# Patient Record
Sex: Female | Born: 1955 | Race: White | Hispanic: Yes | Marital: Married | Smoking: Never smoker
Health system: Southern US, Community
[De-identification: ages and names within clinical notes are randomized; demographics above are authoritative.]

## PROBLEM LIST (undated history)

## (undated) DIAGNOSIS — F419 Anxiety disorder, unspecified: Secondary | ICD-10-CM

## (undated) DIAGNOSIS — E785 Hyperlipidemia, unspecified: Secondary | ICD-10-CM

## (undated) DIAGNOSIS — D649 Anemia, unspecified: Secondary | ICD-10-CM

## (undated) DIAGNOSIS — R112 Nausea with vomiting, unspecified: Secondary | ICD-10-CM

## (undated) DIAGNOSIS — Z9889 Other specified postprocedural states: Secondary | ICD-10-CM

## (undated) DIAGNOSIS — Z9289 Personal history of other medical treatment: Secondary | ICD-10-CM

## (undated) DIAGNOSIS — J189 Pneumonia, unspecified organism: Secondary | ICD-10-CM

## (undated) DIAGNOSIS — M1990 Unspecified osteoarthritis, unspecified site: Secondary | ICD-10-CM

## (undated) DIAGNOSIS — R011 Cardiac murmur, unspecified: Secondary | ICD-10-CM

## (undated) DIAGNOSIS — I10 Essential (primary) hypertension: Secondary | ICD-10-CM

## (undated) DIAGNOSIS — N289 Disorder of kidney and ureter, unspecified: Secondary | ICD-10-CM

## (undated) DIAGNOSIS — E119 Type 2 diabetes mellitus without complications: Secondary | ICD-10-CM

## (undated) HISTORY — PX: BLADDER REPAIR: SHX76

## (undated) HISTORY — PX: CHOLECYSTECTOMY: SHX55

## (undated) HISTORY — DX: Type 2 diabetes mellitus without complications: E11.9

## (undated) HISTORY — DX: Hyperlipidemia, unspecified: E78.5

## (undated) HISTORY — PX: VAGINAL HYSTERECTOMY: SHX2639

## (undated) HISTORY — DX: Essential (primary) hypertension: I10

## (undated) HISTORY — DX: Anemia, unspecified: D64.9

## (undated) HISTORY — DX: Anxiety disorder, unspecified: F41.9

---

## 2018-01-03 ENCOUNTER — Encounter: Payer: Self-pay | Admitting: *Deleted

## 2018-01-04 ENCOUNTER — Encounter: Payer: Self-pay | Admitting: Emergency Medicine

## 2018-01-04 ENCOUNTER — Other Ambulatory Visit: Payer: Self-pay

## 2018-01-04 ENCOUNTER — Ambulatory Visit: Payer: Self-pay | Admitting: Emergency Medicine

## 2018-01-04 VITALS — BP 130/60 | HR 76

## 2018-01-04 DIAGNOSIS — R6889 Other general symptoms and signs: Secondary | ICD-10-CM

## 2018-01-04 DIAGNOSIS — E119 Type 2 diabetes mellitus without complications: Secondary | ICD-10-CM

## 2018-01-04 DIAGNOSIS — E118 Type 2 diabetes mellitus with unspecified complications: Secondary | ICD-10-CM

## 2018-01-04 HISTORY — DX: Type 2 diabetes mellitus without complications: E11.9

## 2018-01-04 LAB — POCT CBC
GRANULOCYTE PERCENT: 86.2 % — AB (ref 37–80)
HEMATOCRIT: 35.4 % — AB (ref 37.7–47.9)
Hemoglobin: 11.2 g/dL — AB (ref 12.2–16.2)
Lymph, poc: 0.9 (ref 0.6–3.4)
MCH: 26.7 pg — AB (ref 27–31.2)
MCHC: 31.8 g/dL (ref 31.8–35.4)
MCV: 84 fL (ref 80–97)
MID (cbc): 0.2 (ref 0–0.9)
MPV: 8.8 fL (ref 0–99.8)
POC GRANULOCYTE: 7.2 — AB (ref 2–6.9)
POC LYMPH %: 11.1 % (ref 10–50)
POC MID %: 2.7 %M (ref 0–12)
Platelet Count, POC: 216 10*3/uL (ref 142–424)
RBC: 4.21 M/uL (ref 4.04–5.48)
RDW, POC: 12.6 %
WBC: 8.3 10*3/uL (ref 4.6–10.2)

## 2018-01-04 LAB — COMPREHENSIVE METABOLIC PANEL
ALBUMIN: 3.6 g/dL (ref 3.6–4.8)
ALK PHOS: 96 IU/L (ref 39–117)
ALT: 11 IU/L (ref 0–32)
AST: 13 IU/L (ref 0–40)
Albumin/Globulin Ratio: 1.3 (ref 1.2–2.2)
BUN / CREAT RATIO: 35 — AB (ref 12–28)
BUN: 31 mg/dL — AB (ref 8–27)
CHLORIDE: 105 mmol/L (ref 96–106)
CO2: 17 mmol/L — ABNORMAL LOW (ref 20–29)
Calcium: 8.4 mg/dL — ABNORMAL LOW (ref 8.7–10.3)
Creatinine, Ser: 0.88 mg/dL (ref 0.57–1.00)
GFR calc Af Amer: 81 mL/min/{1.73_m2} (ref >59)
GFR calc non Af Amer: 71 mL/min/{1.73_m2} (ref >59)
GLUCOSE: 251 mg/dL — AB (ref 65–99)
Globulin, Total: 2.8 g/dL (ref 1.5–4.5)
Potassium: 4.8 mmol/L (ref 3.5–5.2)
SODIUM: 137 mmol/L (ref 134–144)
Total Protein: 6.4 g/dL (ref 6.0–8.5)

## 2018-01-04 LAB — POCT GLYCOSYLATED HEMOGLOBIN (HGB A1C): Hemoglobin A1C: 8.8 % — AB (ref 4.0–5.6)

## 2018-01-04 LAB — GLUCOSE, POCT (MANUAL RESULT ENTRY): POC Glucose: 242 mg/dl — AB (ref 70–99)

## 2018-01-04 MED ORDER — GLIPIZIDE 5 MG PO TABS: 5.0000 mg | ORAL_TABLET | Freq: Every day | ORAL | 2 refills | Status: DC

## 2018-01-04 NOTE — Progress Notes (Signed)
Rachael Burke 62 y.o.   Chief Complaint  Patient presents with  . Eye Problem    x 6 days with itching of both  . Establish Care    HISTORY OF PRESENT ILLNESS: This is a 62 y.o. female complaining of intermittent itching of both eyes for the past 2 years but worse in the past 2 months.  Visual acuity also has been deteriorating.  Patient is diabetic and has been on metformin for 12 years.  Follow-up with doctors in [place] always tell her that her diabetes is out of control.  Patient's capillary blood glucose at home has been between 200-400's.  Denies any other significant symptoms.  HPI   Prior to Admission medications   Medication Sig Start Date End Date Taking? Authorizing Provider  metFORMIN (GLUCOPHAGE) 500 MG tablet Take by mouth 2 (two) times daily with a meal.   Yes [provider]    No Known Allergies  There are no active problems to display for this patient.   No past medical history on file.    Social History   Socioeconomic History  . Marital status: Married    Spouse name: Not on file  . Number of children: Not on file  . Years of education: Not on file  . Highest education level: Not on file  Occupational History  . Not on file  Social Needs  . Financial resource strain: Not on file  . Food insecurity:    Worry: Not on file    Inability: Not on file  . Transportation needs:    Medical: Not on file    Non-medical: Not on file  Tobacco Use  . Smoking status: Not on file  Substance and Sexual Activity  . Alcohol use: Not on file  . Drug use: Not on file  . Sexual activity: Not on file  Lifestyle  . Physical activity:    Days per week: Not on file    Minutes per session: Not on file  . Stress: Not on file  Relationships  . Social connections:    Talks on phone: Not on file    Gets together: Not on file    Attends religious service: Not on file    Active member of club or organization: Not on file    Attends meetings of clubs or  organizations: Not on file    Relationship status: Not on file  . Intimate partner violence:    Fear of current or ex partner: Not on file    Emotionally abused: Not on file    Physically abused: Not on file    Forced sexual activity: Not on file  Other Topics Concern  . Not on file  Social History Narrative  . Not on file    No family history on file.   Review of Systems  Constitutional: Negative.  Negative for chills and fever.  HENT: Negative.  Negative for sore throat.   Eyes: Positive for redness. Negative for photophobia, pain and discharge.       Itchy eyes  Respiratory: Negative.  Negative for cough and shortness of breath.   Cardiovascular: Negative.  Negative for chest pain and palpitations.  Gastrointestinal: Negative for abdominal pain, nausea and vomiting.  Genitourinary: Negative.  Negative for dysuria and hematuria.  Musculoskeletal: Negative for back pain, myalgias and neck pain.  Skin: Negative.  Negative for rash.  Neurological: Negative.   Endo/Heme/Allergies: Negative.   All other systems reviewed and are negative.  Vitals:   01/04/18 0951  BP: 130/60  Pulse: 76     Physical Exam  Constitutional: She is oriented to person, place, and time. She appears well-developed and well-nourished.  HENT:  Head: Normocephalic and atraumatic.  Nose: Nose normal.  Mouth/Throat: Oropharynx is clear and moist.  Eyes: Pupils are equal, round, and reactive to light. Conjunctivae and EOM are normal.  Neck: Normal range of motion. Neck supple. No thyromegaly present.  Cardiovascular: Normal rate, regular rhythm and normal heart sounds.  Pulmonary/Chest: Effort normal.  Abdominal: Soft. Bowel sounds are normal. She exhibits no distension. There is no tenderness.  Musculoskeletal: Normal range of motion. She exhibits no edema.  Lymphadenopathy:    She has no cervical adenopathy.  Neurological: She is alert and oriented to person, place, and time. No sensory  deficit. She exhibits normal muscle tone.  Skin: Skin is warm and dry. Capillary refill takes less than 2 seconds.  Psychiatric: She has a normal mood and affect. Her behavior is normal.  Vitals reviewed.   Results for orders placed or performed in visit on 01/04/18 (from the past 24 hour(s))  POCT glucose (manual entry)     Status: Abnormal   Collection Time: 01/04/18 10:42 AM  Result Value Ref Range   POC Glucose 242 (A) 70 - 99 mg/dl  POCT CBC     Status: Abnormal   Collection Time: 01/04/18 10:42 AM  Result Value Ref Range   WBC 8.3 4.6 - 10.2 K/uL   Lymph, poc 0.9 0.6 - 3.4   POC LYMPH PERCENT 11.1 10 - 50 %L   MID (cbc) 0.2 0 - 0.9   POC MID % 2.7 0 - 12 %M   POC Granulocyte 7.2 (A) 2 - 6.9   Granulocyte percent 86.2 (A) 37 - 80 %G   RBC 4.21 4.04 - 5.48 M/uL   Hemoglobin 11.2 (A) 12.2 - 16.2 g/dL   HCT, POC 35.4 (A) 37.7 - 47.9 %   MCV 84.0 80 - 97 fL   MCH, POC 26.7 (A) 27 - 31.2 pg   MCHC 31.8 31.8 - 35.4 g/dL   RDW, POC 12.6 %   Platelet Count, POC 216 142 - 424 K/uL   MPV 8.8 0 - 99.8 fL  POCT glycosylated hemoglobin (Hb A1C)     Status: Abnormal   Collection Time: 01/04/18 10:46 AM  Result Value Ref Range   Hemoglobin A1C 8.8 (A) 4.0 - 5.6 %   HbA1c POC (<> result, manual entry)  4.0 - 5.6 %   HbA1c, POC (prediabetic range)  5.7 - 6.4 %   HbA1c, POC (controlled diabetic range)  0.0 - 7.0 %   Type 2 diabetes mellitus with complication, without long-term current use of insulin (HCC) Uncontrolled diabetes with elevated hemoglobin A1c at 8.8.  Visual symptoms/problems may be related to this.  Patient taking metformin twice a day.  Will add glipizide 5 mg at breakfast with food.  Advised about hypoglycemic symptoms.  Will follow-up in 4 weeks.   ASSESSMENT & PLAN: Kennisha was seen today for eye problem and establish care.  Diagnoses and all orders for this visit:  Type 2 diabetes mellitus with complication, without long-term current use of insulin (HCC) -     POCT  glucose (manual entry) -     POCT glycosylated hemoglobin (Hb A1C) -     POCT CBC -     Comprehensive metabolic panel -     glipiZIDE (GLUCOTROL) 5 MG tablet; Take 1  tablet (5 mg total) by mouth daily before breakfast.  Itchy eyes    Patient Instructions       IF you received an x-ray today, you will receive an invoice from [facility]. Please contact [facility] Radiology at [phone] with questions or concerns regarding your invoice.   IF you received labwork today, you will receive an invoice from [place]. Please contact [facility] at [phone] with questions or concerns regarding your invoice.   Our billing staff will not be able to assist you with questions regarding bills from these companies.  You will be contacted with the lab results as soon as they are available. The fastest way to get your results is to activate your My Chart account. Instructions are located on the last page of this paperwork. If you have not heard from [place] regarding the results in 2 weeks, please contact this office.     Diabetes mellitus y nutricin Diabetes Mellitus and Nutrition Si sufre de diabetes (diabetes mellitus), es muy importante tener hbitos alimenticios saludables debido a que sus niveles de Designer, television/film set sangre (glucosa) se ven afectados en gran medida por lo que come y bebe. [place] alimentos saludables en las cantidades [place], aproximadamente a la [employer], [place] ayudar a:  Aeronautical engineer glucemia.  Disminuir el riesgo de sufrir una enfermedad cardaca.  Mejorar la presin arterial.  Science writer o mantener un peso saludable.  Todas las personas que sufren de diabetes son diferentes y cada una tiene necesidades diferentes en cuanto a un plan de alimentacin. El mdico puede recomendarle que trabaje con un especialista en dietas y nutricin (nutricionista) para Financial trader plan para usted. Su plan de alimentacin puede variar segn factores como:  Las  caloras que necesita.  Los medicamentos que toma.  Su peso.  Sus niveles de glucemia, presin arterial y colesterol.  Su nivel de [place].  Otras afecciones que tenga, como enfermedades cardacas o renales.  Cmo me afectan los carbohidratos? Los carbohidratos afectan el nivel de glucemia ms que cualquier otro tipo de alimento. La ingesta de carbohidratos naturalmente aumenta la cantidad glucosa en la sangre. El recuento de carbohidratos es un mtodo destinado a Catering manager un registro de la cantidad de carbohidratos que se ingieren. El recuento de carbohidratos es importante para Theatre manager la glucemia a un nivel saludable, en especial si utiliza insulina o toma determinados medicamentos por va oral para la diabetes. Es importante saber la cantidad de carbohidratos que se pueden ingerir en cada comida sin correr Engineer, manufacturing. Esto es Psychologist, forensic. El nutricionista puede ayudarlo a calcular la cantidad de carbohidratos que debe ingerir en cada comida y colacin. Los alimentos que contienen carbohidratos incluyen:  Pan, cereal, arroz, pasta y galletas.  Papas y maz.  Guisantes, frijoles y lentejas.  Leche y Estate agent.  Lambert Mody y [place].  Postres, como pasteles, galletitas, helado y caramelos.  Cmo me afecta el alcohol? El alcohol puede provocar disminuciones sbitas de la glucemia (hipoglucemia), en especial si utiliza insulina o toma determinados medicamentos por va oral para la diabetes. La hipoglucemia es una afeccin potencialmente mortal. Los sntomas de la hipoglucemia (somnolencia, mareos y confusin) son similares a los sntomas de haber consumido demasiado alcohol. Si el mdico afirma que el alcohol es seguro para usted, siga estas pautas:  Limite el consumo de alcohol a no ms de 1 medida por da si es mujer y no est Music therapist, y a 2 medidas si es hombre. Una medida equivale a 12oz (382ml) de  cerveza, 5oz (130ml) de vino o 1oz (27ml) de bebidas de alta  graduacin alcohlica.  No beba con el estmago vaco.  Mantngase hidratado con agua, gaseosas dietticas o t helado sin azcar.  Tenga en cuenta que las gaseosas comunes, los jugos y otros refrescos pueden contener mucha azcar y se deben contar como carbohidratos.  Consejos para seguir Photographer las etiquetas de los alimentos  Comience por controlar el tamao de la porcin en la etiqueta. La cantidad de caloras, carbohidratos, grasas y otros nutrientes mencionados en la etiqueta se basan en una porcin del alimento. Muchos alimentos contienen ms de una porcin por envase.  Verifique la cantidad total de gramos (g) de carbohidratos totales en una porcin. Puede calcular la cantidad de porciones de carbohidratos al dividir el total de carbohidratos por 15. Por ejemplo, si un alimento posee un total de 30g de carbohidratos, equivale a 2 porciones de carbohidratos.  Verifique la cantidad de gramos (g) de grasas saturadas y grasas trans en una porcin. Escoja alimentos que no contengan grasa o que tengan un bajo contenido.  Controle la cantidad de miligramos (mg) de sodio en una porcin. La mayora de las personas deben limitar la ingesta de sodio total a menos de 2300mg  por Training and development officer.  Siempre consulte la informacin nutricional de los alimentos etiquetados como "con bajo contenido de grasa" o "sin grasa". Estos alimentos pueden ser ms altos en azcar agregada o en carbohidratos refinados y deben evitarse.  Hable con el nutricionista para identificar sus objetivos diarios en cuanto a los nutrientes mencionados en la etiqueta. De compras  Evite comprar alimentos procesados, enlatados o prehechos. Estos alimentos tienden a TEFL teacher cantidad de [place], sodio y azcar agregada.  Compre en la zona exterior de la tienda de comestibles. Esta incluye frutas y [employer], granos a granel, carnes frescas y productos lcteos frescos. Coccin  Utilice mtodos de coccin a baja  temperatura, como hornear, en lugar de mtodos de coccin a alta temperatura, como frer en abundante aceite.  Cocine con aceites saludables, como el aceite de [place], canola o [place].  Evite cocinar con manteca, crema o carnes con alto contenido de grasa. Planificacin de las comidas  [employer] comidas y las colaciones de forma regular, preferentemente a la misma hora todos [place]. Evite pasar largos perodos de tiempo sin comer.  Consuma alimentos ricos en fibra, como frutas frescas, verduras, frijoles y cereales integrales. Consulte al nutricionista sobre cuntas porciones de carbohidratos puede consumir en cada comida.  Consuma entre 4 y 6 onzas de protenas magras por da, como carnes [place], pollo, pescado, [employer] o tofu. 1 onza equivale a 1 onza de carne, pollo o pescado, 1 huevo, o 1/4 taza de tofu.  Coma algunos alimentos por da que contengan grasas saludables, como aguacates, frutos secos, semillas y pescado. Estilo de vida   Controle su nivel de glucemia con regularidad.  Haga ejercicio al menos 63minutos, 5das o ms por semana, o como se lo haya indicado el mdico.  Tome los [employer] se lo haya indicado el mdico.  No consuma ningn producto que contenga nicotina o tabaco, como cigarrillos y Psychologist, sport and exercise. Si necesita ayuda para dejar de fumar, consulte al [employer] con un asesor o instructor en diabetes para identificar estrategias para controlar el estrs y cualquier desafo emocional y social. Cules son algunas de las preguntas que puedo hacerle a mi mdico?  Es necesario que me rena con Radio broadcast assistant en diabetes?  Es necesario que me  rena con un nutricionista?  A qu nmero puedo llamar si tengo preguntas?  Cules son los mejores momentos para controlar la glucemia? Dnde encontrar ms informacin:  Asociacin Americana de la Diabetes (American Diabetes Association): diabetes.org/food-and-fitness/food  Academia de  Nutricin y Information systems manager (Academy of Nutrition and Dietetics): PokerClues.dk  [place] Diabetes y [place] y Water quality scientist [facility] of Diabetes and Digestive and Kidney Diseases) ([place], NIH): ContactWire.be Resumen  Un plan de alimentacin saludable lo ayudar a Aeronautical engineer glucemia y Theatre manager un estilo de vida saludable.  Trabajar con un especialista en dietas y nutricin (nutricionista) puede ayudarlo a Insurance claims handler de alimentacin para usted.  Tenga en cuenta que los carbohidratos y el alcohol tienen efectos inmediatos en sus niveles de glucemia. Es importante contar los carbohidratos y consumir alcohol con prudencia. Esta informacin no tiene Marine scientist el consejo del mdico. Asegrese de hacerle al mdico cualquier pregunta que tenga. Document Released: 08/25/2007 Document Revised: 09/07/2016 Document Reviewed: 09/07/2016 Elsevier Interactive Patient Education  2018 Elsevier Inc.      Agustina Caroli, MD [facility]

## 2018-01-04 NOTE — Patient Instructions (Addendum)
IF you received an x-ray today, you will receive an invoice from [facility]iology. Please contact [facility] Radiology at [phone] with questions or concerns regarding your invoice.   IF you received labwork today, you will receive an invoice from [place]. Please contact [facility] at [phone] with questions or concerns regarding your invoice.   Our billing staff will not be able to assist you with questions regarding bills from these companies.  You will be contacted with the lab results as soon as they are available. The fastest way to get your results is to activate your My Chart account. Instructions are located on the last page of this paperwork. If you have not heard from [place] regarding the results in 2 weeks, please contact this office.     Diabetes mellitus y nutricin Diabetes Mellitus and Nutrition Si sufre de diabetes (diabetes mellitus), es muy importante tener hbitos alimenticios saludables debido a que sus niveles de Designer, television/film set sangre (glucosa) se ven afectados en gran medida por lo que come y bebe. [place] alimentos saludables en las cantidades [place], aproximadamente a la [employer], [place] ayudar a:  Aeronautical engineer glucemia.  Disminuir el riesgo de sufrir una enfermedad cardaca.  Mejorar la presin arterial.  Science writer o mantener un peso saludable.  Todas las personas que sufren de diabetes son diferentes y cada una tiene necesidades diferentes en cuanto a un plan de alimentacin. El mdico puede recomendarle que trabaje con un especialista en dietas y nutricin (nutricionista) para Financial trader plan para usted. Su plan de alimentacin puede variar segn factores como:  Las caloras que necesita.  Los medicamentos que toma.  Su peso.  Sus niveles de glucemia, presin arterial y colesterol.  Su nivel de [place].  Otras afecciones que tenga, como enfermedades cardacas o renales.  Cmo me afectan los carbohidratos? Los  carbohidratos afectan el nivel de glucemia ms que cualquier otro tipo de alimento. La ingesta de carbohidratos naturalmente aumenta la cantidad glucosa en la sangre. El recuento de carbohidratos es un mtodo destinado a Catering manager un registro de la cantidad de carbohidratos que se ingieren. El recuento de carbohidratos es importante para Theatre manager la glucemia a un nivel saludable, en especial si utiliza insulina o toma determinados medicamentos por va oral para la diabetes. Es importante saber la cantidad de carbohidratos que se pueden ingerir en cada comida sin correr Engineer, manufacturing. Esto es Psychologist, forensic. El nutricionista puede ayudarlo a calcular la cantidad de carbohidratos que debe ingerir en cada comida y colacin. Los alimentos que contienen carbohidratos incluyen:  Pan, cereal, arroz, pasta y galletas.  Papas y maz.  Guisantes, frijoles y lentejas.  Leche y Estate agent.  Lambert Mody y [place].  Postres, como pasteles, galletitas, helado y caramelos.  Cmo me afecta el alcohol? El alcohol puede provocar disminuciones sbitas de la glucemia (hipoglucemia), en especial si utiliza insulina o toma determinados medicamentos por va oral para la diabetes. La hipoglucemia es una afeccin potencialmente mortal. Los sntomas de la hipoglucemia (somnolencia, mareos y confusin) son similares a los sntomas de haber consumido demasiado alcohol. Si el mdico afirma que el alcohol es seguro para usted, siga estas pautas:  Limite el consumo de alcohol a no ms de 1 medida por da si es mujer y no est Music therapist, y a 2 medidas si es hombre. Una medida equivale a 12oz (368ml) de cerveza, 5oz (160ml) de vino o 1oz (33ml) de bebidas de alta graduacin alcohlica.  No beba con el estmago  vaco.  Mantngase hidratado con agua, gaseosas dietticas o t helado sin azcar.  Tenga en cuenta que las gaseosas comunes, los jugos y otros refrescos pueden contener mucha azcar y se deben contar como  carbohidratos.  Consejos para seguir Photographer las etiquetas de los alimentos  Comience por controlar el tamao de la porcin en la etiqueta. La cantidad de caloras, carbohidratos, grasas y otros nutrientes mencionados en la etiqueta se basan en una porcin del alimento. Muchos alimentos contienen ms de una porcin por envase.  Verifique la cantidad total de gramos (g) de carbohidratos totales en una porcin. Puede calcular la cantidad de porciones de carbohidratos al dividir el total de carbohidratos por 15. Por ejemplo, si un alimento posee un total de 30g de carbohidratos, equivale a 2 porciones de carbohidratos.  Verifique la cantidad de gramos (g) de grasas saturadas y grasas trans en una porcin. Escoja alimentos que no contengan grasa o que tengan un bajo contenido.  Controle la cantidad de miligramos (mg) de sodio en una porcin. La mayora de las personas deben limitar la ingesta de sodio total a menos de 2300mg  por Training and development officer.  Siempre consulte la informacin nutricional de los alimentos etiquetados como "con bajo contenido de grasa" o "sin grasa". Estos alimentos pueden ser ms altos en azcar agregada o en carbohidratos refinados y deben evitarse.  Hable con el nutricionista para identificar sus objetivos diarios en cuanto a los nutrientes mencionados en la etiqueta. De compras  Evite comprar alimentos procesados, enlatados o prehechos. Estos alimentos tienden a TEFL teacher cantidad de [place], sodio y azcar agregada.  Compre en la zona exterior de la tienda de comestibles. Esta incluye frutas y [employer], granos a granel, carnes frescas y productos lcteos frescos. Coccin  Utilice mtodos de coccin a baja temperatura, como hornear, en lugar de mtodos de coccin a alta temperatura, como frer en abundante aceite.  Cocine con aceites saludables, como el aceite de [place], [place] o [place].  Evite cocinar con manteca, crema o carnes con alto contenido de  grasa. Planificacin de las comidas  [employer] comidas y las colaciones de forma regular, preferentemente a la misma hora todos [place]. Evite pasar largos perodos de tiempo sin comer.  Consuma alimentos ricos en fibra, como frutas frescas, verduras, frijoles y cereales integrales. Consulte al nutricionista sobre cuntas porciones de carbohidratos puede consumir en cada comida.  Consuma entre 4 y 6 onzas de protenas magras por da, como carnes [place], pollo, pescado, [employer] o tofu. 1 onza equivale a 1 onza de carne, pollo o pescado, 1 huevo, o 1/4 taza de tofu.  Coma algunos alimentos por da que contengan grasas saludables, como aguacates, frutos secos, semillas y pescado. Estilo de vida   Controle su nivel de glucemia con regularidad.  Haga ejercicio al menos 73minutos, 5das o ms por semana, o como se lo haya indicado el mdico.  Tome los [employer] se lo haya indicado el mdico.  No consuma ningn producto que contenga nicotina o tabaco, como cigarrillos y Psychologist, sport and exercise. Si necesita ayuda para dejar de fumar, consulte al [employer] con un asesor o instructor en diabetes para identificar estrategias para controlar el estrs y cualquier desafo emocional y social. Cules son algunas de las preguntas que puedo hacerle a mi mdico?  Es necesario que me rena con Radio broadcast assistant en diabetes?  Es necesario que me rena con un nutricionista?  A qu nmero puedo llamar si tengo preguntas?  Cules son los mejores momentos para  controlar la glucemia? Dnde encontrar ms informacin:  Asociacin Americana de la Diabetes (American Diabetes Association): diabetes.org/food-and-fitness/food  Academia de Nutricin y Information systems manager (Academy of Nutrition and Dietetics): PokerClues.dk  [place] Diabetes y [place] y Water quality scientist [facility] of Diabetes and Digestive and  Kidney Diseases) ([place], NIH): ContactWire.be Resumen  Un plan de alimentacin saludable lo ayudar a Aeronautical engineer glucemia y Theatre manager un estilo de vida saludable.  Trabajar con un especialista en dietas y nutricin (nutricionista) puede ayudarlo a Insurance claims handler de alimentacin para usted.  Tenga en cuenta que los carbohidratos y el alcohol tienen efectos inmediatos en sus niveles de glucemia. Es importante contar los carbohidratos y consumir alcohol con prudencia. Esta informacin no tiene Marine scientist el consejo del mdico. Asegrese de hacerle al mdico cualquier pregunta que tenga. Document Released: 08/25/2007 Document Revised: 09/07/2016 Document Reviewed: 09/07/2016 Elsevier Interactive Patient Education  2018 [employer].

## 2018-01-04 NOTE — Assessment & Plan Note (Signed)
Uncontrolled diabetes with elevated hemoglobin A1c at 8.8.  Visual symptoms/problems may be related to this.  Patient taking metformin twice a day.  Will add glipizide 5 mg at breakfast with food.  Advised about hypoglycemic symptoms.  Will follow-up in 4 weeks.

## 2018-01-06 ENCOUNTER — Encounter: Payer: Self-pay | Admitting: *Deleted

## 2018-01-06 NOTE — Progress Notes (Signed)
Letter sent.

## 2018-02-09 ENCOUNTER — Ambulatory Visit: Payer: Self-pay | Admitting: Emergency Medicine

## 2019-04-06 ENCOUNTER — Ambulatory Visit (INDEPENDENT_AMBULATORY_CARE_PROVIDER_SITE_OTHER): Payer: Self-pay | Admitting: Emergency Medicine

## 2019-04-06 ENCOUNTER — Encounter: Payer: Self-pay | Admitting: Emergency Medicine

## 2019-04-06 ENCOUNTER — Other Ambulatory Visit: Payer: Self-pay

## 2019-04-06 VITALS — BP 164/74 | HR 71 | Temp 98.7°F | Resp 16 | Ht 63.0 in | Wt 148.0 lb

## 2019-04-06 DIAGNOSIS — I10 Essential (primary) hypertension: Secondary | ICD-10-CM

## 2019-04-06 DIAGNOSIS — H8111 Benign paroxysmal vertigo, right ear: Secondary | ICD-10-CM

## 2019-04-06 DIAGNOSIS — E1159 Type 2 diabetes mellitus with other circulatory complications: Secondary | ICD-10-CM

## 2019-04-06 DIAGNOSIS — I152 Hypertension secondary to endocrine disorders: Secondary | ICD-10-CM

## 2019-04-06 LAB — POCT GLYCOSYLATED HEMOGLOBIN (HGB A1C): Hemoglobin A1C: 6.7 % — AB (ref 4.0–5.6)

## 2019-04-06 LAB — GLUCOSE, POCT (MANUAL RESULT ENTRY): POC Glucose: 135 mg/dl — AB (ref 70–99)

## 2019-04-06 MED ORDER — ATORVASTATIN CALCIUM 20 MG PO TABS: 20.0000 mg | ORAL_TABLET | Freq: Every day | ORAL | 3 refills | Status: DC

## 2019-04-06 MED ORDER — LISINOPRIL 10 MG PO TABS: 10.0000 mg | ORAL_TABLET | Freq: Every day | ORAL | 3 refills | Status: DC

## 2019-04-06 MED ORDER — GLIPIZIDE 5 MG PO TABS: 5.0000 mg | ORAL_TABLET | Freq: Every day | ORAL | 3 refills | Status: DC

## 2019-04-06 MED ORDER — MECLIZINE HCL 25 MG PO TABS: 25.0000 mg | ORAL_TABLET | Freq: Three times a day (TID) | ORAL | 0 refills | Status: DC | PRN

## 2019-04-06 NOTE — Progress Notes (Signed)
Rachael Burke 63 y.o.   Chief Complaint  Patient presents with  . Ear Pain    RIGHT x 4 months down the neck  . Dizziness    x 4 months    HISTORY OF PRESENT ILLNESS: This is a 63 y.o. female diabetic complaining of vertigo for 4 months.  Feels off balance with intermittent nausea and vomiting.  Was in [place] and was seen by doctors who diagnosed her with vertigo and started on oral medication that partially helped.  Was also started on blood pressure medication, irbesartan, that she is no longer taking due to side effects.  Has also been off glipizide for a while now after she ran out of medication.  Patient not very compliant with medications.  Also complaining of pain to right side of the neck that starts behind the ear and moves all the way down to right shoulder area.  No other significant symptoms.  HPI   Prior to Admission medications   Medication Sig Start Date End Date Taking? Authorizing Provider  metFORMIN (GLUCOPHAGE) 500 MG tablet Take by mouth 2 (two) times daily with a meal.   Yes [provider]  glipiZIDE (GLUCOTROL) 5 MG tablet Take 1 tablet (5 mg total) by mouth daily before breakfast. 01/04/18 02/03/18  Horald Pollen, MD  lisinopril (ZESTRIL) 10 MG tablet Take 1 tablet (10 mg total) by mouth daily. 04/06/19   Horald Pollen, MD    No Known Allergies  Patient Active Problem List   Diagnosis Date Noted  . Type 2 diabetes mellitus with complication, without long-term current use of insulin ([place]) 01/04/2018    Past Medical History:  Diagnosis Date  . Anemia   . Anxiety   . Diabetes mellitus without complication [facility])     Past Surgical History:  Procedure Laterality Date  . CESAREAN SECTION      Social History   Socioeconomic History  . Marital status: Married    Spouse name: Not on file  . Number of children: Not on file  . Years of education: Not on file  . Highest education level: Not on file  Occupational History  . Not on  file  Social Needs  . Financial resource strain: Not on file  . Food insecurity    Worry: Not on file    Inability: Not on file  . Transportation needs    Medical: Not on file    Non-medical: Not on file  Tobacco Use  . Smoking status: Never Smoker  . Smokeless tobacco: Never Used  Substance and Sexual Activity  . Alcohol use: Never    Frequency: Never  . Drug use: Never  . Sexual activity: Not on file  Lifestyle  . Physical activity    Days per week: Not on file    Minutes per session: Not on file  . Stress: Not on file  Relationships  . Social Herbalist on phone: Not on file    Gets together: Not on file    Attends religious service: Not on file    Active member of club or organization: Not on file    Attends meetings of clubs or organizations: Not on file    Relationship status: Not on file  . Intimate partner violence    Fear of current or ex partner: Not on file    Emotionally abused: Not on file    Physically abused: Not on file    Forced sexual activity: Not on file  Other Topics Concern  . Not on file  Social History Narrative  . Not on file    Family History  Problem Relation Age of Onset  . Diabetes Mother   . Hypertension Father   . Diabetes Sister   . Diabetes Brother   . Diabetes Brother      Review of Systems  Constitutional: Negative.  Negative for chills and fever.  HENT: Negative.  Negative for congestion, ear pain, hearing loss, nosebleeds and sore throat.   Eyes: Negative.  Negative for blurred vision and double vision.  Respiratory: Negative.  Negative for cough and shortness of breath.   Cardiovascular: Negative.  Negative for chest pain and palpitations.  Gastrointestinal: Positive for nausea and vomiting. Negative for abdominal pain, blood in stool and diarrhea.  Genitourinary: Negative.  Negative for dysuria and hematuria.  Musculoskeletal: Negative.  Negative for myalgias and neck pain.  Skin: Negative.  Negative for  rash.  Neurological: Positive for dizziness. Negative for focal weakness, seizures, loss of consciousness and headaches.  All other systems reviewed and are negative.  Today's Vitals   04/06/19 1039  BP: (!) 164/74  Pulse: 71  Resp: 16  Temp: 98.7 F (37.1 C)  TempSrc: Oral  SpO2: 100%  Weight: 148 lb (67.1 kg)  Height: 5\' 3"  (1.6 m)   Body mass index is 26.22 kg/m.   Physical Exam Vitals signs reviewed.  Constitutional:      Appearance: Normal appearance.  HENT:     Head: Normocephalic.     Right Ear: Tympanic membrane, ear canal and external ear normal.     Left Ear: Tympanic membrane, ear canal and external ear normal.     Mouth/Throat:     Mouth: Mucous membranes are moist.     Pharynx: Oropharynx is clear.  Eyes:     Extraocular Movements: Extraocular movements intact.     Conjunctiva/sclera: Conjunctivae normal.     Pupils: Pupils are equal, round, and reactive to light.  Neck:     Musculoskeletal: Normal range of motion and neck supple.  Cardiovascular:     Rate and Rhythm: Normal rate and regular rhythm.     Heart sounds: Normal heart sounds.  Pulmonary:     Effort: Pulmonary effort is normal.     Breath sounds: Normal breath sounds.  Musculoskeletal: Normal range of motion.  Skin:    General: Skin is warm and dry.     Capillary Refill: Capillary refill takes less than 2 seconds.  Neurological:     General: No focal deficit present.     Mental Status: She is alert and oriented to person, place, and time.     Cranial Nerves: No cranial nerve deficit.     Sensory: No sensory deficit.     Motor: No weakness.     Coordination: Coordination normal.     Gait: Gait normal.  Psychiatric:        Mood and Affect: Mood normal.        Behavior: Behavior normal.    Results for orders placed or performed in visit on 04/06/19 (from the past 24 hour(s))  POCT glucose (manual entry)     Status: Abnormal   Collection Time: 04/06/19 11:32 AM  Result Value Ref Range    POC Glucose 135 (A) 70 - 99 mg/dl  POCT glycosylated hemoglobin (Hb A1C)     Status: Abnormal   Collection Time: 04/06/19 11:40 AM  Result Value Ref Range   Hemoglobin A1C 6.7 (A) 4.0 -  5.6 %   HbA1c POC (<> result, manual entry)     HbA1c, POC (prediabetic range)     HbA1c, POC (controlled diabetic range)       ASSESSMENT & PLAN: Hypertension associated with diabetes ([place]) Improved diabetes with hemoglobin A1c at 6.7.  Continue metformin and glipizide.  Diet and nutrition.  Meclizine for vertigo.  CBC and CMP with lipid profile done today.  Follow-up in 4 weeks.  [place] was seen today for ear pain and dizziness.  Diagnoses and all orders for this visit:  Hypertension associated with diabetes ([place]) -     CBC with Differential -     Comprehensive metabolic panel -     Lipid panel -     POCT glucose (manual entry) -     POCT glycosylated hemoglobin (Hb A1C) -     lisinopril (ZESTRIL) 10 MG tablet; Take 1 tablet (10 mg total) by mouth daily. -     glipiZIDE (GLUCOTROL) 5 MG tablet; Take 1 tablet (5 mg total) by mouth daily before breakfast. -     atorvastatin (LIPITOR) 20 MG tablet; Take 1 tablet (20 mg total) by mouth daily.  Benign paroxysmal positional vertigo of right ear -     meclizine (ANTIVERT) 25 MG tablet; Take 1 tablet (25 mg total) by mouth 3 (three) times daily as needed for dizziness.    Patient Instructions       If you have lab work done today you will be contacted with your lab results within the next 2 weeks.  If you have not heard from [place] then please contact us. The fastest way to get your results is to register for My Chart.   IF you received an x-ray today, you will receive an invoice from [facility]. Please contact [facility] at [phone] with questions or concerns regarding your invoice.   IF you received labwork today, you will receive an invoice from [facility]. Please contact [facility] at [phone] with questions or concerns  regarding your invoice.   Our billing staff will not be able to assist you with questions regarding bills from these companies.  You will be contacted with the lab results as soon as they are available. The fastest way to get your results is to activate your My Chart account. Instructions are located on the last page of this paperwork. If you have not heard from [place] regarding the results in 2 weeks, please contact this office.     Diabetes mellitus y nutricin, en adultos Diabetes Mellitus and Nutrition, Adult Si sufre de diabetes (diabetes mellitus), es muy importante tener hbitos alimenticios saludables debido a que sus niveles de Designer, television/film set sangre (glucosa) se ven afectados en gran medida por lo que come y bebe. [place] alimentos saludables en las cantidades [place], aproximadamente a la [employer], [place] ayudar a:  Aeronautical engineer glucemia.  Disminuir el riesgo de sufrir una enfermedad cardaca.  Mejorar la presin arterial.  Science writer o mantener un peso saludable. Todas las personas que sufren de diabetes son diferentes y cada una tiene necesidades diferentes en cuanto a un plan de alimentacin. El mdico puede recomendarle que trabaje con un especialista en dietas y nutricin (nutricionista) para Financial trader plan para usted. Su plan de alimentacin puede variar segn factores como:  Las caloras que necesita.  Los medicamentos que toma.  Su peso.  Sus niveles de glucemia, presin arterial y colesterol.  Su nivel de [place].  Otras afecciones que tenga, [place]  enfermedades cardacas o renales. Cmo me afectan los carbohidratos? Los carbohidratos, o hidratos de carbono, afectan su nivel de glucemia ms que cualquier otro tipo de alimento. La ingesta de carbohidratos naturalmente aumenta la cantidad de [employer]. El recuento de carbohidratos es un mtodo destinado a Catering manager un registro de la cantidad de carbohidratos que se consumen. El recuento de  carbohidratos es importante para Theatre manager la glucemia a un nivel saludable, especialmente si utiliza insulina o toma determinados medicamentos por va oral para la diabetes. Es importante conocer la cantidad de carbohidratos que se pueden ingerir en cada comida sin correr Engineer, manufacturing. Esto es Psychologist, forensic. Su nutricionista puede ayudarlo a calcular la cantidad de carbohidratos que debe ingerir en cada comida y en cada refrigerio. Entre los alimentos que contienen carbohidratos, se incluyen:  Pan, cereal, arroz, pastas y galletas.  Papas y maz.  Guisantes, frijoles y lentejas.  Leche y Estate agent.  Lambert Mody y [place].  Postres, como pasteles, galletas, helado y caramelos. Cmo me afecta el alcohol? El alcohol puede provocar disminuciones sbitas de la glucemia (hipoglucemia), especialmente si utiliza insulina o toma determinados medicamentos por va oral para la diabetes. La hipoglucemia es una afeccin potencialmente mortal. Los sntomas de la hipoglucemia (somnolencia, mareos y confusin) son similares a los sntomas de haber consumido demasiado alcohol. Si el mdico afirma que el alcohol es seguro para usted, [place] estas pautas:  Limite el consumo de alcohol a no ms de 90medida por da si es mujer y no est [place], y a 73medidas si es hombre. Una medida equivale a 12oz (383ml) de cerveza, 5oz (136ml) de vino o 1oz (72ml) de bebidas alcohlicas de alta graduacin.  No beba con el estmago vaco.  Mantngase hidratado bebiendo agua, refrescos dietticos o t helado sin azcar.  Tenga en cuenta que los refrescos comunes, los jugos y otras bebida para Optician, dispensing pueden contener mucha azcar y se deben contar como carbohidratos. Cules son algunos consejos para seguir este plan?  Leer las etiquetas de los alimentos  Comience por leer el tamao de la porcin en la "Informacin nutricional" en las etiquetas de los alimentos envasados y las bebidas. La cantidad de caloras,  carbohidratos, grasas y otros nutrientes mencionados en la etiqueta se basan en una porcin del alimento. Muchos alimentos contienen ms de una porcin por envase.  Verifique la cantidad total de gramos (g) de carbohidratos totales en una porcin. Puede calcular la cantidad de porciones de carbohidratos al dividir el total de carbohidratos por 15. Por ejemplo, si un alimento tiene un total de 30g de carbohidratos, equivale a 2 porciones de carbohidratos.  Verifique la cantidad de gramos (g) de grasas saturadas y grasas trans en una porcin. Escoja alimentos que no contengan grasa o que tengan un bajo contenido.  Verifique la cantidad de miligramos (mg) de sal (sodio) en una porcin. La [employer] de las personas deben limitar la ingesta de sodio total a menos de 2300mg  por Training and development officer.  Siempre consulte la informacin nutricional de los alimentos etiquetados como "con bajo contenido de grasa" o "sin grasa". Estos alimentos pueden tener un mayor contenido de Location manager agregada o carbohidratos refinados, y deben evitarse.  Hable con su nutricionista para identificar sus objetivos diarios en cuanto a los nutrientes mencionados en la etiqueta. Al ir de compras  Evite comprar alimentos procesados, enlatados o precocinados. Estos alimentos tienden a Special educational needs teacher mayor cantidad de [place], sodio y azcar agregada.  Compre en la zona exterior de la tienda de comestibles. Esta  zona incluye frutas y verduras frescas, granos a granel, carnes frescas y productos lcteos frescos. Al cocinar  Utilice mtodos de coccin a baja temperatura, como hornear, en lugar de mtodos de coccin a alta temperatura, como frer en abundante aceite.  Cocine con aceites saludables, como el aceite de [place], canola o [place].  Evite cocinar con manteca, crema o carnes con alto contenido de grasa. Planificacin de las comidas  Coma las comidas y los refrigerios regularmente, preferentemente a la misma hora todos [place]. Evite pasar largos  perodos de tiempo sin comer.  Consuma alimentos ricos en fibra, como frutas frescas, verduras, frijoles y cereales integrales. Consulte a su nutricionista sobre cuntas porciones de carbohidratos puede consumir en cada comida.  Consuma entre 4 y 6 onzas (oz) de protenas magras por da, como carnes [place], pollo, pescado, huevos o tofu. Una onza de protena magra equivale a: ? 1 onza de carne, pollo o pescado. ? 1huevo. ?  taza de tofu.  Coma algunos alimentos por da que contengan grasas saludables, como aguacates, frutos secos, semillas y pescado. Estilo de vida  Controle su nivel de glucemia con regularidad.  Haga actividad fsica habitualmente como se lo haya indicado el mdico. Esto puede incluir lo siguiente: ? 173minutos semanales de ejercicio de intensidad moderada o alta. Esto podra incluir caminatas dinmicas, ciclismo o gimnasia acutica. ? Realizar ejercicios de elongacin y de fortalecimiento, como yoga o levantamiento de pesas, por lo menos 2veces por semana.  Tome los [employer] se lo haya indicado el mdico.  No consuma ningn producto que contenga nicotina o tabaco, como cigarrillos y Psychologist, sport and exercise. Si necesita ayuda para dejar de fumar, consulte al [employer] con un asesor o instructor en diabetes para identificar estrategias para controlar el estrs y cualquier desafo emocional y social. Preguntas para hacerle al mdico  Es necesario que consulte a Radio broadcast assistant en el cuidado de la diabetes?  Es necesario que me rena con un nutricionista?  A qu nmero puedo llamar si tengo preguntas?  Cules son los mejores momentos para controlar la glucemia? Dnde encontrar ms informacin:  Asociacin Estadounidense de la Diabetes (American Diabetes Association): diabetes.org  Academia de Nutricin y Information systems manager (Academy of Nutrition and Dietetics): www.eatright.[place] Diabetes y las Enfermedades Digestivas y [place]  [facility] of Diabetes and Digestive and Kidney Diseases, NIH): [employer]eral.dk Resumen  Un plan de alimentacin saludable lo ayudar a Aeronautical engineer glucemia y Theatre manager un estilo de vida saludable.  Trabajar con un especialista en dietas y nutricin (nutricionista) puede ayudarlo a Insurance claims handler de alimentacin para usted.  Tenga en cuenta que los carbohidratos (hidratos de carbono) y el alcohol tienen efectos inmediatos en sus niveles de glucemia. Es importante contar los carbohidratos que ingiere y consumir alcohol con prudencia. Esta informacin no tiene Marine scientist el consejo del mdico. Asegrese de hacerle al mdico cualquier pregunta que tenga. Document Released: 08/25/2007 Document Revised: 01/26/2017 Document Reviewed: 09/07/2016 Elsevier Patient Education  [place] Vertigo El vrtigo es la sensacin de que usted o las cosas que lo rodean se mueven cuando en realidad eso no sucede. Esta sensacin puede aparecer y desaparecer en cualquier momento. El vrtigo suele desaparecer solo. Esta afeccin puede ser peligrosa si ocurre cuando est realizando actividades como conducir o trabajar con mquinas. El mdico le har pruebas para encontrar la causa del vrtigo. Estas pruebas tambin ayudarn al mdico a decidir cul es el mejor tratamiento para usted. Siga estas  indicaciones en su casa: Comida y bebida      Beba suficiente lquido para mantener el pis (la [place]) de color amarillo plido.  No beba alcohol. Actividad  Retome sus actividades habituales como se lo haya indicado el mdico. Pregntele al mdico qu actividades son seguras para usted.  Por la maana, sintese primero a un lado de la cama. Cuando se sienta bien, pngase lentamente de pie mientras se sostiene de algo, hasta que sepa que ha logrado el equilibrio.  Muvase lentamente. Evite determinadas posiciones o hacer movimientos repentinos del cuerpo o de la cabeza, como  se lo haya indicado el mdico.  Use un bastn si tiene dificultad para ponerse de pie o caminar.  Si se siente mareado, sintese de inmediato.  Evite realizar tareas o actividades que puedan causarle peligro a usted o a Producer, television/film/video si se marea.  Evite agacharse si se siente mareado. En su casa, coloque los objetos de modo que le resulte fcil alcanzarlos sin Office manager.  No conduzca vehculos ni opere maquinaria pesada si se siente mareado. Indicaciones generales  [employer] de venta libre y los recetados solamente como se lo haya indicado el mdico.  Consulting civil engineer a todas las visitas de control como se lo haya indicado el mdico. Esto es importante. Comunquese con un mdico si:  Los medicamentos no le [employer] vrtigo.  Tiene fiebre.  Los problemas empeoran o le aparecen sntomas nuevos.  Sus familiares o amigos observan cambios en su comportamiento.  La sensacin de Air cabin crew.  Los vmitos empeoran.  Pierde la sensibilidad (tiene entumecimiento) en alguna parte del cuerpo.  Siente pinchazos u hormigueo en alguna parte del cuerpo. Solicite ayuda inmediatamente si:  Tiene dificultad para moverse o para caminar.  Esta mareado todo [employer].  Pierde el conocimiento (se desmaya).  Tiene dolores de [place] muy intensos.  Siente debilidad [employer], los brazos o las piernas.  Tiene cambios en la audicin.  Tiene cambios en la forma de ver (visin).  Tiene rigidez en el cuello.  La luz brillante empieza a molestarlo. Resumen  El vrtigo es la sensacin de que usted o las cosas que lo rodean se mueven cuando en realidad eso no sucede.  El mdico le har pruebas para encontrar la causa del vrtigo.  Tal vez le indiquen que evite algunas tareas, posiciones o movimientos.  Comunquese con un mdico si los medicamentos no lo ayudan o si tiene fiebre, sntomas nuevos o un cambio en el comportamiento.  Pida ayuda de inmediato si tiene  dolores de cabeza muy intensos o si tiene cambios en la manera de hablar, or o ver. Esta informacin no tiene Marine scientist el consejo del mdico. Asegrese de hacerle al mdico cualquier pregunta que tenga. Document Released: 06/20/2010 Document Revised: 06/06/2018 Document Reviewed: 06/06/2018 Elsevier Patient Education  2020 Elsevier Inc.      Agustina Caroli, MD [facility]

## 2019-04-06 NOTE — Assessment & Plan Note (Signed)
Improved diabetes with hemoglobin A1c at 6.7.  Continue metformin and glipizide.  Diet and nutrition.  Meclizine for vertigo.  CBC and CMP with lipid profile done today.  Follow-up in 4 weeks.

## 2019-04-06 NOTE — Patient Instructions (Addendum)
   If you have lab work done today you will be contacted with your lab results within the next 2 weeks.  If you have not heard from us then please contact us. The fastest way to get your results is to register for My Chart.   IF you received an x-ray today, you will receive an invoice from [facility]. Please contact  Radiology at [phone] with questions or concerns regarding your invoice.   IF you received labwork today, you will receive an invoice from [facility]. Please contact [facility] at [phone] with questions or concerns regarding your invoice.   Our billing staff will not be able to assist you with questions regarding bills from these companies.  You will be contacted with the lab results as soon as they are available. The fastest way to get your results is to activate your My Chart account. Instructions are located on the last page of this paperwork. If you have not heard from us regarding the results in 2 weeks, please contact this office.     Diabetes mellitus y nutricin, en adultos Diabetes Mellitus and Nutrition, Adult Si sufre de diabetes (diabetes mellitus), es muy importante tener hbitos alimenticios saludables debido a que sus niveles de azcar en la sangre (glucosa) se ven afectados en gran medida por lo que come y bebe. [place] alimentos saludables en las cantidades adecuadas, aproximadamente a la misma hora todos los das, lo ayudar a:  Controlar la glucemia.  Disminuir el riesgo de sufrir una enfermedad cardaca.  Mejorar la presin arterial.  Alcanzar o mantener un peso saludable. Todas las personas que sufren de diabetes son diferentes y cada una tiene necesidades diferentes en cuanto a un plan de alimentacin. El mdico puede recomendarle que trabaje con un especialista en dietas y nutricin (nutricionista) para elaborar el mejor plan para usted. Su plan de alimentacin puede variar segn factores como:  Las caloras que  necesita.  Los medicamentos que toma.  Su peso.  Sus niveles de glucemia, presin arterial y colesterol.  Su nivel de actividad.  Otras afecciones que tenga, como enfermedades cardacas o renales. Cmo me afectan los carbohidratos? Los carbohidratos, o hidratos de carbono, afectan su nivel de glucemia ms que cualquier otro tipo de alimento. La ingesta de carbohidratos naturalmente aumenta la cantidad de glucosa en la sangre. El recuento de carbohidratos es un mtodo destinado a llevar un registro de la cantidad de carbohidratos que se consumen. El recuento de carbohidratos es importante para mantener la glucemia a un nivel saludable, especialmente si utiliza insulina o toma determinados medicamentos por va oral para la diabetes. Es importante conocer la cantidad de carbohidratos que se pueden ingerir en cada comida sin correr ningn riesgo. Esto es diferente en cada persona. Su nutricionista puede ayudarlo a calcular la cantidad de carbohidratos que debe ingerir en cada comida y en cada refrigerio. Entre los alimentos que contienen carbohidratos, se incluyen:  Pan, cereal, arroz, pastas y galletas.  Papas y maz.  Guisantes, frijoles y lentejas.  Leche y yogur.  Frutas y jugo.  Postres, como pasteles, galletas, helado y caramelos. Cmo me afecta el alcohol? El alcohol puede provocar disminuciones sbitas de la glucemia (hipoglucemia), especialmente si utiliza insulina o toma determinados medicamentos por va oral para la diabetes. La hipoglucemia es una afeccin potencialmente mortal. Los sntomas de la hipoglucemia (somnolencia, mareos y confusin) son similares a los sntomas de haber consumido demasiado alcohol. Si el mdico afirma que el alcohol es seguro para usted, siga estas pautas:    Limite el consumo de alcohol a no ms de 1medida por da si es mujer y no est embarazada, y a 2medidas si es hombre. Una medida equivale a 12oz (355ml) de cerveza, 5oz (148ml) de vino o  1oz (44ml) de bebidas alcohlicas de alta graduacin.  No beba con el estmago vaco.  Mantngase hidratado bebiendo agua, refrescos dietticos o t helado sin azcar.  Tenga en cuenta que los refrescos comunes, los jugos y otras bebida para mezclar pueden contener mucha azcar y se deben contar como carbohidratos. Cules son algunos consejos para seguir este plan?  Leer las etiquetas de los alimentos  Comience por leer el tamao de la porcin en la "Informacin nutricional" en las etiquetas de los alimentos envasados y las bebidas. La cantidad de caloras, carbohidratos, grasas y otros nutrientes mencionados en la etiqueta se basan en una porcin del alimento. Muchos alimentos contienen ms de una porcin por envase.  Verifique la cantidad total de gramos (g) de carbohidratos totales en una porcin. Puede calcular la cantidad de porciones de carbohidratos al dividir el total de carbohidratos por 15. Por ejemplo, si un alimento tiene un total de 30g de carbohidratos, equivale a 2 porciones de carbohidratos.  Verifique la cantidad de gramos (g) de grasas saturadas y grasas trans en una porcin. Escoja alimentos que no contengan grasa o que tengan un bajo contenido.  Verifique la cantidad de miligramos (mg) de sal (sodio) en una porcin. La mayora de las personas deben limitar la ingesta de sodio total a menos de 2300mg por da.  Siempre consulte la informacin nutricional de los alimentos etiquetados como "con bajo contenido de grasa" o "sin grasa". Estos alimentos pueden tener un mayor contenido de azcar agregada o carbohidratos refinados, y deben evitarse.  Hable con su nutricionista para identificar sus objetivos diarios en cuanto a los nutrientes mencionados en la etiqueta. Al ir de compras  Evite comprar alimentos procesados, enlatados o precocinados. Estos alimentos tienden a tener una mayor cantidad de grasa, sodio y azcar agregada.  Compre en la zona exterior de la tienda de  comestibles. Esta zona incluye frutas y verduras frescas, granos a granel, carnes frescas y productos lcteos frescos. Al cocinar  Utilice mtodos de coccin a baja temperatura, como hornear, en lugar de mtodos de coccin a alta temperatura, como frer en abundante aceite.  Cocine con aceites saludables, como el aceite de oliva, canola o girasol.  Evite cocinar con manteca, crema o carnes con alto contenido de grasa. Planificacin de las comidas  Coma las comidas y los refrigerios regularmente, preferentemente a la misma hora todos los das. Evite pasar largos perodos de tiempo sin comer.  Consuma alimentos ricos en fibra, como frutas frescas, verduras, frijoles y cereales integrales. Consulte a su nutricionista sobre cuntas porciones de carbohidratos puede consumir en cada comida.  Consuma entre 4 y 6 onzas (oz) de protenas magras por da, como carnes magras, pollo, pescado, huevos o tofu. Una onza de protena magra equivale a: ? 1 onza de carne, pollo o pescado. ? 1huevo. ?  taza de tofu.  Coma algunos alimentos por da que contengan grasas saludables, como aguacates, frutos secos, semillas y pescado. Estilo de vida  Controle su nivel de glucemia con regularidad.  Haga actividad fsica habitualmente como se lo haya indicado el mdico. Esto puede incluir lo siguiente: ? 150minutos semanales de ejercicio de intensidad moderada o alta. Esto podra incluir caminatas dinmicas, ciclismo o gimnasia acutica. ? Realizar ejercicios de elongacin y de fortalecimiento, como yoga o levantamiento   de pesas, por lo menos 2veces por semana.  Tome los [employer] se lo haya indicado el mdico.  No consuma ningn producto que contenga nicotina o tabaco, como cigarrillos y Psychologist, sport and exercise. Si necesita ayuda para dejar de fumar, consulte al [employer] con un asesor o instructor en diabetes para identificar estrategias para controlar el estrs y cualquier desafo emocional  y social. Preguntas para hacerle al mdico  Es necesario que consulte a Radio broadcast assistant en el cuidado de la diabetes?  Es necesario que me rena con un nutricionista?  A qu nmero puedo llamar si tengo preguntas?  Cules son los mejores momentos para controlar la glucemia? Dnde encontrar ms informacin:  Asociacin Estadounidense de la Diabetes (American Diabetes Association): diabetes.org  Academia de Nutricin y Information systems manager (Academy of Nutrition and Dietetics): www.eatright.[place] Diabetes y las Enfermedades Digestivas y [facility] of Diabetes and Digestive and Kidney Diseases, NIH): [employer] Resumen  Un plan de alimentacin saludable lo ayudar a Aeronautical engineer glucemia y Theatre manager un estilo de vida saludable.  Trabajar con un especialista en dietas y nutricin (nutricionista) puede ayudarlo a Insurance claims handler de alimentacin para usted.  Tenga en cuenta que los carbohidratos (hidratos de carbono) y el alcohol tienen efectos inmediatos en sus niveles de glucemia. Es importante contar los carbohidratos que ingiere y consumir alcohol con prudencia. Esta informacin no tiene Marine scientist el consejo del mdico. Asegrese de hacerle al mdico cualquier pregunta que tenga. Document Released: 08/25/2007 Document Revised: 01/26/2017 Document Reviewed: 09/07/2016 Elsevier Patient Education  [place] El vrtigo es la sensacin de que usted o las cosas que lo rodean se mueven cuando en realidad eso no sucede. Esta sensacin puede aparecer y desaparecer en cualquier momento. El vrtigo suele desaparecer solo. Esta afeccin puede ser peligrosa si ocurre cuando est realizando actividades como conducir o trabajar con mquinas. El mdico le har pruebas para encontrar la causa del vrtigo. Estas pruebas tambin ayudarn al mdico a decidir cul es el mejor tratamiento para usted. Siga estas indicaciones en  su casa: Comida y bebida      Beba suficiente lquido para mantener el pis (la [place]) de color amarillo plido.  No beba alcohol. Actividad  Retome sus actividades habituales como se lo haya indicado el mdico. Pregntele al mdico qu actividades son seguras para usted.  Por la maana, sintese primero a un lado de la cama. Cuando se sienta bien, pngase lentamente de pie mientras se sostiene de algo, hasta que sepa que ha logrado el equilibrio.  Muvase lentamente. Evite determinadas posiciones o hacer movimientos repentinos del cuerpo o de la cabeza, como se lo haya indicado el mdico.  Use un bastn si tiene dificultad para ponerse de pie o caminar.  Si se siente mareado, sintese de inmediato.  Evite realizar tareas o actividades que puedan causarle peligro a usted o a Producer, television/film/video si se marea.  Evite agacharse si se siente mareado. En su casa, coloque los objetos de modo que le resulte fcil alcanzarlos sin Office manager.  No conduzca vehculos ni opere maquinaria pesada si se siente mareado. Indicaciones generales  [employer] de venta libre y los recetados solamente como se lo haya indicado el mdico.  Consulting civil engineer a todas las visitas de control como se lo haya indicado el mdico. Esto es importante. Comunquese con un mdico si:  Los medicamentos no le [employer] vrtigo.  Tiene fiebre.  Los problemas empeoran o le aparecen  sntomas nuevos.  Sus familiares o amigos observan cambios en su comportamiento.  La sensacin de Air cabin crew.  Los vmitos empeoran.  Pierde la sensibilidad (tiene entumecimiento) en alguna parte del cuerpo.  Siente pinchazos u hormigueo en alguna parte del cuerpo. Solicite ayuda inmediatamente si:  Tiene dificultad para moverse o para caminar.  Esta mareado todo [employer].  Pierde el conocimiento (se desmaya).  Tiene dolores de [place] muy intensos.  Siente debilidad [employer], los brazos o las  piernas.  Tiene cambios en la audicin.  Tiene cambios en la forma de ver (visin).  Tiene rigidez en el cuello.  La luz brillante empieza a molestarlo. Resumen  El vrtigo es la sensacin de que usted o las cosas que lo rodean se mueven cuando en realidad eso no sucede.  El mdico le har pruebas para encontrar la causa del vrtigo.  Tal vez le indiquen que evite algunas tareas, posiciones o movimientos.  Comunquese con un mdico si los medicamentos no lo ayudan o si tiene fiebre, sntomas nuevos o un cambio en el comportamiento.  Pida ayuda de inmediato si tiene dolores de cabeza muy intensos o si tiene cambios en la manera de hablar, or o ver. Esta informacin no tiene Marine scientist el consejo del mdico. Asegrese de hacerle al mdico cualquier pregunta que tenga. Document Released: 06/20/2010 Document Revised: 06/06/2018 Document Reviewed: 06/06/2018 Elsevier Patient Education  [place].

## 2019-04-07 ENCOUNTER — Encounter (HOSPITAL_COMMUNITY): Payer: Self-pay | Admitting: Emergency Medicine

## 2019-04-07 ENCOUNTER — Other Ambulatory Visit: Payer: Self-pay

## 2019-04-07 ENCOUNTER — Inpatient Hospital Stay (HOSPITAL_COMMUNITY)
Admission: EM | Admit: 2019-04-07 | Discharge: 2019-04-10 | DRG: 684 | Disposition: A | Discharge status: Home / Self Care | Payer: Self-pay | Attending: Internal Medicine | Admitting: Internal Medicine

## 2019-04-07 ENCOUNTER — Telehealth: Payer: Self-pay | Admitting: Emergency Medicine

## 2019-04-07 DIAGNOSIS — E118 Type 2 diabetes mellitus with unspecified complications: Secondary | ICD-10-CM | POA: Diagnosis present

## 2019-04-07 DIAGNOSIS — D631 Anemia in chronic kidney disease: Secondary | ICD-10-CM | POA: Diagnosis present

## 2019-04-07 DIAGNOSIS — E785 Hyperlipidemia, unspecified: Secondary | ICD-10-CM | POA: Diagnosis present

## 2019-04-07 DIAGNOSIS — N1832 Chronic kidney disease, stage 3b: Secondary | ICD-10-CM | POA: Diagnosis present

## 2019-04-07 DIAGNOSIS — Z20828 Contact with and (suspected) exposure to other viral communicable diseases: Secondary | ICD-10-CM | POA: Diagnosis present

## 2019-04-07 DIAGNOSIS — E1122 Type 2 diabetes mellitus with diabetic chronic kidney disease: Secondary | ICD-10-CM | POA: Diagnosis present

## 2019-04-07 DIAGNOSIS — Z7984 Long term (current) use of oral hypoglycemic drugs: Secondary | ICD-10-CM

## 2019-04-07 DIAGNOSIS — E119 Type 2 diabetes mellitus without complications: Secondary | ICD-10-CM | POA: Diagnosis present

## 2019-04-07 DIAGNOSIS — D649 Anemia, unspecified: Secondary | ICD-10-CM

## 2019-04-07 DIAGNOSIS — Z79899 Other long term (current) drug therapy: Secondary | ICD-10-CM

## 2019-04-07 DIAGNOSIS — Z833 Family history of diabetes mellitus: Secondary | ICD-10-CM

## 2019-04-07 DIAGNOSIS — E782 Mixed hyperlipidemia: Secondary | ICD-10-CM

## 2019-04-07 DIAGNOSIS — Z23 Encounter for immunization: Secondary | ICD-10-CM

## 2019-04-07 DIAGNOSIS — I129 Hypertensive chronic kidney disease with stage 1 through stage 4 chronic kidney disease, or unspecified chronic kidney disease: Secondary | ICD-10-CM | POA: Diagnosis present

## 2019-04-07 DIAGNOSIS — F419 Anxiety disorder, unspecified: Secondary | ICD-10-CM | POA: Diagnosis present

## 2019-04-07 DIAGNOSIS — N179 Acute kidney failure, unspecified: Principal | ICD-10-CM | POA: Diagnosis present

## 2019-04-07 LAB — BASIC METABOLIC PANEL
Anion gap: 10 (ref 5–15)
BUN: 26 mg/dL — ABNORMAL HIGH (ref 8–23)
CO2: 24 mmol/L (ref 22–32)
Calcium: 8.9 mg/dL (ref 8.9–10.3)
Chloride: 103 mmol/L (ref 98–111)
Creatinine, Ser: 1.87 mg/dL — ABNORMAL HIGH (ref 0.44–1.00)
GFR calc Af Amer: 33 mL/min — ABNORMAL LOW (ref >60)
GFR calc non Af Amer: 28 mL/min — ABNORMAL LOW (ref >60)
Glucose, Bld: 135 mg/dL — ABNORMAL HIGH (ref 70–99)
Potassium: 4.7 mmol/L (ref 3.5–5.1)
Sodium: 137 mmol/L (ref 135–145)

## 2019-04-07 LAB — LIPID PANEL
Chol/HDL Ratio: 4.8 ratio — ABNORMAL HIGH (ref 0.0–4.4)
Cholesterol, Total: 252 mg/dL — ABNORMAL HIGH (ref 100–199)
HDL: 53 mg/dL (ref >39)
LDL Chol Calc (NIH): 152 mg/dL — ABNORMAL HIGH (ref 0–99)
Triglycerides: 257 mg/dL — ABNORMAL HIGH (ref 0–149)
VLDL Cholesterol Cal: 47 mg/dL — ABNORMAL HIGH (ref 5–40)

## 2019-04-07 LAB — CBC WITH DIFFERENTIAL/PLATELET
Basophils Absolute: 0.1 10*3/uL (ref 0.0–0.2)
Basos: 1 %
EOS (ABSOLUTE): 0.1 10*3/uL (ref 0.0–0.4)
Eos: 1 %
Hematocrit: 25.8 % — ABNORMAL LOW (ref 34.0–46.6)
Hemoglobin: 8.5 g/dL — ABNORMAL LOW (ref 11.1–15.9)
Immature Grans (Abs): 0 10*3/uL (ref 0.0–0.1)
Immature Granulocytes: 0 %
Lymphocytes Absolute: 2.2 10*3/uL (ref 0.7–3.1)
Lymphs: 23 %
MCH: 29 pg (ref 26.6–33.0)
MCHC: 32.9 g/dL (ref 31.5–35.7)
MCV: 88 fL (ref 79–97)
Monocytes Absolute: 0.7 10*3/uL (ref 0.1–0.9)
Monocytes: 7 %
Neutrophils Absolute: 6.4 10*3/uL (ref 1.4–7.0)
Neutrophils: 68 %
Platelets: 225 10*3/uL (ref 150–450)
RBC: 2.93 x10E6/uL — ABNORMAL LOW (ref 3.77–5.28)
RDW: 14.3 % (ref 11.7–15.4)
WBC: 9.3 10*3/uL (ref 3.4–10.8)

## 2019-04-07 LAB — COMPREHENSIVE METABOLIC PANEL
ALT: 7 IU/L (ref 0–32)
AST: 12 IU/L (ref 0–40)
Albumin/Globulin Ratio: 1.6 (ref 1.2–2.2)
Albumin: 4 g/dL (ref 3.8–4.8)
Alkaline Phosphatase: 63 IU/L (ref 39–117)
BUN/Creatinine Ratio: 12 (ref 12–28)
BUN: 23 mg/dL (ref 8–27)
Bilirubin Total: <0.2 mg/dL (ref 0.0–1.2)
CO2: 21 mmol/L (ref 20–29)
Calcium: 9.3 mg/dL (ref 8.7–10.3)
Chloride: 108 mmol/L — ABNORMAL HIGH (ref 96–106)
Creatinine, Ser: 1.86 mg/dL — ABNORMAL HIGH (ref 0.57–1.00)
GFR calc Af Amer: 33 mL/min/{1.73_m2} — ABNORMAL LOW (ref >59)
GFR calc non Af Amer: 28 mL/min/{1.73_m2} — ABNORMAL LOW (ref >59)
Globulin, Total: 2.5 g/dL (ref 1.5–4.5)
Glucose: 140 mg/dL — ABNORMAL HIGH (ref 65–99)
Potassium: 6.1 mmol/L — ABNORMAL HIGH (ref 3.5–5.2)
Sodium: 141 mmol/L (ref 134–144)
Total Protein: 6.5 g/dL (ref 6.0–8.5)

## 2019-04-07 LAB — CBC
HCT: 26.5 % — ABNORMAL LOW (ref 36.0–46.0)
Hemoglobin: 8.5 g/dL — ABNORMAL LOW (ref 12.0–15.0)
MCH: 29 pg (ref 26.0–34.0)
MCHC: 32.1 g/dL (ref 30.0–36.0)
MCV: 90.4 fL (ref 80.0–100.0)
Platelets: 233 K/uL (ref 150–400)
RBC: 2.93 MIL/uL — ABNORMAL LOW (ref 3.87–5.11)
RDW: 14 % (ref 11.5–15.5)
WBC: 8.8 K/uL (ref 4.0–10.5)
nRBC: 0 % (ref 0.0–0.2)

## 2019-04-07 LAB — POC OCCULT BLOOD, ED: Fecal Occult Bld: NEGATIVE

## 2019-04-07 MED ORDER — SODIUM CHLORIDE 0.9% FLUSH: 3.0000 mL | Freq: Once | INTRAVENOUS | Status: DC

## 2019-04-07 MED ORDER — SODIUM CHLORIDE 0.9 % IV BOLUS
1000.0000 mL | Freq: Once | INTRAVENOUS | Status: AC
  Administered 2019-04-08: 1000 mL via INTRAVENOUS

## 2019-04-07 NOTE — ED Provider Notes (Signed)
[facility] EMERGENCY DEPARTMENT Provider Note   CSN: [id number] Arrival date & time: 04/07/19  1517     History   Chief Complaint Chief Complaint  Patient presents with  . Anemia  . Acute Renal Failure    HPI Rachael Burke is a 63 y.o. female.     The history is provided by the patient and medical records. The history is limited by a language barrier. A language interpreter was used.     63 y.o. F with hx of anemia, anxiety, DM2, hx of renal failure 3 months ago, presenting to the ED for abnormal labs from PCP that were done yesterday.  Patient reports lately she has been feeling very tired and has had decreased urine output.  Urine has not been discolored and she has not had any dysuria.  She reports she was told she had worsening anemia and renal failure and sent here for admission.  Patient denies taking heavy amounts of NSAIDs.  She denies any blood in the stool.  States she was told she was anemic in the past but never specified what kind.  Of note, patient was hospitalized in [place] 3 months ago for acute renal failure.  They recommended to start dialysis but patient opted not to.  She reports they gave her fluids and some type of medication and symptoms got better.  She was unsure what her renal function was when she left the hospital.  Past Medical History:  Diagnosis Date  . Anemia   . Anxiety   . Diabetes mellitus without complication [facility])     Patient Active Problem List   Diagnosis Date Noted  . Hypertension associated with diabetes ([place]) 04/06/2019  . Type 2 diabetes mellitus with complication, without long-term current use of insulin ([place]) 01/04/2018    Past Surgical History:  Procedure Laterality Date  . CESAREAN SECTION       OB History   No obstetric history on file.      Home Medications    Prior to Admission medications   Medication Sig Start Date End Date Taking? Authorizing Provider  atorvastatin (LIPITOR) 20 MG tablet  Take 1 tablet (20 mg total) by mouth daily. 04/06/19   Horald Pollen, MD  glipiZIDE (GLUCOTROL) 5 MG tablet Take 1 tablet (5 mg total) by mouth daily before breakfast. 04/06/19 07/05/19  Horald Pollen, MD  lisinopril (ZESTRIL) 10 MG tablet Take 1 tablet (10 mg total) by mouth daily. 04/06/19   Horald Pollen, MD  meclizine (ANTIVERT) 25 MG tablet Take 1 tablet (25 mg total) by mouth 3 (three) times daily as needed for dizziness. 04/06/19   Horald Pollen, MD  metFORMIN (GLUCOPHAGE) 500 MG tablet Take by mouth 2 (two) times daily with a meal.    [provider]    Family History Family History  Problem Relation Age of Onset  . Diabetes Mother   . Hypertension Father   . Diabetes Sister   . Diabetes Brother   . Diabetes Brother     Social History Social History   Tobacco Use  . Smoking status: Never Smoker  . Smokeless tobacco: Never Used  Substance Use Topics  . Alcohol use: Never    Frequency: Never  . Drug use: Never     Allergies   Patient has no known allergies.   Review of Systems Review of Systems  Constitutional:       Abnormal labs  All other systems reviewed and are negative.  Physical Exam Updated Vital Signs BP (!) 158/63   Pulse 79   Temp 98.4 F (36.9 C) (Oral)   Resp 14   Ht 5\' 3"  (1.6 m)   Wt 67.1 kg   SpO2 100%   BMI 26.22 kg/m   Physical Exam Vitals signs and nursing note reviewed.  Constitutional:      Appearance: She is well-developed.  HENT:     Head: Normocephalic and atraumatic.  Eyes:     Conjunctiva/sclera: Conjunctivae normal.     Pupils: Pupils are equal, round, and reactive to light.  Neck:     Musculoskeletal: Normal range of motion.  Cardiovascular:     Rate and Rhythm: Normal rate and regular rhythm.     Heart sounds: Normal heart sounds.  Pulmonary:     Effort: Pulmonary effort is normal.     Breath sounds: Normal breath sounds.  Abdominal:     General: Bowel sounds are normal.      Palpations: Abdomen is soft.  Genitourinary:    Comments: Exam chaperoned by RN Normal rectum, no gross blood on rectal exam, no hemorrhoids or internal masses noted Musculoskeletal: Normal range of motion.  Skin:    General: Skin is warm and dry.  Neurological:     Mental Status: She is alert and oriented to person, place, and time.      ED Treatments / Results  Labs (all labs ordered are listed, but only abnormal results are displayed) Labs Reviewed  BASIC METABOLIC PANEL - Abnormal; Notable for the following components:      Result Value   Glucose, Bld 135 (*)    BUN 26 (*)    Creatinine, Ser 1.87 (*)    GFR calc non Af Amer 28 (*)    GFR calc Af Amer 33 (*)    All other components within normal limits  CBC - Abnormal; Notable for the following components:   RBC 2.93 (*)    Hemoglobin 8.5 (*)    HCT 26.5 (*)    All other components within normal limits  SARS CORONAVIRUS 2 (TAT 6-24 HRS)  URINALYSIS, ROUTINE W REFLEX MICROSCOPIC  VITAMIN B12  FOLATE  IRON AND TIBC  FERRITIN  RETICULOCYTES  POC OCCULT BLOOD, ED    EKG None  Radiology No results found.  Procedures Procedures (including critical care time)  Medications Ordered in ED Medications  sodium chloride flush (NS) 0.9 % injection 3 mL (has no administration in time range)  sodium chloride 0.9 % bolus 1,000 mL (has no administration in time range)     Initial Impression / Assessment and Plan / ED Course  I have reviewed the triage vital signs and the nursing notes.  Pertinent labs & imaging results that were available during my care of the patient were reviewed by me and considered in my medical decision making (see chart for details).  63 y.o. F here with abnormal labs from PCP office indicating renal failure and anemia.  She states she has been fatigued with decreased urine output.  She is afebrile, non-toxic.  Labs today with SrCr of 1.87, BUN 26.  Denies heavy NSAID use but is on an ACEI.  Hemoglobin is 8.5, denies blood in the stool.  Hemoccult is negative.  Anemia panel sent and will start IVF.  Of note, patient was hospitalized 3 months ago in [place] and was on the verge of started dialysis-- not many details are known about this and no records to review, unsure what her renal  function was once she left hospital there.  Will admit for ongoing care.  Patient and daughter updated and agreeable.  Discussed with hospitalist, Dr. Marlowe Sax-- will admit.  Final Clinical Impressions(s) / ED Diagnoses   Final diagnoses:  AKI (acute kidney injury) ([place])  Anemia, unspecified type    ED Discharge Orders    None       Larene Pickett, PA-C 04/07/19 2331    Quintella Reichert, MD 04/09/19 1105

## 2019-04-07 NOTE — ED Triage Notes (Signed)
Pt reports having blood work done yesterday at her PCP and was told to come here for anemia and kidney failure.

## 2019-04-07 NOTE — Telephone Encounter (Signed)
Spoke to both patient and daughter.  Made them aware of blood results which showed renal failure with anemia and hyperkalemia.  Advised to go to emergency room today for further evaluation and treatment as well as repeat of labs. Additional history as follows: About 3 months ago while in [place] patient developed difficulty urinating and defecating, was admitted to the hospital with renal failure and dialysis was discussed.  Medical treatment was elected instead.  No other information available at this time.

## 2019-04-08 ENCOUNTER — Observation Stay (HOSPITAL_COMMUNITY): Payer: Self-pay

## 2019-04-08 DIAGNOSIS — E782 Mixed hyperlipidemia: Secondary | ICD-10-CM

## 2019-04-08 DIAGNOSIS — N179 Acute kidney failure, unspecified: Principal | ICD-10-CM

## 2019-04-08 DIAGNOSIS — E785 Hyperlipidemia, unspecified: Secondary | ICD-10-CM

## 2019-04-08 DIAGNOSIS — D649 Anemia, unspecified: Secondary | ICD-10-CM

## 2019-04-08 HISTORY — DX: Mixed hyperlipidemia: E78.2

## 2019-04-08 LAB — BASIC METABOLIC PANEL
Anion gap: 7 (ref 5–15)
Anion gap: 7 (ref 5–15)
BUN: 27 mg/dL — ABNORMAL HIGH (ref 8–23)
BUN: 23 mg/dL (ref 8–23)
CO2: 23 mmol/L (ref 22–32)
CO2: 23 mmol/L (ref 22–32)
Calcium: 8.7 mg/dL — ABNORMAL LOW (ref 8.9–10.3)
Calcium: 8.3 mg/dL — ABNORMAL LOW (ref 8.9–10.3)
Chloride: 108 mmol/L (ref 98–111)
Chloride: 109 mmol/L (ref 98–111)
Creatinine, Ser: 1.73 mg/dL — ABNORMAL HIGH (ref 0.44–1.00)
Creatinine, Ser: 1.62 mg/dL — ABNORMAL HIGH (ref 0.44–1.00)
GFR calc Af Amer: 36 mL/min — ABNORMAL LOW (ref >60)
GFR calc Af Amer: 39 mL/min — ABNORMAL LOW (ref >60)
GFR calc non Af Amer: 31 mL/min — ABNORMAL LOW (ref >60)
GFR calc non Af Amer: 33 mL/min — ABNORMAL LOW (ref >60)
Glucose, Bld: 191 mg/dL — ABNORMAL HIGH (ref 70–99)
Glucose, Bld: 201 mg/dL — ABNORMAL HIGH (ref 70–99)
Potassium: 5 mmol/L (ref 3.5–5.1)
Potassium: 5.2 mmol/L — ABNORMAL HIGH (ref 3.5–5.1)
Sodium: 138 mmol/L (ref 135–145)
Sodium: 139 mmol/L (ref 135–145)

## 2019-04-08 LAB — RETICULOCYTES
Immature Retic Fract: 9 % (ref 2.3–15.9)
RBC.: 2.94 MIL/uL — ABNORMAL LOW (ref 3.87–5.11)
Retic Count, Absolute: 63.2 10*3/uL (ref 19.0–186.0)
Retic Ct Pct: 2.2 % (ref 0.4–3.1)

## 2019-04-08 LAB — HIV ANTIBODY (ROUTINE TESTING W REFLEX): HIV Screen 4th Generation wRfx: NONREACTIVE

## 2019-04-08 LAB — FERRITIN: Ferritin: 273 ng/mL (ref 11–307)

## 2019-04-08 LAB — URINALYSIS, ROUTINE W REFLEX MICROSCOPIC
Bilirubin Urine: NEGATIVE
Glucose, UA: NEGATIVE mg/dL
Hgb urine dipstick: NEGATIVE
Ketones, ur: NEGATIVE mg/dL
Nitrite: POSITIVE — AB
Protein, ur: 100 mg/dL — AB
Specific Gravity, Urine: 1.009 (ref 1.005–1.030)
pH: 7 (ref 5.0–8.0)

## 2019-04-08 LAB — SARS CORONAVIRUS 2 (TAT 6-24 HRS): SARS Coronavirus 2: NEGATIVE

## 2019-04-08 LAB — HEMOGLOBIN AND HEMATOCRIT, BLOOD
HCT: 25.7 % — ABNORMAL LOW (ref 36.0–46.0)
Hemoglobin: 8.4 g/dL — ABNORMAL LOW (ref 12.0–15.0)

## 2019-04-08 LAB — IRON AND TIBC
Iron: 27 ug/dL — ABNORMAL LOW (ref 28–170)
Saturation Ratios: 13 % (ref 10.4–31.8)
TIBC: 214 ug/dL — ABNORMAL LOW (ref 250–450)
UIBC: 187 ug/dL

## 2019-04-08 LAB — FOLATE: Folate: 10.7 ng/mL (ref >5.9)

## 2019-04-08 LAB — SODIUM, URINE, RANDOM: Sodium, Ur: 85 mmol/L

## 2019-04-08 LAB — CREATININE, URINE, RANDOM: Creatinine, Urine: 48.46 mg/dL

## 2019-04-08 LAB — VITAMIN B12: Vitamin B-12: 1252 pg/mL — ABNORMAL HIGH (ref 180–914)

## 2019-04-08 LAB — GLUCOSE, CAPILLARY
Glucose-Capillary: 149 mg/dL — ABNORMAL HIGH (ref 70–99)
Glucose-Capillary: 191 mg/dL — ABNORMAL HIGH (ref 70–99)
Glucose-Capillary: 217 mg/dL — ABNORMAL HIGH (ref 70–99)
Glucose-Capillary: 194 mg/dL — ABNORMAL HIGH (ref 70–99)

## 2019-04-08 MED ORDER — SODIUM CHLORIDE 0.9 % IV SOLN
INTRAVENOUS | Status: AC
  Administered 2019-04-08: 04:00:00 via INTRAVENOUS

## 2019-04-08 MED ORDER — ENOXAPARIN SODIUM 30 MG/0.3ML ~~LOC~~ SOLN
30.0000 mg | Freq: Every day | SUBCUTANEOUS | Status: DC
  Administered 2019-04-08 – 2019-04-09 (×2): 30 mg via SUBCUTANEOUS
  Filled 2019-04-08 (×2): qty 0.3

## 2019-04-08 MED ORDER — INSULIN ASPART 100 UNIT/ML ~~LOC~~ SOLN
0.0000 [IU] | Freq: Three times a day (TID) | SUBCUTANEOUS | Status: DC
  Administered 2019-04-08: 2 [IU] via SUBCUTANEOUS
  Administered 2019-04-08: 3 [IU] via SUBCUTANEOUS
  Administered 2019-04-09: 1 [IU] via SUBCUTANEOUS
  Administered 2019-04-09: 2 [IU] via SUBCUTANEOUS
  Administered 2019-04-09: 3 [IU] via SUBCUTANEOUS
  Administered 2019-04-10: 2 [IU] via SUBCUTANEOUS

## 2019-04-08 MED ORDER — ACETAMINOPHEN 325 MG PO TABS: 650.0000 mg | ORAL_TABLET | Freq: Four times a day (QID) | ORAL | Status: DC | PRN

## 2019-04-08 MED ORDER — INSULIN ASPART 100 UNIT/ML ~~LOC~~ SOLN
0.0000 [IU] | Freq: Every day | SUBCUTANEOUS | Status: DC
  Administered 2019-04-09: 3 [IU] via SUBCUTANEOUS

## 2019-04-08 MED ORDER — ATORVASTATIN CALCIUM 10 MG PO TABS
20.0000 mg | ORAL_TABLET | Freq: Every day | ORAL | Status: DC
  Administered 2019-04-08 – 2019-04-10 (×3): 20 mg via ORAL
  Filled 2019-04-08 (×3): qty 2

## 2019-04-08 MED ORDER — ACETAMINOPHEN 650 MG RE SUPP: 650.0000 mg | Freq: Four times a day (QID) | RECTAL | Status: DC | PRN

## 2019-04-08 NOTE — Plan of Care (Signed)
  Problem: Education: Goal: Knowledge of General Education information will improve Description: Including pain rating scale, medication(s)/side effects and non-pharmacologic comfort measures Outcome: Progressing   Problem: Safety: Goal: Ability to remain free from injury will improve Outcome: Progressing   

## 2019-04-08 NOTE — ED Notes (Signed)
Assisted pt to the bathroom to obtain urine specimen.

## 2019-04-08 NOTE — H&P (Addendum)
History and Physical    Rachael Burke [id number] DOB: Jun 19, 1955 DOA: 04/07/2019  PCP: Horald Pollen, MD Patient coming from: Home  Chief Complaint: Anemia, kidney failure  HPI: Rachael Burke is a 63 y.o. female with medical history significant of anemia, anxiety, non-insulin-dependent diabetes mellitus being sent to the hospital by her PCP for evaluation of anemia and acute renal failure.  Spanish interpreter services used.  History provided by patient and daughter at bedside.  Patient was last seen by her PCP over a year ago.  She was in [place] 4 months ago and was told she was anemic and that she had kidney failure.  She was seen by a nephrologist over there.  Per patient and daughter, nephrologist did not recommend dialysis.  Daughter states she took her to her PCP 2 days ago because she has been complaining of dizziness and fatigue.  Her PCP discovered that she was anemic and her kidney function was abnormal.  She was started on lisinopril at the time of this visit.  Patient denies melena, hematochezia, hematemesis, hemoptysis, or hematuria.  She is postmenopausal and has not had any vaginal bleeding.  No complaints of abdominal pain.  Daughter states patient had endoscopy done in [place] 2 months ago and was told it was normal.  She has not had colonoscopy done.  ED Course: Hemodynamically stable.  Hemoglobin 8.5, was 11.2 in August 2019.  Anemia panel pending.  FOBT negative.  BUN 26, creatinine 1.8, and GFR 28.  Creatinine was 0.8 in August 2019.  UA pending. 1 L fluid bolus ordered.  Review of Systems:  All systems reviewed and apart from history of presenting illness, are negative.  Past Medical History:  Diagnosis Date  . Anemia   . Anxiety   . Diabetes mellitus without complication [facility])     Past Surgical History:  Procedure Laterality Date  . CESAREAN SECTION       reports that she has never smoked. She has never used smokeless tobacco. She reports that she does not  drink alcohol or use drugs.  No Known Allergies  Family History  Problem Relation Age of Onset  . Diabetes Mother   . Hypertension Father   . Diabetes Sister   . Diabetes Brother   . Diabetes Brother     Prior to Admission medications   Medication Sig Start Date End Date Taking? Authorizing Provider  atorvastatin (LIPITOR) 20 MG tablet Take 1 tablet (20 mg total) by mouth daily. 04/06/19  Yes Sagardia, Ines Bloomer, MD  glipiZIDE (GLUCOTROL) 5 MG tablet Take 1 tablet (5 mg total) by mouth daily before breakfast. 04/06/19 07/05/19 Yes Sagardia, Ines Bloomer, MD  lisinopril (ZESTRIL) 10 MG tablet Take 1 tablet (10 mg total) by mouth daily. 04/06/19  Yes Sagardia, Ines Bloomer, MD  meclizine (ANTIVERT) 25 MG tablet Take 1 tablet (25 mg total) by mouth 3 (three) times daily as needed for dizziness. 04/06/19  Yes Sagardia, Ines Bloomer, MD  metFORMIN (GLUCOPHAGE) 500 MG tablet Take 500 mg by mouth daily with breakfast.     [provider]    Physical Exam: Vitals:   04/07/19 1933 04/07/19 2230 04/07/19 2245 04/08/19 0000  BP: (!) 156/65 (!) 158/63 136/62 116/63  Pulse: 71 79 78 70  Resp: 18 14 19 15   Temp: 98.4 F (36.9 C)     TempSrc: Oral     SpO2: 100% 100% 100% 100%  Weight:      Height:  Physical Exam  Constitutional: She is oriented to person, place, and time. She appears well-developed and well-nourished. No distress.  HENT:  Head: Normocephalic.  Eyes: Right eye exhibits no discharge. Left eye exhibits no discharge.  Neck: Neck supple.  Cardiovascular: Normal rate, regular rhythm and intact distal pulses.  Pulmonary/Chest: Effort normal and breath sounds normal. No respiratory distress. She has no wheezes. She has no rales.  Abdominal: Soft. Bowel sounds are normal. She exhibits no distension. There is no abdominal tenderness. There is no guarding.  Musculoskeletal:     Comments: Trace pedal edema  Neurological: She is alert and oriented to person, place, and  time.  Skin: Skin is warm and dry. She is not diaphoretic.     Labs on Admission: I have personally reviewed following labs and imaging studies  CBC: Recent Labs  Lab 04/06/19 1137 04/07/19 1626  WBC 9.3 8.8  NEUTROABS 6.4  --   HGB 8.5* 8.5*  HCT 25.8* 26.5*  MCV 88 90.4  PLT 225 0000000   Basic Metabolic Panel: Recent Labs  Lab 04/06/19 1137 04/07/19 1626  NA 141 137  K 6.1* 4.7  CL 108* 103  CO2 21 24  GLUCOSE 140* 135*  BUN 23 26*  CREATININE 1.86* 1.87*  CALCIUM 9.3 8.9   GFR: Estimated Creatinine Clearance: 28.3 mL/min (A) (by C-G formula based on SCr of 1.87 mg/dL (H)). Liver Function Tests: Recent Labs  Lab 04/06/19 1137  AST 12  ALT 7  ALKPHOS 63  BILITOT <0.2  PROT 6.5  ALBUMIN 4.0   No results for input(s): LIPASE, AMYLASE in the last 168 hours. No results for input(s): AMMONIA in the last 168 hours. Coagulation Profile: No results for input(s): INR, PROTIME in the last 168 hours. Cardiac Enzymes: No results for input(s): CKTOTAL, CKMB, CKMBINDEX, TROPONINI in the last 168 hours. BNP (last 3 results) No results for input(s): PROBNP in the last 8760 hours. HbA1C: Recent Labs    04/06/19 1140  HGBA1C 6.7*   CBG: No results for input(s): GLUCAP in the last 168 hours. Lipid Profile: Recent Labs    04/06/19 1137  CHOL 252*  HDL 53  LDLCALC 152*  TRIG 257*  CHOLHDL 4.8*   Thyroid Function Tests: No results for input(s): TSH, T4TOTAL, FREET4, T3FREE, THYROIDAB in the last 72 hours. Anemia Panel: Recent Labs    04/07/19 2334  RETICCTPCT 2.2   Urine analysis: No results found for: COLORURINE, APPEARANCEUR, LABSPEC, PHURINE, GLUCOSEU, HGBUR, BILIRUBINUR, KETONESUR, PROTEINUR, UROBILINOGEN, NITRITE, LEUKOCYTESUR  Radiological Exams on Admission: No results found.  EKG: Independently reviewed.  Normal sinus rhythm.  Assessment/Plan Principal Problem:   AKI (acute kidney injury) ([place]) Active Problems:   Type 2 diabetes mellitus  with complication, without long-term current use of insulin (HCC)   Anemia   HLD (hyperlipidemia)   AKI versus progression of CKD in the setting of diabetes mellitus BUN 26, creatinine 1.8, and GFR 28.  Creatinine was 0.8 in August 2019 and 8.5 at the time of PCP visit 2 days ago.  Do not feel lisinopril is contributing as she was started on this medication at the time of her PCP visit 2 days ago.  Patient had acute renal failure in [place] 4 months ago and was seen by a nephrologist.  Does have diabetes but A1c improved on labs done 2 days ago. -Hold lisinopril at this time -IV fluid hydration -Continue to monitor renal function -Monitor urine output -Urinalysis -Renal ultrasound -Urine sodium, creatinine  Symptomatic anemia Hemodynamically  stable.  Hemoglobin 8.5, was 11.2 in August 2019.  FOBT negative.  Patient denies symptoms of acute GI bleed.  Per daughter, EGD done in [place] 2 months ago was normal.  She has not had colonoscopy done. -Anemia panel pending -Continue to monitor H&H -Consult GI in a.m. for colonoscopy.  Well-controlled non-insulin-dependent diabetes mellitus -A1c 6.7 on 11/5.  Sliding scale insulin sensitive and CBG checks. -Hold Metformin and glipizide  Hyperlipidemia -Continue Lipitor   HIV screening The patient falls between the ages of 13-64 and should be screened for HIV, therefore HIV testing ordered.  DVT prophylaxis: Lovenox (renally dosed) Code Status: Full code Family Communication: Daughter at bedside. Disposition Plan: Anticipate discharge after clinical improvement. Consults called: None Admission status: It is my clinical opinion that referral for OBSERVATION is reasonable and necessary in this patient based on the above information provided. The aforementioned taken together are felt to place the patient at high risk for further clinical deterioration. However it is anticipated that the patient may be medically stable for discharge from the  hospital within 24 to 48 hours.  The medical decision making on this patient was of high complexity and the patient is at high risk for clinical deterioration, therefore this is a level 3 visit.  Shela Leff MD [facility] Pager [phone]  If 7PM-7AM, please contact night-coverage [email or web address] Password [email or web address]  04/08/2019, 12:50 AM

## 2019-04-08 NOTE — Progress Notes (Signed)
Patient placed in observation after midnight.  Here with dizziness, AKI vs CKD and hyperkalemia.  Patient was hospitalized for 7 days in [place] for dizziness.... unknown what other issues she had but family describes the doctors wanting to do exploratory surgery.  After IVF her dizziness has resolved.  Renal u/s shows normal kidneys.  Recheck BMP.  D/C lisinopril BMP in AM and ? D/c if renal function improved Eulogio Bear DO

## 2019-04-09 LAB — HEMOGLOBIN AND HEMATOCRIT, BLOOD
HCT: 25.6 % — ABNORMAL LOW (ref 36.0–46.0)
Hemoglobin: 8.8 g/dL — ABNORMAL LOW (ref 12.0–15.0)

## 2019-04-09 LAB — CBC
HCT: 21.4 % — ABNORMAL LOW (ref 36.0–46.0)
Hemoglobin: 7.1 g/dL — ABNORMAL LOW (ref 12.0–15.0)
MCH: 28.9 pg (ref 26.0–34.0)
MCHC: 33.2 g/dL (ref 30.0–36.0)
MCV: 87 fL (ref 80.0–100.0)
Platelets: 187 10*3/uL (ref 150–400)
RBC: 2.46 MIL/uL — ABNORMAL LOW (ref 3.87–5.11)
RDW: 13.8 % (ref 11.5–15.5)
WBC: 7.1 10*3/uL (ref 4.0–10.5)
nRBC: 0 % (ref 0.0–0.2)

## 2019-04-09 LAB — ABO/RH: ABO/RH(D): A POS

## 2019-04-09 LAB — GLUCOSE, CAPILLARY
Glucose-Capillary: 137 mg/dL — ABNORMAL HIGH (ref 70–99)
Glucose-Capillary: 206 mg/dL — ABNORMAL HIGH (ref 70–99)
Glucose-Capillary: 174 mg/dL — ABNORMAL HIGH (ref 70–99)
Glucose-Capillary: 264 mg/dL — ABNORMAL HIGH (ref 70–99)

## 2019-04-09 LAB — BASIC METABOLIC PANEL
Anion gap: 8 (ref 5–15)
BUN: 22 mg/dL (ref 8–23)
CO2: 22 mmol/L (ref 22–32)
Calcium: 8.5 mg/dL — ABNORMAL LOW (ref 8.9–10.3)
Chloride: 110 mmol/L (ref 98–111)
Creatinine, Ser: 1.59 mg/dL — ABNORMAL HIGH (ref 0.44–1.00)
GFR calc Af Amer: 40 mL/min — ABNORMAL LOW (ref >60)
GFR calc non Af Amer: 34 mL/min — ABNORMAL LOW (ref >60)
Glucose, Bld: 136 mg/dL — ABNORMAL HIGH (ref 70–99)
Potassium: 4.3 mmol/L (ref 3.5–5.1)
Sodium: 140 mmol/L (ref 135–145)

## 2019-04-09 LAB — PREPARE RBC (CROSSMATCH)

## 2019-04-09 MED ORDER — SODIUM CHLORIDE 0.9% IV SOLUTION
Freq: Once | INTRAVENOUS | Status: AC
  Administered 2019-04-09: 14:00:00 via INTRAVENOUS

## 2019-04-09 MED ORDER — PNEUMOCOCCAL VAC POLYVALENT 25 MCG/0.5ML IJ INJ
0.5000 mL | INJECTION | INTRAMUSCULAR | Status: AC
  Administered 2019-04-10: 0.5 mL via INTRAMUSCULAR
  Filled 2019-04-09: qty 0.5

## 2019-04-09 MED ORDER — INFLUENZA VAC SPLIT QUAD 0.5 ML IM SUSY
0.5000 mL | PREFILLED_SYRINGE | INTRAMUSCULAR | Status: AC
  Administered 2019-04-10: 0.5 mL via INTRAMUSCULAR
  Filled 2019-04-09: qty 0.5

## 2019-04-09 MED ORDER — GLIPIZIDE 5 MG PO TABS
5.0000 mg | ORAL_TABLET | Freq: Every day | ORAL | Status: DC
  Administered 2019-04-10: 5 mg via ORAL
  Filled 2019-04-09: qty 1

## 2019-04-09 MED ORDER — METOPROLOL TARTRATE 25 MG PO TABS
25.0000 mg | ORAL_TABLET | Freq: Two times a day (BID) | ORAL | Status: DC
  Administered 2019-04-09 – 2019-04-10 (×3): 25 mg via ORAL
  Filled 2019-04-09 (×3): qty 1

## 2019-04-09 NOTE — Progress Notes (Signed)
Progress Note    Rachael Burke  [id number] DOB: January 10, 1956  DOA: 04/07/2019 PCP: Horald Pollen, MD    Brief Narrative:    Medical records reviewed and are as summarized below:  Rachael Burke is an 63 y.o. female with medical history significant of anemia, anxiety, non-insulin-dependent diabetes mellitus being sent to the hospital by her PCP for evaluation of anemia and acute renal failure.  Spanish interpreter services used.  History provided by patient and daughter at bedside.  Patient was last seen by her PCP over a year ago.  She was in [place] 4 months ago and was told she was anemic and that she had kidney failure.  She was seen by a nephrologist over there, nephrologist  recommend dialysis.  Daughter states she took her to her PCP 2 days ago because she has been complaining of dizziness and fatigue.  Her PCP discovered that she was anemic and her kidney function was abnormal.  She was started on lisinopril at the time of this visit.  Patient denies melena, hematochezia, hematemesis, hemoptysis, or hematuria.  She is postmenopausal and has not had any vaginal bleeding.  No complaints of abdominal pain.  Daughter states patient had endoscopy done in [place] 2 months ago and was told it was normal.  She has not had colonoscopy done.  Assessment/Plan:   Principal Problem:   AKI (acute kidney injury) ([place]) Active Problems:   Type 2 diabetes mellitus with complication, without long-term current use of insulin (HCC)   Anemia   HLD (hyperlipidemia)  Dizziness -resolved with IVF  AKI on CKD 3b -Cr 1 year ago was normal -patient was hospitalized in [place] and had renal failure then-- unclear -suspect related to poorly controlled DM and HTN -d/c lisinopril for now-- ? Causing RTA  DM -needs tight control -on PO meds -diet encouraged  HTN -no ACE/ARB -trial of metoprolol for BP control  Anemia of CD -heme negative -suspect was dehydrated when she came in so the IVF  caused volume dilution -transfuse 1 unit PRBCs -h/h after   Family Communication/Anticipated D/C date and plan/Code Status   DVT prophylaxis: scd Code Status: Full Code.  Family Communication: daughter at bedside Disposition Plan: home later tonight if h/h stable   Medical Consultants:    None.     Subjective:   No complaints  Objective:    Vitals:   04/08/19 2352 04/09/19 0619 04/09/19 1345 04/09/19 1406  BP: (!) 168/73 (!) 142/69 (!) 155/69 (!) 165/72  Pulse: 73 77 75 76  Resp: 16 16 16 16   Temp: 97.9 F (36.6 C) 98.2 F (36.8 C) 98.5 F (36.9 C) 98.2 F (36.8 C)  TempSrc: Oral Oral Oral Oral  SpO2: 100% 100% 100% 100%  Weight:      Height:        Intake/Output Summary (Last 24 hours) at 04/09/2019 1608 Last data filed at 04/09/2019 1500 Gross per 24 hour  Intake 11.5 ml  Output -  Net 11.5 ml   Filed Weights   04/07/19 1607  Weight: 67.1 kg    Exam: Up walking, NAD rrr No LE Edema Appears comfortable  Data Reviewed:   I have personally reviewed following labs and imaging studies:  Labs: Labs show the following:   Basic Metabolic Panel: Recent Labs  Lab 04/06/19 1137 04/07/19 1626 04/08/19 0218 04/08/19 1542 04/09/19 0537  NA 141 137 138 139 140  K 6.1* 4.7 5.0 5.2* 4.3  CL 108* 103 108 109 110  CO2 21 24 23 23 22   GLUCOSE 140* 135* 191* 201* 136*  BUN 23 26* 27* 23 22  CREATININE 1.86* 1.87* 1.73* 1.62* 1.59*  CALCIUM 9.3 8.9 8.7* 8.3* 8.5*   GFR Estimated Creatinine Clearance: 33.3 mL/min (A) (by C-G formula based on SCr of 1.59 mg/dL (H)). Liver Function Tests: Recent Labs  Lab 04/06/19 1137  AST 12  ALT 7  ALKPHOS 63  BILITOT <0.2  PROT 6.5  ALBUMIN 4.0   No results for input(s): LIPASE, AMYLASE in the last 168 hours. No results for input(s): AMMONIA in the last 168 hours. Coagulation profile No results for input(s): INR, PROTIME in the last 168 hours.  CBC: Recent Labs  Lab 04/06/19 1137 04/07/19 1626  April 15, 2019 0218 04/09/19 0537  WBC 9.3 8.8  --  7.1  NEUTROABS 6.4  --   --   --   HGB 8.5* 8.5* 8.4* 7.1*  HCT 25.8* 26.5* 25.7* 21.4*  MCV 88 90.4  --  87.0  PLT 225 233  --  187   Cardiac Enzymes: No results for input(s): CKTOTAL, CKMB, CKMBINDEX, TROPONINI in the last 168 hours. BNP (last 3 results) No results for input(s): PROBNP in the last 8760 hours. CBG: Recent Labs  Lab 04-15-2019 1606 04-15-19 2133 04/09/19 0617 04/09/19 1108 04/09/19 1546  GLUCAP 217* 194* 137* 206* 174*   D-Dimer: No results for input(s): DDIMER in the last 72 hours. Hgb A1c: No results for input(s): HGBA1C in the last 72 hours. Lipid Profile: No results for input(s): CHOL, HDL, LDLCALC, TRIG, CHOLHDL, LDLDIRECT in the last 72 hours. Thyroid function studies: No results for input(s): TSH, T4TOTAL, T3FREE, THYROIDAB in the last 72 hours.  Invalid input(s): FREET3 Anemia work up: Recent Labs    04/07/19 2334  VITAMINB12 1,252*  FOLATE 10.7  FERRITIN 273  TIBC 214*  IRON 27*  RETICCTPCT 2.2   Sepsis Labs: Recent Labs  Lab 04/06/19 1137 04/07/19 1626 04/09/19 0537  WBC 9.3 8.8 7.1    Microbiology Recent Results (from the past 240 hour(s))  SARS CORONAVIRUS 2 (TAT 6-24 HRS) Nasopharyngeal Nasopharyngeal Swab     Status: None   Collection Time: 04/07/19 11:34 PM   Specimen: Nasopharyngeal Swab  Result Value Ref Range Status   SARS Coronavirus 2 NEGATIVE NEGATIVE Final    Comment: (NOTE) SARS-CoV-2 target nucleic acids are NOT DETECTED. The SARS-CoV-2 RNA is generally detectable in upper and lower respiratory specimens during the acute phase of infection. Negative results do not preclude SARS-CoV-2 infection, do not rule out co-infections with other pathogens, and should not be used as the sole basis for treatment or other patient management decisions. Negative results must be combined with clinical observations, patient history, and epidemiological information. The expected  result is Negative. Fact Sheet for Patients: SugarRoll.be Fact Sheet for Healthcare Providers: https://www.woods-mathews.com/ This test is not yet approved or cleared by the [place] FDA and  has been authorized for detection and/or diagnosis of SARS-CoV-2 by FDA under an Emergency Use Authorization (EUA). This EUA will remain  in effect (meaning this test can be used) for the duration of the COVID-19 declaration under Section 56 4(b)(1) of the Act, 21 U.S.C. section 360bbb-3(b)(1), unless the authorization is terminated or revoked sooner. Performed at [facility], [place] [address], [place], [place] [address]     Procedures and diagnostic studies:  US Renal  Result Date: 15-Apr-2019 CLINICAL DATA:  Initial evaluation for acute renal insufficiency, elevated BUN/creatinine. EXAM: RENAL / URINARY TRACT  ULTRASOUND COMPLETE COMPARISON:  None. FINDINGS: Right Kidney: Renal measurements: 10.7 x 5.2 x 4.9 cm = volume: 142.1 mL . Echogenicity within normal limits. No mass or hydronephrosis visualized. Left Kidney: Renal measurements: 10.8 x 5.8 x 5.7 cm = volume: 185.8 mL. Echogenicity within normal limits. No mass or hydronephrosis visualized. Bladder: Appears normal for degree of bladder distention. Bilateral ureteral jets visualized. Other: None. IMPRESSION: Normal renal ultrasound. No hydronephrosis or other significant finding. Electronically Signed   By: Jeannine Boga M.D.   On: 04/08/2019 02:54    Medications:   . atorvastatin  20 mg Oral Daily  . enoxaparin (LOVENOX) injection  30 mg Subcutaneous Daily  . [START ON 04/10/2019] glipiZIDE  5 mg Oral QAC breakfast  . [START ON 04/10/2019] influenza vac split quadrivalent PF  0.5 mL Intramuscular Tomorrow-1000  . insulin aspart  0-5 Units Subcutaneous QHS  . insulin aspart  0-9 Units Subcutaneous TID WC  . metoprolol tartrate  25 mg Oral BID  . [START ON 04/10/2019] pneumococcal 23  valent vaccine  0.5 mL Intramuscular Tomorrow-1000  . sodium chloride flush  3 mL Intravenous Once   Continuous Infusions:   LOS: 1 day   Geradine Girt  [facility]   How to contact the [facility] Attending or Consulting provider [place] or covering provider during after hours [place], for this patient?  1. Check the care team in [facility] and look for a) attending/consulting [facility] provider listed and b) the [facility] team listed 2. Log into [email or web address] and use [place]'s universal password to access. If you do not have the password, please contact the hospital operator. 3. Locate the [facility] provider you are looking for under [facility] and page to a number that you can be directly reached. 4. If you still have difficulty reaching the provider, please page the [facility] (Director on Call) for the Hospitalists listed on amion for assistance.  04/09/2019, 4:08 PM

## 2019-04-10 ENCOUNTER — Encounter (HOSPITAL_COMMUNITY): Payer: Self-pay

## 2019-04-10 LAB — CBC
HCT: 26.9 % — ABNORMAL LOW (ref 36.0–46.0)
Hemoglobin: 9 g/dL — ABNORMAL LOW (ref 12.0–15.0)
MCH: 29 pg (ref 26.0–34.0)
MCHC: 33.5 g/dL (ref 30.0–36.0)
MCV: 86.8 fL (ref 80.0–100.0)
Platelets: 189 10*3/uL (ref 150–400)
RBC: 3.1 MIL/uL — ABNORMAL LOW (ref 3.87–5.11)
RDW: 14.3 % (ref 11.5–15.5)
WBC: 8.4 10*3/uL (ref 4.0–10.5)
nRBC: 0 % (ref 0.0–0.2)

## 2019-04-10 LAB — TYPE AND SCREEN
ABO/RH(D): A POS
Antibody Screen: NEGATIVE
Unit division: 0

## 2019-04-10 LAB — BPAM RBC
Blood Product Expiration Date: 202011292359
ISSUE DATE / TIME: 202011081343
Unit Type and Rh: 6200

## 2019-04-10 LAB — BASIC METABOLIC PANEL
Anion gap: 7 (ref 5–15)
BUN: 25 mg/dL — ABNORMAL HIGH (ref 8–23)
CO2: 21 mmol/L — ABNORMAL LOW (ref 22–32)
Calcium: 8.6 mg/dL — ABNORMAL LOW (ref 8.9–10.3)
Chloride: 112 mmol/L — ABNORMAL HIGH (ref 98–111)
Creatinine, Ser: 1.64 mg/dL — ABNORMAL HIGH (ref 0.44–1.00)
GFR calc Af Amer: 38 mL/min — ABNORMAL LOW (ref >60)
GFR calc non Af Amer: 33 mL/min — ABNORMAL LOW (ref >60)
Glucose, Bld: 196 mg/dL — ABNORMAL HIGH (ref 70–99)
Potassium: 4 mmol/L (ref 3.5–5.1)
Sodium: 140 mmol/L (ref 135–145)

## 2019-04-10 LAB — GLUCOSE, CAPILLARY
Glucose-Capillary: 179 mg/dL — ABNORMAL HIGH (ref 70–99)
Glucose-Capillary: 149 mg/dL — ABNORMAL HIGH (ref 70–99)

## 2019-04-10 MED ORDER — ENOXAPARIN SODIUM 40 MG/0.4ML ~~LOC~~ SOLN
40.0000 mg | Freq: Every day | SUBCUTANEOUS | Status: DC
  Filled 2019-04-10: qty 0.4

## 2019-04-10 MED ORDER — METOPROLOL TARTRATE 25 MG PO TABS: 25.0000 mg | ORAL_TABLET | Freq: Two times a day (BID) | ORAL | 0 refills | Status: DC

## 2019-04-10 NOTE — Discharge Summary (Signed)
Physician Discharge Summary  [name] [id number] DOB: [birth year] DOA: 04/07/2019  PCP: Horald Pollen, MD  Admit date: 04/07/2019 Discharge date: 04/10/2019  Admitted From: Home Disposition:  Home  Recommendations for Outpatient Follow-up:  1. Follow up with PCP in 1 week 2. Recommend outpatient Nephrology referral  3. Please obtain CBC BMP in 1 week   Discharge Condition: Stable CODE STATUS: Full  Diet recommendation: Renal diet/ carb modified   Brief/Interim Summary: Rachael Burke is an 63 y.o. female with medical history significant ofanemia, anxiety, non-insulin-dependent diabetes mellitus being sent to the hospital by her PCP for evaluation of anemia and acute renal failure.Spanish interpreter services used. History provided by patient and daughter at bedside. Patient was last seen by her PCP over a year ago. She was in [place] 4 months ago and was told she was anemic and that she had kidney failure. She was seen by a nephrologist over there, nephrologist recommend dialysis. Daughter states she took her to her PCP 2 days ago because she has been complaining of dizziness and fatigue. Her PCP discovered that she was anemic and her kidney function was abnormal. She was started on lisinopril at the time of this visit. Patient denies melena, hematochezia, hematemesis, hemoptysis, or hematuria. She is postmenopausal and has not had any vaginal bleeding. No complaints of abdominal pain. Daughter states patient had endoscopy done in [place] 2 months ago and was told it was normal. She has not had colonoscopy done.  During her hospitalization, she was transfused 1u pRBC with stabilization of her Hgb. Cr remained elevated from her baseline 1 year ago, but stable since admission.   Discharge Diagnoses:  Principal Problem:   AKI (acute kidney injury) ([place]) Active Problems:   Type 2 diabetes mellitus with complication, without long-term current use of insulin ([place])    Anemia   HLD (hyperlipidemia)   AKI on CKD 3b -Cr 1 year ago was 0.88 in Aug 2019  -Patient was hospitalized in [place] few months ago and per patient report had renal failure, was told she needed to start HD (no further history available regarding her labs or etiology of renal failure at that time) -Renal US: Normal renal ultrasound. No hydronephrosis or other significant finding.  -DC lisinopril -Cr has remained relatively stable since admission 1.86, 1.87, 1.73, 1.62, 1.59, 1.64 -Discussed with Nephrology on call, patient needs to establish with Nephrology as outpatient and recommend PCP make Nephrology referral   Anemia of chronic disease  -Hemoccult negative -Ferritin 273 -Vit B 12 1252  -Suspect was dehydrated when she came in so the IVF caused volume dilution -Transfused 1 unit PRBCs with stabilization of Hgb and improvement in dizziness complaint   DM type 2 -Ha1c 6.7  -Continue glipizide  HTN -DC lisinopril -Start metoprolol   HLD -Continue lipitor       Discharge Instructions  Discharge Instructions    Ambulatory referral to Nephrology   Complete by: As directed    Call MD for:  difficulty breathing, headache or visual disturbances   Complete by: As directed    Call MD for:  extreme fatigue   Complete by: As directed    Call MD for:  persistant dizziness or light-headedness   Complete by: As directed    Call MD for:  persistant nausea and vomiting   Complete by: As directed    Call MD for:  severe uncontrolled pain   Complete by: As directed    Call MD for:  temperature >100.4   Complete  by: As directed    Diet Carb Modified   Complete by: As directed    Discharge instructions   Complete by: As directed    You were cared for by a hospitalist during your hospital stay. If you have any questions about your discharge medications or the care you received while you were in the hospital after you are discharged, you can call the unit and ask to speak with  the hospitalist on call if the hospitalist that took care of you is not available. Once you are discharged, your primary care physician will handle any further medical issues. Please note that NO REFILLS for any discharge medications will be authorized once you are discharged, as it is imperative that you return to your primary care physician (or establish a relationship with a primary care physician if you do not have one) for your aftercare needs so that they can reassess your need for medications and monitor your lab values.   Increase activity slowly   Complete by: As directed      Allergies as of 04/10/2019   No Known Allergies     Medication List    TAKE these medications   atorvastatin 20 MG tablet Commonly known as: LIPITOR Take 1 tablet (20 mg total) by mouth daily.   glipiZIDE 5 MG tablet Commonly known as: GLUCOTROL Take 1 tablet (5 mg total) by mouth daily before breakfast.   meclizine 25 MG tablet Commonly known as: ANTIVERT Take 1 tablet (25 mg total) by mouth 3 (three) times daily as needed for dizziness.   metoprolol tartrate 25 MG tablet Commonly known as: LOPRESSOR Take 1 tablet (25 mg total) by mouth 2 (two) times daily.      Follow-up Information    Horald Pollen, MD Follow up in 1 week(s).   Specialty: Internal Medicine Why: cbc, bmp Contact information: [place] [id number] [phone]          No Known Allergies  Consultations:  None    Procedures/Studies: US Renal  Result Date: 04/08/2019 CLINICAL DATA:  Initial evaluation for acute renal insufficiency, elevated BUN/creatinine. EXAM: RENAL / URINARY TRACT ULTRASOUND COMPLETE COMPARISON:  None. FINDINGS: Right Kidney: Renal measurements: 10.7 x 5.2 x 4.9 cm = volume: 142.1 mL . Echogenicity within normal limits. No mass or hydronephrosis visualized. Left Kidney: Renal measurements: 10.8 x 5.8 x 5.7 cm = volume: 185.8 mL. Echogenicity within normal limits. No mass or  hydronephrosis visualized. Bladder: Appears normal for degree of bladder distention. Bilateral ureteral jets visualized. Other: None. IMPRESSION: Normal renal ultrasound. No hydronephrosis or other significant finding. Electronically Signed   By: Jeannine Boga M.D.   On: 04/08/2019 02:54        Discharge Exam: Vitals:   04/09/19 2308 04/10/19 0452  BP: (!) 157/69 (!) 174/61  Pulse: 70 70  Resp: 13 13  Temp: 98.2 F (36.8 C) 97.8 F (36.6 C)  SpO2: 95% 97%     General: Pt is alert, awake, not in acute distress Cardiovascular: RRR, S1/S2 +, no edema Respiratory: CTA bilaterally, no wheezing, no rhonchi, no respiratory distress, no conversational dyspnea  Abdominal: Soft, NT, ND, bowel sounds + Extremities: no edema, no cyanosis Psych: Normal mood and affect, stable judgement and insight     The results of significant diagnostics from this hospitalization (including imaging, microbiology, ancillary and laboratory) are listed below for reference.     Microbiology: Recent Results (from the past 240 hour(s))  SARS CORONAVIRUS 2 (TAT 6-24  HRS) Nasopharyngeal Nasopharyngeal Swab     Status: None   Collection Time: 04/07/19 11:34 PM   Specimen: Nasopharyngeal Swab  Result Value Ref Range Status   SARS Coronavirus 2 NEGATIVE NEGATIVE Final    Comment: (NOTE) SARS-CoV-2 target nucleic acids are NOT DETECTED. The SARS-CoV-2 RNA is generally detectable in upper and lower respiratory specimens during the acute phase of infection. Negative results do not preclude SARS-CoV-2 infection, do not rule out co-infections with other pathogens, and should not be used as the sole basis for treatment or other patient management decisions. Negative results must be combined with clinical observations, patient history, and epidemiological information. The expected result is Negative. Fact Sheet for Patients: SugarRoll.be Fact Sheet for Healthcare  Providers: https://www.woods-mathews.com/ This test is not yet approved or cleared by the [place] FDA and  has been authorized for detection and/or diagnosis of SARS-CoV-2 by FDA under an Emergency Use Authorization (EUA). This EUA will remain  in effect (meaning this test can be used) for the duration of the COVID-19 declaration under Section 56 4(b)(1) of the Act, 21 U.S.C. section 360bbb-3(b)(1), unless the authorization is terminated or revoked sooner. Performed at [facility], [place] [address], [place], [place] [address]      Labs: BNP (last 3 results) No results for input(s): BNP in the last 8760 hours. Basic Metabolic Panel: Recent Labs  Lab 04/07/19 1626 04/08/19 0218 04/08/19 1542 04/09/19 0537 04/10/19 0755  NA 137 138 139 140 140  K 4.7 5.0 5.2* 4.3 4.0  CL 103 108 109 110 112*  CO2 24 23 23 22  21*  GLUCOSE 135* 191* 201* 136* 196*  BUN 26* 27* 23 22 25*  CREATININE 1.87* 1.73* 1.62* 1.59* 1.64*  CALCIUM 8.9 8.7* 8.3* 8.5* 8.6*   Liver Function Tests: Recent Labs  Lab 04/06/19 1137  AST 12  ALT 7  ALKPHOS 63  BILITOT <0.2  PROT 6.5  ALBUMIN 4.0   No results for input(s): LIPASE, AMYLASE in the last 168 hours. No results for input(s): AMMONIA in the last 168 hours. CBC: Recent Labs  Lab 04/06/19 1137 04/07/19 1626 04/08/19 0218 04/09/19 0537 04/09/19 1900 04/10/19 0755  WBC 9.3 8.8  --  7.1  --  8.4  NEUTROABS 6.4  --   --   --   --   --   HGB 8.5* 8.5* 8.4* 7.1* 8.8* 9.0*  HCT 25.8* 26.5* 25.7* 21.4* 25.6* 26.9*  MCV 88 90.4  --  87.0  --  86.8  PLT 225 233  --  187  --  189   Cardiac Enzymes: No results for input(s): CKTOTAL, CKMB, CKMBINDEX, TROPONINI in the last 168 hours. BNP: Invalid input(s): POCBNP CBG: Recent Labs  Lab 04/09/19 0617 04/09/19 1108 04/09/19 1546 04/09/19 2306 04/10/19 0631  GLUCAP 137* 206* 174* 264* 179*   D-Dimer No results for input(s): DDIMER in the last 72 hours. Hgb A1c No  results for input(s): HGBA1C in the last 72 hours. Lipid Profile No results for input(s): CHOL, HDL, LDLCALC, TRIG, CHOLHDL, LDLDIRECT in the last 72 hours. Thyroid function studies No results for input(s): TSH, T4TOTAL, T3FREE, THYROIDAB in the last 72 hours.  Invalid input(s): FREET3 Anemia work up Recent Labs    04/07/19 2334  VITAMINB12 1,252*  FOLATE 10.7  FERRITIN 273  TIBC 214*  IRON 27*  RETICCTPCT 2.2   Urinalysis    Component Value Date/Time   COLORURINE YELLOW 04/08/2019 0111   APPEARANCEUR HAZY (A) 04/08/2019 0111  LABSPEC 1.009 04/08/2019 0111   PHURINE 7.0 04/08/2019 0111   GLUCOSEU NEGATIVE 04/08/2019 0111   HGBUR NEGATIVE 04/08/2019 0111   BILIRUBINUR NEGATIVE 04/08/2019 0111   KETONESUR NEGATIVE 04/08/2019 0111   PROTEINUR 100 (A) 04/08/2019 0111   NITRITE POSITIVE (A) 04/08/2019 0111   LEUKOCYTESUR MODERATE (A) 04/08/2019 0111   Sepsis Labs Invalid input(s): PROCALCITONIN,  WBC,  LACTICIDVEN Microbiology Recent Results (from the past 240 hour(s))  SARS CORONAVIRUS 2 (TAT 6-24 HRS) Nasopharyngeal Nasopharyngeal Swab     Status: None   Collection Time: 04/07/19 11:34 PM   Specimen: Nasopharyngeal Swab  Result Value Ref Range Status   SARS Coronavirus 2 NEGATIVE NEGATIVE Final    Comment: (NOTE) SARS-CoV-2 target nucleic acids are NOT DETECTED. The SARS-CoV-2 RNA is generally detectable in upper and lower respiratory specimens during the acute phase of infection. Negative results do not preclude SARS-CoV-2 infection, do not rule out co-infections with other pathogens, and should not be used as the sole basis for treatment or other patient management decisions. Negative results must be combined with clinical observations, patient history, and epidemiological information. The expected result is Negative. Fact Sheet for Patients: SugarRoll.be Fact Sheet for Healthcare  Providers: https://www.woods-mathews.com/ This test is not yet approved or cleared by the [place] FDA and  has been authorized for detection and/or diagnosis of SARS-CoV-2 by FDA under an Emergency Use Authorization (EUA). This EUA will remain  in effect (meaning this test can be used) for the duration of the COVID-19 declaration under Section 56 4(b)(1) of the Act, 21 U.S.C. section 360bbb-3(b)(1), unless the authorization is terminated or revoked sooner. Performed at [facility], [place] [address] [place], [place] [address]      Patient was seen and examined on the day of discharge and was found to be in stable condition. Time coordinating discharge: 40 minutes including assessment and coordination of care, as well as examination of the patient.   SIGNED:  Dessa Phi, DO [facility] 04/10/2019, 10:33 AM

## 2019-04-10 NOTE — Discharge Instructions (Signed)
DO NOT TAKE ANY SUPPLEMENTS/HERBAL MEDICATIONS DO NOT TAKE MOTRIN/IBUPROFEN/ALEVE (NSAIDS) DRINK PLENTY OF WATER KEEP BLOOD SUGARS CONTROLLED STOP LISINOPRIL

## 2019-04-17 ENCOUNTER — Telehealth: Payer: Self-pay | Admitting: Emergency Medicine

## 2019-04-17 ENCOUNTER — Encounter: Payer: Self-pay | Admitting: Emergency Medicine

## 2019-04-17 NOTE — Telephone Encounter (Signed)
Pt requesting referral from hospital visit   [facility]  Kidney specialist  Benjamine Mola  Please advise

## 2019-04-18 NOTE — Telephone Encounter (Signed)
Please advise 

## 2019-05-04 ENCOUNTER — Ambulatory Visit (INDEPENDENT_AMBULATORY_CARE_PROVIDER_SITE_OTHER): Payer: Self-pay | Admitting: Emergency Medicine

## 2019-05-04 ENCOUNTER — Other Ambulatory Visit: Payer: Self-pay

## 2019-05-04 ENCOUNTER — Encounter: Payer: Self-pay | Admitting: Emergency Medicine

## 2019-05-04 VITALS — BP 193/74 | HR 72 | Temp 98.0°F | Resp 16 | Ht 63.0 in | Wt 160.6 lb

## 2019-05-04 DIAGNOSIS — N1832 Chronic kidney disease, stage 3b: Secondary | ICD-10-CM

## 2019-05-04 DIAGNOSIS — H8111 Benign paroxysmal vertigo, right ear: Secondary | ICD-10-CM

## 2019-05-04 DIAGNOSIS — E1159 Type 2 diabetes mellitus with other circulatory complications: Secondary | ICD-10-CM

## 2019-05-04 DIAGNOSIS — I152 Hypertension secondary to endocrine disorders: Secondary | ICD-10-CM

## 2019-05-04 DIAGNOSIS — I10 Essential (primary) hypertension: Secondary | ICD-10-CM

## 2019-05-04 MED ORDER — MECLIZINE HCL 25 MG PO TABS: 25.0000 mg | ORAL_TABLET | Freq: Three times a day (TID) | ORAL | 0 refills | Status: DC | PRN

## 2019-05-04 NOTE — Patient Instructions (Addendum)
If you have lab work done today you will be contacted with your lab results within the next 2 weeks.  If you have not heard from [place] then please contact us. The fastest way to get your results is to register for My Chart.   IF you received an x-ray today, you will receive an invoice from [facility]. Please contact [facility] at [phone] with questions or concerns regarding your invoice.   IF you received labwork today, you will receive an invoice from [facility]. Please contact [facility] at [phone] with questions or concerns regarding your invoice.   Our billing staff will not be able to assist you with questions regarding bills from these companies.  You will be contacted with the lab results as soon as they are available. The fastest way to get your results is to activate your My Chart account. Instructions are located on the last page of this paperwork. If you have not heard from [place] regarding the results in 2 weeks, please contact this office.     Enfermedad renal crnica en los adultos Chronic Kidney Disease, Adult La enfermedad renal crnica (ERC) ocurre cuando los riones sufren daos durante un largo perodo. Los riones son dos rganos que ayudan a lo siguiente:  [employer] desechos y el exceso de lquido de Herbalist.  Producir hormonas que [employer] cantidad de lquido [employer] tejidos y los vasos sanguneos.  Mantener la cantidad correcta de lquidos y de sustancias qumicas en el organismo. [place] veces, la ERC no desaparece; sin embargo, con frecuencia, puede controlarse. Se deben tomar medidas para detener el dao renal o evitar que empeore. De otro modo, los riones podran dejar de funcionar. Siga estas indicaciones en su casa: Medicamentos  [employer] de venta libre y los recetados solamente como se lo haya indicado el mdico. Es posible que deba modificar la cantidad de medicamentos que toma.  No tome ningn  medicamento nuevo, excepto que el mdico lo autorice. Muchos medicamentos pueden empeorar el dao renal.  No tome vitaminas ni suplementos, excepto que el mdico lo autorice. Muchas vitaminas y suplementos pueden empeorar el dao renal. Instrucciones generales  Siga la dieta como se lo haya indicado el mdico. Es posible que no pueda consumir lo siguiente: ? Alcohol. ? Alimentos salados. ? Alimentos ricos en lo siguiente:  [place].  Calcio.  Protena.  No consuma ningn producto que contenga nicotina o tabaco, como cigarrillos y Psychologist, sport and exercise. Si necesita ayuda para dejar de fumar, consulte al mdico.  Lleve un registro de la presin arterial en su hogar. Informe al mdico si hay algn cambio.  Si tiene diabetes, registre Retail buyer de nivel de glucosa en la sangre como se lo haya indicado el mdico.  Intente mantener un peso saludable. Si necesita ayuda, consulte al [employer].  Sherilyn Cooter ejercicios al menos 93minutos al da, 5das a la semana.  [place] se lo haya indicado el mdico.  Concurra a todas las visitas de seguimiento como se lo haya indicado el mdico. Esto es importante. Comunquese con un mdico si:  Los sntomas empeoran.  Aparecen nuevos sntomas. Solicite ayuda de inmediato si:  Tiene sntomas de enfermedad renal terminal. Estos pueden incluir lo siguiente: ? Dolores de [place]. ? Adormecimiento de las manos o de los pies. ? Hematomas que aparecen con facilidad. ? Hipo frecuente. ? Dolor en el pecho. ? Falta de aire. ? Ausencia de la [employer].  Tiene fiebre.  Hace muy poco pis (orina).  Siente dolor o tiene una hemorragia al [employer]. Resumen  La enfermedad renal crnica (ERC) ocurre cuando los riones sufren daos durante un largo perodo.  [place] veces, esta afeccin no desaparece, pero a menudo puede ser [employer]. Se deben tomar medidas para detener el dao renal o evitar que  empeore.  El tratamiento puede incluir una combinacin de medicamentos y cambios en el estilo de vida. Esta informacin no tiene Marine scientist el consejo del mdico. Asegrese de hacerle al mdico cualquier pregunta que tenga. Document Released: 06/20/2010 Document Revised: 01/05/2017 Document Reviewed: 01/05/2017 Elsevier Patient Education  2020 [employer].

## 2019-05-04 NOTE — Progress Notes (Signed)
Physician Discharge Summary  Rachael Burke [id number] DOB: 09/08/1955 DOA: 04/07/2019  PCP: Horald Pollen, MD  Admit date: 04/07/2019 Discharge date: 04/10/2019  Admitted From: Home Disposition:  Home  Recommendations for Outpatient Follow-up:  1. Follow up with PCP in 1 week 2. Recommend outpatient Nephrology referral  3. Please obtain CBC BMP in 1 week   Discharge Condition: Stable CODE STATUS: Full  Diet recommendation: Renal diet/ carb modified   Brief/Interim Summary: Rachael Burke an 63 y.o.femalewith medical history significant ofanemia, anxiety, non-insulin-dependent diabetes mellitus being sent to the hospital by her PCP for evaluation of anemia and acute renal failure.Spanish interpreter services used. History provided by patient and daughter at bedside. Patient was last seen by her PCP over a year ago. She was in [place] 4 months ago and was told she was anemic and that she had kidney failure. She was seen by a nephrologist over there,nephrologist recommend dialysis. Daughter states she took her to her PCP 2 days ago because she has been complaining of dizziness and fatigue. Her PCP discovered that she was anemic and her kidney function was abnormal. She was started on lisinopril at the time of this visit. Patient denies melena, hematochezia, hematemesis, hemoptysis, or hematuria. She is postmenopausal and has not had any vaginal bleeding. No complaints of abdominal pain. Daughter states patient had endoscopy done in [place] 2 months ago and was told it was normal. She has not had colonoscopy done.  During her hospitalization, she was transfused 1u pRBC with stabilization of her Hgb. Cr remained elevated from her baseline 1 year ago, but stable since admission.   Discharge Diagnoses:  Principal Problem:   AKI (acute kidney injury) ([place]) Active Problems:   Type 2 diabetes mellitus with complication, without long-term current use of insulin  ([place])   Anemia   HLD (hyperlipidemia)   AKI on CKD 3b -Cr 1 year ago was 0.88 in Aug 2019  -Patient was hospitalized in [place] few months ago and per patient report had renal failure, was told she needed to start HD (no further history available regarding her labs or etiology of renal failure at that time) -Renal US: Normal renal ultrasound. No hydronephrosis or other significant finding.  -DC lisinopril -Cr has remained relatively stable since admission 1.86, 1.87, 1.73, 1.62, 1.59, 1.64 -Discussed with Nephrology on call, patient needs to establish with Nephrology as outpatient and recommend PCP make Nephrology referral   Anemia of chronic disease  -Hemoccult negative -Ferritin 273 -Vit B 12 1252  -Suspect was dehydrated when she came in so the IVF caused volume dilution -Transfused 1 unit PRBCs with stabilization of Hgb and improvement in dizziness complaint   DM type 2 -Ha1c 6.7  -Continue glipizide  HTN -DC lisinopril -Start metoprolol   HLD -Continue lipitor       Discharge Instructions      Discharge Instructions    Ambulatory referral to Nephrology   Complete by: As directed   Lab Results  Component Value Date   WBC 8.4 04/10/2019   HGB 9.0 (L) 04/10/2019   HCT 26.9 (L) 04/10/2019   MCV 86.8 04/10/2019   PLT 189 04/10/2019   Lab Results  Component Value Date   CREATININE 1.64 (H) 04/10/2019   BUN 25 (H) 04/10/2019   NA 140 04/10/2019   K 4.0 04/10/2019   CL 112 (H) 04/10/2019   CO2 21 (L) 04/10/2019   BP Readings from Last 3 Encounters:  05/04/19 (!) 193/74  04/10/19 (!) 174/61  04/06/19 Marland Kitchen)  164/74   Rachael Burke 63 y.o.   Chief Complaint  Patient presents with  . Dizziness    FOLLOW UP 1 MONTH    HISTORY OF PRESENT ILLNESS: This is a 63 y.o. female with multiple medical problems recently in the hospital.  Discharge summary reviewed. Today feels a lot better.  Dizziness not as frequent as before. #1 chronic kidney  disease: Needs nephrology referral. 2.  Chronic anemia secondary to #1.  Given transfusion while in the hospital. 3.  Hypertension: Presently on metoprolol 25 mg twice a day but taking it only once a day.  Advised to take it twice a day as prescribed. 4.  Diabetes: Presently on glipizide 5 mg once a day. 5.  Dyslipidemia: Was prescribed Lipitor 20 mg but not taking it. Today has no complaints.  Feels "much better".  Daughter with her in the room.  HPI   Prior to Admission medications   Medication Sig Start Date End Date Taking? Authorizing Provider  glipiZIDE (GLUCOTROL) 5 MG tablet Take 1 tablet (5 mg total) by mouth daily before breakfast. 04/06/19 07/05/19 Yes Sesilia Poucher, Ines Bloomer, MD  meclizine (ANTIVERT) 25 MG tablet Take 1 tablet (25 mg total) by mouth 3 (three) times daily as needed for dizziness. 04/06/19  Yes Iana Buzan, Ines Bloomer, MD  metoprolol tartrate (LOPRESSOR) 25 MG tablet Take 1 tablet (25 mg total) by mouth 2 (two) times daily. 04/10/19  Yes Dessa Phi, DO  atorvastatin (LIPITOR) 20 MG tablet Take 1 tablet (20 mg total) by mouth daily. Patient not taking: Reported on 05/04/2019 04/06/19   Horald Pollen, MD    No Known Allergies  Patient Active Problem List   Diagnosis Date Noted  . Anemia 04/08/2019  . HLD (hyperlipidemia) 04/08/2019  . AKI (acute kidney injury) ([place]) 04/07/2019  . Hypertension associated with diabetes ([place]) 04/06/2019  . Type 2 diabetes mellitus with complication, without long-term current use of insulin ([place]) 01/04/2018    Past Medical History:  Diagnosis Date  . Anemia   . Anxiety   . Diabetes mellitus without complication [facility])     Past Surgical History:  Procedure Laterality Date  . CESAREAN SECTION      Social History   Socioeconomic History  . Marital status: Married    Spouse name: Not on file  . Number of children: Not on file  . Years of education: Not on file  . Highest education level: Not on file  Occupational  History  . Not on file  Social Needs  . Financial resource strain: Not on file  . Food insecurity    Worry: Not on file    Inability: Not on file  . Transportation needs    Medical: Not on file    Non-medical: Not on file  Tobacco Use  . Smoking status: Never Smoker  . Smokeless tobacco: Never Used  Substance and Sexual Activity  . Alcohol use: Never    Frequency: Never  . Drug use: Never  . Sexual activity: Not on file  Lifestyle  . Physical activity    Days per week: Not on file    Minutes per session: Not on file  . Stress: Not on file  Relationships  . Social Herbalist on phone: Not on file    Gets together: Not on file    Attends religious service: Not on file    Active member of club or organization: Not on file    Attends meetings of clubs or organizations:  Not on file    Relationship status: Not on file  . Intimate partner violence    Fear of current or ex partner: Not on file    Emotionally abused: Not on file    Physically abused: Not on file    Forced sexual activity: Not on file  Other Topics Concern  . Not on file  Social History Narrative  . Not on file    Family History  Problem Relation Age of Onset  . Diabetes Mother   . Hypertension Father   . Diabetes Sister   . Diabetes Brother   . Diabetes Brother      Review of Systems  Constitutional: Negative.  Negative for chills and fever.  HENT: Negative.  Negative for congestion and sore throat.   Respiratory: Negative.  Negative for cough and shortness of breath.   Cardiovascular: Negative.  Negative for chest pain and palpitations.  Gastrointestinal: Positive for abdominal pain (Occasional abdominal pain). Negative for diarrhea, nausea and vomiting.  Genitourinary: Negative.        States she is still urinating a lot  Musculoskeletal: Negative.  Negative for myalgias.  Skin: Negative.  Negative for rash.  Neurological: Positive for dizziness (Occasional episodes). Negative for  headaches.  All other systems reviewed and are negative.  Vitals:   05/04/19 1029  BP: (!) 193/74  Pulse: 72  Resp: 16  Temp: 98 F (36.7 C)  SpO2: 99%     Physical Exam Vitals signs reviewed.  HENT:     Head: Normocephalic.  Eyes:     Extraocular Movements: Extraocular movements intact.     Conjunctiva/sclera: Conjunctivae normal.     Pupils: Pupils are equal, round, and reactive to light.  Neck:     Musculoskeletal: Normal range of motion and neck supple.  Cardiovascular:     Rate and Rhythm: Normal rate and regular rhythm.     Pulses: Normal pulses.     Heart sounds: Normal heart sounds.  Pulmonary:     Effort: Pulmonary effort is normal.     Breath sounds: Normal breath sounds.  Abdominal:     General: There is no distension.     Palpations: Abdomen is soft. There is no mass.     Tenderness: There is no abdominal tenderness. There is no guarding or rebound.  Musculoskeletal: Normal range of motion.  Skin:    General: Skin is warm and dry.     Capillary Refill: Capillary refill takes less than 2 seconds.  Neurological:     General: No focal deficit present.     Mental Status: She is alert and oriented to person, place, and time.  Psychiatric:        Mood and Affect: Mood normal.        Behavior: Behavior normal.      ASSESSMENT & PLAN: Leya was seen today for dizziness.  Diagnoses and all orders for this visit:  Hypertension associated with diabetes ([place])  Stage 3b chronic kidney disease -     CBC with Differential -     Basic metabolic panel -     Ambulatory referral to Nephrology  Benign paroxysmal positional vertigo of right ear -     meclizine (ANTIVERT) 25 MG tablet; Take 1 tablet (25 mg total) by mouth 3 (three) times daily as needed for dizziness.    Patient Instructions       If you have lab work done today you will be contacted with your lab results within the next  2 weeks.  If you have not heard from [place] then please contact us. The  fastest way to get your results is to register for My Chart.   IF you received an x-ray today, you will receive an invoice from [facility]. Please contact [facility] at [phone] with questions or concerns regarding your invoice.   IF you received labwork today, you will receive an invoice from [place]. Please contact [facility] at [phone] with questions or concerns regarding your invoice.   Our billing staff will not be able to assist you with questions regarding bills from these companies.  You will be contacted with the lab results as soon as they are available. The fastest way to get your results is to activate your My Chart account. Instructions are located on the last page of this paperwork. If you have not heard from [place] regarding the results in 2 weeks, please contact this office.     Enfermedad renal crnica en los adultos Chronic Kidney Disease, Adult La enfermedad renal crnica (ERC) ocurre cuando los riones sufren daos durante un largo perodo. Los riones son dos rganos que ayudan a lo siguiente:  [employer] desechos y el exceso de lquido de Herbalist.  Producir hormonas que [employer] cantidad de lquido [employer] tejidos y los vasos sanguneos.  Mantener la cantidad correcta de lquidos y de sustancias qumicas en el organismo. [place] veces, la ERC no desaparece; sin embargo, con frecuencia, puede controlarse. Se deben tomar medidas para detener el dao renal o evitar que empeore. De otro modo, los riones podran dejar de funcionar. Siga estas indicaciones en su casa: Medicamentos  [employer] de venta libre y los recetados solamente como se lo haya indicado el mdico. Es posible que deba modificar la cantidad de medicamentos que toma.  No tome ningn medicamento nuevo, excepto que el mdico lo autorice. Muchos medicamentos pueden empeorar el dao renal.  No tome vitaminas ni suplementos, excepto que el mdico lo  autorice. Muchas vitaminas y suplementos pueden empeorar el dao renal. Instrucciones generales  Siga la dieta como se lo haya indicado el mdico. Es posible que no pueda consumir lo siguiente: ? Alcohol. ? Alimentos salados. ? Alimentos ricos en lo siguiente:  [place].  Calcio.  Protena.  No consuma ningn producto que contenga nicotina o tabaco, como cigarrillos y Psychologist, sport and exercise. Si necesita ayuda para dejar de fumar, consulte al mdico.  Lleve un registro de la presin arterial en su hogar. Informe al mdico si hay algn cambio.  Si tiene diabetes, registre Retail buyer de nivel de glucosa en la sangre como se lo haya indicado el mdico.  Intente mantener un peso saludable. Si necesita ayuda, consulte al [employer].  Sherilyn Cooter ejercicios al menos 39minutos al da, 5das a la semana.  Bureau se lo haya indicado el mdico.  Concurra a todas las visitas de seguimiento como se lo haya indicado el mdico. Esto es importante. Comunquese con un mdico si:  Los sntomas empeoran.  Aparecen nuevos sntomas. Solicite ayuda de inmediato si:  Tiene sntomas de enfermedad renal terminal. Estos pueden incluir lo siguiente: ? Dolores de [place]. ? Adormecimiento de las manos o de los pies. ? Hematomas que aparecen con facilidad. ? Hipo frecuente. ? Dolor en el pecho. ? Falta de aire. ? Ausencia de la [employer].  Tiene fiebre.  Hace muy poco pis (orina).  Siente dolor o tiene una hemorragia al [employer]. Resumen  La enfermedad renal crnica (ERC) ocurre cuando los riones sufren daos durante un largo perodo.  [place] veces, esta afeccin no desaparece, pero a menudo puede ser [employer]. Se deben tomar medidas para detener el dao renal o evitar que empeore.  El tratamiento puede incluir una combinacin de medicamentos y cambios en el estilo de vida. Esta informacin no tiene Marine scientist el consejo del  mdico. Asegrese de hacerle al mdico cualquier pregunta que tenga. Document Released: 06/20/2010 Document Revised: 01/05/2017 Document Reviewed: 01/05/2017 Elsevier Patient Education  2020 Elsevier Inc.      Agustina Caroli, MD [facility]

## 2019-05-05 LAB — CBC WITH DIFFERENTIAL/PLATELET
Basophils Absolute: 0 10*3/uL (ref 0.0–0.2)
Basos: 0 %
EOS (ABSOLUTE): 0.2 10*3/uL (ref 0.0–0.4)
Eos: 2 %
Hematocrit: 27.5 % — ABNORMAL LOW (ref 34.0–46.6)
Hemoglobin: 9.2 g/dL — ABNORMAL LOW (ref 11.1–15.9)
Immature Grans (Abs): 0 10*3/uL (ref 0.0–0.1)
Immature Granulocytes: 0 %
Lymphocytes Absolute: 1.6 10*3/uL (ref 0.7–3.1)
Lymphs: 17 %
MCH: 29.6 pg (ref 26.6–33.0)
MCHC: 33.5 g/dL (ref 31.5–35.7)
MCV: 88 fL (ref 79–97)
Monocytes Absolute: 0.7 10*3/uL (ref 0.1–0.9)
Monocytes: 7 %
Neutrophils Absolute: 6.8 10*3/uL (ref 1.4–7.0)
Neutrophils: 74 %
Platelets: 201 10*3/uL (ref 150–450)
RBC: 3.11 x10E6/uL — ABNORMAL LOW (ref 3.77–5.28)
RDW: 13.2 % (ref 11.7–15.4)
WBC: 9.3 10*3/uL (ref 3.4–10.8)

## 2019-05-05 LAB — BASIC METABOLIC PANEL
BUN/Creatinine Ratio: 15 (ref 12–28)
BUN: 26 mg/dL (ref 8–27)
CO2: 20 mmol/L (ref 20–29)
Calcium: 8.7 mg/dL (ref 8.7–10.3)
Chloride: 106 mmol/L (ref 96–106)
Creatinine, Ser: 1.74 mg/dL — ABNORMAL HIGH (ref 0.57–1.00)
GFR calc Af Amer: 35 mL/min/{1.73_m2} — ABNORMAL LOW (ref >59)
GFR calc non Af Amer: 31 mL/min/{1.73_m2} — ABNORMAL LOW (ref >59)
Glucose: 154 mg/dL — ABNORMAL HIGH (ref 65–99)
Potassium: 5.3 mmol/L — ABNORMAL HIGH (ref 3.5–5.2)
Sodium: 141 mmol/L (ref 134–144)

## 2019-05-11 ENCOUNTER — Encounter: Payer: Self-pay | Admitting: Emergency Medicine

## 2019-05-11 ENCOUNTER — Ambulatory Visit (INDEPENDENT_AMBULATORY_CARE_PROVIDER_SITE_OTHER): Payer: Self-pay | Admitting: Emergency Medicine

## 2019-05-11 ENCOUNTER — Other Ambulatory Visit: Payer: Self-pay

## 2019-05-11 VITALS — BP 189/83 | HR 78 | Temp 98.2°F | Resp 16 | Ht 63.0 in | Wt 162.0 lb

## 2019-05-11 DIAGNOSIS — I10 Essential (primary) hypertension: Secondary | ICD-10-CM

## 2019-05-11 DIAGNOSIS — N1832 Chronic kidney disease, stage 3b: Secondary | ICD-10-CM

## 2019-05-11 DIAGNOSIS — E1159 Type 2 diabetes mellitus with other circulatory complications: Secondary | ICD-10-CM

## 2019-05-11 DIAGNOSIS — I152 Hypertension secondary to endocrine disorders: Secondary | ICD-10-CM

## 2019-05-11 MED ORDER — LISINOPRIL 20 MG PO TABS: 20.0000 mg | ORAL_TABLET | Freq: Every day | ORAL | 3 refills | Status: DC

## 2019-05-11 NOTE — Progress Notes (Signed)
Rachael Burke 63 y.o.   Chief Complaint  Patient presents with  . Dizziness    has gotten worse since OV on Thursday 05/04/2019    HISTORY OF PRESENT ILLNESS: This is a 63 y.o. female complaining of dizziness for the past several days.  A little worse than before.  Has a history of chronic dizziness.  Also has a history of diabetes associated with hypertension.  Systolic blood pressure has been high at home.  Denies headache or neurological symptoms.  Denies chest pain or difficulty breathing.  No new medications or new diets.  No head injuries or falls. Denies nausea or vomiting.  Denies abdominal pain, diarrhea, or rectal bleeding.  HPI   Prior to Admission medications   Medication Sig Start Date End Date Taking? Authorizing Provider  glipiZIDE (GLUCOTROL) 5 MG tablet Take 1 tablet (5 mg total) by mouth daily before breakfast. 04/06/19 07/05/19 Yes Maicy Filip, Ines Bloomer, MD  meclizine (ANTIVERT) 25 MG tablet Take 1 tablet (25 mg total) by mouth 3 (three) times daily as needed for dizziness. 05/04/19  Yes Gerlean Cid, Ines Bloomer, MD  metoprolol tartrate (LOPRESSOR) 25 MG tablet Take 1 tablet (25 mg total) by mouth 2 (two) times daily. 04/10/19  Yes Dessa Phi, DO  atorvastatin (LIPITOR) 20 MG tablet Take 1 tablet (20 mg total) by mouth daily. Patient not taking: Reported on 05/11/2019 04/06/19   Horald Pollen, MD    No Known Allergies  Patient Active Problem List   Diagnosis Date Noted  . Anemia 04/08/2019  . HLD (hyperlipidemia) 04/08/2019  . AKI (acute kidney injury) ([place]) 04/07/2019  . Hypertension associated with diabetes ([place]) 04/06/2019  . Type 2 diabetes mellitus with complication, without long-term current use of insulin ([place]) 01/04/2018    Past Medical History:  Diagnosis Date  . Anemia   . Anxiety   . Diabetes mellitus without complication [facility])     Past Surgical History:  Procedure Laterality Date  . CESAREAN SECTION      Social History    Socioeconomic History  . Marital status: Married    Spouse name: Not on file  . Number of children: Not on file  . Years of education: Not on file  . Highest education level: Not on file  Occupational History  . Not on file  Tobacco Use  . Smoking status: Never Smoker  . Smokeless tobacco: Never Used  Substance and Sexual Activity  . Alcohol use: Never  . Drug use: Never  . Sexual activity: Not on file  Other Topics Concern  . Not on file  Social History Narrative  . Not on file   Social Determinants of Health   Financial Resource Strain:   . Difficulty of Paying Living Expenses: Not on file  Food Insecurity:   . Worried About Charity fundraiser in the Last Year: Not on file  . Ran Out of Food in the Last Year: Not on file  Transportation Needs:   . Lack of Transportation (Medical): Not on file  . Lack of Transportation (Non-Medical): Not on file  Physical Activity:   . Days of Exercise per Week: Not on file  . Minutes of Exercise per Session: Not on file  Stress:   . Feeling of Stress : Not on file  Social Connections:   . Frequency of Communication with Friends and Family: Not on file  . Frequency of Social Gatherings with Friends and Family: Not on file  . Attends Religious Services: Not on file  .  Active Member of Clubs or Organizations: Not on file  . Attends Archivist Meetings: Not on file  . Marital Status: Not on file  Intimate Partner Violence:   . Fear of Current or Ex-Partner: Not on file  . Emotionally Abused: Not on file  . Physically Abused: Not on file  . Sexually Abused: Not on file    Family History  Problem Relation Age of Onset  . Diabetes Mother   . Hypertension Father   . Diabetes Sister   . Diabetes Brother   . Diabetes Brother      Review of Systems  Constitutional: Negative.  Negative for chills and fever.  HENT: Negative.  Negative for congestion and sore throat.   Respiratory: Negative.  Negative for cough,  hemoptysis and shortness of breath.   Cardiovascular: Negative.  Negative for chest pain and palpitations.  Gastrointestinal: Negative for abdominal pain, blood in stool, diarrhea, nausea and vomiting.  Genitourinary: Negative.  Negative for dysuria and hematuria.  Skin: Negative for rash.  Neurological: Positive for dizziness. Negative for sensory change, speech change, focal weakness, seizures, loss of consciousness and weakness.   Vitals:   05/11/19 1627  BP: (!) 189/83  Pulse: 78  Resp: 16  Temp: 98.2 F (36.8 C)  SpO2: 100%     Physical Exam Vitals reviewed.  Constitutional:      Appearance: Normal appearance.  HENT:     Head: Normocephalic.  Eyes:     Extraocular Movements: Extraocular movements intact.     Pupils: Pupils are equal, round, and reactive to light.  Cardiovascular:     Rate and Rhythm: Normal rate and regular rhythm.     Pulses: Normal pulses.     Heart sounds: Normal heart sounds.  Pulmonary:     Effort: Pulmonary effort is normal.     Breath sounds: Normal breath sounds.  Abdominal:     General: There is no distension.     Palpations: Abdomen is soft.     Tenderness: There is no abdominal tenderness.  Musculoskeletal:        General: Normal range of motion.     Cervical back: Normal range of motion and neck supple.  Skin:    General: Skin is warm and dry.     Capillary Refill: Capillary refill takes less than 2 seconds.  Neurological:     General: No focal deficit present.     Mental Status: She is alert and oriented to person, place, and time.     Cranial Nerves: No cranial nerve deficit.     Sensory: No sensory deficit.     Motor: No weakness.     Coordination: Coordination normal.     Gait: Gait normal.  Psychiatric:        Mood and Affect: Mood normal.        Behavior: Behavior normal.      ASSESSMENT & PLAN: Rachael Burke was seen today for dizziness.  Diagnoses and all orders for this visit:  Hypertension associated with diabetes  ([place]) -     lisinopril (ZESTRIL) 20 MG tablet; Take 1 tablet (20 mg total) by mouth daily.  Stage 3b chronic kidney disease    Patient Instructions       If you have lab work done today you will be contacted with your lab results within the next 2 weeks.  If you have not heard from [place] then please contact us. The fastest way to get your results is to register for My  Chart.   IF you received an x-ray today, you will receive an invoice from [facility] Radiology. Please contact [facility] Radiology at [phone] with questions or concerns regarding your invoice.   IF you received labwork today, you will receive an invoice from [facility]. Please contact [facility] at [phone] with questions or concerns regarding your invoice.   Our billing staff will not be able to assist you with questions regarding bills from these companies.  You will be contacted with the lab results as soon as they are available. The fastest way to get your results is to activate your My Chart account. Instructions are located on the last page of this paperwork. If you have not heard from [place] regarding the results in 2 weeks, please contact this office.     Hipertensin en los adultos Hypertension, Adult El trmino hipertensin es otra forma de denominar a la presin arterial elevada. La presin arterial elevada fuerza al corazn a trabajar ms para bombear la sangre. Esto puede causar problemas con el paso del [place]. Una lectura de presin arterial est compuesta por 2 nmeros. Hay un nmero superior (sistlico) sobre un nmero inferior (diastlico). Lo ideal es tener la presin arterial por debajo de 120/80. Las elecciones saludables pueden ayudar a Engineer, materials presin arterial, o tal vez necesite medicamentos para bajarla. Cules son las causas? Se desconoce la causa de esta afeccin. Algunas afecciones pueden estar relacionadas con la presin arterial alta. Qu incrementa el riesgo?  Fumar.  Tener diabetes  mellitus tipo 2, colesterol alto, o ambos.  No hacer la cantidad suficiente de actividad fsica o ejercicio.  Tener sobrepeso.  Consumir mucha grasa, azcar, caloras o sal (sodio) en su dieta.  Beber alcohol en exceso.  Tener una enfermedad renal a largo plazo (crnica).  Tener antecedentes familiares de presin arterial alta.  Edad. Los riesgos aumentan con la edad.  Raza. El riesgo es mayor para las Retail banker.  Sexo. Antes de los 45aos, los hombres corren ms [employer]. Despus de los 65aos, las mujeres corren ms [employer].  Tener apnea obstructiva del sueo.  Estrs. Cules son los signos o los sntomas?  Es posible que la presin arterial alta puede no cause sntomas. La presin arterial muy alta (crisis hipertensiva) puede provocar: ? Dolor de [place]. ? Sensaciones de preocupacin o nerviosismo (ansiedad). ? Falta de aire. ? Hemorragia nasal. ? Sensacin de Engineer, site (nuseas). ? Vmitos. ? Cambios en la forma de ver. ? Dolor muy intenso en el pecho. ? Convulsiones. Cmo se trata?  Esta afeccin se trata haciendo cambios saludables en el estilo de vida, por ejemplo: ? Consumir alimentos saludables. ? Hacer ms ejercicio. ? Beber menos alcohol.  El mdico puede recetarle medicamentos si los cambios en el estilo de vida no son suficientes para Child psychotherapist la presin arterial y si: ? El nmero de arriba est por encima de 130. ? El nmero de abajo est por encima de 80.  Su presin arterial personal ideal puede variar. Siga estas instrucciones en su casa: Comida y bebida   Si se lo dicen, siga el plan de alimentacin de DASH (Dietary Approaches to Stop Hypertension, Maneras de alimentarse para detener la hipertensin). Para seguir este plan: ? Llene la mitad del plato de cada comida con frutas y verduras. ? Llene un cuarto del plato de cada comida con cereales integrales. Los cereales integrales  incluyen pasta integral, arroz integral y pan integral. ? Coma y beba productos lcteos con  bajo contenido de [place], como leche descremada o yogur bajo en grasas. ? Llene un cuarto del plato de cada comida con protenas bajas en grasa (magras). Las protenas bajas en grasa incluyen pescado, pollo sin piel, huevos, frijoles y tofu. ? Evite consumir carne grasa, carne curada y procesada, o pollo con piel. ? Evite consumir alimentos prehechos o procesados.  Consuma menos de 1500 mg de sal por da.  No beba alcohol si: ? El mdico le indica que no lo haga. ? Est embarazada, puede estar embarazada o est tratando de quedar embarazada.  Si bebe alcohol: ? Limite la cantidad que bebe a lo siguiente:  De 0 a 1 medida por da para las mujeres.  De 0 a 2 medidas por da para los hombres. ? Est atento a la cantidad de alcohol que hay en las bebidas que toma. En los [place], una medida equivale a una botella de cerveza de 12oz (327ml), un vaso de vino de 5oz (153ml) o un vaso de una bebida alcohlica de alta graduacin de 1oz (102ml). Estilo de vida   Trabaje con su mdico para mantenerse en un peso saludable o para perder peso. Pregntele a su mdico cul es el peso recomendable para usted.  Haga al menos 29minutos de ejercicio la [employer] de la [place]. Estos pueden incluir caminar, nadar o andar en bicicleta.  Realice al menos 30 minutos de ejercicio que fortalezca sus msculos (ejercicios de resistencia) al menos 3 das a la [place]. Estos pueden incluir levantar pesas o hacer Pilates.  No consuma ningn producto que contenga nicotina o tabaco, como cigarrillos, cigarrillos electrnicos y tabaco de Higher education careers adviser. Si necesita ayuda para dejar de fumar, consulte al [employer].  Controle su presin arterial en su casa tal como le indic el mdico.  Concurra a todas las visitas de seguimiento como se lo haya indicado el mdico. Esto es importante. Medicamentos  [employer] de venta libre y los recetados solamente como se lo haya indicado el mdico. Siga cuidadosamente las indicaciones.  No omita las dosis de medicamentos para la presin arterial. Los medicamentos pierden eficacia si omite dosis. El hecho de omitir las dosis tambin [place] el riesgo de otros problemas.  Pregntele a su mdico a qu efectos secundarios o reacciones a los Careers information officer. Comunquese con un mdico si:  Piensa que tiene [place] reaccin a los medicamentos que est tomando.  Tiene dolores de cabeza frecuentes (recurrentes).  Se siente mareado.  Tiene hinchazn en los tobillos.  Tiene problemas de visin. Solicite ayuda inmediatamente si:  Siente un dolor de cabeza muy intenso.  Empieza a sentirse desorientado (confundido).  Se siente dbil o adormecido.  Siente que va a desmayarse.  Tiene un dolor muy intenso en las siguientes zonas: ? Pecho. ? Vientre (abdomen).  Vomita ms de una vez.  Tiene dificultad para respirar. Resumen  El trmino hipertensin es otra forma de denominar a la presin arterial elevada.  La presin arterial elevada fuerza al corazn a trabajar ms para bombear la sangre.  Para la [employer], una presin arterial normal es menor que 120/80.  Las decisiones saludables pueden ayudarle a disminuir su presin arterial. Si no puede bajar su presin arterial mediante decisiones saludables, es posible que deba tomar medicamentos. Esta informacin no tiene Marine scientist el consejo del mdico. Asegrese de hacerle al mdico cualquier pregunta que tenga. Document Released: 11/05/2009 Document Revised: 03/03/2018 Document Reviewed: 03/03/2018 Elsevier Patient Education  2020 Elsevier  Inc.      Agustina Caroli, MD [facility]

## 2019-05-11 NOTE — Patient Instructions (Addendum)
If you have lab work done today you will be contacted with your lab results within the next 2 weeks.  If you have not heard from [place] then please contact us. The fastest way to get your results is to register for My Chart.   IF you received an x-ray today, you will receive an invoice from [facility] Radiology. Please contact [facility] Radiology at [phone] with questions or concerns regarding your invoice.   IF you received labwork today, you will receive an invoice from [place]. Please contact [facility] at [phone] with questions or concerns regarding your invoice.   Our billing staff will not be able to assist you with questions regarding bills from these companies.  You will be contacted with the lab results as soon as they are available. The fastest way to get your results is to activate your My Chart account. Instructions are located on the last page of this paperwork. If you have not heard from [place] regarding the results in 2 weeks, please contact this office.     Hipertensin en los adultos Hypertension, Adult El trmino hipertensin es otra forma de denominar a la presin arterial elevada. La presin arterial elevada fuerza al corazn a trabajar ms para bombear la sangre. Esto puede causar problemas con el paso del [place]. Una lectura de presin arterial est compuesta por 2 nmeros. Hay un nmero superior (sistlico) sobre un nmero inferior (diastlico). Lo ideal es tener la presin arterial por debajo de 120/80. Las elecciones saludables pueden ayudar a Engineer, materials presin arterial, o tal vez necesite medicamentos para bajarla. Cules son las causas? Se desconoce la causa de esta afeccin. Algunas afecciones pueden estar relacionadas con la presin arterial alta. Qu incrementa el riesgo?  Fumar.  Tener diabetes mellitus tipo 2, colesterol alto, o ambos.  No hacer la cantidad suficiente de actividad fsica o ejercicio.  Tener sobrepeso.  Consumir mucha grasa,  azcar, caloras o sal (sodio) en su dieta.  Beber alcohol en exceso.  Tener una enfermedad renal a largo plazo (crnica).  Tener antecedentes familiares de presin arterial alta.  Edad. Los riesgos aumentan con la edad.  Raza. El riesgo es mayor para las Retail banker.  Sexo. Antes de los 45aos, los hombres corren ms [employer]. Despus de los 65aos, las mujeres corren ms [employer].  Tener apnea obstructiva del sueo.  Estrs. Cules son los signos o los sntomas?  Es posible que la presin arterial alta puede no cause sntomas. La presin arterial muy alta (crisis hipertensiva) puede provocar: ? Dolor de [place]. ? Sensaciones de preocupacin o nerviosismo (ansiedad). ? Falta de aire. ? Hemorragia nasal. ? Sensacin de Engineer, site (nuseas). ? Vmitos. ? Cambios en la forma de ver. ? Dolor muy intenso en el pecho. ? Convulsiones. Cmo se trata?  Esta afeccin se trata haciendo cambios saludables en el estilo de vida, por ejemplo: ? Consumir alimentos saludables. ? Hacer ms ejercicio. ? Beber menos alcohol.  El mdico puede recetarle medicamentos si los cambios en el estilo de vida no son suficientes para Child psychotherapist la presin arterial y si: ? El nmero de arriba est por encima de 130. ? El nmero de abajo est por encima de 80.  Su presin arterial personal ideal puede variar. Siga estas instrucciones en su casa: Comida y bebida   Si se lo dicen, siga el plan de alimentacin de DASH (Dietary Approaches to Stop Hypertension, Maneras de alimentarse para detener la hipertensin). Para  seguir este plan: ? Llene la mitad del plato de cada comida con frutas y verduras. ? Llene un cuarto del plato de cada comida con cereales integrales. Los cereales integrales incluyen pasta integral, arroz integral y pan integral. ? Coma y beba productos lcteos con bajo contenido de grasa, como leche descremada o yogur bajo en  grasas. ? Llene un cuarto del plato de cada comida con protenas bajas en grasa (magras). Las protenas bajas en grasa incluyen pescado, pollo sin piel, huevos, frijoles y tofu. ? Evite consumir carne grasa, carne curada y procesada, o pollo con piel. ? Evite consumir alimentos prehechos o procesados.  Consuma menos de 1500 mg de sal por da.  No beba alcohol si: ? El mdico le indica que no lo haga. ? Est embarazada, puede estar embarazada o est tratando de quedar embarazada.  Si bebe alcohol: ? Limite la cantidad que bebe a lo siguiente:  De 0 a 1 medida por da para las mujeres.  De 0 a 2 medidas por da para los hombres. ? Est atento a la cantidad de alcohol que hay en las bebidas que toma. En los [place], una medida equivale a una botella de cerveza de 12oz (347ml), un vaso de vino de 5oz (137ml) o un vaso de una bebida alcohlica de alta graduacin de 1oz (14ml). Estilo de vida   Trabaje con su mdico para mantenerse en un peso saludable o para perder peso. Pregntele a su mdico cul es el peso recomendable para usted.  Haga al menos 48minutos de ejercicio la [employer] de la [place]. Estos pueden incluir caminar, nadar o andar en bicicleta.  Realice al menos 30 minutos de ejercicio que fortalezca sus msculos (ejercicios de resistencia) al menos 3 das a la [place]. Estos pueden incluir levantar pesas o hacer Pilates.  No consuma ningn producto que contenga nicotina o tabaco, como cigarrillos, cigarrillos electrnicos y tabaco de Higher education careers adviser. Si necesita ayuda para dejar de fumar, consulte al [employer].  Controle su presin arterial en su casa tal como le indic el mdico.  Concurra a todas las visitas de seguimiento como se lo haya indicado el mdico. Esto es importante. Medicamentos  [employer] de venta libre y los recetados solamente como se lo haya indicado el mdico. Siga cuidadosamente las indicaciones.  No omita las dosis de medicamentos  para la presin arterial. Los medicamentos pierden eficacia si omite dosis. El hecho de omitir las dosis tambin [place] el riesgo de otros problemas.  Pregntele a su mdico a qu efectos secundarios o reacciones a los Careers information officer. Comunquese con un mdico si:  Piensa que tiene [place] reaccin a los medicamentos que est tomando.  Tiene dolores de cabeza frecuentes (recurrentes).  Se siente mareado.  Tiene hinchazn en los tobillos.  Tiene problemas de visin. Solicite ayuda inmediatamente si:  Siente un dolor de cabeza muy intenso.  Empieza a sentirse desorientado (confundido).  Se siente dbil o adormecido.  Siente que va a desmayarse.  Tiene un dolor muy intenso en las siguientes zonas: ? Pecho. ? Vientre (abdomen).  Vomita ms de una vez.  Tiene dificultad para respirar. Resumen  El trmino hipertensin es otra forma de denominar a la presin arterial elevada.  La presin arterial elevada fuerza al corazn a trabajar ms para bombear la sangre.  Para la [employer], una presin arterial normal es menor que 120/80.  Las decisiones saludables pueden ayudarle a disminuir su presin arterial. Si no puede bajar  su presin arterial mediante decisiones saludables, es posible que deba tomar medicamentos. Esta informacin no tiene Marine scientist el consejo del mdico. Asegrese de hacerle al mdico cualquier pregunta que tenga. Document Released: 11/05/2009 Document Revised: 03/03/2018 Document Reviewed: 03/03/2018 Elsevier Patient Education  2020 [employer].

## 2019-08-03 ENCOUNTER — Ambulatory Visit: Payer: Self-pay | Admitting: Emergency Medicine

## 2019-12-28 ENCOUNTER — Encounter (HOSPITAL_COMMUNITY): Payer: Self-pay

## 2019-12-28 ENCOUNTER — Inpatient Hospital Stay (HOSPITAL_COMMUNITY)
Admission: EM | Admit: 2019-12-28 | Discharge: 2020-01-01 | DRG: 683 | Disposition: A | Discharge status: Home / Self Care | Payer: Self-pay | Attending: Internal Medicine | Admitting: Internal Medicine

## 2019-12-28 ENCOUNTER — Other Ambulatory Visit: Payer: Self-pay

## 2019-12-28 ENCOUNTER — Ambulatory Visit (HOSPITAL_COMMUNITY)
Admission: EM | Admit: 2019-12-28 | Discharge: 2019-12-28 | Disposition: A | Discharge status: Home / Self Care | Payer: Self-pay | Attending: Family Medicine | Admitting: Family Medicine

## 2019-12-28 DIAGNOSIS — N185 Chronic kidney disease, stage 5: Secondary | ICD-10-CM | POA: Diagnosis present

## 2019-12-28 DIAGNOSIS — E118 Type 2 diabetes mellitus with unspecified complications: Secondary | ICD-10-CM | POA: Diagnosis present

## 2019-12-28 DIAGNOSIS — R5383 Other fatigue: Secondary | ICD-10-CM

## 2019-12-28 DIAGNOSIS — Z8249 Family history of ischemic heart disease and other diseases of the circulatory system: Secondary | ICD-10-CM

## 2019-12-28 DIAGNOSIS — N179 Acute kidney failure, unspecified: Principal | ICD-10-CM | POA: Diagnosis present

## 2019-12-28 DIAGNOSIS — D649 Anemia, unspecified: Secondary | ICD-10-CM

## 2019-12-28 DIAGNOSIS — E782 Mixed hyperlipidemia: Secondary | ICD-10-CM | POA: Diagnosis present

## 2019-12-28 DIAGNOSIS — E1122 Type 2 diabetes mellitus with diabetic chronic kidney disease: Secondary | ICD-10-CM | POA: Diagnosis present

## 2019-12-28 DIAGNOSIS — E875 Hyperkalemia: Secondary | ICD-10-CM | POA: Diagnosis present

## 2019-12-28 DIAGNOSIS — R600 Localized edema: Secondary | ICD-10-CM | POA: Diagnosis present

## 2019-12-28 DIAGNOSIS — E785 Hyperlipidemia, unspecified: Secondary | ICD-10-CM | POA: Diagnosis present

## 2019-12-28 DIAGNOSIS — I1311 Hypertensive heart and chronic kidney disease without heart failure, with stage 5 chronic kidney disease, or end stage renal disease: Secondary | ICD-10-CM | POA: Diagnosis present

## 2019-12-28 DIAGNOSIS — E8779 Other fluid overload: Secondary | ICD-10-CM

## 2019-12-28 DIAGNOSIS — N189 Chronic kidney disease, unspecified: Secondary | ICD-10-CM | POA: Diagnosis present

## 2019-12-28 DIAGNOSIS — D631 Anemia in chronic kidney disease: Secondary | ICD-10-CM | POA: Diagnosis present

## 2019-12-28 DIAGNOSIS — E1165 Type 2 diabetes mellitus with hyperglycemia: Secondary | ICD-10-CM | POA: Diagnosis present

## 2019-12-28 DIAGNOSIS — Z20822 Contact with and (suspected) exposure to covid-19: Secondary | ICD-10-CM | POA: Diagnosis present

## 2019-12-28 DIAGNOSIS — N049 Nephrotic syndrome with unspecified morphologic changes: Secondary | ICD-10-CM | POA: Diagnosis present

## 2019-12-28 DIAGNOSIS — E119 Type 2 diabetes mellitus without complications: Secondary | ICD-10-CM | POA: Diagnosis present

## 2019-12-28 DIAGNOSIS — M109 Gout, unspecified: Secondary | ICD-10-CM | POA: Diagnosis present

## 2019-12-28 DIAGNOSIS — Z833 Family history of diabetes mellitus: Secondary | ICD-10-CM

## 2019-12-28 HISTORY — DX: Disorder of kidney and ureter, unspecified: N28.9

## 2019-12-28 LAB — CBC WITH DIFFERENTIAL/PLATELET
Abs Immature Granulocytes: 0.04 10*3/uL (ref 0.00–0.07)
Basophils Absolute: 0 10*3/uL (ref 0.0–0.1)
Basophils Relative: 0 %
Eosinophils Absolute: 0.2 10*3/uL (ref 0.0–0.5)
Eosinophils Relative: 3 %
HCT: 20.2 % — ABNORMAL LOW (ref 36.0–46.0)
Hemoglobin: 6.4 g/dL — CL (ref 12.0–15.0)
Immature Granulocytes: 1 %
Lymphocytes Relative: 16 %
Lymphs Abs: 1.3 10*3/uL (ref 0.7–4.0)
MCH: 28.6 pg (ref 26.0–34.0)
MCHC: 31.7 g/dL (ref 30.0–36.0)
MCV: 90.2 fL (ref 80.0–100.0)
Monocytes Absolute: 0.6 10*3/uL (ref 0.1–1.0)
Monocytes Relative: 8 %
Neutro Abs: 5.6 10*3/uL (ref 1.7–7.7)
Neutrophils Relative %: 72 %
Platelets: 224 10*3/uL (ref 150–400)
RBC: 2.24 MIL/uL — ABNORMAL LOW (ref 3.87–5.11)
RDW: 13.7 % (ref 11.5–15.5)
WBC: 7.8 10*3/uL (ref 4.0–10.5)
nRBC: 0 % (ref 0.0–0.2)

## 2019-12-28 LAB — CBC
HCT: 20.7 % — ABNORMAL LOW (ref 36.0–46.0)
Hemoglobin: 6.4 g/dL — CL (ref 12.0–15.0)
MCH: 28.6 pg (ref 26.0–34.0)
MCHC: 30.9 g/dL (ref 30.0–36.0)
MCV: 92.4 fL (ref 80.0–100.0)
Platelets: 224 10*3/uL (ref 150–400)
RBC: 2.24 MIL/uL — ABNORMAL LOW (ref 3.87–5.11)
RDW: 13.9 % (ref 11.5–15.5)
WBC: 8.1 10*3/uL (ref 4.0–10.5)
nRBC: 0 % (ref 0.0–0.2)

## 2019-12-28 LAB — COMPREHENSIVE METABOLIC PANEL
ALT: 15 U/L (ref 0–44)
ALT: 14 U/L (ref 0–44)
AST: 11 U/L — ABNORMAL LOW (ref 15–41)
AST: 13 U/L — ABNORMAL LOW (ref 15–41)
Albumin: 3.1 g/dL — ABNORMAL LOW (ref 3.5–5.0)
Albumin: 3.1 g/dL — ABNORMAL LOW (ref 3.5–5.0)
Alkaline Phosphatase: 77 U/L (ref 38–126)
Alkaline Phosphatase: 79 U/L (ref 38–126)
Anion gap: 8 (ref 5–15)
Anion gap: 9 (ref 5–15)
BUN: 83 mg/dL — ABNORMAL HIGH (ref 8–23)
BUN: 83 mg/dL — ABNORMAL HIGH (ref 8–23)
CO2: 19 mmol/L — ABNORMAL LOW (ref 22–32)
CO2: 17 mmol/L — ABNORMAL LOW (ref 22–32)
Calcium: 8 mg/dL — ABNORMAL LOW (ref 8.9–10.3)
Calcium: 8 mg/dL — ABNORMAL LOW (ref 8.9–10.3)
Chloride: 111 mmol/L (ref 98–111)
Chloride: 111 mmol/L (ref 98–111)
Creatinine, Ser: 4.85 mg/dL — ABNORMAL HIGH (ref 0.44–1.00)
Creatinine, Ser: 4.75 mg/dL — ABNORMAL HIGH (ref 0.44–1.00)
GFR calc Af Amer: 10 mL/min — ABNORMAL LOW (ref >60)
GFR calc Af Amer: 10 mL/min — ABNORMAL LOW (ref >60)
GFR calc non Af Amer: 9 mL/min — ABNORMAL LOW (ref >60)
GFR calc non Af Amer: 9 mL/min — ABNORMAL LOW (ref >60)
Glucose, Bld: 133 mg/dL — ABNORMAL HIGH (ref 70–99)
Glucose, Bld: 119 mg/dL — ABNORMAL HIGH (ref 70–99)
Potassium: 5.6 mmol/L — ABNORMAL HIGH (ref 3.5–5.1)
Potassium: 5.7 mmol/L — ABNORMAL HIGH (ref 3.5–5.1)
Sodium: 138 mmol/L (ref 135–145)
Sodium: 137 mmol/L (ref 135–145)
Total Bilirubin: 0.6 mg/dL (ref 0.3–1.2)
Total Bilirubin: 0.5 mg/dL (ref 0.3–1.2)
Total Protein: 6.1 g/dL — ABNORMAL LOW (ref 6.5–8.1)
Total Protein: 6.4 g/dL — ABNORMAL LOW (ref 6.5–8.1)

## 2019-12-28 MED ORDER — TETRACAINE HCL 0.5 % OP SOLN
OPHTHALMIC | Status: AC
  Filled 2019-12-28: qty 4

## 2019-12-28 MED ORDER — FLUORESCEIN SODIUM 1 MG OP STRP
ORAL_STRIP | OPHTHALMIC | Status: AC
  Filled 2019-12-28: qty 1

## 2019-12-28 NOTE — ED Provider Notes (Signed)
[place]    CSN: [id number] Arrival date & time: 12/28/19  1447      History   Chief Complaint Chief Complaint  Patient presents with  . Facial Swelling  . Leg Swelling    HPI Rachael Burke is a 64 y.o. female.   HPI  this is a 64 year old Rachael Burke who lives half the year in [place] and comes to visit to [place] frequently. She has family in both places. She has just come back from [place] 2 days ago. When she got back from [place] her daughter noticed her to be very sick. She sleeps all the time. She has extensive swelling in her face, hands, and feet. Her abdomen appears distended. Her appetite is poor. She had a fall while in [place] and complains of intermittent back pain. She states she is not having back pain right now. She does have a history of diabetes, anemia of chronic disease, hypertension, renal disease. She has been seen by nephrology and told that she does not yet need dialysis. Mother is interviewed through the daughter. Daughter interprets. They declined need for a Rachael interpreter. Mother states that her breathing feels normal. Appetite is poor. Pain in her back off and on. Overall feels very tired and weak. Needs to sit down to rest frequently. She is having infrequent stooling. Less urination. Darker colored urine. No known heart disease. No chest pain.  Past Medical History:  Diagnosis Date  . Anemia   . Anxiety   . Diabetes mellitus without complication ([place])   . Renal disorder     Patient Active Problem List   Diagnosis Date Noted  . Anemia 04/08/2019  . HLD (hyperlipidemia) 04/08/2019  . AKI (acute kidney injury) ([place]) 04/07/2019  . Hypertension associated with diabetes ([place]) 04/06/2019  . Type 2 diabetes mellitus with complication, without long-term current use of insulin ([place]) 01/04/2018    Past Surgical History:  Procedure Laterality Date  . CESAREAN SECTION      OB History   No obstetric history on file.       Home Medications    Prior to Admission medications   Medication Sig Start Date End Date Taking? Authorizing Provider  allopurinol (ZYLOPRIM) 150 mg TABS tablet Take 300 mg by mouth daily.   Yes [provider]  amLODipine-valsartan (EXFORGE) 10-320 MG tablet Take 1 tablet by mouth daily.   Yes [provider]  ASPIRIN PO Take by mouth.   Yes [provider]  atorvastatin (LIPITOR) 20 MG tablet Take 20 mg by mouth daily.   Yes [provider]  linagliptin (TRADJENTA) 5 MG TABS tablet Take 5 mg by mouth daily.   Yes [provider]  moxifloxacin (AVELOX) 400 MG tablet Take 400 mg by mouth daily at 8 pm.   Yes [provider]  NON FORMULARY Domperidona 10mg    Yes [provider]  NON FORMULARY Miccil 1mg    Yes [provider]  NON FORMULARY Bedoyecta   Yes [provider]  pregabalin (LYRICA) 75 MG capsule Take 75 mg by mouth 2 (two) times daily.   Yes [provider]    Family History Family History  Problem Relation Age of Onset  . Diabetes Mother   . Hypertension Father   . Diabetes Sister   . Diabetes Brother   . Diabetes Brother     Social History Social History   Tobacco Use  . Smoking status: Never Smoker  . Smokeless tobacco: Never Used  Substance Use Topics  . Alcohol use: Never  . Drug use: Never     Allergies   Patient has no known allergies.   Review of Systems Review of Systems see HPI  Physical Exam Triage Vital Signs ED Triage Vitals  Enc Vitals Group     BP 12/28/19 1536 (!) 103/48     Pulse Rate 12/28/19 1536 84     Resp 12/28/19 1536 16     Temp 12/28/19 1536 98 F (36.7 C)     Temp src --      SpO2 12/28/19 1536 94 %     Weight --      Height --      Head Circumference --      Peak Flow --      Pain Score 12/28/19 1538 0     Pain Loc --      Pain Edu? --      Excl. in [place]? --    No data found.  Updated Vital Signs BP (!) 103/48   Pulse  84   Temp 98 F (36.7 C)   Resp 16   SpO2 94%     Physical Exam Constitutional:      General: She is not in acute distress.    Appearance: She is well-developed.     Comments: Overweight. Truncal obesity. Somnolent  HENT:     Head: Normocephalic and atraumatic.     Comments: Face is edematous, lids puffy and swollen    Mouth/Throat:     Mouth: Mucous membranes are moist.  Eyes:     Conjunctiva/sclera: Conjunctivae normal.     Pupils: Pupils are equal, round, and reactive to light.  Cardiovascular:     Rate and Rhythm: Normal rate and regular rhythm.     Heart sounds: Murmur heard.      Comments: Short systolic murmur Pulmonary:     Effort: Pulmonary effort is normal. No respiratory distress.     Comments: Rales in bases Abdominal:     General: There is no distension.     Palpations: Abdomen is soft.     Comments: Rounded abdomen. Soft and nontender  Musculoskeletal:        General: Normal range of motion.     Cervical back: Normal range of motion.     Right lower leg: Edema present.     Left lower leg: Edema present.     Comments: Tense extremity edema  Skin:    General: Skin is warm and dry.  Neurological:     General: No focal deficit present.     Mental Status: She is alert.     Comments: Small steps. Stooped posture. Imbalance      UC Treatments / Results  Labs (all labs ordered are listed, but only abnormal results are displayed) Labs Reviewed  CBC WITH DIFFERENTIAL/PLATELET - Abnormal; Notable for the following components:      Result Value   RBC 2.24 (*)    Hemoglobin 6.4 (*)    HCT 20.2 (*)    All other components within normal limits  COMPREHENSIVE METABOLIC PANEL - Abnormal; Notable for the following components:   Potassium 5.6 (*)    CO2 19 (*)    Glucose, Bld 133 (*)    BUN 83 (*)    Creatinine, Ser 4.85 (*)    Calcium 8.0 (*)    Total Protein 6.1 (*)    Albumin 3.1 (*)    AST 11 (*)    GFR  calc non Af Amer 9 (*)    GFR calc Af Amer 10  (*)    All other components within normal limits    EKG   Radiology No results found.  Procedures Procedures (including critical care time)  Medications Ordered in UC Medications - No data to display  Initial Impression / Assessment and Plan / UC Course  I have reviewed the triage vital signs and the nursing notes.  Pertinent labs & imaging results that were available during my care of the patient were reviewed by me and considered in my medical decision making (see chart for details).     Upon the receipt of the critical value hemoglobin 6.4, patient was transferred directly to the emergency room for higher level of care.  Comprehensive metabolic panel still pending.   Metabolic panel reviewed.  Patient is in acute renal failure.  Is in the emergency room at this time Final Clinical Impressions(s) / UC Diagnoses   Final diagnoses:  Severe anemia  Acute renal failure, unspecified acute renal failure type (HCC)  Other fatigue  Other hypervolemia     Discharge Instructions     Gout to emergency room    ED Prescriptions    None     PDMP not reviewed this encounter.   Raylene Everts, MD 12/28/19 [phone]

## 2019-12-28 NOTE — ED Triage Notes (Signed)
Patient presents to the ED for anemia. Evaluated at Urgent Care and advised to come to ED for a hemoglobin 6.4. Reports weakness, dizziness, lightheadedness. Denies chest pain, SOB. Patient appears uncomfortable in triage, respirs even and unlabored. AAOx4.

## 2019-12-28 NOTE — ED Notes (Signed)
Patient is being discharged from the Urgent Care and sent to the Emergency Department via wheelchair with staff assist . Per Dr Meda Coffee, patient is in need of higher level of care due to abnormal labs. Patient is aware and verbalizes understanding of plan of care.  Vitals:   12/28/19 1536  BP: (!) 103/48  Pulse: 84  Resp: 16  Temp: 98 F (36.7 C)  SpO2: 94%

## 2019-12-28 NOTE — Discharge Instructions (Signed)
Gout to emergency room

## 2019-12-28 NOTE — ED Notes (Signed)
Pt not seen in [facility] or outside. Called x3

## 2019-12-28 NOTE — ED Notes (Signed)
Please Call When Roomed [phone]

## 2019-12-28 NOTE — ED Notes (Signed)
N.A.X1

## 2019-12-28 NOTE — ED Triage Notes (Addendum)
Pt c/o bilateral leg swelling and now face swelling since Sunday, has kidney failure history, is not on dialysis

## 2019-12-29 ENCOUNTER — Inpatient Hospital Stay (HOSPITAL_COMMUNITY): Payer: Self-pay

## 2019-12-29 ENCOUNTER — Encounter (HOSPITAL_COMMUNITY): Payer: Self-pay | Admitting: Emergency Medicine

## 2019-12-29 DIAGNOSIS — N185 Chronic kidney disease, stage 5: Secondary | ICD-10-CM

## 2019-12-29 DIAGNOSIS — N179 Acute kidney failure, unspecified: Secondary | ICD-10-CM

## 2019-12-29 DIAGNOSIS — E118 Type 2 diabetes mellitus with unspecified complications: Secondary | ICD-10-CM

## 2019-12-29 DIAGNOSIS — N178 Other acute kidney failure: Secondary | ICD-10-CM

## 2019-12-29 DIAGNOSIS — D649 Anemia, unspecified: Secondary | ICD-10-CM

## 2019-12-29 DIAGNOSIS — N189 Chronic kidney disease, unspecified: Secondary | ICD-10-CM

## 2019-12-29 DIAGNOSIS — E875 Hyperkalemia: Secondary | ICD-10-CM

## 2019-12-29 LAB — URINALYSIS, ROUTINE W REFLEX MICROSCOPIC
Bilirubin Urine: NEGATIVE
Glucose, UA: NEGATIVE mg/dL
Hgb urine dipstick: NEGATIVE
Ketones, ur: NEGATIVE mg/dL
Nitrite: NEGATIVE
Protein, ur: 100 mg/dL — AB
Specific Gravity, Urine: 1.008 (ref 1.005–1.030)
pH: 5 (ref 5.0–8.0)

## 2019-12-29 LAB — HEMOGLOBIN A1C
Hgb A1c MFr Bld: 5.5 % (ref 4.8–5.6)
Mean Plasma Glucose: 111.15 mg/dL

## 2019-12-29 LAB — RENAL FUNCTION PANEL
Albumin: 3.2 g/dL — ABNORMAL LOW (ref 3.5–5.0)
Anion gap: 11 (ref 5–15)
BUN: 80 mg/dL — ABNORMAL HIGH (ref 8–23)
CO2: 15 mmol/L — ABNORMAL LOW (ref 22–32)
Calcium: 8.2 mg/dL — ABNORMAL LOW (ref 8.9–10.3)
Chloride: 109 mmol/L (ref 98–111)
Creatinine, Ser: 4.5 mg/dL — ABNORMAL HIGH (ref 0.44–1.00)
GFR calc Af Amer: 11 mL/min — ABNORMAL LOW (ref >60)
GFR calc non Af Amer: 10 mL/min — ABNORMAL LOW (ref >60)
Glucose, Bld: 111 mg/dL — ABNORMAL HIGH (ref 70–99)
Phosphorus: 6.3 mg/dL — ABNORMAL HIGH (ref 2.5–4.6)
Potassium: 5.3 mmol/L — ABNORMAL HIGH (ref 3.5–5.1)
Sodium: 135 mmol/L (ref 135–145)

## 2019-12-29 LAB — CBG MONITORING, ED
Glucose-Capillary: 103 mg/dL — ABNORMAL HIGH (ref 70–99)
Glucose-Capillary: 138 mg/dL — ABNORMAL HIGH (ref 70–99)

## 2019-12-29 LAB — GLUCOSE, CAPILLARY
Glucose-Capillary: 152 mg/dL — ABNORMAL HIGH (ref 70–99)
Glucose-Capillary: 112 mg/dL — ABNORMAL HIGH (ref 70–99)

## 2019-12-29 LAB — POC OCCULT BLOOD, ED: Fecal Occult Bld: NEGATIVE

## 2019-12-29 LAB — CBC
HCT: 25.4 % — ABNORMAL LOW (ref 36.0–46.0)
Hemoglobin: 8 g/dL — ABNORMAL LOW (ref 12.0–15.0)
MCH: 28.7 pg (ref 26.0–34.0)
MCHC: 31.5 g/dL (ref 30.0–36.0)
MCV: 91 fL (ref 80.0–100.0)
Platelets: 201 10*3/uL (ref 150–400)
RBC: 2.79 MIL/uL — ABNORMAL LOW (ref 3.87–5.11)
RDW: 13.8 % (ref 11.5–15.5)
WBC: 7.9 10*3/uL (ref 4.0–10.5)
nRBC: 0 % (ref 0.0–0.2)

## 2019-12-29 LAB — CREATININE, SERUM
Creatinine, Ser: 4.54 mg/dL — ABNORMAL HIGH (ref 0.44–1.00)
GFR calc Af Amer: 11 mL/min — ABNORMAL LOW (ref >60)
GFR calc non Af Amer: 10 mL/min — ABNORMAL LOW (ref >60)

## 2019-12-29 LAB — CREATININE, URINE, RANDOM: Creatinine, Urine: 53.03 mg/dL

## 2019-12-29 LAB — PREPARE RBC (CROSSMATCH)

## 2019-12-29 LAB — SARS CORONAVIRUS 2 BY RT PCR (HOSPITAL ORDER, PERFORMED IN ~~LOC~~ HOSPITAL LAB): SARS Coronavirus 2: NEGATIVE

## 2019-12-29 LAB — SODIUM, URINE, RANDOM: Sodium, Ur: 66 mmol/L

## 2019-12-29 MED ORDER — FUROSEMIDE 10 MG/ML IJ SOLN
120.0000 mg | Freq: Two times a day (BID) | INTRAVENOUS | Status: DC
  Administered 2019-12-29 – 2019-12-30 (×4): 120 mg via INTRAVENOUS
  Filled 2019-12-29 (×3): qty 12
  Filled 2019-12-29: qty 2
  Filled 2019-12-29 (×2): qty 12
  Filled 2019-12-29: qty 2

## 2019-12-29 MED ORDER — ONDANSETRON HCL 4 MG/2ML IJ SOLN: 4.0000 mg | Freq: Four times a day (QID) | INTRAMUSCULAR | Status: DC | PRN

## 2019-12-29 MED ORDER — SODIUM CHLORIDE 0.9 % IV SOLN: 10.0000 mL/h | Freq: Once | INTRAVENOUS | Status: DC

## 2019-12-29 MED ORDER — ONDANSETRON HCL 4 MG PO TABS: 4.0000 mg | ORAL_TABLET | Freq: Four times a day (QID) | ORAL | Status: DC | PRN

## 2019-12-29 MED ORDER — FUROSEMIDE 10 MG/ML IJ SOLN
40.0000 mg | Freq: Once | INTRAMUSCULAR | Status: AC
  Administered 2019-12-29: 40 mg via INTRAVENOUS
  Filled 2019-12-29: qty 4

## 2019-12-29 MED ORDER — FUROSEMIDE 10 MG/ML IJ SOLN: 160.0000 mg | Freq: Two times a day (BID) | INTRAVENOUS | Status: DC

## 2019-12-29 MED ORDER — HEPARIN SODIUM (PORCINE) 5000 UNIT/ML IJ SOLN
5000.0000 [IU] | Freq: Three times a day (TID) | INTRAMUSCULAR | Status: DC
  Administered 2019-12-29 – 2019-12-31 (×8): 5000 [IU] via SUBCUTANEOUS
  Filled 2019-12-29 (×8): qty 1

## 2019-12-29 MED ORDER — HYDRALAZINE HCL 20 MG/ML IJ SOLN: 10.0000 mg | INTRAMUSCULAR | Status: DC | PRN

## 2019-12-29 MED ORDER — INSULIN ASPART 100 UNIT/ML ~~LOC~~ SOLN
0.0000 [IU] | Freq: Three times a day (TID) | SUBCUTANEOUS | Status: DC
  Administered 2019-12-29 – 2019-12-30 (×2): 1 [IU] via SUBCUTANEOUS

## 2019-12-29 MED ORDER — FUROSEMIDE 10 MG/ML IJ SOLN: 40.0000 mg | Freq: Two times a day (BID) | INTRAMUSCULAR | Status: DC

## 2019-12-29 NOTE — H&P (Signed)
History and Physical    Rachael Burke OFB:[id number] DOB: 1956/03/28 DOA: 12/28/2019  PCP: Horald Pollen, MD  Patient coming from: Home.  Translation provided by patient's daughter.  Chief Complaint: Low hemoglobin.  HPI: Rachael Burke is a 64 y.o. female with history of chronic kidney disease stage III admitted last year for anemia and worsening renal function patient also follows up at [place] off and on has been in [place] for the last 5 months has been noticing increasing lower extremity edema last 1 week.  Has been having increasing exertional dyspnea and had gone to the urgent care where patient had blood work drawn and showed worsening renal function and anemia.  Patient was referred to the ER.  Patient states she has not been taking any NSAIDs.  Denies noticing any blood in the stools or black stools.  ED Course: In the ER on exam patient has bilateral lower extremity edema 2+ pitting on both lower extremities with creatinine around 4.75 bicarb of 17 the last creatinine in our system in December 2020 was 1.74.  Patient's hemoglobin is 6.4 the last one in our system in December 2020 was 9.2.  Stool for occult blood was negative.  Chest x-ray shows mild cardiac enlargement.  Covid test is pending.  Patient was ordered 2 units of PRBC transfusion and Lasix 40 mg IV.  Admitted for worsening renal function with worsening anemia likely related to renal disease.  Review of Systems: As per HPI, rest all negative.   Past Medical History:  Diagnosis Date  . Anemia   . Anxiety   . Diabetes mellitus without complication ([place])   . Renal disorder     Past Surgical History:  Procedure Laterality Date  . CESAREAN SECTION       reports that she has never smoked. She has never used smokeless tobacco. She reports that she does not drink alcohol and does not use drugs.  No Known Allergies  Family History  Problem Relation Age of Onset  . Diabetes Mother     . Hypertension Father   . Diabetes Sister   . Diabetes Brother   . Diabetes Brother     Prior to Admission medications   Medication Sig Start Date End Date Taking? Authorizing Provider  allopurinol (ZYLOPRIM) 150 mg TABS tablet Take 300 mg by mouth daily.    [provider]  amLODipine-valsartan (EXFORGE) 10-320 MG tablet Take 1 tablet by mouth daily.    [provider]  ASPIRIN PO Take by mouth.    [provider]  atorvastatin (LIPITOR) 20 MG tablet Take 20 mg by mouth daily.    [provider]  linagliptin (TRADJENTA) 5 MG TABS tablet Take 5 mg by mouth daily.    [provider]  moxifloxacin (AVELOX) 400 MG tablet Take 400 mg by mouth daily at 8 pm.    [provider]  NON FORMULARY Domperidona 10mg     [provider]  NON FORMULARY Miccil 1mg     [provider]  NON FORMULARY Bedoyecta    [provider]  pregabalin (LYRICA) 75 MG capsule Take 75 mg by mouth 2 (two) times daily.    [provider]    Physical Exam: Constitutional: Moderately built and nourished. Vitals:   12/29/19 0428 12/29/19 0430 12/29/19 0445 12/29/19 0515  BP: (!) 108/44 (!) 108/44 (!) 101/54 (!) 115/54  Pulse: 80 82 77 76  Resp: 18 22 15 16   Temp: 97.7 F (36.5  C)     TempSrc: Oral     SpO2: 99% 99% 96% 98%   Eyes: Anicteric no pallor. ENMT: No discharge from the ears eyes nose or mouth. Neck: No mass felt.  No neck rigidity. Respiratory: No rhonchi or crepitations. Cardiovascular: S1-S2 heard. Abdomen: Soft nontender bowel sounds present. Musculoskeletal: Bilateral lower extremity edema present. Skin: Appears pale. Neurologic: Alert awake oriented to time place and person.  Moves all extremities. Psychiatric: Appears normal.  Normal affect.   Labs on Admission: I have personally reviewed following labs and imaging studies  CBC: Recent Labs  Lab 12/28/19 1609 12/28/19 1830  WBC 7.8 8.1  NEUTROABS  5.6  --   HGB 6.4* 6.4*  HCT 20.2* 20.7*  MCV 90.2 92.4  PLT 224 024   Basic Metabolic Panel: Recent Labs  Lab 12/28/19 1609 12/28/19 1830  NA 138 137  K 5.6* 5.7*  CL 111 111  CO2 19* 17*  GLUCOSE 133* 119*  BUN 83* 83*  CREATININE 4.85* 4.75*  CALCIUM 8.0* 8.0*   GFR: CrCl cannot be calculated (Unknown ideal weight.). Liver Function Tests: Recent Labs  Lab 12/28/19 1609 12/28/19 1830  AST 11* 13*  ALT 15 14  ALKPHOS 77 79  BILITOT 0.6 0.5  PROT 6.1* 6.4*  ALBUMIN 3.1* 3.1*   No results for input(s): LIPASE, AMYLASE in the last 168 hours. No results for input(s): AMMONIA in the last 168 hours. Coagulation Profile: No results for input(s): INR, PROTIME in the last 168 hours. Cardiac Enzymes: No results for input(s): CKTOTAL, CKMB, CKMBINDEX, TROPONINI in the last 168 hours. BNP (last 3 results) No results for input(s): PROBNP in the last 8760 hours. HbA1C: No results for input(s): HGBA1C in the last 72 hours. CBG: No results for input(s): GLUCAP in the last 168 hours. Lipid Profile: No results for input(s): CHOL, HDL, LDLCALC, TRIG, CHOLHDL, LDLDIRECT in the last 72 hours. Thyroid Function Tests: No results for input(s): TSH, T4TOTAL, FREET4, T3FREE, THYROIDAB in the last 72 hours. Anemia Panel: No results for input(s): VITAMINB12, FOLATE, FERRITIN, TIBC, IRON, RETICCTPCT in the last 72 hours. Urine analysis:    Component Value Date/Time   COLORURINE YELLOW 04/08/2019 0111   APPEARANCEUR HAZY (A) 04/08/2019 0111   LABSPEC 1.009 04/08/2019 0111   PHURINE 7.0 04/08/2019 0111   GLUCOSEU NEGATIVE 04/08/2019 0111   HGBUR NEGATIVE 04/08/2019 0111   BILIRUBINUR NEGATIVE 04/08/2019 0111   KETONESUR NEGATIVE 04/08/2019 0111   PROTEINUR 100 (A) 04/08/2019 0111   NITRITE POSITIVE (A) 04/08/2019 0111   LEUKOCYTESUR MODERATE (A) 04/08/2019 0111   Sepsis Labs: @LABRCNTIP (procalcitonin:4,lacticidven:4) )No results found for this or any previous visit (from the  past 240 hour(s)).   Radiological Exams on Admission: DG Chest Portable 1 View  Result Date: 12/29/2019 CLINICAL DATA:  Lower extremity and facial edema EXAM: PORTABLE CHEST 1 VIEW COMPARISON:  None. FINDINGS: Lungs are clear. Heart is mildly enlarged with pulmonary vascularity normal. No adenopathy. There is aortic atherosclerosis. No bone lesions. IMPRESSION: Mild cardiac enlargement.  Lungs clear. Aortic Atherosclerosis (ICD10-I70.0). Electronically Signed   By: Lowella Grip III M.D.   On: 12/29/2019 04:50      Assessment/Plan Principal Problem:   Acute renal failure superimposed on chronic kidney disease (HCC) Active Problems:   Type 2 diabetes mellitus with complication, without long-term current use of insulin (HCC)   Symptomatic anemia   HLD (hyperlipidemia)   Anemia associated with chronic renal failure    1. Acute on chronic kidney disease likely stage V  now -patient likely has progressive renal disease at this time age with peripheral edema.  Urinary studies are pending.  Patient is getting blood transfusion and Lasix.  We will continue with Lasix check renal sonogram consult nephrology.  Follow intake output and daily weights. 2. Anemia likely related to renal disease.  Patient is getting 2 units of PRBC transfusion.  Follow metabolic panel after transfusion. 3. Diabetes mellitus type 2 we will keep patient on sliding scale coverage. 4. Hypertension we will keep patient on as needed IV hydralazine for now until we confirm patient's home medications. 5. History of hyperlipidemia we need to confirm home medications. 6. History of gout.  Home medications has not been verified yet. Covid test is pending. Since patient has worsening renal function with worsening anemia will need close monitoring for any further deterioration in inpatient status.   DVT prophylaxis: Heparin. Code Status: Full code. Family Communication: Patient's daughter. Disposition Plan: Home. Consults  called: None. Admission status: Inpatient.   Rise Patience MD [facility] Pager [phone].  If 7PM-7AM, please contact night-coverage [email or web address] Password [facility]  12/29/2019, 6:44 AM

## 2019-12-29 NOTE — ED Notes (Signed)
Pt transported to ultrasound.

## 2019-12-29 NOTE — ED Provider Notes (Addendum)
[facility] EMERGENCY DEPARTMENT Provider Note   CSN: [id number] Arrival date & time: 12/28/19  1723     History Chief Complaint  Patient presents with  . Anemia    Rachael Burke is a 64 y.o. female.  The history is provided by the patient and a relative. The history is limited by a language barrier.  Anemia This is a new problem. Episode onset: unknown. The problem occurs constantly. Pertinent negatives include no chest pain, no abdominal pain, no headaches and no shortness of breath. Nothing aggravates the symptoms. Nothing relieves the symptoms. She has tried nothing for the symptoms. The treatment provided no relief.  Illness Location:  While in [place] Quality:  Anasarca, eyelids and legs  Severity:  Severe Onset quality:  Gradual Duration:  5 days Timing:  Constant Progression:  Worsening Chronicity:  Recurrent Context:  Has been unwell x 6 months  Relieved by:  Nothing Worsened by:  Time  Ineffective treatments:  None tried Associated symptoms: fatigue   Associated symptoms: no abdominal pain, no chest pain, no cough, no diarrhea, no fever, no headaches, no loss of consciousness, no nausea, no rash, no shortness of breath, no sore throat and no wheezing   Associated symptoms comment:  Change in appetite  Patient with DM and chronic kidney disease presents following recently returned returned from [place] and was found to have eyelid and leg swelling. Hasn't been feeling well and has had poor appetite and was taken to urgent care, found to be anemic and in renal failure.       Past Medical History:  Diagnosis Date  . Anemia   . Anxiety   . Diabetes mellitus without complication ([place])   . Renal disorder     Patient Active Problem List   Diagnosis Date Noted  . Anemia 04/08/2019  . HLD (hyperlipidemia) 04/08/2019  . AKI (acute kidney injury) ([place]) 04/07/2019  . Hypertension associated with diabetes ([place]) 04/06/2019  . Type 2 diabetes  mellitus with complication, without long-term current use of insulin ([place]) 01/04/2018    Past Surgical History:  Procedure Laterality Date  . CESAREAN SECTION       OB History   No obstetric history on file.     Family History  Problem Relation Age of Onset  . Diabetes Mother   . Hypertension Father   . Diabetes Sister   . Diabetes Brother   . Diabetes Brother     Social History   Tobacco Use  . Smoking status: Never Smoker  . Smokeless tobacco: Never Used  Substance Use Topics  . Alcohol use: Never  . Drug use: Never    Home Medications Prior to Admission medications   Medication Sig Start Date End Date Taking? Authorizing Provider  allopurinol (ZYLOPRIM) 150 mg TABS tablet Take 300 mg by mouth daily.    [provider]  amLODipine-valsartan (EXFORGE) 10-320 MG tablet Take 1 tablet by mouth daily.    [provider]  ASPIRIN PO Take by mouth.    [provider]  atorvastatin (LIPITOR) 20 MG tablet Take 20 mg by mouth daily.    [provider]  linagliptin (TRADJENTA) 5 MG TABS tablet Take 5 mg by mouth daily.    [provider]  moxifloxacin (AVELOX) 400 MG tablet Take 400 mg by mouth daily at 8 pm.    [provider]  NON FORMULARY Domperidona 10mg     [provider]  NON FORMULARY Miccil 1mg   [provider]  [facility]    [provider]  pregabalin (LYRICA) 75 MG capsule Take 75 mg by mouth 2 (two) times daily.    [provider]    Allergies    Patient has no known allergies.  Review of Systems   Review of Systems  Constitutional: Positive for appetite change and fatigue. Negative for fever.  HENT: Negative for sore throat.   Eyes: Negative for visual disturbance.  Respiratory: Negative for cough, shortness of breath and wheezing.   Cardiovascular: Positive for leg swelling. Negative for chest pain.  Gastrointestinal: Negative for abdominal pain,  diarrhea and nausea.  Genitourinary: Negative for difficulty urinating.  Musculoskeletal: Negative for arthralgias.  Skin: Negative for rash.  Neurological: Negative for loss of consciousness and headaches.  Psychiatric/Behavioral: Negative for agitation.  All other systems reviewed and are negative.   Physical Exam Updated Vital Signs BP (!) 109/56 (BP Location: Right Arm)   Pulse 77   Temp 98 F (36.7 C) (Oral)   Resp 15   SpO2 97%   Physical Exam  ED Results / Procedures / Treatments   Labs (all labs ordered are listed, but only abnormal results are displayed) Results for orders placed or performed during the hospital encounter of 12/28/19  Comprehensive metabolic panel  Result Value Ref Range   Sodium 137 135 - 145 mmol/L   Potassium 5.7 (H) 3.5 - 5.1 mmol/L   Chloride 111 98 - 111 mmol/L   CO2 17 (L) 22 - 32 mmol/L   Glucose, Bld 119 (H) 70 - 99 mg/dL   BUN 83 (H) 8 - 23 mg/dL   Creatinine, Ser 4.75 (H) 0.44 - 1.00 mg/dL   Calcium 8.0 (L) 8.9 - 10.3 mg/dL   Total Protein 6.4 (L) 6.5 - 8.1 g/dL   Albumin 3.1 (L) 3.5 - 5.0 g/dL   AST 13 (L) 15 - 41 U/L   ALT 14 0 - 44 U/L   Alkaline Phosphatase 79 38 - 126 U/L   Total Bilirubin 0.5 0.3 - 1.2 mg/dL   GFR calc non Af Amer 9 (L) >60 mL/min   GFR calc Af Amer 10 (L) >60 mL/min   Anion gap 9 5 - 15  CBC  Result Value Ref Range   WBC 8.1 4.0 - 10.5 K/uL   RBC 2.24 (L) 3.87 - 5.11 MIL/uL   Hemoglobin 6.4 (LL) 12.0 - 15.0 g/dL   HCT 20.7 (L) 36 - 46 %   MCV 92.4 80.0 - 100.0 fL   MCH 28.6 26.0 - 34.0 pg   MCHC 30.9 30.0 - 36.0 g/dL   RDW 13.9 11.5 - 15.5 %   Platelets 224 150 - 400 K/uL   nRBC 0.0 0.0 - 0.2 %  Type and screen [place]  Result Value Ref Range   ABO/RH(D) A POS    Antibody Screen NEG    Sample Expiration      12/31/2019,2359 Performed at [facility], [address] [place], [place] [address]    Unit Number [id number]    Blood Component Type RED CELLS,LR    Unit  division 00    Status of Unit ALLOCATED    Transfusion Status OK TO TRANSFUSE    Crossmatch Result Compatible    Unit Number [id number]    Blood Component Type RED CELLS,LR    Unit division 00    Status of Unit ALLOCATED    Transfusion Status OK TO TRANSFUSE  Crossmatch Result Compatible   Prepare RBC (crossmatch)  Result Value Ref Range   Order Confirmation      ORDER PROCESSED BY BLOOD BANK Performed at [facility], [place] [address], [place], [place] [address]   BPAM [facility]  Result Value Ref Range   Blood Product Unit Number [id number]    PRODUCT CODE [id number]    Unit Type and Rh 3300    Blood Product Expiration Date [id number]    Blood Product Unit Number [id number]    PRODUCT CODE [id number]    Unit Type and Rh 2035    Blood Product Expiration Date [id number]    No results found.  EKG None  Radiology No results found.  Procedures Procedures (including critical care time)  Medications Ordered in ED Medications  0.9 %  sodium chloride infusion (has no administration in time range)  furosemide (LASIX) injection 40 mg (has no administration in time range)    ED Course  I have reviewed the triage vital signs and the nursing notes.  Pertinent labs & imaging results that were available during my care of the patient were reviewed by me and considered in my medical decision making (see chart for details).  MDM Reviewed: previous chart, nursing note and vitals Reviewed previous: labs Interpretation: labs and x-ray (anemia and acute renal failure. ) Total time providing critical care: blood to transfuse by me  Consults: admitting MD  CRITICAL CARE Performed by: Caidan Hubbert K Deunte Bledsoe-Rasch Total critical care time: 60 minutes Critical care time was exclusive of separately billable procedures and treating other patients. Critical care was necessary to treat or prevent imminent or life-threatening deterioration. Critical care was time spent personally by me  on the following activities: development of treatment plan with patient and/or surrogate as well as nursing, discussions with consultants, evaluation of patient's response to treatment, examination of patient, obtaining history from patient or surrogate, ordering and performing treatments and interventions, ordering and review of laboratory studies, ordering and review of radiographic studies, pulse oximetry and re-evaluation of patient's condition.   503 Case d/w Dr. Hal Hope, given whole body fluid overload will start lasix based on discussion.    Final Clinical Impression(s) / ED Diagnoses Final diagnoses:  Symptomatic anemia  Anasarca associated with disorder of kidney  Acute renal failure superimposed on chronic kidney disease, unspecified CKD stage, unspecified acute renal failure type [facility])   Admit to medicine    Kerim Statzer, MD 12/29/19 [place], Glenetta Kiger, MD 12/29/19 4680

## 2019-12-29 NOTE — ED Notes (Signed)
Breakfast tray ordered 

## 2019-12-29 NOTE — Progress Notes (Signed)
[facility] PROGRESS NOTE    Progress Note  Rachael Burke  OMA:[id number] DOB: Nov 29, 1955 DOA: 12/28/2019 PCP: Horald Pollen, MD     Brief Narrative:   Rachael Burke is an 64 y.o. female past medical history of chronic kidney disease stage III admitted last year for anemia and worsening renal function, she migrated to [place] about 25-month[place] to admission and noticed lower extremity edema.  Went to urgent care where she was found to be dyspneic and with worsening renal function and anemia.  Assessment/Plan:  Acute renal failure superimposed on chronic kidney disease ([place] stage IV-V: With a creatinine of 1.5-1.7 on 05/04/2019, on admission of 4.5. She was started on IV Lasix and renal ultrasound has been ordered. Nephrology has been consulted. Continue strict I's and O's and daily weights. The most likely cause of her renal dysfunction is uncontrolled diabetes mellitus she has no medical insurance.  In the setting of ARB use. Renal ultrasound back in November 2020 showed normal renal parenchyma.  Anemia likely related to renal disease: 6 on admission she is being transfused 2 units of packed red blood cells to get CBC posttransfusion on.  Hyperkalemia: She was started on IV Lasix, repeat b-met after IV lasix. If no improvement into her potassium will give Lokelma.  Type 2 diabetes mellitus with complication, without long-term current use of insulin (HCC) With a last A1c of 6.7, discontinue oral hypoglycemic agents, continue sliding scale insulin.   DVT prophylaxis: lovenox Family Communication:Daughter Status is: Inpatient  Remains inpatient appropriate because:Hemodynamically unstable   Dispo: The patient is from: Home              Anticipated d/c is to: Home              Anticipated d/c date is: > 3 days              Patient currently is not medically stable to d/c.        Code Status:     Code Status Orders  (From  admission, onward)         Start     Ordered   12/29/19 0640  Full code  Continuous        12/29/19 0641        Code Status History    Date Active Date Inactive Code Status Order ID Comments User Context   04/08/2019 0045 04/10/2019 1617 Full Code [id number] RShela Leff MD ED   Advance Care Planning Activity        IV Access:    Peripheral IV   Procedures and diagnostic studies:   DG Chest Portable 1 View  Result Date: 12/29/2019 CLINICAL DATA:  Lower extremity and facial edema EXAM: PORTABLE CHEST 1 VIEW COMPARISON:  None. FINDINGS: Lungs are clear. Heart is mildly enlarged with pulmonary vascularity normal. No adenopathy. There is aortic atherosclerosis. No bone lesions. IMPRESSION: Mild cardiac enlargement.  Lungs clear. Aortic Atherosclerosis (ICD10-I70.0). Electronically Signed   By: WLowella GripIII M.D.   On: 12/29/2019 04:50     Medical Consultants:    None.  Anti-Infectives:   none  Subjective:    [name] AGuinevere Scarletrelates she continues to be short of breath and orthopneic  Objective:    Vitals:   12/29/19 0600 12/29/19 0615 12/29/19 0630 12/29/19 0645  BP: (!) 126/59 (!) 118/58 (!) 109/56 (!) 115/59  Pulse: 88 86 82 79  Resp: _0 (!) 11  Temp:      TempSrc:      SpO2: 100% 100% 99% 100%   SpO2: 100 %  No intake or output data in the 24 hours ending 12/29/19 0700 There were no vitals filed for this visit.  Exam: General exam: In no acute distress. Respiratory system: Good air movement and clear to auscultation. Cardiovascular system: S1 & S2 heard, RRR.  Positive JVD Gastrointestinal system: Abdomen is nondistended, soft and nontender.  Extremities: 3+ edema Skin: No rashes, lesions or ulcers Psychiatry: Judgement and insight appear normal. Mood & affect appropriate.    Data Reviewed:    Labs: Basic Metabolic Panel: Recent Labs  Lab 12/28/19 1609 12/28/19 1830  NA 138 137  K 5.6* 5.7*  CL 111 111    CO2 19* 17*  GLUCOSE 133* 119*  BUN 83* 83*  CREATININE 4.85* 4.75*  CALCIUM 8.0* 8.0*   GFR CrCl cannot be calculated (Unknown ideal weight.). Liver Function Tests: Recent Labs  Lab 12/28/19 1609 12/28/19 1830  AST 11* 13*  ALT 15 14  ALKPHOS 77 79  BILITOT 0.6 0.5  PROT 6.1* 6.4*  ALBUMIN 3.1* 3.1*   No results for input(s): LIPASE, AMYLASE in the last 168 hours. No results for input(s): AMMONIA in the last 168 hours. Coagulation profile No results for input(s): INR, PROTIME in the last 168 hours. COVID-19 Labs  No results for input(s): DDIMER, FERRITIN, LDH, CRP in the last 72 hours.  Lab Results  Component Value Date   [place] NEGATIVE 04/07/2019    CBC: Recent Labs  Lab 12/28/19 1609 12/28/19 1830  WBC 7.8 8.1  NEUTROABS 5.6  --   HGB 6.4* 6.4*  HCT 20.2* 20.7*  MCV 90.2 92.4  PLT 224 224   Cardiac Enzymes: No results for input(s): CKTOTAL, CKMB, CKMBINDEX, TROPONINI in the last 168 hours. BNP (last 3 results) No results for input(s): PROBNP in the last 8760 hours. CBG: No results for input(s): GLUCAP in the last 168 hours. D-Dimer: No results for input(s): DDIMER in the last 72 hours. Hgb A1c: No results for input(s): HGBA1C in the last 72 hours. Lipid Profile: No results for input(s): CHOL, HDL, LDLCALC, TRIG, CHOLHDL, LDLDIRECT in the last 72 hours. Thyroid function studies: No results for input(s): TSH, T4TOTAL, T3FREE, THYROIDAB in the last 72 hours.  Invalid input(s): FREET3 Anemia work up: No results for input(s): VITAMINB12, FOLATE, FERRITIN, TIBC, IRON, RETICCTPCT in the last 72 hours. Sepsis Labs: Recent Labs  Lab 12/28/19 1609 12/28/19 1830  WBC 7.8 8.1   Microbiology No results found for this or any previous visit (from the past 240 hour(s)).   Medications:   . furosemide  40 mg Intravenous Q12H  . heparin  5,000 Units Subcutaneous Q8H  . insulin aspart  0-6 Units Subcutaneous TID WC   Continuous Infusions: .  sodium chloride        LOS: 0 days   Charlynne Cousins  [facility]  12/29/2019, 7:00 AM

## 2019-12-29 NOTE — ED Notes (Signed)
Lunch Tray Ordered @ 1019.

## 2019-12-30 ENCOUNTER — Inpatient Hospital Stay (HOSPITAL_COMMUNITY): Payer: Self-pay

## 2019-12-30 DIAGNOSIS — R609 Edema, unspecified: Secondary | ICD-10-CM

## 2019-12-30 LAB — CBC
HCT: 29 % — ABNORMAL LOW (ref 36.0–46.0)
Hemoglobin: 9.7 g/dL — ABNORMAL LOW (ref 12.0–15.0)
MCH: 29.1 pg (ref 26.0–34.0)
MCHC: 33.4 g/dL (ref 30.0–36.0)
MCV: 87.1 fL (ref 80.0–100.0)
Platelets: 218 10*3/uL (ref 150–400)
RBC: 3.33 MIL/uL — ABNORMAL LOW (ref 3.87–5.11)
RDW: 14.2 % (ref 11.5–15.5)
WBC: 7.7 10*3/uL (ref 4.0–10.5)
nRBC: 0 % (ref 0.0–0.2)

## 2019-12-30 LAB — TYPE AND SCREEN
ABO/RH(D): A POS
Antibody Screen: NEGATIVE
Unit division: 0
Unit division: 0

## 2019-12-30 LAB — GLUCOSE, CAPILLARY
Glucose-Capillary: 88 mg/dL (ref 70–99)
Glucose-Capillary: 130 mg/dL — ABNORMAL HIGH (ref 70–99)
Glucose-Capillary: 156 mg/dL — ABNORMAL HIGH (ref 70–99)

## 2019-12-30 LAB — BASIC METABOLIC PANEL
Anion gap: 10 (ref 5–15)
BUN: 76 mg/dL — ABNORMAL HIGH (ref 8–23)
CO2: 19 mmol/L — ABNORMAL LOW (ref 22–32)
Calcium: 8.4 mg/dL — ABNORMAL LOW (ref 8.9–10.3)
Chloride: 111 mmol/L (ref 98–111)
Creatinine, Ser: 4.54 mg/dL — ABNORMAL HIGH (ref 0.44–1.00)
GFR calc Af Amer: 11 mL/min — ABNORMAL LOW (ref >60)
GFR calc non Af Amer: 10 mL/min — ABNORMAL LOW (ref >60)
Glucose, Bld: 97 mg/dL (ref 70–99)
Potassium: 5.2 mmol/L — ABNORMAL HIGH (ref 3.5–5.1)
Sodium: 140 mmol/L (ref 135–145)

## 2019-12-30 LAB — BPAM RBC
Blood Product Expiration Date: 202108232359
Blood Product Expiration Date: 202108232359
ISSUE DATE / TIME: 202107300406
ISSUE DATE / TIME: 202107301214
Unit Type and Rh: 6200
Unit Type and Rh: 6200

## 2019-12-30 NOTE — Progress Notes (Signed)
Lower extremity venous has been completed.   Preliminary results in CV Proc.   Abram Sander 12/30/2019 9:54 AM

## 2019-12-30 NOTE — Progress Notes (Addendum)
[facility] PROGRESS NOTE    Progress Note  Rachael Burke  JQB:[id number] DOB: Oct 02, 1955 DOA: 12/28/2019 PCP: Horald Pollen, MD     Brief Narrative:   Rachael Burke is an 64 y.o. female past medical history of chronic kidney disease stage III admitted last year for anemia and worsening renal function, she migrated to [place] about [place]-monthprior to admission and noticed lower extremity edema.  Went to urgent care where she was found to be dyspneic and with worsening renal function and anemia.  Assessment/Plan:  Acute renal failure superimposed on chronic kidney disease ([place] stage IV-V: With a creatinine of 1.5-1.7 on 05/04/2019, on admission of 4.75. Renal ultrasound showed no acute findings, medical disease. Still appears fluid overloaded on physical exam. Continue IV Lasix. Continue strict I's and O's and daily weights, the most likely cause of her renal dysfunction is uncontrolled diabetes mellitus in the setting of no insurance and ARB use.  Anemia likely related to renal disease: 6 on admission she is being transfused 2 units of packed red blood cells to get CBC posttransfusion on.  Hyperkalemia: She was started on IV Lasix, repeat b-met after IV lasix. If no improvement into her potassium will give Lokelma.  Type 2 diabetes mellitus with complication, without long-term current use of insulin ([place] With a hemoglobin A1c of 5.5, blood glucose has remained well controlled in house. Protein in urine.  DVT prophylaxis: lovenox Family Communication:Daughter Status is: Inpatient  Remains inpatient appropriate because:Hemodynamically unstable   Dispo: The patient is from: Home              Anticipated d/c is to: Home              Anticipated d/c date is: 2 days              Patient currently is not medically stable to d/c.     Code Status:     Code Status Orders  (From admission, onward)         Start     Ordered    12/29/19 0640  Full code  Continuous        12/29/19 0641        Code Status History    Date Active Date Inactive Code Status Order ID Comments User Context   04/08/2019 0045 04/10/2019 1617 Full Code [id number] RShela Leff MD ED   Advance Care Planning Activity        IV Access:    Peripheral IV   Procedures and diagnostic studies:   [place]  Result Date: 12/29/2019 CLINICAL DATA:  Acute renal failure EXAM: RENAL / URINARY TRACT ULTRASOUND COMPLETE COMPARISON:  None. FINDINGS: Right Kidney: Renal measurements: 11.5 x 4.8 x 5.1 cm = volume: 147 mL. Slightly increased echotexture. No mass or hydronephrosis. Left Kidney: Renal measurements: 11.1 x 5.3 x 6.0 cm = volume: 185 mL. Slightly increased echotexture. No mass or hydronephrosis. Bladder: Appears normal for degree of bladder distention. Other: None. IMPRESSION: No acute findings.  No hydronephrosis. Electronically Signed   By: KRolm BaptiseM.D.   On: 12/29/2019 11:30   DG Chest Portable 1 View  Result Date: 12/29/2019 CLINICAL DATA:  Lower extremity and facial edema EXAM: PORTABLE CHEST 1 VIEW COMPARISON:  None. FINDINGS: Lungs are clear. Heart is mildly enlarged with pulmonary vascularity normal. No adenopathy. There is aortic atherosclerosis. No bone lesions. IMPRESSION: Mild cardiac enlargement.  Lungs clear. Aortic Atherosclerosis (ICD10-I70.0). Electronically Signed  By: Lowella Grip III M.D.   On: 12/29/2019 04:50     Medical Consultants:    None.  Anti-Infectives:   none  Subjective:    [name] orthopnea resolved she relates her breathing is much improved.  Objective:    Vitals:   12/29/19 1535 12/29/19 1543 12/29/19 2016 12/30/19 0500  BP: (!) 122/64 (!) 122/64 (!) 134/61   Pulse: 82 82 86   Resp: 16 16 (!) 10   Temp: 98.5 F (36.9 C)  99.3 F (37.4 C)   TempSrc: Oral     SpO2: 99%  99%   Weight:    71.6 kg   SpO2: 99 %   Intake/Output Summary (Last 24 hours) at  12/30/2019 0722 Last data filed at 12/30/2019 0448 Gross per 24 hour  Intake 1111.64 ml  Output 5350 ml  Net -4238.36 ml   Filed Weights   12/30/19 0500  Weight: 71.6 kg    Exam: General exam: In no acute distress. Respiratory system: Good air movement and clear to auscultation. Cardiovascular system: S1 & S2 heard, RRR. + JVD Gastrointestinal system: Abdomen is nondistended, soft and nontender.   Extremities: No pedal edema. Skin: No rashes, lesions or ulcers Psychiatry: Judgement and insight appear normal. Mood & affect appropriate.   Data Reviewed:    Labs: Basic Metabolic Panel: Recent Labs  Lab 12/28/19 1609 12/28/19 1609 12/28/19 1830 12/28/19 1830 12/29/19 0933 12/30/19 0132  NA 138  --  137  --  135 140  K 5.6*   < > 5.7*   < > 5.3* 5.2*  CL 111  --  111  --  109 111  CO2 19*  --  17*  --  15* 19*  GLUCOSE 133*  --  119*  --  111* 97  BUN 83*  --  83*  --  80* 76*  CREATININE 4.85*  --  4.75*  --  4.50*  4.54* 4.54*  CALCIUM 8.0*  --  8.0*  --  8.2* 8.4*  PHOS  --   --   --   --  6.3*  --    < > = values in this interval not displayed.   GFR CrCl cannot be calculated (Unknown ideal weight.). Liver Function Tests: Recent Labs  Lab 12/28/19 1609 12/28/19 1830 12/29/19 0933  AST 11* 13*  --   ALT 15 14  --   ALKPHOS 77 79  --   BILITOT 0.6 0.5  --   PROT 6.1* 6.4*  --   ALBUMIN 3.1* 3.1* 3.2*   No results for input(s): LIPASE, AMYLASE in the last 168 hours. No results for input(s): AMMONIA in the last 168 hours. Coagulation profile No results for input(s): INR, PROTIME in the last 168 hours. COVID-19 Labs  No results for input(s): DDIMER, FERRITIN, LDH, CRP in the last 72 hours.  Lab Results  Component Value Date   [place] NEGATIVE 12/29/2019   [place] NEGATIVE 04/07/2019    CBC: Recent Labs  Lab 12/28/19 1609 12/28/19 1830 12/29/19 0933 12/30/19 0132  WBC 7.8 8.1 7.9 7.7  NEUTROABS 5.6  --   --   --   HGB 6.4* 6.4* 8.0*  9.7*  HCT 20.2* 20.7* 25.4* 29.0*  MCV 90.2 92.4 91.0 87.1  PLT 224 224 201 218   Cardiac Enzymes: No results for input(s): CKTOTAL, CKMB, CKMBINDEX, TROPONINI in the last 168 hours. BNP (last 3 results) No results for input(s): PROBNP in the last 8760 hours. CBG:  Recent Labs  Lab 12/29/19 0918 12/29/19 1302 12/29/19 1625 12/29/19 2018  GLUCAP 103* 138* 152* 112*   D-Dimer: No results for input(s): DDIMER in the last 72 hours. Hgb A1c: Recent Labs    12/29/19 0933  HGBA1C 5.5   Lipid Profile: No results for input(s): CHOL, HDL, LDLCALC, TRIG, CHOLHDL, LDLDIRECT in the last 72 hours. Thyroid function studies: No results for input(s): TSH, T4TOTAL, T3FREE, THYROIDAB in the last 72 hours.  Invalid input(s): FREET3 Anemia work up: No results for input(s): VITAMINB12, FOLATE, FERRITIN, TIBC, IRON, RETICCTPCT in the last 72 hours. Sepsis Labs: Recent Labs  Lab 12/28/19 1609 12/28/19 1830 12/29/19 0933 12/30/19 0132  WBC 7.8 8.1 7.9 7.7   Microbiology Recent Results (from the past 240 hour(s))  SARS Coronavirus 2 by RT PCR (hospital order, performed in [facility] hospital lab) Nasopharyngeal Nasopharyngeal Swab     Status: None   Collection Time: 12/29/19  4:00 AM   Specimen: Nasopharyngeal Swab  Result Value Ref Range Status   SARS Coronavirus 2 NEGATIVE NEGATIVE Final    Comment: (NOTE) SARS-CoV-2 target nucleic acids are NOT DETECTED.  The SARS-CoV-2 RNA is generally detectable in upper and lower respiratory specimens during the acute phase of infection. The lowest concentration of SARS-CoV-2 viral copies this assay can detect is 250 copies / mL. A negative result does not preclude SARS-CoV-2 infection and should not be used as the sole basis for treatment or other patient management decisions.  A negative result may occur with improper specimen collection / handling, submission of specimen other than nasopharyngeal swab, presence of viral mutation(s) within  the areas targeted by this assay, and inadequate number of viral copies (<250 copies / mL). A negative result must be combined with clinical observations, patient history, and epidemiological information.  Fact Sheet for Patients:   StrictlyIdeas.no  Fact Sheet for Healthcare Providers: BankingDealers.co.za  This test is not yet approved or  cleared by the [place] FDA and has been authorized for detection and/or diagnosis of SARS-CoV-2 by FDA under an Emergency Use Authorization (EUA).  This EUA will remain in effect (meaning this test can be used) for the duration of the COVID-19 declaration under Section 564(b)(1) of the Act, 21 U.S.C. section 360bbb-3(b)(1), unless the authorization is terminated or revoked sooner.  Performed at [facility], [place] [address] [place], [place] [address]      Medications:   . heparin  5,000 Units Subcutaneous Q8H  . insulin aspart  0-6 Units Subcutaneous TID WC   Continuous Infusions: . sodium chloride    . furosemide Stopped (12/29/19 2123)      LOS: 1 day   Charlynne Cousins  [facility]  12/30/2019, 7:22 AM

## 2019-12-31 DIAGNOSIS — N049 Nephrotic syndrome with unspecified morphologic changes: Secondary | ICD-10-CM

## 2019-12-31 LAB — BASIC METABOLIC PANEL
Anion gap: 13 (ref 5–15)
BUN: 74 mg/dL — ABNORMAL HIGH (ref 8–23)
CO2: 19 mmol/L — ABNORMAL LOW (ref 22–32)
Calcium: 8.8 mg/dL — ABNORMAL LOW (ref 8.9–10.3)
Chloride: 106 mmol/L (ref 98–111)
Creatinine, Ser: 4.36 mg/dL — ABNORMAL HIGH (ref 0.44–1.00)
GFR calc Af Amer: 12 mL/min — ABNORMAL LOW (ref >60)
GFR calc non Af Amer: 10 mL/min — ABNORMAL LOW (ref >60)
Glucose, Bld: 93 mg/dL (ref 70–99)
Potassium: 4.8 mmol/L (ref 3.5–5.1)
Sodium: 138 mmol/L (ref 135–145)

## 2019-12-31 LAB — GLUCOSE, CAPILLARY
Glucose-Capillary: 90 mg/dL (ref 70–99)
Glucose-Capillary: 144 mg/dL — ABNORMAL HIGH (ref 70–99)
Glucose-Capillary: 132 mg/dL — ABNORMAL HIGH (ref 70–99)
Glucose-Capillary: 171 mg/dL — ABNORMAL HIGH (ref 70–99)

## 2019-12-31 MED ORDER — FUROSEMIDE 80 MG PO TABS
80.0000 mg | ORAL_TABLET | Freq: Two times a day (BID) | ORAL | Status: DC
  Administered 2019-12-31 – 2020-01-01 (×3): 80 mg via ORAL
  Filled 2019-12-31 (×3): qty 1

## 2019-12-31 NOTE — Progress Notes (Signed)
[facility] PROGRESS NOTE    Progress Note  Rachael Burke  PPI:[id number] DOB: [birth year] DOA: 12/28/2019 PCP: Horald Pollen, MD     Brief Narrative:   Rachael Burke is an 64 y.o. female past medical history of chronic kidney disease stage III admitted last year for anemia and worsening renal function, she migrated to [place] about 74-month prior to admission and noticed lower extremity edema.  Went to urgent care where she was found to be dyspneic and with worsening renal function and anemia.  Assessment/Plan:  Acute renal failure superimposed on chronic kidney disease ([place]) stage IV-V: With a creatinine of 1.5-1.7 on 05/04/2019, on admission of 4.75. Renal ultrasound showed no acute findings, chronic renal medical disease. Appears euvolemic on physical exam. Change Lasix to orals.  Anemia likely related to renal disease: 6 on admission she is being transfused 2 units of packed red blood cells to get CBC posttransfusion on.  Hyperkalemia: Improvement of potassium with IV Lasix patient metabolic panel is pending this morning.  Type 2 diabetes mellitus with complication, without long-term current use of insulin ([place]) With a hemoglobin A1c of 5.5, blood glucose has remained well controlled in house. Protein in urine.  DVT prophylaxis: lovenox Family Communication:Daughter Status is: Inpatient  Remains inpatient appropriate because:Hemodynamically unstable   Dispo: The patient is from: Home              Anticipated d/c is to: Home              Anticipated d/c date is: 2 days              Patient currently is not medically stable to d/c.     Code Status:     Code Status Orders  (From admission, onward)         Start     Ordered   12/29/19 0640  Full code  Continuous        12/29/19 0641        Code Status History    Date Active Date Inactive Code Status Order ID Comments User Context   04/08/2019 0045 04/10/2019 1617 Full  Code [id number]  Shela Leff, MD ED   Advance Care Planning Activity        IV Access:    Peripheral IV   Procedures and diagnostic studies:   US RENAL  Result Date: 12/29/2019 CLINICAL DATA:  Acute renal failure EXAM: RENAL / URINARY TRACT ULTRASOUND COMPLETE COMPARISON:  None. FINDINGS: Right Kidney: Renal measurements: 11.5 x 4.8 x 5.1 cm = volume: 147 mL. Slightly increased echotexture. No mass or hydronephrosis. Left Kidney: Renal measurements: 11.1 x 5.3 x 6.0 cm = volume: 185 mL. Slightly increased echotexture. No mass or hydronephrosis. Bladder: Appears normal for degree of bladder distention. Other: None. IMPRESSION: No acute findings.  No hydronephrosis. Electronically Signed   By: Rolm Baptise M.D.   On: 12/29/2019 11:30   VAS [place] LOWER EXTREMITY VENOUS (DVT)  Result Date: 12/30/2019  Lower Venous DVTStudy Indications: Edema.  Comparison Study: no prior Performing Technologist: Abram Sander RVS  Examination Guidelines: A complete evaluation includes B-mode imaging, spectral Doppler, color Doppler, and power Doppler as needed of all accessible portions of each vessel. Bilateral testing is considered an integral part of a complete examination. Limited examinations for reoccurring indications may be performed as noted. The reflux portion of the exam is performed with the patient in reverse Trendelenburg.  +---------+---------------+---------+-----------+----------+--------------+  RIGHT     Compressibility  Phasicity Spontaneity Properties Thrombus Aging  +---------+---------------+---------+-----------+----------+--------------+  CFV       Full            Yes       Yes                                    +---------+---------------+---------+-----------+----------+--------------+  SFJ       Full                                                             +---------+---------------+---------+-----------+----------+--------------+  FV Prox   Full                                                              +---------+---------------+---------+-----------+----------+--------------+  FV Mid    Full                                                             +---------+---------------+---------+-----------+----------+--------------+  FV Distal Full                                                             +---------+---------------+---------+-----------+----------+--------------+  PFV       Full                                                             +---------+---------------+---------+-----------+----------+--------------+  POP       Full            Yes       Yes                                    +---------+---------------+---------+-----------+----------+--------------+  PTV       Full                                                             +---------+---------------+---------+-----------+----------+--------------+  PERO      Full                                                             +---------+---------------+---------+-----------+----------+--------------+   +---------+---------------+---------+-----------+----------+--------------+  LEFT      Compressibility Phasicity Spontaneity Properties Thrombus Aging  +---------+---------------+---------+-----------+----------+--------------+  CFV       Full            Yes       Yes                                    +---------+---------------+---------+-----------+----------+--------------+  SFJ       Full                                                             +---------+---------------+---------+-----------+----------+--------------+  FV Prox   Full                                                             +---------+---------------+---------+-----------+----------+--------------+  FV Mid    Full                                                             +---------+---------------+---------+-----------+----------+--------------+  FV Distal Full                                                              +---------+---------------+---------+-----------+----------+--------------+  PFV       Full                                                             +---------+---------------+---------+-----------+----------+--------------+  POP       Full            Yes       Yes                                    +---------+---------------+---------+-----------+----------+--------------+  PTV       Full                                                             +---------+---------------+---------+-----------+----------+--------------+  PERO      Full                                                             +---------+---------------+---------+-----------+----------+--------------+  Summary: BILATERAL: - No evidence of deep vein thrombosis seen in the lower extremities, bilaterally. - No evidence of superficial venous thrombosis in the lower extremities, bilaterally. - RIGHT: - There is no evidence of chronic venous insufficiency.   *See table(s) above for measurements and observations. Electronically signed by Deitra Mayo MD on 12/30/2019 at 12:57:24 PM.    Final      Medical Consultants:    None.  Anti-Infectives:   none  Subjective:    Rachael Burke no new complaints.  Objective:    Vitals:   12/30/19 0854 12/30/19 1731 12/30/19 1948 12/31/19 0500  BP: (!) 119/58 (!) 142/70 (!) 140/54   Pulse: 81 78 78   Resp: 20 20 14    Temp: 98.7 F (37.1 C) 98.7 F (37.1 C) 98.3 F (36.8 C)   TempSrc:  Oral    SpO2: 96% 99% 97%   Weight:    70.4 kg   SpO2: 97 %   Intake/Output Summary (Last 24 hours) at 12/31/2019 0704 Last data filed at 12/31/2019 0500 Gross per 24 hour  Intake 306 ml  Output 1700 ml  Net -1394 ml   Filed Weights   12/30/19 0500 12/31/19 0500  Weight: 71.6 kg 70.4 kg    Exam: General exam: In no acute distress. Respiratory system: Good air movement and clear to auscultation. Cardiovascular system: S1 & S2 heard, RRR. No JVD. Gastrointestinal  system: Abdomen is nondistended, soft and nontender.  Extremities: No pedal edema. Skin: No rashes, lesions or ulcers Psychiatry: Judgement and insight appear normal. Mood & affect appropriate.   Data Reviewed:    Labs: Basic Metabolic Panel: Recent Labs  Lab 12/28/19 1609 12/28/19 1609 12/28/19 1830 12/28/19 1830 12/29/19 0933 12/30/19 0132  NA 138  --  137  --  135 140  K 5.6*   < > 5.7*   < > 5.3* 5.2*  CL 111  --  111  --  109 111  CO2 19*  --  17*  --  15* 19*  GLUCOSE 133*  --  119*  --  111* 97  BUN 83*  --  83*  --  80* 76*  CREATININE 4.85*  --  4.75*  --  4.50*   4.54* 4.54*  CALCIUM 8.0*  --  8.0*  --  8.2* 8.4*  PHOS  --   --   --   --  6.3*  --    < > = values in this interval not displayed.   GFR CrCl cannot be calculated (Unknown ideal weight.). Liver Function Tests: Recent Labs  Lab 12/28/19 1609 12/28/19 1830 12/29/19 0933  AST 11* 13*  --   ALT 15 14  --   ALKPHOS 77 79  --   BILITOT 0.6 0.5  --   PROT 6.1* 6.4*  --   ALBUMIN 3.1* 3.1* 3.2*   No results for input(s): LIPASE, AMYLASE in the last 168 hours. No results for input(s): AMMONIA in the last 168 hours. Coagulation profile No results for input(s): INR, PROTIME in the last 168 hours. COVID-19 Labs  No results for input(s): DDIMER, FERRITIN, LDH, CRP in the last 72 hours.  Lab Results  Component Value Date   SARSCOV2NAA NEGATIVE 12/29/2019   [place] NEGATIVE 04/07/2019    CBC: Recent Labs  Lab 12/28/19 1609 12/28/19 1830 12/29/19 0933 12/30/19 0132  WBC 7.8 8.1 7.9 7.7  NEUTROABS 5.6  --   --   --   HGB 6.4* 6.4* 8.0*  9.7*  HCT 20.2* 20.7* 25.4* 29.0*  MCV 90.2 92.4 91.0 87.1  PLT 224 224 201 218   Cardiac Enzymes: No results for input(s): CKTOTAL, CKMB, CKMBINDEX, TROPONINI in the last 168 hours. BNP (last 3 results) No results for input(s): PROBNP in the last 8760 hours. CBG: Recent Labs  Lab 12/29/19 1625 12/29/19 2018 12/30/19 0745 12/30/19 1105  12/30/19 1610  GLUCAP 152* 112* 88 130* 156*   D-Dimer: No results for input(s): DDIMER in the last 72 hours. Hgb A1c: Recent Labs    12/29/19 0933  HGBA1C 5.5   Lipid Profile: No results for input(s): CHOL, HDL, LDLCALC, TRIG, CHOLHDL, LDLDIRECT in the last 72 hours. Thyroid function studies: No results for input(s): TSH, T4TOTAL, T3FREE, THYROIDAB in the last 72 hours.  Invalid input(s): FREET3 Anemia work up: No results for input(s): VITAMINB12, FOLATE, FERRITIN, TIBC, IRON, RETICCTPCT in the last 72 hours. Sepsis Labs: Recent Labs  Lab 12/28/19 1609 12/28/19 1830 12/29/19 0933 12/30/19 0132  WBC 7.8 8.1 7.9 7.7   Microbiology Recent Results (from the past 240 hour(s))  SARS Coronavirus 2 by RT PCR (hospital order, performed in [facility] hospital lab) Nasopharyngeal Nasopharyngeal Swab     Status: None   Collection Time: 12/29/19  4:00 AM   Specimen: Nasopharyngeal Swab  Result Value Ref Range Status   SARS Coronavirus 2 NEGATIVE NEGATIVE Final    Comment: (NOTE) SARS-CoV-2 target nucleic acids are NOT DETECTED.  The SARS-CoV-2 RNA is generally detectable in upper and lower respiratory specimens during the acute phase of infection. The lowest concentration of SARS-CoV-2 viral copies this assay can detect is 250 copies / mL. A negative result does not preclude SARS-CoV-2 infection and should not be used as the sole basis for treatment or other patient management decisions.  A negative result may occur with improper specimen collection / handling, submission of specimen other than nasopharyngeal swab, presence of viral mutation(s) within the areas targeted by this assay, and inadequate number of viral copies (<250 copies / mL). A negative result must be combined with clinical observations, patient history, and epidemiological information.  Fact Sheet for Patients:   StrictlyIdeas.no  Fact Sheet for Healthcare  Providers: BankingDealers.co.za  This test is not yet approved or  cleared by the [place] FDA and has been authorized for detection and/or diagnosis of SARS-CoV-2 by FDA under an Emergency Use Authorization (EUA).  This EUA will remain in effect (meaning this test can be used) for the duration of the COVID-19 declaration under Section 564(b)(1) of the Act, 21 U.S.C. section 360bbb-3(b)(1), unless the authorization is terminated or revoked sooner.  Performed at [facility], [place] [address] [place], [place] [address]      Medications:    heparin  5,000 Units Subcutaneous Q8H   insulin aspart  0-6 Units Subcutaneous TID WC   Continuous Infusions:  sodium chloride     furosemide 120 mg (12/30/19 2144)      LOS: 2 days   Charlynne Cousins  [facility]  12/31/2019, 7:04 AM

## 2020-01-01 LAB — BASIC METABOLIC PANEL
Anion gap: 11 (ref 5–15)
BUN: 80 mg/dL — ABNORMAL HIGH (ref 8–23)
CO2: 21 mmol/L — ABNORMAL LOW (ref 22–32)
Calcium: 8.8 mg/dL — ABNORMAL LOW (ref 8.9–10.3)
Chloride: 107 mmol/L (ref 98–111)
Creatinine, Ser: 4.5 mg/dL — ABNORMAL HIGH (ref 0.44–1.00)
GFR calc Af Amer: 11 mL/min — ABNORMAL LOW (ref >60)
GFR calc non Af Amer: 10 mL/min — ABNORMAL LOW (ref >60)
Glucose, Bld: 101 mg/dL — ABNORMAL HIGH (ref 70–99)
Potassium: 5.1 mmol/L (ref 3.5–5.1)
Sodium: 139 mmol/L (ref 135–145)

## 2020-01-01 LAB — GLUCOSE, CAPILLARY: Glucose-Capillary: 98 mg/dL (ref 70–99)

## 2020-01-01 MED ORDER — FUROSEMIDE 80 MG PO TABS: 80.0000 mg | ORAL_TABLET | Freq: Two times a day (BID) | ORAL | 3 refills | Status: DC

## 2020-01-01 MED ORDER — ATORVASTATIN CALCIUM 20 MG PO TABS: 20.0000 mg | ORAL_TABLET | Freq: Every day | ORAL | 0 refills | Status: DC

## 2020-01-01 MED ORDER — LINAGLIPTIN 5 MG PO TABS: 5.0000 mg | ORAL_TABLET | Freq: Every day | ORAL | 0 refills | Status: DC

## 2020-01-01 MED ORDER — SODIUM ZIRCONIUM CYCLOSILICATE 5 G PO PACK
5.0000 g | PACK | Freq: Every day | ORAL | Status: DC
  Administered 2020-01-01: 5 g via ORAL
  Filled 2020-01-01 (×2): qty 1

## 2020-01-01 NOTE — Progress Notes (Signed)
Patient was stable at discharge. I removed her IVs. We reviewed the discharge education. Patient/Family verbalized understanding and had no further questions. Patient left with prescriptions in hand.

## 2020-01-01 NOTE — Discharge Summary (Signed)
Physician Discharge Summary  Rachael Burke XHB:[id number] DOB: 08-06-1955 DOA: 12/28/2019  PCP: Horald Pollen, MD  Admit date: 12/28/2019 Discharge date: 01/01/2020  Admitted From: Home Disposition:  Home  Recommendations for Outpatient Follow-up:  1. Follow up with Nephrology in 1-2 weeks 2. Please obtain BMP/CBC in one week 3.   Home Health:No Equipment/Devices:None  Discharge Condition:Stable CODE STATUS:Full Diet recommendation: Heart Healthy  Brief/Interim Summary: 64 y.o. female past medical history of chronic kidney disease stage III admitted last year for anemia and worsening renal function, she migrated to [place] about 58-month prior to admission and noticed lower extremity edema.  Went to urgent care where she was found to be dyspneic and with worsening renal function.  Discharge Diagnoses:  Principal Problem:   Acute renal failure superimposed on chronic kidney disease (HCC) Active Problems:   Type 2 diabetes mellitus with complication, without long-term current use of insulin (HCC)   Symptomatic anemia   HLD (hyperlipidemia)   Anemia associated with chronic renal failure   Hyperkalemia  Acute renal failure superimposed on chronic kidney disease stage IV: With a creatinine of 1.7 2020 on admission 4.7. Renal ultrasound was done that showed no acute findings but did show chronic medical disease. She was started on IV Lasix as she diuresed peers She was transitioned to oral Lasix which she can continue as an outpatient. The patient has an appointment with a nephrologist in 01/08/2020. ACE inhibitor was discontinued.  Anemia of renal disease: On admission her hemoglobin was 6 she was transfused 2 units of packed red blood cells CBC posttransfusion showed a hemoglobin of 9.  Hyperkalemia: Early in the setting of acute renal failure and ACE inhibitor these were discontinued she was started on IV Lasix and her hyperkalemia resolved.  Diabetes  mellitus type 2 without complications: With an A1c of 4.5 her blood glucose remained well controlled.   Discharge Instructions  Discharge Instructions    Diet - low sodium heart healthy   Complete by: As directed    Increase activity slowly   Complete by: As directed      Allergies as of 01/01/2020   No Known Allergies     Medication List    STOP taking these medications   allopurinol 150 mg Tabs tablet Commonly known as: ZYLOPRIM   amLODIPine-Valsartan-HCTZ 10-320-25 MG Tabs   ASPIRIN PO   moxifloxacin 400 MG tablet Commonly known as: AVELOX   NON FORMULARY   NON FORMULARY   NON FORMULARY   pregabalin 75 MG capsule Commonly known as: LYRICA     TAKE these medications   atorvastatin 20 MG tablet Commonly known as: LIPITOR Take 20 mg by mouth daily.   furosemide 80 MG tablet Commonly known as: LASIX Take 1 tablet (80 mg total) by mouth 2 (two) times daily.   linagliptin 5 MG Tabs tablet Commonly known as: TRADJENTA Take 5 mg by mouth daily.       No Known Allergies  Consultations:  None   Procedures/Studies: US RENAL  Result Date: 12/29/2019 CLINICAL DATA:  Acute renal failure EXAM: RENAL / URINARY TRACT ULTRASOUND COMPLETE COMPARISON:  None. FINDINGS: Right Kidney: Renal measurements: 11.5 x 4.8 x 5.1 cm = volume: 147 mL. Slightly increased echotexture. No mass or hydronephrosis. Left Kidney: Renal measurements: 11.1 x 5.3 x 6.0 cm = volume: 185 mL. Slightly increased echotexture. No mass or hydronephrosis. Bladder: Appears normal for degree of bladder distention. Other: None. IMPRESSION: No acute findings.  No hydronephrosis. Electronically Signed  By: Rolm Baptise M.D.   On: 12/29/2019 11:30   DG Chest Portable 1 View  Result Date: 12/29/2019 CLINICAL DATA:  Lower extremity and facial edema EXAM: PORTABLE CHEST 1 VIEW COMPARISON:  None. FINDINGS: Lungs are clear. Heart is mildly enlarged with pulmonary vascularity normal. No adenopathy. There is  aortic atherosclerosis. No bone lesions. IMPRESSION: Mild cardiac enlargement.  Lungs clear. Aortic Atherosclerosis (ICD10-I70.0). Electronically Signed   By: Lowella Grip III M.D.   On: 12/29/2019 04:50   VAS [place] LOWER EXTREMITY VENOUS (DVT)  Result Date: 12/30/2019  Lower Venous DVTStudy Indications: Edema.  Comparison Study: no prior Performing Technologist: Abram Sander RVS  Examination Guidelines: A complete evaluation includes B-mode imaging, spectral Doppler, color Doppler, and power Doppler as needed of all accessible portions of each vessel. Bilateral testing is considered an integral part of a complete examination. Limited examinations for reoccurring indications may be performed as noted. The reflux portion of the exam is performed with the patient in reverse Trendelenburg.  +---------+---------------+---------+-----------+----------+--------------+ RIGHT    CompressibilityPhasicitySpontaneityPropertiesThrombus Aging +---------+---------------+---------+-----------+----------+--------------+ CFV      Full           Yes      Yes                                 +---------+---------------+---------+-----------+----------+--------------+ SFJ      Full                                                        +---------+---------------+---------+-----------+----------+--------------+ FV Prox  Full                                                        +---------+---------------+---------+-----------+----------+--------------+ FV Mid   Full                                                        +---------+---------------+---------+-----------+----------+--------------+ FV DistalFull                                                        +---------+---------------+---------+-----------+----------+--------------+ PFV      Full                                                        +---------+---------------+---------+-----------+----------+--------------+ POP       Full           Yes      Yes                                 +---------+---------------+---------+-----------+----------+--------------+  PTV      Full                                                        +---------+---------------+---------+-----------+----------+--------------+ PERO     Full                                                        +---------+---------------+---------+-----------+----------+--------------+   +---------+---------------+---------+-----------+----------+--------------+ LEFT     CompressibilityPhasicitySpontaneityPropertiesThrombus Aging +---------+---------------+---------+-----------+----------+--------------+ CFV      Full           Yes      Yes                                 +---------+---------------+---------+-----------+----------+--------------+ SFJ      Full                                                        +---------+---------------+---------+-----------+----------+--------------+ FV Prox  Full                                                        +---------+---------------+---------+-----------+----------+--------------+ FV Mid   Full                                                        +---------+---------------+---------+-----------+----------+--------------+ FV DistalFull                                                        +---------+---------------+---------+-----------+----------+--------------+ PFV      Full                                                        +---------+---------------+---------+-----------+----------+--------------+ POP      Full           Yes      Yes                                 +---------+---------------+---------+-----------+----------+--------------+ PTV      Full                                                        +---------+---------------+---------+-----------+----------+--------------+  PERO     Full                                                         +---------+---------------+---------+-----------+----------+--------------+     Summary: BILATERAL: - No evidence of deep vein thrombosis seen in the lower extremities, bilaterally. - No evidence of superficial venous thrombosis in the lower extremities, bilaterally. - RIGHT: - There is no evidence of chronic venous insufficiency.   *See table(s) above for measurements and observations. Electronically signed by Deitra Mayo MD on 12/30/2019 at 12:57:24 PM.    Final       Subjective: No new complaints.  Discharge Exam: Vitals:   12/31/19 1450 12/31/19 2152  BP: (!) 108/50 127/68  Pulse: 72 74  Resp: 14 16  Temp: 98.5 F (36.9 C) 97.9 F (36.6 C)  SpO2: 94% 99%   Vitals:   12/31/19 0500 12/31/19 0754 12/31/19 1450 12/31/19 2152  BP:  (!) 114/53 (!) 108/50 127/68  Pulse:  70 72 74  Resp:  13 14 16   Temp:  98.6 F (37 C) 98.5 F (36.9 C) 97.9 F (36.6 C)  TempSrc:    Oral  SpO2:  94% 94% 99%  Weight: 70.4 kg       General: Pt is alert, awake, not in acute distress Cardiovascular: RRR, S1/S2 +, no rubs, no gallops Respiratory: CTA bilaterally, no wheezing, no rhonchi Abdominal: Soft, NT, ND, bowel sounds + Extremities: no edema, no cyanosis    The results of significant diagnostics from this hospitalization (including imaging, microbiology, ancillary and laboratory) are listed below for reference.     Microbiology: Recent Results (from the past 240 hour(s))  SARS Coronavirus 2 by RT PCR (hospital order, performed in [facility] hospital lab) Nasopharyngeal Nasopharyngeal Swab     Status: None   Collection Time: 12/29/19  4:00 AM   Specimen: Nasopharyngeal Swab  Result Value Ref Range Status   SARS Coronavirus 2 NEGATIVE NEGATIVE Final    Comment: (NOTE) SARS-CoV-2 target nucleic acids are NOT DETECTED.  The SARS-CoV-2 RNA is generally detectable in upper and lower respiratory specimens during the acute phase of infection. The  lowest concentration of SARS-CoV-2 viral copies this assay can detect is 250 copies / mL. A negative result does not preclude SARS-CoV-2 infection and should not be used as the sole basis for treatment or other patient management decisions.  A negative result may occur with improper specimen collection / handling, submission of specimen other than nasopharyngeal swab, presence of viral mutation(s) within the areas targeted by this assay, and inadequate number of viral copies (<250 copies / mL). A negative result must be combined with clinical observations, patient history, and epidemiological information.  Fact Sheet for Patients:   StrictlyIdeas.no  Fact Sheet for Healthcare Providers: BankingDealers.co.za  This test is not yet approved or  cleared by the [place] FDA and has been authorized for detection and/or diagnosis of SARS-CoV-2 by FDA under an Emergency Use Authorization (EUA).  This EUA will remain in effect (meaning this test can be used) for the duration of the COVID-19 declaration under Section 564(b)(1) of the Act, 21 U.S.C. section 360bbb-3(b)(1), unless the authorization is terminated or revoked sooner.  Performed at [facility], [place] [address], [place], [place] [address]      Labs: BNP (  last 3 results) No results for input(s): BNP in the last 8760 hours. Basic Metabolic Panel: Recent Labs  Lab 12/28/19 1830 12/29/19 0933 12/30/19 0132 12/31/19 0900 01/01/20 0050  NA 137 135 140 138 139  K 5.7* 5.3* 5.2* 4.8 5.1  CL 111 109 111 106 107  CO2 17* 15* 19* 19* 21*  GLUCOSE 119* 111* 97 93 101*  BUN 83* 80* 76* 74* 80*  CREATININE 4.75* 4.50*  4.54* 4.54* 4.36* 4.50*  CALCIUM 8.0* 8.2* 8.4* 8.8* 8.8*  PHOS  --  6.3*  --   --   --    Liver Function Tests: Recent Labs  Lab 12/28/19 1609 12/28/19 1830 12/29/19 0933  AST 11* 13*  --   ALT 15 14  --   ALKPHOS 77 79  --   BILITOT 0.6 0.5  --    PROT 6.1* 6.4*  --   ALBUMIN 3.1* 3.1* 3.2*   No results for input(s): LIPASE, AMYLASE in the last 168 hours. No results for input(s): AMMONIA in the last 168 hours. CBC: Recent Labs  Lab 12/28/19 1609 12/28/19 1830 12/29/19 0933 12/30/19 0132  WBC 7.8 8.1 7.9 7.7  NEUTROABS 5.6  --   --   --   HGB 6.4* 6.4* 8.0* 9.7*  HCT 20.2* 20.7* 25.4* 29.0*  MCV 90.2 92.4 91.0 87.1  PLT 224 224 201 218   Cardiac Enzymes: No results for input(s): CKTOTAL, CKMB, CKMBINDEX, TROPONINI in the last 168 hours. BNP: Invalid input(s): POCBNP CBG: Recent Labs  Lab 12/30/19 1610 12/31/19 0830 12/31/19 1226 12/31/19 1607 12/31/19 2145  GLUCAP 156* 90 144* 132* 171*   D-Dimer No results for input(s): DDIMER in the last 72 hours. Hgb A1c Recent Labs    12/29/19 0933  HGBA1C 5.5   Lipid Profile No results for input(s): CHOL, HDL, LDLCALC, TRIG, CHOLHDL, LDLDIRECT in the last 72 hours. Thyroid function studies No results for input(s): TSH, T4TOTAL, T3FREE, THYROIDAB in the last 72 hours.  Invalid input(s): FREET3 Anemia work up No results for input(s): VITAMINB12, FOLATE, FERRITIN, TIBC, IRON, RETICCTPCT in the last 72 hours. Urinalysis    Component Value Date/Time   COLORURINE YELLOW 12/29/2019 0852   APPEARANCEUR CLEAR 12/29/2019 0852   LABSPEC 1.008 12/29/2019 0852   PHURINE 5.0 12/29/2019 0852   GLUCOSEU NEGATIVE 12/29/2019 0852   HGBUR NEGATIVE 12/29/2019 0852   [place] NEGATIVE 12/29/2019 0852   KETONESUR NEGATIVE 12/29/2019 0852   PROTEINUR 100 (A) 12/29/2019 0852   NITRITE NEGATIVE 12/29/2019 0852   LEUKOCYTESUR TRACE (A) 12/29/2019 0852   Sepsis Labs Invalid input(s): PROCALCITONIN,  WBC,  LACTICIDVEN Microbiology Recent Results (from the past 240 hour(s))  SARS Coronavirus 2 by RT PCR (hospital order, performed in [facility] hospital lab) Nasopharyngeal Nasopharyngeal Swab     Status: None   Collection Time: 12/29/19  4:00 AM   Specimen: Nasopharyngeal Swab   Result Value Ref Range Status   SARS Coronavirus 2 NEGATIVE NEGATIVE Final    Comment: (NOTE) SARS-CoV-2 target nucleic acids are NOT DETECTED.  The SARS-CoV-2 RNA is generally detectable in upper and lower respiratory specimens during the acute phase of infection. The lowest concentration of SARS-CoV-2 viral copies this assay can detect is 250 copies / mL. A negative result does not preclude SARS-CoV-2 infection and should not be used as the sole basis for treatment or other patient management decisions.  A negative result may occur with improper specimen collection / handling, submission of specimen other than nasopharyngeal swab, presence of  viral mutation(s) within the areas targeted by this assay, and inadequate number of viral copies (<250 copies / mL). A negative result must be combined with clinical observations, patient history, and epidemiological information.  Fact Sheet for Patients:   StrictlyIdeas.no  Fact Sheet for Healthcare Providers: BankingDealers.co.za  This test is not yet approved or  cleared by the [place] FDA and has been authorized for detection and/or diagnosis of SARS-CoV-2 by FDA under an Emergency Use Authorization (EUA).  This EUA will remain in effect (meaning this test can be used) for the duration of the COVID-19 declaration under Section 564(b)(1) of the Act, 21 U.S.C. section 360bbb-3(b)(1), unless the authorization is terminated or revoked sooner.  Performed at [facility], [place] [address] [place], [place] [address]      Time coordinating discharge: Over 30 minutes  SIGNED:   Charlynne Cousins, MD  [facility] 01/01/2020, 6:58 AM Pager   If 7PM-7AM, please contact night-coverage [email or web address] Password [email or web address]

## 2020-01-01 NOTE — Plan of Care (Signed)

## 2020-01-01 NOTE — Plan of Care (Signed)
Care plan goals met. Pt adequate for discharge.   Problem: Education: Goal: Knowledge of General Education information will improve Description: Including pain rating scale, medication(s)/side effects and non-pharmacologic comfort measures Outcome: Adequate for Discharge   Problem: Health Behavior/Discharge Planning: Goal: Ability to manage health-related needs will improve Outcome: Adequate for Discharge   Problem: Clinical Measurements: Goal: Ability to maintain clinical measurements within normal limits will improve Outcome: Adequate for Discharge   Problem: Clinical Measurements: Goal: Will remain free from infection Outcome: Adequate for Discharge   Problem: Clinical Measurements: Goal: Diagnostic test results will improve Outcome: Adequate for Discharge   Problem: Clinical Measurements: Goal: Respiratory complications will improve Outcome: Adequate for Discharge   Problem: Clinical Measurements: Goal: Cardiovascular complication will be avoided Outcome: Adequate for Discharge   Problem: Education: Goal: Knowledge of General Education information will improve Description: Including pain rating scale, medication(s)/side effects and non-pharmacologic comfort measures Outcome: Adequate for Discharge   Problem: Health Behavior/Discharge Planning: Goal: Ability to manage health-related needs will improve Outcome: Adequate for Discharge   Problem: Clinical Measurements: Goal: Ability to maintain clinical measurements within normal limits will improve Outcome: Adequate for Discharge Goal: Will remain free from infection Outcome: Adequate for Discharge Goal: Diagnostic test results will improve Outcome: Adequate for Discharge Goal: Respiratory complications will improve Outcome: Adequate for Discharge Goal: Cardiovascular complication will be avoided Outcome: Adequate for Discharge   Problem: Activity: Goal: Risk for activity intolerance will decrease Outcome:  Adequate for Discharge   Problem: Nutrition: Goal: Adequate nutrition will be maintained Outcome: Adequate for Discharge   Problem: Coping: Goal: Level of anxiety will decrease Outcome: Adequate for Discharge   Problem: Elimination: Goal: Will not experience complications related to bowel motility Outcome: Adequate for Discharge Goal: Will not experience complications related to urinary retention Outcome: Adequate for Discharge   Problem: Pain Managment: Goal: General experience of comfort will improve Outcome: Adequate for Discharge   Problem: Safety: Goal: Ability to remain free from injury will improve Outcome: Adequate for Discharge   Problem: Skin Integrity: Goal: Risk for impaired skin integrity will decrease Outcome: Adequate for Discharge

## 2020-01-01 NOTE — TOC Transition Note (Signed)
Transition of Care [facility]) - CM/SW Discharge Note   Patient Details  Name: Rachael Burke MRN: [id number] Date of Birth: 1955/12/09  [facility] CM/SW Contact:  Joanne Chars, LCSW Phone Number: 01/01/2020, 11:15 AM   Clinical Narrative:   CSW met with pt to discuss medication.  Daughter Herb Grays in room and able to translate.  Pt uses [facility] but has been in [place] recently, does need assistance with medication.  Discussed [facility] and also match option.  Pt does have PCP in place.  Daughter had questions about medicaid and disability.  Pt has applied for both and does have medicaid caseworker that daughter is in contact with.  Daughter aware of calling social security for updates on disability.  Vida Roller, RN, completed Match for [place].  No further needs.    Final next level of care: Home/Self Care Barriers to Discharge: No Barriers Identified   Patient Goals and CMS Choice        Discharge Placement                       Discharge Plan and Services                                     Social Determinants of Health (SDOH) Interventions     Readmission Risk Interventions No flowsheet data found.

## 2020-02-16 ENCOUNTER — Other Ambulatory Visit: Payer: Self-pay

## 2020-02-16 DIAGNOSIS — N178 Other acute kidney failure: Secondary | ICD-10-CM

## 2020-02-29 DIAGNOSIS — N2581 Secondary hyperparathyroidism of renal origin: Secondary | ICD-10-CM | POA: Diagnosis not present

## 2020-02-29 DIAGNOSIS — N184 Chronic kidney disease, stage 4 (severe): Secondary | ICD-10-CM | POA: Diagnosis not present

## 2020-02-29 DIAGNOSIS — D631 Anemia in chronic kidney disease: Secondary | ICD-10-CM | POA: Diagnosis not present

## 2020-02-29 DIAGNOSIS — E875 Hyperkalemia: Secondary | ICD-10-CM | POA: Diagnosis not present

## 2020-02-29 DIAGNOSIS — E785 Hyperlipidemia, unspecified: Secondary | ICD-10-CM | POA: Diagnosis not present

## 2020-02-29 DIAGNOSIS — E1122 Type 2 diabetes mellitus with diabetic chronic kidney disease: Secondary | ICD-10-CM | POA: Diagnosis not present

## 2020-03-01 ENCOUNTER — Ambulatory Visit (HOSPITAL_COMMUNITY)
Admission: RE | Admit: 2020-03-01 | Discharge: 2020-03-01 | Disposition: A | Discharge status: Home / Self Care | Payer: Self-pay | Source: Ambulatory Visit | Attending: Vascular Surgery | Admitting: Vascular Surgery

## 2020-03-01 ENCOUNTER — Encounter: Payer: Self-pay | Admitting: Vascular Surgery

## 2020-03-01 ENCOUNTER — Ambulatory Visit (INDEPENDENT_AMBULATORY_CARE_PROVIDER_SITE_OTHER): Payer: Self-pay | Admitting: Vascular Surgery

## 2020-03-01 ENCOUNTER — Ambulatory Visit (INDEPENDENT_AMBULATORY_CARE_PROVIDER_SITE_OTHER)
Admission: RE | Admit: 2020-03-01 | Discharge: 2020-03-01 | Disposition: A | Discharge status: Home / Self Care | Payer: Self-pay | Source: Ambulatory Visit | Attending: Vascular Surgery | Admitting: Vascular Surgery

## 2020-03-01 ENCOUNTER — Other Ambulatory Visit: Payer: Self-pay

## 2020-03-01 VITALS — BP 161/92 | HR 69 | Temp 97.6°F | Resp 20 | Ht 63.0 in | Wt 153.0 lb

## 2020-03-01 DIAGNOSIS — N178 Other acute kidney failure: Secondary | ICD-10-CM | POA: Insufficient documentation

## 2020-03-01 DIAGNOSIS — N185 Chronic kidney disease, stage 5: Secondary | ICD-10-CM | POA: Insufficient documentation

## 2020-03-01 DIAGNOSIS — N184 Chronic kidney disease, stage 4 (severe): Secondary | ICD-10-CM

## 2020-03-01 NOTE — Progress Notes (Signed)
Patient ID: Rachael Burke, female   DOB: 1955/06/20, 64 y.o.   MRN: [id number]  Reason for Consult: Follow-up   Referred by Horald Pollen, *  Subjective:     HPI:  Rachael Burke is a 64 y.o. female with chronic kidney disease she has never been on dialysis in the past.  She has no previous dialysis access.  She has no previous upper extremity, chest, breast surgery.  She is right-hand dominant.  Past Medical History:  Diagnosis Date  . Anemia   . Anxiety   . Diabetes mellitus without complication ([place])   . Hyperlipidemia   . Hypertension   . Renal disorder    Family History  Problem Relation Age of Onset  . Diabetes Mother   . Hypertension Father   . Diabetes Sister   . Diabetes Brother   . Diabetes Brother    Past Surgical History:  Procedure Laterality Date  . CESAREAN SECTION      Short Social History:  Social History   Tobacco Use  . Smoking status: Never Smoker  . Smokeless tobacco: Never Used  Substance Use Topics  . Alcohol use: Never    No Known Allergies  Current Outpatient Medications  Medication Sig Dispense Refill  . atorvastatin (LIPITOR) 20 MG tablet Take 1 tablet (20 mg total) by mouth daily. 30 tablet 0  . furosemide (LASIX) 80 MG tablet Take 1 tablet (80 mg total) by mouth 2 (two) times daily. 60 tablet 3  . labetalol (NORMODYNE) 200 MG tablet Take 200 mg by mouth 2 (two) times daily.    Marland Kitchen linagliptin (TRADJENTA) 5 MG TABS tablet Take 1 tablet (5 mg total) by mouth daily. 30 tablet 0   No current facility-administered medications for this visit.    Review of Systems  Constitutional:  Constitutional negative. HENT: HENT negative.  Eyes: Eyes negative.  Respiratory: Positive for shortness of breath.  Cardiovascular: Positive for dyspnea with exertion.  GI: Gastrointestinal negative.  Musculoskeletal: Musculoskeletal negative.  Skin: Skin negative.  Neurological: Positive for dizziness.  Hematologic:  Hematologic/lymphatic negative.  Psychiatric: Psychiatric negative.        Objective:  Objective   Vitals:   03/01/20 1407  BP: (!) 161/92  Pulse: 69  Resp: 20  Temp: 97.6 F (36.4 C)  SpO2: 98%  Weight: 153 lb (69.4 kg)  Height: 5\' 3"  (1.6 m)   Body mass index is 27.1 kg/m.  Physical Exam HENT:     Head: Normocephalic.     Nose:     Comments: Wearing a mask Eyes:     Pupils: Pupils are equal, round, and reactive to light.  Cardiovascular:     Pulses:          Radial pulses are 2+ on the right side and 2+ on the left side.  Pulmonary:     Effort: Pulmonary effort is normal.  Abdominal:     General: Abdomen is flat.  Musculoskeletal:        General: No swelling. Normal range of motion.     Cervical back: Normal range of motion and neck supple. No tenderness.  Skin:    General: Skin is warm.     Capillary Refill: Capillary refill takes less than 2 seconds.  Neurological:     General: No focal deficit present.     Mental Status: She is alert.  Psychiatric:        Mood and Affect: Mood normal.  Behavior: Behavior normal.        Thought Content: Thought content normal.        Judgment: Judgment normal.     Data: +--------------+-------------+----------+--------+  Right CephalicDiameter (cm)Depth (cm)Findings  +--------------+-------------+----------+--------+  Shoulder     0.18              +--------------+-------------+----------+--------+  Mid upper arm   0.06              +--------------+-------------+----------+--------+  Prox forearm   0.17              +--------------+-------------+----------+--------+  Mid forearm    0.24              +--------------+-------------+----------+--------+  Dist forearm   0.11              +--------------+-------------+----------+--------+   +-----------------+-------------+----------+--------+  Right Basilic   Diameter (cm)Depth (cm)Findings  +-----------------+-------------+----------+--------+  Mid upper arm    0.35              +-----------------+-------------+----------+--------+  Dist upper arm    0.24              +-----------------+-------------+----------+--------+  Antecubital fossa  0.23         medial   +-----------------+-------------+----------+--------+   +--------------+-------------+----------+--------+  Left Cephalic Diameter (cm)Depth (cm)Findings  +--------------+-------------+----------+--------+  Shoulder     0.13              +--------------+-------------+----------+--------+  Mid upper arm   0.18              +--------------+-------------+----------+--------+  Dist upper arm  0.11              +--------------+-------------+----------+--------+  Prox forearm   1.11              +--------------+-------------+----------+--------+  Mid forearm    0.14              +--------------+-------------+----------+--------+   +-----------------+-------------+----------+--------+  Left Basilic   Diameter (cm)Depth (cm)Findings  +-----------------+-------------+----------+--------+  Mid upper arm    0.37              +-----------------+-------------+----------+--------+  Dist upper arm    0.19              +-----------------+-------------+----------+--------+  Antecubital fossa  0.16         medial   +-----------------+-------------+----------+--------+   Right Pre-Dialysis Findings:  +-----------------------+----------+--------------------+---------+--------  +  Location        PSV (cm/s)Intralum. Diam. (cm)Waveform  Comments  +-----------------------+----------+--------------------+---------+--------  +  Brachial Antecub. fossa129    0.44         triphasic        +-----------------------+----------+--------------------+---------+--------  +  Radial Art at Wrist  86    0.25        triphasic        +-----------------------+----------+--------------------+---------+--------  +  Ulnar Art at Wrist   32    0.14        biphasic         +-----------------------+----------+--------------------+---------+--------  +      Left Pre-Dialysis Findings:  +-----------------------+----------+--------------------+---------+--------  +  Location        PSV (cm/s)Intralum. Diam. (cm)Waveform  Comments  +-----------------------+----------+--------------------+---------+--------  +  Brachial Antecub. fossa121    0.42        triphasic        +-----------------------+----------+--------------------+---------+--------  +  Radial Art at Wrist  84    0.23        biphasic         +-----------------------+----------+--------------------+---------+--------  +  Ulnar Art at Wrist   100    0.22        biphasic         +-----------------------+----------+--------------------+---------+--------  +      Assessment/Plan:    64 year old for she has never been on dialysis does not need urgent access.  Does not appear to have suitable vein on preoperative duplex I have elected for right basilic vein with ultrasound and the office on my own this really does not appear suitable for fistula.  With this we will wait till she is closer to requiring a graft and schedule her for left upper extremity AV graft at that time.  She will not need a follow-up appointment prior to that.     Waynetta Sandy MD [facility]

## 2020-03-27 DIAGNOSIS — E875 Hyperkalemia: Secondary | ICD-10-CM | POA: Diagnosis not present

## 2020-03-27 DIAGNOSIS — N184 Chronic kidney disease, stage 4 (severe): Secondary | ICD-10-CM | POA: Diagnosis not present

## 2020-03-27 DIAGNOSIS — E1122 Type 2 diabetes mellitus with diabetic chronic kidney disease: Secondary | ICD-10-CM | POA: Diagnosis not present

## 2020-03-27 DIAGNOSIS — D631 Anemia in chronic kidney disease: Secondary | ICD-10-CM | POA: Diagnosis not present

## 2020-03-27 DIAGNOSIS — N189 Chronic kidney disease, unspecified: Secondary | ICD-10-CM | POA: Diagnosis not present

## 2020-03-27 DIAGNOSIS — N2581 Secondary hyperparathyroidism of renal origin: Secondary | ICD-10-CM | POA: Diagnosis not present

## 2020-03-27 DIAGNOSIS — R011 Cardiac murmur, unspecified: Secondary | ICD-10-CM | POA: Diagnosis not present

## 2020-05-02 DIAGNOSIS — E875 Hyperkalemia: Secondary | ICD-10-CM | POA: Diagnosis not present

## 2020-05-02 DIAGNOSIS — E1122 Type 2 diabetes mellitus with diabetic chronic kidney disease: Secondary | ICD-10-CM | POA: Diagnosis not present

## 2020-05-02 DIAGNOSIS — N184 Chronic kidney disease, stage 4 (severe): Secondary | ICD-10-CM | POA: Diagnosis not present

## 2020-05-02 DIAGNOSIS — N189 Chronic kidney disease, unspecified: Secondary | ICD-10-CM | POA: Diagnosis not present

## 2020-05-02 DIAGNOSIS — N2581 Secondary hyperparathyroidism of renal origin: Secondary | ICD-10-CM | POA: Diagnosis not present

## 2020-05-02 DIAGNOSIS — R011 Cardiac murmur, unspecified: Secondary | ICD-10-CM | POA: Diagnosis not present

## 2020-05-02 DIAGNOSIS — D631 Anemia in chronic kidney disease: Secondary | ICD-10-CM | POA: Diagnosis not present

## 2020-05-06 DIAGNOSIS — N184 Chronic kidney disease, stage 4 (severe): Secondary | ICD-10-CM | POA: Diagnosis not present

## 2020-05-09 ENCOUNTER — Other Ambulatory Visit: Payer: Self-pay

## 2020-05-09 DIAGNOSIS — R011 Cardiac murmur, unspecified: Secondary | ICD-10-CM | POA: Diagnosis not present

## 2020-05-09 DIAGNOSIS — N2581 Secondary hyperparathyroidism of renal origin: Secondary | ICD-10-CM | POA: Diagnosis not present

## 2020-05-09 DIAGNOSIS — E875 Hyperkalemia: Secondary | ICD-10-CM | POA: Diagnosis not present

## 2020-05-09 DIAGNOSIS — E1122 Type 2 diabetes mellitus with diabetic chronic kidney disease: Secondary | ICD-10-CM | POA: Diagnosis not present

## 2020-05-09 DIAGNOSIS — N189 Chronic kidney disease, unspecified: Secondary | ICD-10-CM | POA: Diagnosis not present

## 2020-05-09 DIAGNOSIS — N184 Chronic kidney disease, stage 4 (severe): Secondary | ICD-10-CM | POA: Diagnosis not present

## 2020-05-09 DIAGNOSIS — D631 Anemia in chronic kidney disease: Secondary | ICD-10-CM | POA: Diagnosis not present

## 2020-05-15 ENCOUNTER — Encounter: Payer: Self-pay | Admitting: Family Medicine

## 2020-05-15 ENCOUNTER — Telehealth: Payer: Self-pay | Admitting: Emergency Medicine

## 2020-05-15 ENCOUNTER — Other Ambulatory Visit: Payer: Self-pay

## 2020-05-15 ENCOUNTER — Ambulatory Visit (INDEPENDENT_AMBULATORY_CARE_PROVIDER_SITE_OTHER): Payer: Medicaid Other

## 2020-05-15 ENCOUNTER — Ambulatory Visit: Payer: Medicaid Other

## 2020-05-15 ENCOUNTER — Ambulatory Visit (INDEPENDENT_AMBULATORY_CARE_PROVIDER_SITE_OTHER): Payer: Medicaid Other | Admitting: Family Medicine

## 2020-05-15 VITALS — BP 133/72 | HR 69 | Temp 98.2°F | Ht 63.0 in | Wt 164.0 lb

## 2020-05-15 DIAGNOSIS — R109 Unspecified abdominal pain: Secondary | ICD-10-CM | POA: Diagnosis not present

## 2020-05-15 DIAGNOSIS — R1030 Lower abdominal pain, unspecified: Secondary | ICD-10-CM

## 2020-05-15 MED ORDER — POLYETHYLENE GLYCOL 3350 17 GM/SCOOP PO POWD: 17.0000 g | Freq: Two times a day (BID) | ORAL | 3 refills | Status: DC | PRN

## 2020-05-15 MED ORDER — BISACODYL 10 MG RE SUPP: 10.0000 mg | RECTAL | 0 refills | Status: DC | PRN

## 2020-05-15 MED ORDER — DOCUSATE SODIUM 100 MG PO CAPS: 100.0000 mg | ORAL_CAPSULE | Freq: Two times a day (BID) | ORAL | 3 refills | Status: DC

## 2020-05-15 NOTE — Telephone Encounter (Signed)
Received call from [place] at [facility]. Requesting recent labs, ekg, and notes for patient's appointment tomorrow. Fax to [phone]. Please advise at [phone]

## 2020-05-15 NOTE — Progress Notes (Signed)
12/15/20215:06 PM  Rachael Burke 12/31/1955, 64 y.o., female [id number]  Chief Complaint  Patient presents with  . Constipation    Constipation for 2 days , had an enema no relief, Patient is anemic and takes iron twice daily , sees nephrology and has water intake restrictions    HPI:   Patient is a 64 y.o. female with past medical history significant for HLD, DM, CKD, and anemia who presents today for constipation.  Declines interpreter  Abdominal pain and constipation Has not had a BM in days Takes Iron at home Mid abdominal pain radiates to back Last BM this morning had a few small balls This morning she did an enema with no results Made a home remedy drink this morning with minimal results  Negative Fecal occult blood 12/29/19     Depression screen [facility] 2/9 05/15/2020 05/11/2019 05/04/2019  Decreased Interest 0 0 0  Down, Depressed, Hopeless 0 0 0  PHQ - 2 Score 0 0 0  Altered sleeping - - -  Tired, decreased energy - - -  Change in appetite - - -  Feeling bad or failure about yourself  - - -  Trouble concentrating - - -  Moving slowly or fidgety/restless - - -  Suicidal thoughts - - -  PHQ-9 Score - - -    Fall Risk  05/15/2020 05/11/2019 05/04/2019 04/06/2019 01/04/2018  Falls in the past year? 1 0 0 0 Yes  Number falls in past yr: 0 - - - 1  Injury with Fall? 0 - - - No  Follow up Falls evaluation completed Falls evaluation completed Falls evaluation completed Falls evaluation completed -     No Known Allergies  Prior to Admission medications   Medication Sig Start Date End Date Taking? Authorizing Provider  atorvastatin (LIPITOR) 20 MG tablet Take 1 tablet (20 mg total) by mouth daily. 01/01/20   Charlynne Cousins, MD  furosemide (LASIX) 80 MG tablet Take 1 tablet (80 mg total) by mouth 2 (two) times daily. 01/01/20   Charlynne Cousins, MD  labetalol (NORMODYNE) 200 MG tablet Take 200 mg by mouth 2 (two) times daily. 02/01/20   [provider]  linagliptin (TRADJENTA) 5 MG TABS tablet Take 1 tablet (5 mg total) by mouth daily. 01/01/20   Charlynne Cousins, MD    Past Medical History:  Diagnosis Date  . Anemia   . Anxiety   . Diabetes mellitus without complication ([place])   . Hyperlipidemia   . Hypertension   . Renal disorder     Past Surgical History:  Procedure Laterality Date  . CESAREAN SECTION      Social History   Tobacco Use  . Smoking status: Never Smoker  . Smokeless tobacco: Never Used  Substance Use Topics  . Alcohol use: Never    Family History  Problem Relation Age of Onset  . Diabetes Mother   . Hypertension Father   . Diabetes Sister   . Diabetes Brother   . Diabetes Brother     Review of Systems  Constitutional: Negative for chills, fever and malaise/fatigue.  Eyes: Negative for blurred vision and double vision.  Respiratory: Negative for cough, shortness of breath and wheezing.   Cardiovascular: Negative for chest pain, palpitations and leg swelling.  Gastrointestinal: Positive for constipation and nausea. Negative for abdominal pain, blood in stool, diarrhea, heartburn, melena and vomiting.  Genitourinary: Negative for dysuria, flank pain, frequency, hematuria and urgency.  Musculoskeletal: Positive for back  pain. Negative for joint pain.  Skin: Negative for rash.  Neurological: Negative for dizziness, weakness and headaches.     OBJECTIVE:  Today's Vitals   05/15/20 1555  BP: 133/72  Pulse: 69  Temp: 98.2 F (36.8 C)  SpO2: 100%  Weight: 164 lb (74.4 kg)  Height: 5' 3"  (1.6 m)   Body mass index is 29.05 kg/m.   Physical Exam Constitutional:      General: She is not in acute distress.    Appearance: Normal appearance. She is not ill-appearing.  HENT:     Head: Normocephalic.  Cardiovascular:     Rate and Rhythm: Normal rate and regular rhythm.     Pulses: Normal pulses.     Heart sounds: Normal heart sounds. No murmur heard. No friction rub. No  gallop.   Pulmonary:     Effort: Pulmonary effort is normal. No respiratory distress.     Breath sounds: Normal breath sounds. No stridor. No wheezing, rhonchi or rales.  Abdominal:     General: Bowel sounds are decreased. There is distension.     Palpations: Abdomen is soft. There is no mass.     Tenderness: There is abdominal tenderness in the suprapubic area. There is no right CVA tenderness, left CVA tenderness, guarding or rebound.     Hernia: No hernia is present.  Musculoskeletal:     Thoracic back: Normal.     Lumbar back: Normal.     Right lower leg: No edema.     Left lower leg: No edema.  Skin:    General: Skin is warm and dry.     Coloration: Skin is not jaundiced.  Neurological:     Mental Status: She is alert and oriented to person, place, and time.  Psychiatric:        Mood and Affect: Mood normal.        Behavior: Behavior normal.      No results found for this or any previous visit (from the past 24 hour(s)).  DG Abd 2 Views  Result Date: 05/15/2020 CLINICAL DATA:  Lower abdominal pain EXAM: ABDOMEN - 2 VIEW COMPARISON:  Ultrasound 12/29/2019 FINDINGS: Visible lung bases are clear. No free air beneath the diaphragm. Nonobstructed bowel gas pattern with large amount of stool throughout the colon. No definite radiopaque calculi. Clips in the right upper quadrant IMPRESSION: Nonobstructed bowel gas pattern with large amount of stool in the colon. Electronically Signed   By: Donavan Foil M.D.   On: 05/15/2020 16:42      ASSESSMENT and PLAN  Problem List Items Addressed This Visit   None   Visit Diagnoses    Lower abdominal pain    -  Primary   Relevant Medications   docusate sodium (COLACE) 100 MG capsule   polyethylene glycol powder (GLYCOLAX/MIRALAX) 17 GM/SCOOP powder   bisacodyl (DULCOLAX) 10 MG suppository   Other Relevant Orders   POCT urinalysis dipstick   Microalbumin/Creatinine Ratio, Urine   CBC with Differential   CMP14+EGFR   Lipase    Amylase   DG Abd 2 Views (Completed)  R/se/b of medications discussed Will follow up with lab results RTC precautions provided      Return in about 4 weeks (around 06/12/2020).   Huston Foley Jasin Brazel, FNP-BC [facility] at [place] [facility] [employer], [place] [address] Ph.  [phone] Fax [phone]

## 2020-05-15 NOTE — Patient Instructions (Signed)

## 2020-05-15 NOTE — Telephone Encounter (Signed)
We will be able to send some information once the pt has been seen. She has not come in yet.

## 2020-05-16 ENCOUNTER — Telehealth: Payer: Self-pay

## 2020-05-16 ENCOUNTER — Telehealth: Payer: Self-pay | Admitting: Emergency Medicine

## 2020-05-16 ENCOUNTER — Ambulatory Visit: Payer: Self-pay | Admitting: Cardiology

## 2020-05-16 LAB — URINALYSIS, DIPSTICK ONLY
Bilirubin, UA: NEGATIVE
Ketones, UA: NEGATIVE
Leukocytes,UA: NEGATIVE
Nitrite, UA: NEGATIVE
RBC, UA: NEGATIVE
Specific Gravity, UA: 1.017 (ref 1.005–1.030)
Urobilinogen, Ur: 0.2 mg/dL (ref 0.2–1.0)
pH, UA: 5.5 (ref 5.0–7.5)

## 2020-05-16 LAB — CMP14+EGFR
ALT: 31 IU/L (ref 0–32)
AST: 30 IU/L (ref 0–40)
Albumin/Globulin Ratio: 1.3 (ref 1.2–2.2)
Albumin: 3.8 g/dL (ref 3.8–4.8)
Alkaline Phosphatase: 136 IU/L — ABNORMAL HIGH (ref 44–121)
BUN/Creatinine Ratio: 13 (ref 12–28)
BUN: 76 mg/dL (ref 8–27)
Bilirubin Total: 0.2 mg/dL (ref 0.0–1.2)
CO2: 16 mmol/L — ABNORMAL LOW (ref 20–29)
Calcium: 8.2 mg/dL — ABNORMAL LOW (ref 8.7–10.3)
Chloride: 103 mmol/L (ref 96–106)
Creatinine, Ser: 6.07 mg/dL (ref 0.57–1.00)
GFR calc Af Amer: 8 mL/min/{1.73_m2} — ABNORMAL LOW (ref >59)
GFR calc non Af Amer: 7 mL/min/{1.73_m2} — ABNORMAL LOW (ref >59)
Globulin, Total: 3 g/dL (ref 1.5–4.5)
Glucose: 177 mg/dL — ABNORMAL HIGH (ref 65–99)
Potassium: 6.2 mmol/L — ABNORMAL HIGH (ref 3.5–5.2)
Sodium: 138 mmol/L (ref 134–144)
Total Protein: 6.8 g/dL (ref 6.0–8.5)

## 2020-05-16 LAB — LIPASE: Lipase: 40 U/L (ref 14–72)

## 2020-05-16 LAB — CBC WITH DIFFERENTIAL/PLATELET
Basophils Absolute: 0 10*3/uL (ref 0.0–0.2)
Basos: 0 %
EOS (ABSOLUTE): 0.1 10*3/uL (ref 0.0–0.4)
Eos: 2 %
Hematocrit: 26.4 % — ABNORMAL LOW (ref 34.0–46.6)
Hemoglobin: 8.1 g/dL — ABNORMAL LOW (ref 11.1–15.9)
Immature Grans (Abs): 0 10*3/uL (ref 0.0–0.1)
Immature Granulocytes: 0 %
Lymphocytes Absolute: 0.9 10*3/uL (ref 0.7–3.1)
Lymphs: 11 %
MCH: 28.1 pg (ref 26.6–33.0)
MCHC: 30.7 g/dL — ABNORMAL LOW (ref 31.5–35.7)
MCV: 92 fL (ref 79–97)
Monocytes Absolute: 0.4 10*3/uL (ref 0.1–0.9)
Monocytes: 5 %
Neutrophils Absolute: 6.6 10*3/uL (ref 1.4–7.0)
Neutrophils: 82 %
Platelets: 188 10*3/uL (ref 150–450)
RBC: 2.88 x10E6/uL — ABNORMAL LOW (ref 3.77–5.28)
RDW: 13.6 % (ref 11.7–15.4)
WBC: 8.1 10*3/uL (ref 3.4–10.8)

## 2020-05-16 LAB — MICROALBUMIN / CREATININE URINE RATIO
Creatinine, Urine: 106.8 mg/dL
Microalb/Creat Ratio: 2546 mg/g creat — ABNORMAL HIGH (ref 0–29)
Microalbumin, Urine: 2719.1 ug/mL

## 2020-05-16 LAB — AMYLASE: Amylase: 58 U/L (ref 31–110)

## 2020-05-16 NOTE — Telephone Encounter (Signed)
[place] called, patient's labs are worsening and Dr. Justin Mend would like her to be on HD prior to Christmas. Moved up her surgery with Dr. Donzetta Matters on 06/04/20 to 05/20/20 with Dr. Carlis Abbott and added on a [facility].

## 2020-05-16 NOTE — Telephone Encounter (Signed)
Received a fax from after hours about a critical lab result. Candicie called with results [phone]  BUN 76 (8-27) Creatine 6.07 (0.57-1.00)   Please advise.

## 2020-05-16 NOTE — Telephone Encounter (Signed)
Provider has spoke with patient this morning per lab notes and recommendations.

## 2020-05-17 ENCOUNTER — Other Ambulatory Visit: Payer: Self-pay

## 2020-05-17 ENCOUNTER — Other Ambulatory Visit (HOSPITAL_COMMUNITY)
Admission: RE | Admit: 2020-05-17 | Discharge: 2020-05-17 | Disposition: A | Discharge status: Home / Self Care | Payer: Medicaid Other | Source: Ambulatory Visit | Attending: Vascular Surgery | Admitting: Vascular Surgery

## 2020-05-17 ENCOUNTER — Encounter (HOSPITAL_COMMUNITY): Payer: Self-pay | Admitting: Vascular Surgery

## 2020-05-17 DIAGNOSIS — Z20822 Contact with and (suspected) exposure to covid-19: Secondary | ICD-10-CM | POA: Diagnosis not present

## 2020-05-17 DIAGNOSIS — Z01812 Encounter for preprocedural laboratory examination: Secondary | ICD-10-CM | POA: Diagnosis not present

## 2020-05-17 LAB — SARS CORONAVIRUS 2 (TAT 6-24 HRS): SARS Coronavirus 2: NEGATIVE

## 2020-05-17 NOTE — Progress Notes (Signed)
Spoke with pt's daughter Mable Fill for pre-op call via [employer] ([employer] Katie. DPR on file. Marissa states pt does not have a cardiac history. She is a type 2 diabetic. Last A1C 6.7 on 12/29/19. Marissa states pt's fasting blood sugar is usually between 120-150. Instructed Marissa not to have pt take her Tradjenta the morning surgery.   Covid test done today, results pending.  Mable Fill states pt has been in quarantine since the test was done and understands that she stays in quarantine until she comes to the hospital on Monday.

## 2020-05-19 NOTE — Anesthesia Preprocedure Evaluation (Addendum)
Anesthesia Evaluation  Patient identified by MRN, date of birth, ID band Patient awake    Reviewed: Allergy & Precautions, H&P , NPO status , Patient's Chart, lab work & pertinent test results, reviewed documented beta blocker date and time   History of Anesthesia Complications (+) PONV  Airway Mallampati: II  TM Distance: >3 FB Neck ROM: Full    Dental no notable dental hx. (+) Upper Dentures, Lower Dentures, Dental Advisory Given   Pulmonary neg pulmonary ROS,    Pulmonary exam normal breath sounds clear to auscultation       Cardiovascular hypertension, Pt. on medications and Pt. on home beta blockers  Rhythm:Regular Rate:Normal     Neuro/Psych Anxiety negative neurological ROS     GI/Hepatic negative GI ROS, Neg liver ROS,   Endo/Other  diabetes, Type 2, Oral Hypoglycemic Agents  Renal/GU DialysisRenal diseaseK+ 6.2  negative genitourinary   Musculoskeletal  (+) Arthritis , Osteoarthritis,    Abdominal   Peds  Hematology  (+) Blood dyscrasia, anemia , Hgb 8.1   Anesthesia Other Findings   Reproductive/Obstetrics negative OB ROS                           Anesthesia Physical Anesthesia Plan  ASA: III  Anesthesia Plan: Regional and MAC   Post-op Pain Management:    Induction:   PONV Risk Score and Plan: 3 and Treatment may vary due to age or medical condition, Ondansetron, Dexamethasone, Propofol infusion and Midazolam  Airway Management Planned: Natural Airway and Simple Face Mask  Additional Equipment: None  Intra-op Plan:   Post-operative Plan:   Informed Consent: I have reviewed the patients History and Physical, chart, labs and discussed the procedure including the risks, benefits and alternatives for the proposed anesthesia with the patient or authorized representative who has indicated his/her understanding and acceptance.     Dental advisory given  Plan  Discussed with: CRNA  Anesthesia Plan Comments: (Mac w L supraclavicular N Block)       Anesthesia Quick Evaluation

## 2020-05-20 ENCOUNTER — Ambulatory Visit (HOSPITAL_COMMUNITY): Payer: Medicaid Other | Admitting: Vascular Surgery

## 2020-05-20 ENCOUNTER — Other Ambulatory Visit: Payer: Self-pay

## 2020-05-20 ENCOUNTER — Ambulatory Visit (HOSPITAL_COMMUNITY)
Admission: RE | Admit: 2020-05-20 | Discharge: 2020-05-20 | Disposition: A | Discharge status: Home / Self Care | Payer: Medicaid Other | Attending: Vascular Surgery | Admitting: Vascular Surgery

## 2020-05-20 ENCOUNTER — Ambulatory Visit (HOSPITAL_COMMUNITY): Payer: Medicaid Other

## 2020-05-20 ENCOUNTER — Encounter (HOSPITAL_COMMUNITY)
Admission: RE | Disposition: A | Discharge status: Home / Self Care | Payer: Self-pay | Source: Home / Self Care | Attending: Vascular Surgery

## 2020-05-20 ENCOUNTER — Inpatient Hospital Stay (HOSPITAL_COMMUNITY): Admission: RE | Admit: 2020-05-20 | Payer: Self-pay | Source: Ambulatory Visit

## 2020-05-20 ENCOUNTER — Encounter (HOSPITAL_COMMUNITY): Payer: Self-pay | Admitting: Vascular Surgery

## 2020-05-20 DIAGNOSIS — N186 End stage renal disease: Secondary | ICD-10-CM

## 2020-05-20 DIAGNOSIS — I120 Hypertensive chronic kidney disease with stage 5 chronic kidney disease or end stage renal disease: Secondary | ICD-10-CM | POA: Insufficient documentation

## 2020-05-20 DIAGNOSIS — E1122 Type 2 diabetes mellitus with diabetic chronic kidney disease: Secondary | ICD-10-CM | POA: Diagnosis not present

## 2020-05-20 DIAGNOSIS — Z7984 Long term (current) use of oral hypoglycemic drugs: Secondary | ICD-10-CM | POA: Insufficient documentation

## 2020-05-20 DIAGNOSIS — Z833 Family history of diabetes mellitus: Secondary | ICD-10-CM | POA: Insufficient documentation

## 2020-05-20 DIAGNOSIS — N179 Acute kidney failure, unspecified: Secondary | ICD-10-CM | POA: Diagnosis not present

## 2020-05-20 DIAGNOSIS — I517 Cardiomegaly: Secondary | ICD-10-CM | POA: Diagnosis not present

## 2020-05-20 DIAGNOSIS — J9811 Atelectasis: Secondary | ICD-10-CM | POA: Diagnosis not present

## 2020-05-20 DIAGNOSIS — Z79899 Other long term (current) drug therapy: Secondary | ICD-10-CM | POA: Diagnosis not present

## 2020-05-20 DIAGNOSIS — Z8249 Family history of ischemic heart disease and other diseases of the circulatory system: Secondary | ICD-10-CM | POA: Diagnosis not present

## 2020-05-20 DIAGNOSIS — N185 Chronic kidney disease, stage 5: Secondary | ICD-10-CM | POA: Diagnosis not present

## 2020-05-20 DIAGNOSIS — Z95828 Presence of other vascular implants and grafts: Secondary | ICD-10-CM

## 2020-05-20 HISTORY — PX: INSERTION OF DIALYSIS CATHETER: SHX1324

## 2020-05-20 HISTORY — DX: Unspecified osteoarthritis, unspecified site: M19.90

## 2020-05-20 HISTORY — DX: Pneumonia, unspecified organism: J18.9

## 2020-05-20 HISTORY — DX: Nausea with vomiting, unspecified: Z98.890

## 2020-05-20 HISTORY — DX: Other specified postprocedural states: R11.2

## 2020-05-20 HISTORY — PX: AV FISTULA PLACEMENT: SHX1204

## 2020-05-20 LAB — POCT I-STAT, CHEM 8
BUN: 64 mg/dL — ABNORMAL HIGH (ref 8–23)
Calcium, Ion: 1.01 mmol/L — ABNORMAL LOW (ref 1.15–1.40)
Chloride: 110 mmol/L (ref 98–111)
Creatinine, Ser: 5.6 mg/dL — ABNORMAL HIGH (ref 0.44–1.00)
Glucose, Bld: 111 mg/dL — ABNORMAL HIGH (ref 70–99)
HCT: 28 % — ABNORMAL LOW (ref 36.0–46.0)
Hemoglobin: 9.5 g/dL — ABNORMAL LOW (ref 12.0–15.0)
Potassium: 3.6 mmol/L (ref 3.5–5.1)
Sodium: 144 mmol/L (ref 135–145)
TCO2: 22 mmol/L (ref 22–32)

## 2020-05-20 LAB — GLUCOSE, CAPILLARY
Glucose-Capillary: 112 mg/dL — ABNORMAL HIGH (ref 70–99)
Glucose-Capillary: 126 mg/dL — ABNORMAL HIGH (ref 70–99)

## 2020-05-20 SURGERY — INSERTION OF ARTERIOVENOUS (AV) GORE-TEX GRAFT ARM
Anesthesia: Monitor Anesthesia Care

## 2020-05-20 MED ORDER — MIDAZOLAM HCL 2 MG/2ML IJ SOLN
INTRAMUSCULAR | Status: DC | PRN
  Administered 2020-05-20: 1 mg via INTRAVENOUS

## 2020-05-20 MED ORDER — LIDOCAINE HCL (PF) 1 % IJ SOLN
INTRAMUSCULAR | Status: AC
  Filled 2020-05-20: qty 30

## 2020-05-20 MED ORDER — ONDANSETRON HCL 4 MG/2ML IJ SOLN
INTRAMUSCULAR | Status: DC | PRN
  Administered 2020-05-20: 4 mg via INTRAVENOUS

## 2020-05-20 MED ORDER — LACTATED RINGERS IV SOLN: INTRAVENOUS | Status: DC

## 2020-05-20 MED ORDER — FENTANYL CITRATE (PF) 100 MCG/2ML IJ SOLN: 25.0000 ug | INTRAMUSCULAR | Status: DC | PRN

## 2020-05-20 MED ORDER — PROPOFOL 500 MG/50ML IV EMUL
INTRAVENOUS | Status: DC | PRN
  Administered 2020-05-20: 100 ug/kg/min via INTRAVENOUS

## 2020-05-20 MED ORDER — CHLORHEXIDINE GLUCONATE 0.12 % MT SOLN
15.0000 mL | Freq: Once | OROMUCOSAL | Status: AC
  Administered 2020-05-20: 09:00:00 15 mL via OROMUCOSAL
  Filled 2020-05-20: qty 15

## 2020-05-20 MED ORDER — MIDAZOLAM HCL 2 MG/2ML IJ SOLN
INTRAMUSCULAR | Status: AC
  Filled 2020-05-20: qty 2

## 2020-05-20 MED ORDER — SODIUM CHLORIDE 0.9 % IV SOLN
INTRAVENOUS | Status: AC
  Filled 2020-05-20: qty 1.2

## 2020-05-20 MED ORDER — OXYCODONE-ACETAMINOPHEN 5-325 MG PO TABS: 1.0000 | ORAL_TABLET | ORAL | 0 refills | Status: DC | PRN

## 2020-05-20 MED ORDER — FENTANYL CITRATE (PF) 100 MCG/2ML IJ SOLN
INTRAMUSCULAR | Status: AC
  Administered 2020-05-20: 09:00:00 50 ug via INTRAVENOUS
  Filled 2020-05-20: qty 2

## 2020-05-20 MED ORDER — MIDAZOLAM HCL 2 MG/2ML IJ SOLN: 1.0000 mg | Freq: Once | INTRAMUSCULAR | Status: AC

## 2020-05-20 MED ORDER — CHLORHEXIDINE GLUCONATE 4 % EX LIQD: 60.0000 mL | Freq: Once | CUTANEOUS | Status: DC

## 2020-05-20 MED ORDER — PHENYLEPHRINE 40 MCG/ML (10ML) SYRINGE FOR IV PUSH (FOR BLOOD PRESSURE SUPPORT)
PREFILLED_SYRINGE | INTRAVENOUS | Status: AC
  Filled 2020-05-20: qty 10

## 2020-05-20 MED ORDER — LIDOCAINE HCL 1 % IJ SOLN
INTRAMUSCULAR | Status: DC | PRN
  Administered 2020-05-20: 10 mL

## 2020-05-20 MED ORDER — HEPARIN SODIUM (PORCINE) 1000 UNIT/ML IJ SOLN
INTRAMUSCULAR | Status: DC | PRN
  Administered 2020-05-20: 3000 [IU] via INTRAVENOUS

## 2020-05-20 MED ORDER — CEFAZOLIN SODIUM-DEXTROSE 2-4 GM/100ML-% IV SOLN
2.0000 g | INTRAVENOUS | Status: AC
  Administered 2020-05-20: 10:00:00 2 g via INTRAVENOUS
  Filled 2020-05-20: qty 100

## 2020-05-20 MED ORDER — PHENYLEPHRINE 40 MCG/ML (10ML) SYRINGE FOR IV PUSH (FOR BLOOD PRESSURE SUPPORT)
PREFILLED_SYRINGE | INTRAVENOUS | Status: DC | PRN
  Administered 2020-05-20 (×4): 80 ug via INTRAVENOUS

## 2020-05-20 MED ORDER — PHENYLEPHRINE HCL-NACL 10-0.9 MG/250ML-% IV SOLN: INTRAVENOUS | Status: DC | PRN

## 2020-05-20 MED ORDER — HEPARIN SODIUM (PORCINE) 1000 UNIT/ML IJ SOLN
INTRAMUSCULAR | Status: AC
  Filled 2020-05-20: qty 1

## 2020-05-20 MED ORDER — LIDOCAINE 2% (20 MG/ML) 5 ML SYRINGE
INTRAMUSCULAR | Status: DC | PRN
  Administered 2020-05-20: 50 mg via INTRAVENOUS

## 2020-05-20 MED ORDER — EPHEDRINE SULFATE-NACL 50-0.9 MG/10ML-% IV SOSY
PREFILLED_SYRINGE | INTRAVENOUS | Status: DC | PRN
  Administered 2020-05-20: 5 mg via INTRAVENOUS
  Administered 2020-05-20: 10 mg via INTRAVENOUS
  Administered 2020-05-20: 5 mg via INTRAVENOUS
  Administered 2020-05-20: 10 mg via INTRAVENOUS

## 2020-05-20 MED ORDER — FENTANYL CITRATE (PF) 100 MCG/2ML IJ SOLN: 50.0000 ug | Freq: Once | INTRAMUSCULAR | Status: AC

## 2020-05-20 MED ORDER — HEPARIN SODIUM (PORCINE) 1000 UNIT/ML IJ SOLN
INTRAMUSCULAR | Status: DC | PRN
  Administered 2020-05-20: 1000 [IU]

## 2020-05-20 MED ORDER — FENTANYL CITRATE (PF) 250 MCG/5ML IJ SOLN
INTRAMUSCULAR | Status: AC
  Filled 2020-05-20: qty 5

## 2020-05-20 MED ORDER — SODIUM CHLORIDE 0.9 % IV SOLN: INTRAVENOUS | Status: DC | PRN

## 2020-05-20 MED ORDER — ORAL CARE MOUTH RINSE: 15.0000 mL | Freq: Once | OROMUCOSAL | Status: AC

## 2020-05-20 MED ORDER — MIDAZOLAM HCL 2 MG/2ML IJ SOLN
INTRAMUSCULAR | Status: AC
  Administered 2020-05-20: 09:00:00 1 mg via INTRAVENOUS
  Filled 2020-05-20: qty 2

## 2020-05-20 MED ORDER — 0.9 % SODIUM CHLORIDE (POUR BTL) OPTIME
TOPICAL | Status: DC | PRN
  Administered 2020-05-20: 10:00:00 1000 mL

## 2020-05-20 MED ORDER — LIDOCAINE-EPINEPHRINE (PF) 1.5 %-1:200000 IJ SOLN
INTRAMUSCULAR | Status: DC | PRN
  Administered 2020-05-20: 30 mL via PERINEURAL

## 2020-05-20 MED ORDER — DEXAMETHASONE SODIUM PHOSPHATE 10 MG/ML IJ SOLN
INTRAMUSCULAR | Status: DC | PRN
  Administered 2020-05-20: 5 mg via INTRAVENOUS

## 2020-05-20 MED ORDER — ACETAMINOPHEN 500 MG PO TABS
1000.0000 mg | ORAL_TABLET | Freq: Once | ORAL | Status: AC
  Administered 2020-05-20: 09:00:00 1000 mg via ORAL
  Filled 2020-05-20: qty 2

## 2020-05-20 MED ORDER — SODIUM CHLORIDE 0.9 % IV SOLN: INTRAVENOUS | Status: DC

## 2020-05-20 MED ORDER — LIDOCAINE 2% (20 MG/ML) 5 ML SYRINGE
INTRAMUSCULAR | Status: AC
  Filled 2020-05-20: qty 5

## 2020-05-20 MED ORDER — BUPIVACAINE HCL (PF) 0.25 % IJ SOLN
INTRAMUSCULAR | Status: DC | PRN
  Administered 2020-05-20: 10 mL

## 2020-05-20 SURGICAL SUPPLY — 67 items
ARMBAND PINK RESTRICT EXTREMIT (MISCELLANEOUS) ×4 IMPLANT
BAG DECANTER FOR FLEXI CONT (MISCELLANEOUS) ×4 IMPLANT
BIOPATCH RED 1 DISK 7.0 (GAUZE/BANDAGES/DRESSINGS) ×3 IMPLANT
BIOPATCH RED 1IN DISK 7.0MM (GAUZE/BANDAGES/DRESSINGS) ×1
BLADE CLIPPER SURG (BLADE) IMPLANT
CANISTER SUCT 3000ML PPV (MISCELLANEOUS) ×4 IMPLANT
CATH PALINDROME-P 19CM W/VT (CATHETERS) IMPLANT
CATH PALINDROME-P 23CM W/VT (CATHETERS) ×4 IMPLANT
CATH PALINDROME-P 28CM W/VT (CATHETERS) IMPLANT
CATH STRAIGHT 5FR 65CM (CATHETERS) IMPLANT
CLIP VESOCCLUDE MED 6/CT (CLIP) ×4 IMPLANT
CLIP VESOCCLUDE SM WIDE 6/CT (CLIP) ×4 IMPLANT
COVER DOME SNAP 22 D (MISCELLANEOUS) ×4 IMPLANT
COVER PROBE W GEL 5X96 (DRAPES) ×4 IMPLANT
COVER SURGICAL LIGHT HANDLE (MISCELLANEOUS) ×4 IMPLANT
COVER WAND RF STERILE (DRAPES) ×4 IMPLANT
DECANTER SPIKE VIAL GLASS SM (MISCELLANEOUS) ×4 IMPLANT
DERMABOND ADVANCED (GAUZE/BANDAGES/DRESSINGS) ×2
DERMABOND ADVANCED .7 DNX12 (GAUZE/BANDAGES/DRESSINGS) ×2 IMPLANT
DRAPE C-ARM 42X72 X-RAY (DRAPES) IMPLANT
DRAPE CHEST BREAST 15X10 FENES (DRAPES) IMPLANT
DRSG COVADERM 4X6 (GAUZE/BANDAGES/DRESSINGS) ×4 IMPLANT
ELECT REM PT RETURN 9FT ADLT (ELECTROSURGICAL) ×4
ELECTRODE REM PT RTRN 9FT ADLT (ELECTROSURGICAL) ×2 IMPLANT
GAUZE 4X4 16PLY RFD (DISPOSABLE) ×4 IMPLANT
GAUZE SPONGE 4X4 12PLY STRL LF (GAUZE/BANDAGES/DRESSINGS) ×4 IMPLANT
GLOVE BIO SURGEON STRL SZ 6.5 (GLOVE) ×21 IMPLANT
GLOVE BIO SURGEON STRL SZ7.5 (GLOVE) ×4 IMPLANT
GLOVE BIO SURGEONS STRL SZ 6.5 (GLOVE) ×7
GLOVE BIOGEL PI IND STRL 7.5 (GLOVE) ×2 IMPLANT
GLOVE BIOGEL PI IND STRL 8 (GLOVE) ×2 IMPLANT
GLOVE BIOGEL PI INDICATOR 7.5 (GLOVE) ×2
GLOVE BIOGEL PI INDICATOR 8 (GLOVE) ×2
GOWN STRL REUS W/ TWL LRG LVL3 (GOWN DISPOSABLE) ×14 IMPLANT
GOWN STRL REUS W/ TWL XL LVL3 (GOWN DISPOSABLE) ×4 IMPLANT
GOWN STRL REUS W/TWL LRG LVL3 (GOWN DISPOSABLE) ×14
GOWN STRL REUS W/TWL XL LVL3 (GOWN DISPOSABLE) ×4
GRAFT GORETEX STRT 4-7X45 (Vascular Products) IMPLANT
HEMOSTAT SPONGE AVITENE ULTRA (HEMOSTASIS) IMPLANT
KIT BASIN OR (CUSTOM PROCEDURE TRAY) ×4 IMPLANT
KIT PALINDROME-P 55CM (CATHETERS) IMPLANT
KIT TURNOVER KIT B (KITS) ×4 IMPLANT
NEEDLE 18GX1X1/2 (RX/OR ONLY) (NEEDLE) ×4 IMPLANT
NEEDLE HYPO 25GX1X1/2 BEV (NEEDLE) ×4 IMPLANT
NS IRRIG 1000ML POUR BTL (IV SOLUTION) ×4 IMPLANT
PACK CV ACCESS (CUSTOM PROCEDURE TRAY) ×4 IMPLANT
PACK SURGICAL SETUP 50X90 (CUSTOM PROCEDURE TRAY) IMPLANT
PAD ARMBOARD 7.5X6 YLW CONV (MISCELLANEOUS) ×8 IMPLANT
SET MICROPUNCTURE 5F STIFF (MISCELLANEOUS) IMPLANT
SOAP 2 % CHG 4 OZ (WOUND CARE) ×4 IMPLANT
SUT ETHILON 3 0 PS 1 (SUTURE) ×4 IMPLANT
SUT GORETEX 6.0 TT13 (SUTURE) IMPLANT
SUT MNCRL AB 4-0 PS2 18 (SUTURE) ×8 IMPLANT
SUT PROLENE 6 0 BV (SUTURE) ×8 IMPLANT
SUT PROLENE 7 0 BV 1 (SUTURE) IMPLANT
SUT SILK 2 0 PERMA HAND 18 BK (SUTURE) IMPLANT
SUT VIC AB 3-0 SH 27 (SUTURE) ×4
SUT VIC AB 3-0 SH 27X BRD (SUTURE) ×4 IMPLANT
SYR 10ML LL (SYRINGE) ×4 IMPLANT
SYR 20ML LL LF (SYRINGE) ×8 IMPLANT
SYR 5ML LL (SYRINGE) ×4 IMPLANT
SYR CONTROL 10ML LL (SYRINGE) ×4 IMPLANT
TOWEL GREEN STERILE (TOWEL DISPOSABLE) ×4 IMPLANT
TOWEL GREEN STERILE FF (TOWEL DISPOSABLE) ×4 IMPLANT
UNDERPAD 30X36 HEAVY ABSORB (UNDERPADS AND DIAPERS) ×4 IMPLANT
WATER STERILE IRR 1000ML POUR (IV SOLUTION) ×4 IMPLANT
WIRE AMPLATZ SS-J .035X180CM (WIRE) IMPLANT

## 2020-05-20 NOTE — Op Note (Signed)
OPERATIVE NOTE   PROCEDURE: 1.  Ultrasound-guided access right internal jugular vein 2.  Right internal jugular vein tunneled dialysis catheter (23 cm palindrome) 3.  left first stage basilic vein transposition (brachiobasilic arteriovenous fistula) placement  PRE-OPERATIVE DIAGNOSIS: Stage 5 CKD, impending need for HD  POST-OPERATIVE DIAGNOSIS: same  SURGEON: Monica Martinez, MD  ASSISTANT(S): Paulo Fruit, PA  ANESTHESIA: MAC  ESTIMATED BLOOD LOSS: Minimal  FINDING(S): Right internal jugular vein tunneled palindrome catheter was placed with the tip of the catheter in the right atrium and the catheter aspirated and flushed easily.  Vein mapping in the left arm showed no usable surface veins with plan for a left upper arm AV graft.  When we evaluated her arm with ultrasound in the OR we found a left basilic vein that was usable greater than 2.5 to 3 mm.  After anastomosis to the artery at the antecubitum there was an excellent thrill.  SPECIMEN(S):  none  INDICATIONS:   Rachael Burke is a 64 y.o. female who presents with stage 5 CKD.  The patient is scheduled for tunneled dialysis catheter placement and left arm access.  Her surgery date has been moved up at the request of nephrology given impending need for dialysis.  Risk benefits were discussed with her through the interpreter.  An assistant was needed for exposure and to expedite the case.   DESCRIPTION: After full informed written consent was obtained from the patient, the patient was brought back to the operating room and placed supine upon the operating table.  Prior to induction, the patient received IV antibiotics.   After obtaining adequate anesthesia, the patient was then prepped and draped in the standard fashion.  Initially used ultrasound to evaluate the right internal jugular vein this was patent and image was saved.  I then accessed this under ultrasound guidance with an 18-gauge needle placed a  J-wire into the right atrium.  I then measured a 23 cm palindrome catheter on her chest wall.  Counterincision was made over the right chest wall and I then tunneled from the counterincision to the right IJ stick site.  Through this we then tunneled a 23 cm single piece palindrome catheter.  I then used the J-wire that had been placed in the right IJ into the right atrium and dilated over this sequentially and then placed a large dilator peel-away sheath.  I then was able to place the tip of the tunneled catheter into the peel-away sheath and this was buried into the right atrium and the sheath was peeled away.  I then positioned the catheter under fluoroscopy.  This flushed and aspirated easily.  The catheter was secured in multiple places with 3-0 silk sutures.  The neck incision was closed with a 4-0 Monocryl subcuticular Dermabond was applied catheter was loaded with heparinized saline  We then turned our attention to the left arm.  I had planned to place an AV graft but after evaluation with ultrasound she had a usable basilic vein in the left arm.  Using SonoSite guidance, the location of these vessels were marked out on the skin.   I made a transverse incision at the level of the antecubitum and dissected through the subcutaneous tissue and fascia to gain exposure of the brachial artery.  This was noted to be 3.5 mm in diameter externally.  This was dissected out proximally and distally and controlled with vessel loops .  I then dissected out the basilic vein.  This was noted  to be 2.5 - 3 mm in diameter externally.  The patient ws given 3000 units IV heparin.  The distal segment of the vein was ligated with a  2-0 silk, and the vein was transected.  The proximal segment was interrogated with serial dilators.  The vein accepted up to a 4 mm dilator without any difficulty.  I then instilled the heparinized saline into the vein and clamped it.  At this point, I reset my exposure of the brachial artery and  placed the artery under tension proximally and distally.  I made an arteriotomy with a #11 blade, and then I extended the arteriotomy with a Potts scissor.  I injected heparinized saline proximal and distal to this arteriotomy.  The vein was then sewn to the artery in an end-to-side configuration with a running stitch of 6-0 Prolene.  Prior to completing this anastomosis, I allowed the vein and artery to backbleed.  There was no evidence of clot from any vessels.  I completed the anastomosis in the usual fashion and then released all vessel loops and clamps.    There was a palpable thrill in the venous outflow, and there was a palpable radial pulse.  At this point, I irrigated out the surgical wound.  There was no further active bleeding.  The subcutaneous tissue was reapproximated with a running stitch of 3-0 Vicryl.  The skin was then reapproximated with a running subcuticular stitch of 4-0 Monocryl.  The skin was then cleaned, dried, and reinforced with Dermabond.  The patient tolerated this procedure well.   COMPLICATIONS: None  CONDITION: Stable  Marty Heck MD [facility] Office: [place]   05/20/2020, 11:51 AM

## 2020-05-20 NOTE — Discharge Instructions (Addendum)
[facility]  Discharge Instructions  AV Fistula or Graft Surgery for Dialysis Access  Please refer to the following instructions for your post-procedure care. Your surgeon or physician assistant will discuss any changes with you.  Activity  You may drive the day following your surgery, if you are comfortable and no longer taking prescription pain medication. Resume full activity as the soreness in your incision resolves.  Bathing/Showering  You may shower after you go home. Keep your incision dry for 48 hours. Do not soak in a bathtub, hot tub, or swim until the incision heals completely. You may not shower if you have a hemodialysis catheter.  Incision Care  Clean your incision with mild soap and water after 48 hours. Pat the area dry with a clean towel. You do not need a bandage unless otherwise instructed. Do not apply any ointments or creams to your incision. You may have skin glue on your incision. Do not peel it off. It will come off on its own in about one week. Your arm may swell a bit after surgery. To reduce swelling use pillows to elevate your arm so it is above your heart. Your doctor will tell you if you need to lightly wrap your arm with an ACE bandage.  Diet  Resume your normal diet. There are not special food restrictions following this procedure. In order to heal from your surgery, it is CRITICAL to get adequate nutrition. Your body requires vitamins, minerals, and protein. Vegetables are the best source of vitamins and minerals. Vegetables also provide the perfect balance of protein. Processed food has little nutritional value, so try to avoid this.  Medications  Resume taking all of your medications. If your incision is causing pain, you may take over-the counter pain relievers such as acetaminophen (Tylenol). If you were prescribed a stronger pain medication, please be aware these medications can cause nausea and constipation. Prevent  nausea by taking the medication with a snack or meal. Avoid constipation by drinking plenty of fluids and eating foods with high amount of fiber, such as fruits, vegetables, and grains.   Do not take Tylenol if you are taking prescription pain medications.  Follow up Your surgeon may want to see you in the office following your access surgery. If so, this will be arranged at the time of your surgery.  Please call us immediately for any of the following conditions:   Increased pain, redness, drainage (pus) from your incision site  Fever of 101 degrees or higher  Severe or worsening pain at your incision site  Hand pain or numbness.   Reduce your risk of vascular disease:   Stop smoking. If you would like help, call [facility] at 1-800-QUIT-NOW 7094520031) or [place] at [place] your cholesterol  Maintain a desired weight  Control your diabetes  Keep your blood pressure down  Dialysis  It will take several weeks to several months for your new dialysis access to be ready for use. Your surgeon will determine when it is okay to use it. Your nephrologist will continue to direct your dialysis. You can continue to use your Permcath until your new access is ready for use.   05/20/2020 Rachael Burke [id number] 09/14/1955  Surgeon(s): Marty Heck, MD  Procedure(s): INSERTION OF LEFT ARM ARTERIOVENOUS (AV) FISTULA INSERTION OF DIALYSIS CATHETER   May stick graft immediately   May stick graft on designated area only:   X Do not stick Left  AV fistula for 12 weeks    If you have any questions, please call the office at [phone].

## 2020-05-20 NOTE — Transfer of Care (Signed)
Immediate Anesthesia Transfer of Care Note  Patient: Mariaisabel Bodiford de Arriola  Procedure(s) Performed: INSERTION OF LEFT ARM ARTERIOVENOUS (AV) FISTULA (Left ) INSERTION OF DIALYSIS CATHETER (N/A )  Patient Location: PACU  Anesthesia Type:MAC  Level of Consciousness: drowsy and patient cooperative  Airway & Oxygen Therapy: Patient Spontanous Breathing  Post-op Assessment: Report given to RN and Post -op Vital signs reviewed and stable  Post vital signs: Reviewed and stable  Last Vitals:  Vitals Value Taken Time  BP 144/63 05/20/20 1126  Temp    Pulse 58 05/20/20 1126  Resp 13 05/20/20 1126  SpO2 94 % 05/20/20 1126  Vitals shown include unvalidated device data.  Last Pain:  Vitals:   05/20/20 0825  TempSrc:   PainSc: 2       Patients Stated Pain Goal: 5 (74/12/87 8676)  Complications: No complications documented.

## 2020-05-20 NOTE — H&P (Signed)
History and Physical Interval Note:  05/20/2020 8:52 AM  Rachael Burke  has presented today for surgery, with the diagnosis of CHRONIC KIDNEY DISEASE STAGE IV.  The various methods of treatment have been discussed with the patient and family. After consideration of risks, benefits and other options for treatment, the patient has consented to  Procedure(s): INSERTION OF LEFT ARM ARTERIOVENOUS (AV) GORE-TEX GRAFT (Left) INSERTION OF DIALYSIS CATHETER (N/A) as a surgical intervention.  The patient's history has been reviewed, patient examined, no change in status, stable for surgery.  I have reviewed the patient's chart and labs.  Questions were answered to the patient's satisfaction.    Discussed plan for tunneled dialysis catheter and left arm AV graft with interpreter.  Patient has no surface veins on vein mapping and hence plan for AV graft.  Her surgery date was moved up by nephrology due to needing dialysis sooner.  Discussed use the catheter immediately and then will use the graft in 1 month.  Risks and benefits discussed.  Marty Heck  Patient ID: Rachael Burke, female   DOB: May 08, 1956, 64 y.o.   MRN: [id number]  Reason for Consult: Follow-up   Referred by Horald Pollen, *  Subjective:    Subjective [] Expand by Default   HPI:  Rachael Burke is a 64 y.o. female with chronic kidney disease she has never been on dialysis in the past.  She has no previous dialysis access.  She has no previous upper extremity, chest, breast surgery.  She is right-hand dominant.      Past Medical History:  Diagnosis Date  . Anemia   . Anxiety   . Diabetes mellitus without complication ([place])   . Hyperlipidemia   . Hypertension   . Renal disorder         Family History  Problem Relation Age of Onset  . Diabetes Mother   . Hypertension Father   . Diabetes Sister   . Diabetes Brother   . Diabetes Brother         Past Surgical  History:  Procedure Laterality Date  . CESAREAN SECTION      Short Social History:  Social History       Tobacco Use  . Smoking status: Never Smoker  . Smokeless tobacco: Never Used  Substance Use Topics  . Alcohol use: Never    No Known Allergies        Current Outpatient Medications  Medication Sig Dispense Refill  . atorvastatin (LIPITOR) 20 MG tablet Take 1 tablet (20 mg total) by mouth daily. 30 tablet 0  . furosemide (LASIX) 80 MG tablet Take 1 tablet (80 mg total) by mouth 2 (two) times daily. 60 tablet 3  . labetalol (NORMODYNE) 200 MG tablet Take 200 mg by mouth 2 (two) times daily.    Marland Kitchen linagliptin (TRADJENTA) 5 MG TABS tablet Take 1 tablet (5 mg total) by mouth daily. 30 tablet 0   No current facility-administered medications for this visit.    Review of Systems  Constitutional:  Constitutional negative. HENT: HENT negative.  Eyes: Eyes negative.  Respiratory: Positive for shortness of breath.  Cardiovascular: Positive for dyspnea with exertion.  GI: Gastrointestinal negative.  Musculoskeletal: Musculoskeletal negative.  Skin: Skin negative.  Neurological: Positive for dizziness.  Hematologic: Hematologic/lymphatic negative.  Psychiatric: Psychiatric negative.        Objective:   Objective [] Expand by Default       Vitals:   03/01/20 1407  BP: Marland Kitchen)  161/92  Pulse: 69  Resp: 20  Temp: 97.6 F (36.4 C)  SpO2: 98%  Weight: 153 lb (69.4 kg)  Height: 5\' 3"  (1.6 m)   Body mass index is 27.1 kg/m.  Physical Exam HENT:     Head: Normocephalic.     Nose:     Comments: Wearing a mask Eyes:     Pupils: Pupils are equal, round, and reactive to light.  Cardiovascular:     Pulses:          Radial pulses are 2+ on the right side and 2+ on the left side.  Pulmonary:     Effort: Pulmonary effort is normal.  Abdominal:     General: Abdomen is flat.  Musculoskeletal:        General: No swelling. Normal range of motion.      Cervical back: Normal range of motion and neck supple. No tenderness.  Skin:    General: Skin is warm.     Capillary Refill: Capillary refill takes less than 2 seconds.  Neurological:     General: No focal deficit present.     Mental Status: She is alert.  Psychiatric:        Mood and Affect: Mood normal.        Behavior: Behavior normal.        Thought Content: Thought content normal.        Judgment: Judgment normal.     Data: +--------------+-------------+----------+--------+  Right CephalicDiameter (cm)Depth (cm)Findings  +--------------+-------------+----------+--------+  Shoulder     0.18              +--------------+-------------+----------+--------+  Mid upper arm   0.06              +--------------+-------------+----------+--------+  Prox forearm   0.17              +--------------+-------------+----------+--------+  Mid forearm    0.24              +--------------+-------------+----------+--------+  Dist forearm   0.11              +--------------+-------------+----------+--------+   +-----------------+-------------+----------+--------+  Right Basilic  Diameter (cm)Depth (cm)Findings  +-----------------+-------------+----------+--------+  Mid upper arm    0.35              +-----------------+-------------+----------+--------+  Dist upper arm    0.24              +-----------------+-------------+----------+--------+  Antecubital fossa  0.23         medial   +-----------------+-------------+----------+--------+   +--------------+-------------+----------+--------+  Left Cephalic Diameter (cm)Depth (cm)Findings  +--------------+-------------+----------+--------+  Shoulder     0.13              +--------------+-------------+----------+--------+  Mid upper arm   0.18               +--------------+-------------+----------+--------+  Dist upper arm  0.11              +--------------+-------------+----------+--------+  Prox forearm   1.11              +--------------+-------------+----------+--------+  Mid forearm    0.14              +--------------+-------------+----------+--------+   +-----------------+-------------+----------+--------+  Left Basilic   Diameter (cm)Depth (cm)Findings  +-----------------+-------------+----------+--------+  Mid upper arm    0.37              +-----------------+-------------+----------+--------+  Dist upper arm    0.19              +-----------------+-------------+----------+--------+  Antecubital fossa  0.16         medial   +-----------------+-------------+----------+--------+   Right Pre-Dialysis Findings:  +-----------------------+----------+--------------------+---------+--------  +  Location        PSV (cm/s)Intralum. Diam. (cm)Waveform  Comments  +-----------------------+----------+--------------------+---------+--------  +  Brachial Antecub. fossa129    0.44        triphasic        +-----------------------+----------+--------------------+---------+--------  +  Radial Art at Wrist  86    0.25        triphasic        +-----------------------+----------+--------------------+---------+--------  +  Ulnar Art at Wrist   32    0.14        biphasic         +-----------------------+----------+--------------------+---------+--------  +      Left Pre-Dialysis Findings:  +-----------------------+----------+--------------------+---------+--------  +  Location        PSV (cm/s)Intralum. Diam. (cm)Waveform  Comments  +-----------------------+----------+--------------------+---------+--------  +   Brachial Antecub. fossa121    0.42        triphasic        +-----------------------+----------+--------------------+---------+--------  +  Radial Art at Wrist  84    0.23        biphasic         +-----------------------+----------+--------------------+---------+--------  +  Ulnar Art at Wrist   100    0.22        biphasic         +-----------------------+----------+--------------------+---------+--------  +      Assessment/Plan:   Assessment   64 year old for she has never been on dialysis does not need urgent access.  Does not appear to have suitable vein on preoperative duplex I have elected for right basilic vein with ultrasound and the office on my own this really does not appear suitable for fistula.  With this we will wait till she is closer to requiring a graft and schedule her for left upper extremity AV graft at that time.  She will not need a follow-up appointment prior to that.     Waynetta Sandy MD [facility]

## 2020-05-20 NOTE — Anesthesia Procedure Notes (Signed)
Procedure Name: MAC Date/Time: 05/20/2020 9:29 AM Performed by: Rande Brunt, CRNA Pre-anesthesia Checklist: Patient identified, Emergency Drugs available, Suction available and Patient being monitored Patient Re-evaluated:Patient Re-evaluated prior to induction Oxygen Delivery Method: Simple face mask Induction Type: IV induction Placement Confirmation: positive ETCO2 and breath sounds checked- equal and bilateral Dental Injury: Teeth and Oropharynx as per pre-operative assessment

## 2020-05-20 NOTE — Anesthesia Postprocedure Evaluation (Signed)
Anesthesia Post Note  Patient: Jaiyana Canale de Arriola  Procedure(s) Performed: INSERTION OF LEFT ARM ARTERIOVENOUS (AV) FISTULA (Left ) INSERTION OF DIALYSIS CATHETER (N/A )     Patient location during evaluation: PACU Anesthesia Type: Regional and MAC Level of consciousness: awake and alert Pain management: pain level controlled Vital Signs Assessment: post-procedure vital signs reviewed and stable Respiratory status: spontaneous breathing, nonlabored ventilation and respiratory function stable Cardiovascular status: stable and blood pressure returned to baseline Postop Assessment: no apparent nausea or vomiting Anesthetic complications: no   No complications documented.  Last Vitals:  Vitals:   05/20/20 1225 05/20/20 1236  BP: (!) 163/66 (!) 152/68  Pulse: 64 62  Resp: 17 20  Temp:  36.4 C  SpO2: 97% 97%    Last Pain:  Vitals:   05/20/20 1155  TempSrc:   PainSc: 0-No pain                 Gustabo Gordillo,W. EDMOND

## 2020-05-20 NOTE — Anesthesia Procedure Notes (Signed)
Anesthesia Regional Block: Supraclavicular block   Pre-Anesthetic Checklist: ,, timeout performed, Correct Patient, Correct Site, Correct Laterality, Correct Procedure, Correct Position, site marked, Risks and benefits discussed, pre-op evaluation,  At surgeon's request and post-op pain management  Laterality: Left  Prep: Maximum Sterile Barrier Precautions used, Betadine       Needles:  Injection technique: Single-shot  Needle Type: Other     Needle Length: 9cm  Needle Gauge: 21     Additional Needles:   Procedures:,,,, ultrasound used (permanent image in chart),,,,  Narrative:  Start time: 05/20/2020 8:55 AM End time: 05/20/2020 9:05 AM Injection made incrementally with aspirations every 5 mL.  Additional Notes: 2% Lidocaine skin wheel. Intercostobrachial block with 10cc of 0.25% Bupivicaine plain.

## 2020-05-20 NOTE — Progress Notes (Signed)
Orthopedic Tech Progress Note Patient Details:  Rachael Burke June 15, 1955 [id number] PACU RN called requesting a ARM SLING Ortho Devices Type of Ortho Device: Arm sling Ortho Device/Splint Location: LUE Ortho Device/Splint Interventions: Application,Adjustment   Post Interventions Patient Tolerated: Well Instructions Provided: Care of device   [name] 05/20/2020, 12:32 PM

## 2020-05-21 ENCOUNTER — Encounter (HOSPITAL_COMMUNITY): Payer: Self-pay | Admitting: Vascular Surgery

## 2020-05-22 DIAGNOSIS — N184 Chronic kidney disease, stage 4 (severe): Secondary | ICD-10-CM | POA: Diagnosis not present

## 2020-05-23 ENCOUNTER — Telehealth: Payer: Self-pay

## 2020-05-23 ENCOUNTER — Other Ambulatory Visit: Payer: Self-pay | Admitting: Physician Assistant

## 2020-05-23 DIAGNOSIS — N178 Other acute kidney failure: Secondary | ICD-10-CM

## 2020-05-23 MED ORDER — TRAMADOL HCL 50 MG PO TABS: 50.0000 mg | ORAL_TABLET | Freq: Four times a day (QID) | ORAL | 0 refills | Status: DC | PRN

## 2020-05-23 NOTE — Telephone Encounter (Signed)
Patient's daughter called - patient is having some numbness all the way down her arm - started when fistula was placed on 12/20 and is worse today. The fistula site is red and purple bruised - arm is warm - hand is slightly cooler than arm. Unsure if there is drainage - daughter commented "underneath is jelly." I asked for further clarification, but still unclear on the meaning of jelly. Arm is swollen. Discussed with PA, called in pain medication and placed on schedule next week for post op follow up wound check and steal study. Instructed daughter to mention these signs/symptoms when patient is at HD. Verbalized understanding.

## 2020-05-26 NOTE — Progress Notes (Deleted)
Cardiology Office Note:    Date:  05/26/2020   ID:  [name], [name] May 26, 1956, MRN [id number]  PCP:  Horald Pollen, MD  Cardiologist:  No primary care provider on file.  Electrophysiologist:  None   Referring MD: Horald Pollen, *   No chief complaint on file. ***  History of Present Illness:    Rachael Burke is a 64 y.o. female with a hx of ESRD, T2DM, hypertension, hyperlipidemia who is referred by Dr. Mitchel Honour for evaluation of heart murmur.  She had moved to the [place] last year.  Creatinine was 1.7 in 2020, but had admission to [facility] in July 2021 with shortness of breath and edema, found to have creatinine up to 4.7.  In addition, was anemic with hemoglobin down to 6.  She was diuresed, and creatinine was 4.5 on discharge on 8/2.  Labs on 05/15/2020 showed creatinine up to 6.1.  Underwent tunneled dialysis catheter placement and AV fistula placement on 05/20/2020.  Past Medical History:  Diagnosis Date  . Anemia   . Anxiety   . Arthritis   . Diabetes mellitus without complication ([place])   . Hyperlipidemia   . Hypertension   . Pneumonia   . PONV (postoperative nausea and vomiting)   . Renal disorder     Past Surgical History:  Procedure Laterality Date  . AV FISTULA PLACEMENT Left 05/20/2020   Procedure: INSERTION OF LEFT ARM ARTERIOVENOUS (AV) FISTULA;  Surgeon: Marty Heck, MD;  Location: [facility];  Service: Vascular;  Laterality: Left;  . CESAREAN SECTION    . CHOLECYSTECTOMY    . INSERTION OF DIALYSIS CATHETER N/A 05/20/2020   Procedure: INSERTION OF DIALYSIS CATHETER;  Surgeon: Marty Heck, MD;  Location: [facility] [place];  Service: Vascular;  Laterality: N/A;    Current Medications: No outpatient medications have been marked as taking for the 05/28/20 encounter (Appointment) with Donato Heinz, MD.     Allergies:   Patient has no known allergies.   Social History   Socioeconomic History  . Marital  status: Married    Spouse name: Not on file  . Number of children: Not on file  . Years of education: Not on file  . Highest education level: Not on file  Occupational History  . Not on file  Tobacco Use  . Smoking status: Never Smoker  . Smokeless tobacco: Never Used  Vaping Use  . Vaping Use: Never used  Substance and Sexual Activity  . Alcohol use: Never  . Drug use: Never  . Sexual activity: Not on file  Other Topics Concern  . Not on file  Social History Narrative  . Not on file   Social Determinants of Health   Financial Resource Strain: Not on file  Food Insecurity: Not on file  Transportation Needs: Not on file  Physical Activity: Not on file  Stress: Not on file  Social Connections: Not on file     Family History: The patient's ***family history includes Diabetes in her brother, brother, mother, and sister; Hypertension in her father.  ROS:   Please see the history of present illness.    *** All other systems reviewed and are negative.  EKGs/Labs/Other Studies Reviewed:    The following studies were reviewed today: ***  EKG:  EKG is *** ordered today.  The ekg ordered today demonstrates ***  Recent Labs: 05/15/2020: ALT 31; Platelets 188 05/20/2020: BUN 64; Creatinine, Ser 5.60; Hemoglobin 9.5; Potassium 3.6; Sodium 144  Recent Lipid Panel    Component Value Date/Time   CHOL 252 (H) 04/06/2019 1137   TRIG 257 (H) 04/06/2019 1137   HDL 53 04/06/2019 1137   CHOLHDL 4.8 (H) 04/06/2019 1137   LDLCALC 152 (H) 04/06/2019 1137    Physical Exam:    VS:  There were no vitals taken for this visit.    Wt Readings from Last 3 Encounters:  05/20/20 164 lb 0.4 oz (74.4 kg)  05/15/20 164 lb (74.4 kg)  03/01/20 153 lb (69.4 kg)     GEN: *** Well nourished, well developed in no acute distress HEENT: Normal NECK: No JVD; No carotid bruits LYMPHATICS: No lymphadenopathy CARDIAC: ***RRR, no murmurs, rubs, gallops RESPIRATORY:  Clear to auscultation  without rales, wheezing or rhonchi  ABDOMEN: Soft, non-tender, non-distended MUSCULOSKELETAL:  No edema; No deformity  SKIN: Warm and dry NEUROLOGIC:  Alert and oriented x 3 PSYCHIATRIC:  Normal affect   ASSESSMENT:    No diagnosis found. PLAN:    Murmur:  ESRD:  Hypertension: On amlodipine 10 mg daily, Lasix 80 mg twice daily, labetalol 200 mg twice daily  Hyperlipidemia: On atorvastatin 20 mg daily.  LDL 152 on 04/06/2019  T2DM: On Tradjenta.  A1c 5.5 on 12/29/2019  RTC in***    Medication Adjustments/Labs and Tests Ordered: Current medicines are reviewed at length with the patient today.  Concerns regarding medicines are outlined above.  No orders of the defined types were placed in this encounter.  No orders of the defined types were placed in this encounter.   There are no Patient Instructions on file for this visit.   Signed, Donato Heinz, MD  05/26/2020 11:45 AM    [place]

## 2020-05-27 ENCOUNTER — Other Ambulatory Visit: Payer: Self-pay

## 2020-05-27 ENCOUNTER — Encounter (HOSPITAL_COMMUNITY): Payer: Self-pay

## 2020-05-27 DIAGNOSIS — N184 Chronic kidney disease, stage 4 (severe): Secondary | ICD-10-CM

## 2020-05-28 ENCOUNTER — Emergency Department (HOSPITAL_COMMUNITY): Payer: Medicaid Other

## 2020-05-28 ENCOUNTER — Ambulatory Visit: Payer: Self-pay | Admitting: Cardiology

## 2020-05-28 ENCOUNTER — Encounter (HOSPITAL_COMMUNITY): Payer: Self-pay

## 2020-05-28 ENCOUNTER — Other Ambulatory Visit: Payer: Self-pay

## 2020-05-28 ENCOUNTER — Emergency Department (HOSPITAL_COMMUNITY)
Admission: EM | Admit: 2020-05-28 | Discharge: 2020-05-28 | Disposition: A | Discharge status: Home / Self Care | Payer: Medicaid Other | Attending: Emergency Medicine | Admitting: Emergency Medicine

## 2020-05-28 DIAGNOSIS — R0602 Shortness of breath: Secondary | ICD-10-CM | POA: Insufficient documentation

## 2020-05-28 DIAGNOSIS — N186 End stage renal disease: Secondary | ICD-10-CM | POA: Insufficient documentation

## 2020-05-28 DIAGNOSIS — Z20822 Contact with and (suspected) exposure to covid-19: Secondary | ICD-10-CM | POA: Insufficient documentation

## 2020-05-28 DIAGNOSIS — Z79899 Other long term (current) drug therapy: Secondary | ICD-10-CM | POA: Insufficient documentation

## 2020-05-28 DIAGNOSIS — R609 Edema, unspecified: Secondary | ICD-10-CM | POA: Diagnosis not present

## 2020-05-28 DIAGNOSIS — R531 Weakness: Secondary | ICD-10-CM | POA: Diagnosis not present

## 2020-05-28 DIAGNOSIS — I120 Hypertensive chronic kidney disease with stage 5 chronic kidney disease or end stage renal disease: Secondary | ICD-10-CM | POA: Insufficient documentation

## 2020-05-28 DIAGNOSIS — Z7901 Long term (current) use of anticoagulants: Secondary | ICD-10-CM | POA: Insufficient documentation

## 2020-05-28 DIAGNOSIS — I517 Cardiomegaly: Secondary | ICD-10-CM | POA: Diagnosis not present

## 2020-05-28 DIAGNOSIS — E1122 Type 2 diabetes mellitus with diabetic chronic kidney disease: Secondary | ICD-10-CM | POA: Diagnosis not present

## 2020-05-28 DIAGNOSIS — Z992 Dependence on renal dialysis: Secondary | ICD-10-CM | POA: Insufficient documentation

## 2020-05-28 DIAGNOSIS — R0902 Hypoxemia: Secondary | ICD-10-CM | POA: Diagnosis not present

## 2020-05-28 LAB — CBC
HCT: 26.5 % — ABNORMAL LOW (ref 36.0–46.0)
Hemoglobin: 8.9 g/dL — ABNORMAL LOW (ref 12.0–15.0)
MCH: 28.9 pg (ref 26.0–34.0)
MCHC: 33.6 g/dL (ref 30.0–36.0)
MCV: 86 fL (ref 80.0–100.0)
Platelets: 145 10*3/uL — ABNORMAL LOW (ref 150–400)
RBC: 3.08 MIL/uL — ABNORMAL LOW (ref 3.87–5.11)
RDW: 13.7 % (ref 11.5–15.5)
WBC: 9.4 10*3/uL (ref 4.0–10.5)
nRBC: 0 % (ref 0.0–0.2)

## 2020-05-28 LAB — RESP PANEL BY RT-PCR (FLU A&B, COVID) ARPGX2
Influenza A by PCR: NEGATIVE
Influenza B by PCR: NEGATIVE
SARS Coronavirus 2 by RT PCR: NEGATIVE

## 2020-05-28 LAB — BASIC METABOLIC PANEL
Anion gap: 16 — ABNORMAL HIGH (ref 5–15)
BUN: 86 mg/dL — ABNORMAL HIGH (ref 8–23)
CO2: 21 mmol/L — ABNORMAL LOW (ref 22–32)
Calcium: 7.9 mg/dL — ABNORMAL LOW (ref 8.9–10.3)
Chloride: 99 mmol/L (ref 98–111)
Creatinine, Ser: 6.91 mg/dL — ABNORMAL HIGH (ref 0.44–1.00)
GFR, Estimated: 6 mL/min — ABNORMAL LOW (ref >60)
Glucose, Bld: 146 mg/dL — ABNORMAL HIGH (ref 70–99)
Potassium: 3.7 mmol/L (ref 3.5–5.1)
Sodium: 136 mmol/L (ref 135–145)

## 2020-05-28 LAB — HEPATIC FUNCTION PANEL
ALT: 6 U/L (ref 0–44)
AST: 17 U/L (ref 15–41)
Albumin: 3.3 g/dL — ABNORMAL LOW (ref 3.5–5.0)
Alkaline Phosphatase: 91 U/L (ref 38–126)
Bilirubin, Direct: 0.1 mg/dL (ref 0.0–0.2)
Indirect Bilirubin: 0.1 mg/dL — ABNORMAL LOW (ref 0.3–0.9)
Total Bilirubin: 0.2 mg/dL — ABNORMAL LOW (ref 0.3–1.2)
Total Protein: 6.5 g/dL (ref 6.5–8.1)

## 2020-05-28 LAB — BRAIN NATRIURETIC PEPTIDE: B Natriuretic Peptide: 566.6 pg/mL — ABNORMAL HIGH (ref 0.0–100.0)

## 2020-05-28 MED ORDER — FUROSEMIDE 20 MG PO TABS
120.0000 mg | ORAL_TABLET | Freq: Once | ORAL | Status: AC
  Administered 2020-05-28: 19:00:00 120 mg via ORAL
  Filled 2020-05-28: qty 6

## 2020-05-28 NOTE — ED Notes (Signed)
Pts daughter wheeled pt out before being able to obtain temperature. This NT was waiting for registration to finish with pt.

## 2020-05-28 NOTE — Discharge Instructions (Signed)
As discussed, please make sure to go to dialysis tomorrow.  We did give you a medicine called Lasix which should promote you peeing more and help get the fluid off your body.  Please return to the ER for any significant worsening symptoms.  Como se mencion, asegrese de ir a Regulatory affairs officer. Le dimos un medicamento llamado Lasix que debera estimularlo a orinar ms y ayudar a Tour manager lquido de su cuerpo. Regrese a la sala de emergencias por cualquier sntoma que empeore de [employer].

## 2020-05-28 NOTE — ED Triage Notes (Signed)
Pt BIB [facility] for intermittent  generalized weakness since Saturday becoming progressively worse today . Pt is scheduled to possibly start dialysis beginning of next week. Does still make some urine   Right side port Left arm fistula   BP 156/90 HR 69 94% RA 100% 2L [place]  CBG 171

## 2020-05-28 NOTE — ED Notes (Signed)
Pt ambulated in the hallway. Pt started at 100% and stayed 100% the whole time ambulating pt in hallway

## 2020-05-28 NOTE — ED Provider Notes (Signed)
[facility] EMERGENCY DEPARTMENT Provider Note   CSN: [id number] Arrival date & time: 05/28/20  1532     History Chief Complaint  Patient presents with  . Weakness    Rachael Burke is a 64 y.o. female.  HPI 64 year old female with a history of anemia, DM type II, hyperlipidemia, hypertension, ESRD with scheduled dialysis tomorrow at 11 AM, presents to the ER with worsening shortness of breath for the last week.  History provided via Spanish interpreter and daughter at bedside.  Patient has noticed some swelling in her lower extremities, and worsening weakness and shortness of breath.  Had a fistula placed in her left arm approximately a week ago.  Scheduled have dialysis tomorrow.  No fevers or chills.  No chest pain.  No nausea or vomiting.  Has shortness of breath on exertion.  No syncope.  She makes some urine but it is very infrequent.  She states she ambulates without a walker or a wheelchair, but with assistance from family.  Still been able to ambulate at her baseline.  Wanted to be evaluated given that her weakness feels worse.  Daughter states that allegedly they missed her dialysis appointment this week unknowingly as she was never contacted about this by the dialysis center.    Past Medical History:  Diagnosis Date  . Anemia   . Anxiety   . Arthritis   . Diabetes mellitus without complication ([place])   . Hyperlipidemia   . Hypertension   . Pneumonia   . PONV (postoperative nausea and vomiting)   . Renal disorder     Patient Active Problem List   Diagnosis Date Noted  . Acute renal failure superimposed on chronic kidney disease ([place]) 12/29/2019  . Anemia associated with chronic renal failure 12/29/2019  . Hyperkalemia 12/29/2019  . Symptomatic anemia 04/08/2019  . HLD (hyperlipidemia) 04/08/2019  . AKI (acute kidney injury) ([place]) 04/07/2019  . Hypertension associated with diabetes ([place]) 04/06/2019  . Type 2 diabetes mellitus with  complication, without long-term current use of insulin ([place]) 01/04/2018    Past Surgical History:  Procedure Laterality Date  . AV FISTULA PLACEMENT Left 05/20/2020   Procedure: INSERTION OF LEFT ARM ARTERIOVENOUS (AV) FISTULA;  Surgeon: Marty Heck, MD;  Location: [facility];  Service: Vascular;  Laterality: Left;  . CESAREAN SECTION    . CHOLECYSTECTOMY    . INSERTION OF DIALYSIS CATHETER N/A 05/20/2020   Procedure: INSERTION OF DIALYSIS CATHETER;  Surgeon: Marty Heck, MD;  Location: [facility];  Service: Vascular;  Laterality: N/A;     OB History   No obstetric history on file.     Family History  Problem Relation Age of Onset  . Diabetes Mother   . Hypertension Father   . Diabetes Sister   . Diabetes Brother   . Diabetes Brother     Social History   Tobacco Use  . Smoking status: Never Smoker  . Smokeless tobacco: Never Used  Vaping Use  . Vaping Use: Never used  Substance Use Topics  . Alcohol use: Never  . Drug use: Never    Home Medications Prior to Admission medications   Medication Sig Start Date End Date Taking? Authorizing Provider  amLODipine (NORVASC) 10 MG tablet Take 10 mg by mouth daily.    [provider]  atorvastatin (LIPITOR) 20 MG tablet Take 1 tablet (20 mg total) by mouth daily. 01/01/20   Charlynne Cousins, MD  bisacodyl (DULCOLAX) 10 MG suppository Place 1  suppository (10 mg total) rectally as needed for moderate constipation. 05/15/20   Just, Laurita Quint, FNP  clopidogrel (PLAVIX) 75 MG tablet Take 75 mg by mouth daily.    [provider]  docusate sodium (COLACE) 100 MG capsule Take 1-2 capsules (100-200 mg total) by mouth 2 (two) times daily. Start with twice a day once having regular BM switch to daily 05/15/20   Just, Laurita Quint, FNP  ferrous sulfate 325 (65 FE) MG tablet Take 325 mg by mouth daily with breakfast.    [provider]  furosemide (LASIX) 80 MG tablet Take 1 tablet (80 mg total) by mouth 2  (two) times daily. 01/01/20   Charlynne Cousins, MD  labetalol (NORMODYNE) 200 MG tablet Take 200 mg by mouth 2 (two) times daily. 02/01/20   [provider]  linagliptin (TRADJENTA) 5 MG TABS tablet Take 1 tablet (5 mg total) by mouth daily. 01/01/20   Charlynne Cousins, MD  oxyCODONE-acetaminophen (PERCOCET) 5-325 MG tablet Take 1 tablet by mouth every 4 (four) hours as needed for severe pain. 05/20/20   Baglia, Corrina, PA-C  polyethylene glycol powder (GLYCOLAX/MIRALAX) 17 GM/SCOOP powder Take 17 g by mouth 2 (two) times daily as needed. 05/15/20   Just, Laurita Quint, FNP  sodium zirconium cyclosilicate (LOKELMA) 10 g PACK packet Take 10 g by mouth 3 (three) times daily.    [provider]  traMADol (ULTRAM) 50 MG tablet Take 1 tablet (50 mg total) by mouth every 6 (six) hours as needed. 05/23/20   Ulyses Amor, PA-C    Allergies    Patient has no known allergies.  Review of Systems   Review of Systems  Constitutional: Negative for chills and fever.  HENT: Negative for ear pain and sore throat.   Eyes: Negative for pain and visual disturbance.  Respiratory: Positive for shortness of breath. Negative for cough.   Cardiovascular: Positive for leg swelling. Negative for chest pain and palpitations.  Gastrointestinal: Negative for abdominal pain, diarrhea, nausea and vomiting.  Genitourinary: Negative for dysuria and hematuria.  Musculoskeletal: Negative for arthralgias and back pain.  Skin: Negative for color change and rash.  Neurological: Negative for seizures and syncope.  All other systems reviewed and are negative.   Physical Exam Updated Vital Signs BP (!) 130/56   Pulse 65   Temp 98.7 F (37.1 C) (Oral)   Resp 10   Ht 5\' 3"  (1.6 m)   Wt 74.4 kg   SpO2 99%   BMI 29.06 kg/m   Physical Exam Vitals and nursing note reviewed.  Constitutional:      General: She is not in acute distress.    Appearance: She is well-developed and well-nourished. She is  obese. She is ill-appearing (Chronically ill-appearing).  HENT:     Head: Normocephalic and atraumatic.  Eyes:     General: No scleral icterus.       Right eye: No discharge.        Left eye: No discharge.     Conjunctiva/sclera: Conjunctivae normal.  Cardiovascular:     Rate and Rhythm: Normal rate and regular rhythm.     Heart sounds: No murmur heard.   Pulmonary:     Effort: Pulmonary effort is normal. No respiratory distress.     Breath sounds: Normal breath sounds.     Comments: Lung sounds clear with no rales, wheezes or rhonchi Abdominal:     Palpations: Abdomen is soft.     Tenderness: There is no abdominal  tenderness.     Comments: Abdomen with notable anasarca  Musculoskeletal:     Cervical back: Neck supple.     Right lower leg: Edema present.     Left lower leg: Edema present.     Comments: 1+ pitting edema to her lower extremities bilaterally.  No significant erythema.  Fistula in left arm with palpable thrill, no surrounding erythema, warmth.  Dialysis catheter insertion with no erythema or redness  Skin:    General: Skin is warm and dry.     Coloration: Skin is jaundiced (Mildly).     Findings: No erythema or rash.  Neurological:     General: No focal deficit present.     Mental Status: She is alert and oriented to person, place, and time.  Psychiatric:        Mood and Affect: Mood and affect and mood normal.        Behavior: Behavior normal.     ED Results / Procedures / Treatments   Labs (all labs ordered are listed, but only abnormal results are displayed) Labs Reviewed  BASIC METABOLIC PANEL - Abnormal; Notable for the following components:      Result Value   CO2 21 (*)    Glucose, Bld 146 (*)    BUN 86 (*)    Creatinine, Ser 6.91 (*)    Calcium 7.9 (*)    GFR, Estimated 6 (*)    Anion gap 16 (*)    All other components within normal limits  CBC - Abnormal; Notable for the following components:   RBC 3.08 (*)    Hemoglobin 8.9 (*)    HCT  26.5 (*)    Platelets 145 (*)    All other components within normal limits  HEPATIC FUNCTION PANEL - Abnormal; Notable for the following components:   Albumin 3.3 (*)    Total Bilirubin 0.2 (*)    Indirect Bilirubin 0.1 (*)    All other components within normal limits  BRAIN NATRIURETIC PEPTIDE - Abnormal; Notable for the following components:   B Natriuretic Peptide 566.6 (*)    All other components within normal limits  RESP PANEL BY RT-PCR (FLU A&B, COVID) ARPGX2  URINALYSIS, ROUTINE W REFLEX MICROSCOPIC  CBG MONITORING, ED    EKG EKG Interpretation  Date/Time:  Tuesday May 28 2020 15:36:00 EST Ventricular Rate:  66 PR Interval:  190 QRS Duration: 90 QT Interval:  452 QTC Calculation: 473 R Axis:   -7 Text Interpretation: Normal sinus rhythm Cannot rule out Anterior infarct , age undetermined Abnormal ECG When comapred to prior, similar apperance. No STEMI Confirmed by Antony Blackbird [phone]) on 05/28/2020 4:41:34 PM   Radiology DG Chest Portable 1 View  Result Date: 05/28/2020 CLINICAL DATA:  Weakness EXAM: PORTABLE CHEST 1 VIEW COMPARISON:  May 20, 2020 FINDINGS: Leads project over the chest. RIGHT-sided dialysis catheter with tip in the upper RIGHT atrium. Unchanged position. Trachea midline. Cardiomediastinal contours with stable cardiac enlargement and with evidence of central pulmonary vascular engorgement. Mildly elevated RIGHT hemidiaphragm. No lobar consolidation. No sign of effusion. On limited assessment no acute skeletal process. IMPRESSION: Cardiomegaly with central pulmonary vascular engorgement. No acute cardiopulmonary disease. Electronically Signed   By: Zetta Bills M.D.   On: 05/28/2020 17:30    Procedures Procedures (including critical care time)  Medications Ordered in ED Medications  furosemide (LASIX) tablet 120 mg (120 mg Oral Given 05/28/20 1854)    ED Course  I have reviewed the triage vital signs and the  nursing notes.  Pertinent  labs & imaging results that were available during my care of the patient were reviewed by me and considered in my medical decision making (see chart for details).    MDM Rules/Calculators/A&P                         64 year old female with progressive shortness of breath over the last week.  Scheduled to have dialysis tomorrow.  On arrival, vitals overall reassuring, not hypoxic, tachypneic or tachycardic.  Visibly fluid overloaded in her lower extremities and abdomen.  Mildly jaundiced, however daughter states the appears to be at baseline with this.  Labs ordered, reviewed and interpreted by me, liver function tests are normal.  Mildly decreased total bilirubin.  Patient also states that she has had her gallbladder out.  BMP without any significant electrolyte abnormalities, renal function consistent with ESRD, though her creatinine has increased about 20 points in the last 8 days.  CBC without leukocytosis.  Hemoglobin of 8.9, patient appears to have a history of anemia with prior hospitalization and requiring blood transfusion, however it appears to be stable at this time.  Covid test is negative.  BNP 566, likely secondary to ESRD.  X-ray with cardiomegaly and central pulmonary vascular congestion.  Again I think this is more secondary to to fluid overload from needing dialysis.  Covid test is negative.  Patient was ambulated in the ER with sats remaining at 100%.  EKG is largely unchanged.  Consulted Dr. Augustin Coupe with nephrology to discuss if the patient needs to be admitted to the hospital for dialysis.  Dr. Augustin Coupe feels that the patient is not hypoxic and with the overall hospital being full right now, she is stable for discharge with strict instructions to go to dialysis tomorrow.  He did recommend 120 mg of Lasix here in the ED we gave here.  Suspicion for PE as the patient not tachycardic, hypoxic, has had no chest pain.  No evidence of sepsis.  No neurologic deficits on exam, low suspicion for  stroke.   I explained the overall reassuring findings to the patient's daughter and the patient.  They are agreeable to discharge with strict instructions to go to dialysis tomorrow.  They voiced understanding and are agreeable.  Precautions discussed.  All the patient's daughters questions have been answered to her satisfaction.  At this stage in the ED course, the patient is medically screened and stable discharge.  Discussed the case with my supervising physician Dr. Sherry Ruffing who is agreeable to the above plan and disposition.   Final Clinical Impression(s) / ED Diagnoses Final diagnoses:  Weakness  Shortness of breath    Rx / DC Orders ED Discharge Orders    None       Tanylah, Schnoebelen 05/28/20 1905    Tegeler, Gwenyth Allegra, MD 05/28/20 2202

## 2020-05-29 ENCOUNTER — Ambulatory Visit (INDEPENDENT_AMBULATORY_CARE_PROVIDER_SITE_OTHER): Payer: Self-pay | Admitting: Physician Assistant

## 2020-05-29 ENCOUNTER — Ambulatory Visit (HOSPITAL_COMMUNITY)
Admission: RE | Admit: 2020-05-29 | Discharge: 2020-05-29 | Disposition: A | Discharge status: Home / Self Care | Payer: Medicaid Other | Source: Ambulatory Visit | Attending: Physician Assistant | Admitting: Physician Assistant

## 2020-05-29 VITALS — BP 124/66 | HR 65 | Temp 98.1°F | Resp 20 | Ht 63.0 in | Wt 164.0 lb

## 2020-05-29 DIAGNOSIS — N184 Chronic kidney disease, stage 4 (severe): Secondary | ICD-10-CM | POA: Diagnosis not present

## 2020-05-29 DIAGNOSIS — N186 End stage renal disease: Secondary | ICD-10-CM

## 2020-05-29 DIAGNOSIS — Z992 Dependence on renal dialysis: Secondary | ICD-10-CM

## 2020-05-29 NOTE — Progress Notes (Signed)
POST OPERATIVE DIALYSIS ACCESS OFFICE NOTE    CC:  F/u for dialysis access surgery  HPI:  This is a 64 y.o. female who is s/p left first stage basilic vein transposition (brachiobasilic arteriovenous fistula) placement.  She is postoperative day 9 complains of left upper extremity swelling, tingling and numbness since srugery.  The patient is Spanish-speaking and her daughter accompanies her today who is fluent in [place].  They declined video interpretive services.  She denies hand pain.  She is also status post placement of right IJ tunneled dialysis catheter.  No issues with treatment via catheter.  No fever or chills.  Dialysis days:  TTS   Dialysis center:  [facility]  No Known Allergies  Current Outpatient Medications  Medication Sig Dispense Refill  . amLODipine (NORVASC) 10 MG tablet Take 10 mg by mouth daily.    Marland Kitchen atorvastatin (LIPITOR) 20 MG tablet Take 1 tablet (20 mg total) by mouth daily. 30 tablet 0  . bisacodyl (DULCOLAX) 10 MG suppository Place 1 suppository (10 mg total) rectally as needed for moderate constipation. 12 suppository 0  . clopidogrel (PLAVIX) 75 MG tablet Take 75 mg by mouth daily.    Marland Kitchen docusate sodium (COLACE) 100 MG capsule Take 1-2 capsules (100-200 mg total) by mouth 2 (two) times daily. Start with twice a day once having regular BM switch to daily 60 capsule 3  . ferrous sulfate 325 (65 FE) MG tablet Take 325 mg by mouth daily with breakfast.    . furosemide (LASIX) 80 MG tablet Take 1 tablet (80 mg total) by mouth 2 (two) times daily. 60 tablet 3  . labetalol (NORMODYNE) 200 MG tablet Take 200 mg by mouth 2 (two) times daily.    Marland Kitchen linagliptin (TRADJENTA) 5 MG TABS tablet Take 1 tablet (5 mg total) by mouth daily. 30 tablet 0  . oxyCODONE-acetaminophen (PERCOCET) 5-325 MG tablet Take 1 tablet by mouth every 4 (four) hours as needed for severe pain. 6 tablet 0  . polyethylene glycol powder (GLYCOLAX/MIRALAX) 17 GM/SCOOP powder Take 17 g  by mouth 2 (two) times daily as needed. 3350 g 3  . sodium zirconium cyclosilicate (LOKELMA) 10 g PACK packet Take 10 g by mouth 3 (three) times daily.    . traMADol (ULTRAM) 50 MG tablet Take 1 tablet (50 mg total) by mouth every 6 (six) hours as needed. 20 tablet 0   No current facility-administered medications for this visit.     ROS:  See HPI  BP 124/66 (BP Location: Right Arm, Patient Position: Sitting, Cuff Size: Normal)   Pulse 65   Temp 98.1 F (36.7 C) (Temporal)   Resp 20   Ht 5\' 3"  (1.6 m)   Wt 164 lb 0.4 oz (74.4 kg)   SpO2 98%   BMI 29.06 kg/m    Physical Exam:  General appearance: Well-developed, well-nourished in no apparent distress Cardiac: Rate and rhythm regular Respiratory: Nonlabored Incision: Approximated without bleeding or drainage Extremities: Inspection of the upper extremity reveals mild edema of the medial aspect of her left elbow.  There is no fluctuance or erythema.  Her hand is warm and pink.  There is good capillary refill.  There is a good bruit in the fistula.  Her radial pulse is weakly palpable.  She has 5/5 hand grip strength and intact sensation. Her radial, ulnar and palmar arch Doppler signals are biphasic.  There is no significant change with compression of the fistula of the radial pulse.  Dialysis  duplex on 05/29/2020 Summary:  2nd digit pressure increases with AVF compression.   *See table(s) above for measurements and observations.    Assessment/Plan:   -POD 9 left first stage brachiobasilic AV fistula.  Numbness and tingling of the left upper extremity without evidence of arterial compromise to her hand.  Mild residual edema of the medial elbow.  No fluid collection noted on ultrasound.  I discussed with the patient and her daughter to monitor the symptoms and will hopefully improve as her edema resolves.  I will have them return in 2 weeks to assess symptoms.  I have advised them to call our office sooner should the symptoms  increase, she develops hand pain, inability to use fingers or clumsiness or develops a cold extremity.  They verbalized understanding.  Barbie Banner, PA-C 05/29/2020 10:29 AM [facility] [phone]  Clinic MD:  Dr. Oneida Alar

## 2020-06-03 ENCOUNTER — Other Ambulatory Visit (HOSPITAL_COMMUNITY): Payer: Self-pay

## 2020-06-05 ENCOUNTER — Other Ambulatory Visit: Payer: Self-pay | Admitting: *Deleted

## 2020-06-05 DIAGNOSIS — N186 End stage renal disease: Secondary | ICD-10-CM

## 2020-06-06 ENCOUNTER — Ambulatory Visit (INDEPENDENT_AMBULATORY_CARE_PROVIDER_SITE_OTHER): Payer: Medicaid Other | Admitting: Cardiology

## 2020-06-06 ENCOUNTER — Encounter: Payer: Self-pay | Admitting: Cardiology

## 2020-06-06 ENCOUNTER — Other Ambulatory Visit: Payer: Self-pay

## 2020-06-06 VITALS — BP 143/63 | HR 69 | Ht 62.0 in | Wt 157.2 lb

## 2020-06-06 DIAGNOSIS — R072 Precordial pain: Secondary | ICD-10-CM | POA: Diagnosis not present

## 2020-06-06 DIAGNOSIS — Z992 Dependence on renal dialysis: Secondary | ICD-10-CM | POA: Diagnosis not present

## 2020-06-06 DIAGNOSIS — R011 Cardiac murmur, unspecified: Secondary | ICD-10-CM

## 2020-06-06 DIAGNOSIS — R079 Chest pain, unspecified: Secondary | ICD-10-CM | POA: Diagnosis not present

## 2020-06-06 DIAGNOSIS — E119 Type 2 diabetes mellitus without complications: Secondary | ICD-10-CM | POA: Diagnosis not present

## 2020-06-06 DIAGNOSIS — I10 Essential (primary) hypertension: Secondary | ICD-10-CM | POA: Diagnosis not present

## 2020-06-06 DIAGNOSIS — R0601 Orthopnea: Secondary | ICD-10-CM

## 2020-06-06 DIAGNOSIS — R0600 Dyspnea, unspecified: Secondary | ICD-10-CM

## 2020-06-06 DIAGNOSIS — N186 End stage renal disease: Secondary | ICD-10-CM

## 2020-06-06 NOTE — Patient Instructions (Addendum)
Medication Instructions:  Your Physician recommend you continue on your current medication as directed.    *If you need a refill on your cardiac medications before your next appointment, please call your pharmacy*   Lab Work: None   Testing/Procedures: Echocardiography is a painless test that uses sound waves to create images of your heart. It provides your doctor with information about the size and shape of your heart and how well your heart's chambers and valves are working. This procedure takes approximately one hour. There are no restrictions for this procedure. [facility] has requested that you have a lexiscan myoview. For further information please visit [email or web address]. Please follow instruction sheet, as given. [place]. [address]  Follow-Up: At [facility], you and your health needs are our priority.  As part of our continuing mission to provide you with exceptional heart care, we have created designated Provider Care Teams.  These Care Teams include your primary Cardiologist (physician) and Advanced Practice Providers (APPs -  Physician Assistants and Nurse Practitioners) who all work together to provide you with the care you need, when you need it.  We recommend signing up for the patient portal called "MyChart".  Sign up information is provided on this After Visit Summary.  MyChart is used to connect with patients for Virtual Visits (Telemedicine).  Patients are able to view lab/test results, encounter notes, upcoming appointments, etc.  Non-urgent messages can be sent to your provider as well.   To learn more about what you can do with MyChart, go to [email or web address].    Your next appointment:   Based on test results   The format for your next appointment:   In Person  Provider:   Buford Dresser, MD  You are scheduled for a Myocardial Perfusion Imaging Study on Please arrive 15 minutes prior to your  appointment time for registration and insurance purposes.  The test will take approximately 3 to 4 hours to complete; you may bring reading material.  If someone comes with you to your appointment, they will need to remain in the main lobby due to limited space in the testing area. **If you are pregnant or breastfeeding, please notify the nuclear lab prior to your appointment**  How to prepare for your Myocardial Perfusion Test: . Do not eat or drink 3 hours prior to your test, except you may have water. . Do not consume products containing caffeine (regular or decaffeinated) 12 hours prior to your test. (ex: coffee, chocolate, sodas, tea). . Do bring a list of your current medications with you.  If not listed below, you may take your medications as normal. . Do wear comfortable clothes (no dresses or overalls) and walking shoes, tennis shoes preferred (No heels or open toe shoes are allowed). . Do NOT wear cologne, perfume, aftershave, or lotions (deodorant is allowed). . If these instructions are not followed, your test will have to be rescheduled.  Por favor presntese a la direccin [place], [address] para su examen.  Si tiene [place] inquietud o pregunta sobre su cita, puede llamar al Nuclear Lab at [phone].  If you cannot keep your appointment, please provide 24 hours notification to the Nuclear Lab, to avoid a possible $50 charge to your account.

## 2020-06-06 NOTE — Progress Notes (Signed)
Cardiology Office Note:    Date:  06/06/2020   ID:  Rachael Burke 23-Jul-1955, MRN [id number]  PCP:  Horald Pollen, MD  Cardiologist:  Buford Dresser, MD  Referring MD: Horald Pollen, *   CC: new patient evaluation for murmur  History of Present Illness:    Rachael Burke is a 65 y.o. female with a hx of hypertension, type II diabetes, hyperlipidemia, ESRD on dialysis who is seen as a new consult at the request of Horald Pollen, * for the evaluation and management of murmur.  She does not have an office visit with Dr. Mitchel Honour that I can see since 05/11/19. Referral is dated 03/29/20.   She was recently seen in the ER 05/28/20 for weakness, LE edema, shortness of breath. She was due for dialysis the following day and discharged from the ER.  Reviewed note from 05/20/20 for creation of left arm AV fistula.  Patient is accompanied by her daughter as well as [facility] interpreter Arbie Cookey today.   Other patient concerns: heart races at night, sometimes feels short of breath when sleeping like she has to sit up. Started about 3 mos ago, no longer every day but still happens occasionally since starting dialysis  Cardiovascular risk factors: Prior clinical ASCVD: has never been told she has heart issues until she was recently diagnosed with a murmur Comorbid conditions: hypertension, hyperlipidemia, diabetes, ESRD on dialysis Metabolic syndrome/Obesity: BMI 28 Tobacco use history: never Family history: none that she is aware of.  Endorses chest pressure sometimes at night, also notes when she is walking. 2-3 months ago was taking a bath and lost consciousness, felt lightheaded, fell down and woke right back up. Has happened three times total, twice in [place] about six months ago and then this event. .  Denies shortness of breath at rest. LE edema improving since starting dialysis. Endorses PND, two-three pillow orthopnea unchanged.   Past  Medical History:  Diagnosis Date  . Anemia   . Anxiety   . Arthritis   . Diabetes mellitus without complication ([place])   . Hyperlipidemia   . Hypertension   . Pneumonia   . PONV (postoperative nausea and vomiting)   . Renal disorder     Past Surgical History:  Procedure Laterality Date  . AV FISTULA PLACEMENT Left 05/20/2020   Procedure: INSERTION OF LEFT ARM ARTERIOVENOUS (AV) FISTULA;  Surgeon: Marty Heck, MD;  Location: [place];  Service: Vascular;  Laterality: Left;  . CESAREAN SECTION    . CHOLECYSTECTOMY    . INSERTION OF DIALYSIS CATHETER N/A 05/20/2020   Procedure: INSERTION OF DIALYSIS CATHETER;  Surgeon: Marty Heck, MD;  Location: [facility] OR;  Service: Vascular;  Laterality: N/A;    Current Medications: Current Outpatient Medications on File Prior to Visit  Medication Sig  . amLODipine (NORVASC) 10 MG tablet Take 10 mg by mouth daily.  Marland Kitchen atorvastatin (LIPITOR) 20 MG tablet Take 1 tablet (20 mg total) by mouth daily.  . bisacodyl (DULCOLAX) 10 MG suppository Place 1 suppository (10 mg total) rectally as needed for moderate constipation.  . clopidogrel (PLAVIX) 75 MG tablet Take 75 mg by mouth daily.  Marland Kitchen docusate sodium (COLACE) 100 MG capsule Take 1-2 capsules (100-200 mg total) by mouth 2 (two) times daily. Start with twice a day once having regular BM switch to daily  . ferrous sulfate 325 (65 FE) MG tablet Take 325 mg by mouth daily with breakfast.  . furosemide (LASIX) 80  MG tablet Take 1 tablet (80 mg total) by mouth 2 (two) times daily.  Marland Kitchen labetalol (NORMODYNE) 200 MG tablet Take 200 mg by mouth 2 (two) times daily.  Marland Kitchen linagliptin (TRADJENTA) 5 MG TABS tablet Take 1 tablet (5 mg total) by mouth daily.  . polyethylene glycol powder (GLYCOLAX/MIRALAX) 17 GM/SCOOP powder Take 17 g by mouth 2 (two) times daily as needed.  . sodium zirconium cyclosilicate (LOKELMA) 10 g PACK packet Take 10 g by mouth 3 (three) times daily.   No current facility-administered  medications on file prior to visit.     Allergies:   Patient has no known allergies.   Social History   Tobacco Use  . Smoking status: Never Smoker  . Smokeless tobacco: Never Used  Vaping Use  . Vaping Use: Never used  Substance Use Topics  . Alcohol use: Never  . Drug use: Never    Family History: family history includes Diabetes in her brother, brother, mother, and sister; Hypertension in her father.  ROS:   Please see the history of present illness.  Additional pertinent ROS: Constitutional: Negative for chills, fever, night sweats, unintentional weight loss  HENT: Negative for ear pain and hearing loss.   Eyes: Negative for loss of vision and eye pain.  Respiratory: Negative for cough, sputum, wheezing.   Cardiovascular: See HPI. Gastrointestinal: Negative for abdominal pain, melena, and hematochezia.  Genitourinary: Negative for dysuria and hematuria.  Musculoskeletal: Negative for falls and myalgias.  Skin: Negative for itching and rash.  Neurological: Negative for focal weakness, focal sensory changes and loss of consciousness.  Endo/Heme/Allergies: Does not bruise/bleed easily.     EKGs/Labs/Other Studies Reviewed:    The following studies were reviewed today: No prior cardiac studies  EKG:  EKG is personally reviewed.  The ekg ordered today demonstrates NSR at 69 bpm  Recent Labs: 05/28/2020: ALT 6; B Natriuretic Peptide 566.6; BUN 86; Creatinine, Ser 6.91; Hemoglobin 8.9; Platelets 145; Potassium 3.7; Sodium 136  Recent Lipid Panel    Component Value Date/Time   CHOL 252 (H) 04/06/2019 1137   TRIG 257 (H) 04/06/2019 1137   HDL 53 04/06/2019 1137   CHOLHDL 4.8 (H) 04/06/2019 1137   LDLCALC 152 (H) 04/06/2019 1137    Physical Exam:    VS:  BP (!) 143/63   Pulse 69   Ht 5\' 2"  (1.575 m)   Wt 157 lb 3.2 oz (71.3 kg)   SpO2 99%   BMI 28.75 kg/m     Wt Readings from Last 3 Encounters:  06/06/20 157 lb 3.2 oz (71.3 kg)  05/29/20 164 lb 0.4 oz (74.4  kg)  05/28/20 164 lb 0.4 oz (74.4 kg)    GEN: Well nourished, well developed in no acute distress HEENT: Normal, moist mucous membranes NECK: No JVD CARDIAC: regular rhythm, normal S1 and S2, no rubs or gallops. 2/6 systolic murmur best heard at LSB and L carotid. VASCULAR: Radial and DP pulses 2+ bilaterally. L fistula +thrill RESPIRATORY:  Clear to auscultation without rales, wheezing or rhonchi  ABDOMEN: Soft, non-tender, non-distended MUSCULOSKELETAL:  Ambulates independently SKIN: Warm and dry, no significant LE edema NEUROLOGIC:  Alert and oriented x 3. No focal neuro deficits noted. PSYCHIATRIC:  Normal affect    ASSESSMENT:    1. Murmur, cardiac   2. PND (paroxysmal nocturnal dyspnea)   3. Chest pain of uncertain etiology   4. Orthopnea   5. ESRD (end stage renal disease) on dialysis ([place])   6. Essential hypertension  7. Type 2 diabetes mellitus without obesity ([place])   8. Precordial pain    PLAN:    Murmur:  -echocardiogram  PND, orthopnea -not improved despite dialysis -echo to evaluate for cardiac function and valve disease  Chest pain -discussed risk of radiation, IV, regadenoson. Based on shared decision making, decision was made to pursue CT lexiscan stress test. -already on statin, beta blocker, clopidogrel, continue  ESRD -management of fluid per dialysis, reports improved LE edema -continue lasix, lokelma, dialysis per nephrology  Hypertension: -slightly elevated today, defer to her nephrologist for management -continue amlodipine, lasix, labetalol  Type II diabetes without obesity -given ESRD, cannot use SGLT2i or GLP1RA  Cardiac risk counseling and prevention recommendations: -recommend heart healthy/Mediterranean diet, with whole grains, fruits, vegetable, fish, lean meats, nuts, and olive oil. Limit salt. -recommend moderate walking, 3-5 times/week for 30-50 minutes each session. Aim for at least 150 minutes.week. Goal should be pace of 3  miles/hours, or walking 1.5 miles in 30 minutes -recommend avoidance of tobacco products. Avoid excess alcohol. -ASCVD risk score: The 10-year ASCVD risk score Mikey Bussing DC Brooke Bonito., et al., 2013) is: 17.8%   Values used to calculate the score:     Age: 69 years     Sex: Female     Is Non-Hispanic African American: No     Diabetic: Yes     Tobacco smoker: No     Systolic Blood Pressure: 956 mmHg     Is BP treated: Yes     HDL Cholesterol: 53 mg/dL     Total Cholesterol: 252 mg/dL    Plan for follow up: to be determine based on results of testing  Buford Dresser, MD, PhD [place]  [facility]    Medication Adjustments/Labs and Tests Ordered: Current medicines are reviewed at length with the patient today.  Concerns regarding medicines are outlined above.  Orders Placed This Encounter  Procedures  . Cardiac Stress Test: Informed Consent Details: Physician/Practitioner Attestation; Transcribe to consent form and obtain patient signature  . MYOCARDIAL PERFUSION IMAGING  . EKG 12-Lead  . ECHOCARDIOGRAM COMPLETE   No orders of the defined types were placed in this encounter.   Patient Instructions  Medication Instructions:  Your Physician recommend you continue on your current medication as directed.    *If you need a refill on your cardiac medications before your next appointment, please call your pharmacy*   Lab Work: None   Testing/Procedures: Echocardiography is a painless test that uses sound waves to create images of your heart. It provides your doctor with information about the size and shape of your heart and how well your heart's chambers and valves are working. This procedure takes approximately one hour. There are no restrictions for this procedure. [place] has requested that you have a lexiscan myoview. For further information please visit [email or web address]. Please follow instruction sheet, as given. [place]  Follow-Up: At [facility], you and your health needs are our priority.  As part of our continuing mission to provide you with exceptional heart care, we have created designated Provider Care Teams.  These Care Teams include your primary Cardiologist (physician) and Advanced Practice Providers (APPs -  Physician Assistants and Nurse Practitioners) who all work together to provide you with the care you need, when you need it.  We recommend signing up for the patient portal called "MyChart".  Sign up information is provided on this After Visit Summary.  MyChart  is used to connect with patients for Virtual Visits (Telemedicine).  Patients are able to view lab/test results, encounter notes, upcoming appointments, etc.  Non-urgent messages can be sent to your provider as well.   To learn more about what you can do with MyChart, go to [email or web address].    Your next appointment:   Based on test results   The format for your next appointment:   In Person  Provider:   Buford Dresser, MD  You are scheduled for a Myocardial Perfusion Imaging Study on Please arrive 15 minutes prior to your appointment time for registration and insurance purposes.  The test will take approximately 3 to 4 hours to complete; you may bring reading material.  If someone comes with you to your appointment, they will need to remain in the main lobby due to limited space in the testing area. **If you are pregnant or breastfeeding, please notify the nuclear lab prior to your appointment**  How to prepare for your Myocardial Perfusion Test: . Do not eat or drink 3 hours prior to your test, except you may have water. . Do not consume products containing caffeine (regular or decaffeinated) 12 hours prior to your test. (ex: coffee, chocolate, sodas, tea). . Do bring a list of your current medications with you.  If not listed below, you may take your medications as normal. . Do wear comfortable clothes  (no dresses or overalls) and walking shoes, tennis shoes preferred (No heels or open toe shoes are allowed). . Do NOT wear cologne, perfume, aftershave, or lotions (deodorant is allowed). . If these instructions are not followed, your test will have to be rescheduled.  Por favor presntese a la direccin [place], [address] para su examen.  Si tiene [place] inquietud o pregunta sobre su cita, puede llamar al Nuclear Lab at [phone].  If you cannot keep your appointment, please provide 24 hours notification to the Nuclear Lab, to avoid a possible $50 charge to your account.   Signed, Buford Dresser, MD PhD 06/06/2020 5:14 PM    [facility]

## 2020-06-11 ENCOUNTER — Ambulatory Visit: Payer: Medicaid Other

## 2020-06-12 ENCOUNTER — Ambulatory Visit: Payer: Medicaid Other | Admitting: Family Medicine

## 2020-06-18 ENCOUNTER — Encounter: Payer: Self-pay | Admitting: Family Medicine

## 2020-06-18 ENCOUNTER — Ambulatory Visit (INDEPENDENT_AMBULATORY_CARE_PROVIDER_SITE_OTHER): Payer: Medicaid Other | Admitting: Family Medicine

## 2020-06-18 ENCOUNTER — Ambulatory Visit: Payer: Medicaid Other | Admitting: Emergency Medicine

## 2020-06-18 ENCOUNTER — Other Ambulatory Visit: Payer: Self-pay

## 2020-06-18 VITALS — BP 169/71 | HR 69 | Temp 98.1°F | Ht 62.0 in | Wt 152.0 lb

## 2020-06-18 DIAGNOSIS — Z1231 Encounter for screening mammogram for malignant neoplasm of breast: Secondary | ICD-10-CM

## 2020-06-18 DIAGNOSIS — I152 Hypertension secondary to endocrine disorders: Secondary | ICD-10-CM

## 2020-06-18 DIAGNOSIS — M1990 Unspecified osteoarthritis, unspecified site: Secondary | ICD-10-CM

## 2020-06-18 DIAGNOSIS — E118 Type 2 diabetes mellitus with unspecified complications: Secondary | ICD-10-CM | POA: Diagnosis not present

## 2020-06-18 DIAGNOSIS — E1159 Type 2 diabetes mellitus with other circulatory complications: Secondary | ICD-10-CM

## 2020-06-18 DIAGNOSIS — Z23 Encounter for immunization: Secondary | ICD-10-CM | POA: Diagnosis not present

## 2020-06-18 DIAGNOSIS — M542 Cervicalgia: Secondary | ICD-10-CM

## 2020-06-18 MED ORDER — DICLOFENAC SODIUM 1 % EX GEL
4.0000 g | Freq: Four times a day (QID) | CUTANEOUS | 3 refills | Status: DC
Start: 2020-06-18 — End: 2022-01-09

## 2020-06-18 MED ORDER — LINAGLIPTIN 5 MG PO TABS: 5.0000 mg | ORAL_TABLET | Freq: Every day | ORAL | 0 refills | Status: DC

## 2020-06-18 NOTE — Progress Notes (Signed)
1/18/20222:39 PM  Rachael Burke 1955/12/22, 65 y.o., female [id number]  Chief Complaint  Patient presents with  . Follow-up    Low abd pain - is doing better due to less constipation   . Leg Pain    Nightly wakes up with pain     HPI:   Patient is a 65 y.o. female with past medical history significant for DM, CKD, HTN who presents today for follow up.  AV fistula placed Left arm started on Dialysis since last OV Dialysis M/W/F, 3.5 hours, taking off 1-2 liters Restricting fluids at home Last OV creatinine elevated and was having issues with constipation Started on Miralax, suppository and colace Still using miralax and colace daily Abdominal pain is much improved  Has pain on the back of her neck/head that radiates down her back This has been going on for awhile   HTN Amlodipine 10 mg Labetalol 200 mg bid Furosemide '80mg'$  bid BP Readings from Last 3 Encounters:  06/18/20 (!) 169/71  06/06/20 (!) 143/63  05/29/20 124/66     Depression screen PHQ 2/9 06/18/2020 05/15/2020 05/11/2019  Decreased Interest 0 0 0  Down, Depressed, Hopeless 0 0 0  PHQ - 2 Score 0 0 0  Altered sleeping - - -  Tired, decreased energy - - -  Change in appetite - - -  Feeling bad or failure about yourself  - - -  Trouble concentrating - - -  Moving slowly or fidgety/restless - - -  Suicidal thoughts - - -  PHQ-9 Score - - -    Fall Risk  06/18/2020 05/15/2020 05/11/2019 05/04/2019 04/06/2019  Falls in the past year? 0 1 0 0 0  Number falls in past yr: 0 0 - - -  Injury with Fall? 0 0 - - -  Follow up Falls evaluation completed Falls evaluation completed Falls evaluation completed Falls evaluation completed Falls evaluation completed     No Known Allergies  Prior to Admission medications   Medication Sig Start Date End Date Taking? Authorizing Provider  amLODipine (NORVASC) 10 MG tablet Take 10 mg by mouth daily.    [provider]  atorvastatin (LIPITOR) 20 MG  tablet Take 1 tablet (20 mg total) by mouth daily. 01/01/20   Charlynne Cousins, MD  bisacodyl (DULCOLAX) 10 MG suppository Place 1 suppository (10 mg total) rectally as needed for moderate constipation. 05/15/20   Lindel Marcell, Laurita Quint, FNP  clopidogrel (PLAVIX) 75 MG tablet Take 75 mg by mouth daily.    [provider]  docusate sodium (COLACE) 100 MG capsule Take 1-2 capsules (100-200 mg total) by mouth 2 (two) times daily. Start with twice a day once having regular BM switch to daily 05/15/20   Baker Moronta, Laurita Quint, FNP  ferrous sulfate 325 (65 FE) MG tablet Take 325 mg by mouth daily with breakfast.    [provider]  furosemide (LASIX) 80 MG tablet Take 1 tablet (80 mg total) by mouth 2 (two) times daily. 01/01/20   Charlynne Cousins, MD  labetalol (NORMODYNE) 200 MG tablet Take 200 mg by mouth 2 (two) times daily. 02/01/20   [provider]  linagliptin (TRADJENTA) 5 MG TABS tablet Take 1 tablet (5 mg total) by mouth daily. 01/01/20   Charlynne Cousins, MD  polyethylene glycol powder (GLYCOLAX/MIRALAX) 17 GM/SCOOP powder Take 17 g by mouth 2 (two) times daily as needed. 05/15/20   Lynnette Pote, Laurita Quint, FNP  sodium zirconium cyclosilicate (LOKELMA) 10 g PACK packet  Take 10 g by mouth 3 (three) times daily.    [provider]    Past Medical History:  Diagnosis Date  . Anemia   . Anxiety   . Arthritis   . Diabetes mellitus without complication ([place])   . Hyperlipidemia   . Hypertension   . Pneumonia   . PONV (postoperative nausea and vomiting)   . Renal disorder     Past Surgical History:  Procedure Laterality Date  . AV FISTULA PLACEMENT Left 05/20/2020   Procedure: INSERTION OF LEFT ARM ARTERIOVENOUS (AV) FISTULA;  Surgeon: Marty Heck, MD;  Location: [place];  Service: Vascular;  Laterality: Left;  . CESAREAN SECTION    . CHOLECYSTECTOMY    . INSERTION OF DIALYSIS CATHETER N/A 05/20/2020   Procedure: INSERTION OF DIALYSIS CATHETER;  Surgeon: Marty Heck, MD;  Location: [facility] OR;  Service: Vascular;  Laterality: N/A;    Social History   Tobacco Use  . Smoking status: Never Smoker  . Smokeless tobacco: Never Used  Substance Use Topics  . Alcohol use: Never    Family History  Problem Relation Age of Onset  . Diabetes Mother   . Hypertension Father   . Diabetes Sister   . Diabetes Brother   . Diabetes Brother     Review of Systems  Constitutional: Negative for chills, fever and malaise/fatigue.  Eyes: Negative for blurred vision and double vision.  Respiratory: Negative for cough, shortness of breath and wheezing.   Cardiovascular: Negative for chest pain, palpitations and leg swelling.  Gastrointestinal: Positive for constipation. Negative for abdominal pain, blood in stool, diarrhea, heartburn, nausea and vomiting.  Genitourinary: Negative for dysuria, frequency and hematuria.  Musculoskeletal: Positive for joint pain (bilateral knees). Negative for back pain.       Right pointer finger pain  Skin: Negative for rash.  Neurological: Negative for dizziness, weakness and headaches.     OBJECTIVE:  Today's Vitals   06/18/20 1349  BP: (!) 169/71  Pulse: 69  Temp: 98.1 F (36.7 C)  SpO2: 98%  Weight: 152 lb (68.9 kg)  Height: '5\' 2"'$  (1.575 m)   Body mass index is 27.8 kg/m.   Physical Exam Constitutional:      General: She is not in acute distress.    Appearance: Normal appearance. She is not ill-appearing.  HENT:     Head: Normocephalic.  Cardiovascular:     Rate and Rhythm: Normal rate and regular rhythm.     Pulses: Normal pulses.     Heart sounds: Normal heart sounds. No murmur heard. No friction rub. No gallop.   Pulmonary:     Effort: Pulmonary effort is normal. No respiratory distress.     Breath sounds: Normal breath sounds. No stridor. No wheezing, rhonchi or rales.  Abdominal:     General: Bowel sounds are normal. There is no distension.     Palpations: Abdomen is soft.     Tenderness:  There is no abdominal tenderness. There is no guarding or rebound.  Musculoskeletal:     Cervical back: Normal range of motion and neck supple. No rigidity or tenderness.     Right knee: Normal.     Left knee: Normal.     Right lower leg: No edema.     Left lower leg: No edema.  Lymphadenopathy:     Cervical: No cervical adenopathy.  Skin:    General: Skin is warm and dry.     Comments: AV fistula LUA healing well  Neurological:     Mental Status: She is alert and oriented to person, place, and time.  Psychiatric:        Mood and Affect: Mood normal.        Behavior: Behavior normal.     No results found for this or any previous visit (from the past 24 hour(s)).  No results found.   ASSESSMENT and PLAN  Problem List Items Addressed This Visit      Cardiovascular and Mediastinum   Hypertension associated with diabetes ([place])   Relevant Medications   linagliptin (TRADJENTA) 5 MG TABS tablet     Endocrine   Type 2 diabetes mellitus with complication, without long-term current use of insulin ([facility])   Relevant Medications   linagliptin (TRADJENTA) 5 MG TABS tablet    Other Visit Diagnoses    Encounter for screening mammogram for malignant neoplasm of breast    -  Primary   Relevant Orders   MM DIGITAL SCREENING BILATERAL   Encounter for immunization       Relevant Orders   Flu Vaccine QUAD High Dose(Fluad) (Completed)   Arthritis       Relevant Medications   diclofenac Sodium (VOLTAREN) 1 % GEL   Cervicalgia       Relevant Orders   Ambulatory referral to Physical Therapy      Plan   Schedule pap  Needs Mammogram  Physical therapy for neck and knee pain (volatren gel and tylenol)  F/u with nephrology about HTN  Flu shot today  Continue colace and miralax as bowel regimen for daily BM   Return in about 3 months (around 09/16/2020).    Huston Foley Aladdin Kollmann, FNP-BC [facility] [place], [place] [address] Ph.  [phone] Fax  [phone]

## 2020-06-18 NOTE — Patient Instructions (Addendum)
Joint Pain  Joint pain can be caused by many things. It is likely to go away if you follow instructions from your doctor for taking care of yourself at home. Sometimes, you may need more treatment. Follow these instructions at home: Managing pain, stiffness, and swelling  If told, put ice on the painful area. To do this: ? If you have a removable elastic bandage, sling, or splint, take it off as told by your doctor. ? Put ice in a plastic bag. ? Place a towel between your skin and the bag. ? Leave the ice on for 20 minutes, 2-3 times a day. ? Take off the ice if your skin turns bright red. This is very important. If you cannot feel pain, heat, or cold, you have a greater risk of damage to the area.  Move your fingers or toes below the painful joint often.  Raise the painful joint above the level of your heart while you are sitting or lying down.  If told, put heat on the painful area. Do this as often as told by your doctor. Use the heat source that your doctor recommends, such as a moist heat pack or a heating pad. ? Place a towel between your skin and the heat source. ? Leave the heat on for 20-30 minutes. ? Take off the heat if your skin gets bright red. This is especially important if you are unable to feel pain, heat, or cold. You may have a greater risk of getting burned.      Activity  Rest the painful joint for as long as told by your doctor. Do not do things that cause pain or make your pain worse.  Begin exercising or stretching the affected area, as told by your doctor. Ask your doctor what types of exercise are safe for you.  Return to your normal activities when your doctor says that it is safe. If you have an elastic bandage, sling, or splint:  Wear it as told by your doctor. Take it only as told by your doctor.  Loosen it your fingers or toes below the joint: ? Tingle. ? Become numb. ? Get cold and blue.  Keep it clean.  Ask your doctor if you should take  it off before bathing.  If it is not waterproof: ? Do not let it get wet. ? Cover it with a watertight covering when you take a bath or shower. General instructions  Take over-the-counter and prescription medicines only as told by your doctor. This may include medicines taken by mouth or applied to the skin.  Do not smoke or use any products that contain nicotine or tobacco. If you need help quitting, ask your doctor.  Keep all follow-up visits as told by your doctor. This is important. Contact a doctor if:  You have pain that gets worse and does not get better with medicine.  Your joint pain does not get better in 3 days.  You have more bruising or swelling.  You have a fever.  You lose 10 lb (4.5 kg) or more without trying. Get help right away if:  You cannot move the joint.  Your fingers or toes tingle, become numb. or get cold and blue.  You have a fever along with a joint that is red, warm, and swollen. Summary  Joint pain can be caused by many things. It often goes away if you follow instructions from your doctor for taking care of yourself at home.  Rest the  painful joint for as long as told. Do not do things that cause pain or make your pain worse.  Take over-the-counter and prescription medicines only as told by your doctor. This information is not intended to replace advice given to you by your health care provider. Make sure you discuss any questions you have with your health care provider. Document Revised: 08/30/2019 Document Reviewed: 08/30/2019 Elsevier Patient Education  2021 [place].  Chronic Kidney Disease, Adult Chronic kidney disease is when lasting damage happens to the kidneys slowly over a long time. The kidneys help to:  Make pee (urine).  Make hormones.  Keep the right amount of fluids and chemicals in the body. Most often, this disease does not go away. You must take steps to help keep the kidney damage from getting worse. If steps are  not taken, the kidneys might stop working forever. What are the causes?  Diabetes.  High blood pressure.  Diseases that affect the heart and blood vessels.  Other kidney diseases.  Diseases of the body's disease-fighting system.  A problem with the flow of pee.  Infections of the organs that make pee, store it, and take it out of the body.  Swelling or irritation of your blood vessels. What increases the risk?  Getting older.  Having someone in your family who has kidney disease or kidney failure.  Having a disease caused by genes.  Taking medicines often that harm the kidneys.  Being near or having contact with harmful substances.  Being very overweight.  Using tobacco now or in the past. What are the signs or symptoms?  Feeling very tired.  Having a swollen face, legs, ankles, or feet.  Feeling like you may vomit or vomiting.  Not feeling hungry.  Being confused or not able to focus.  Twitches and cramps in the leg muscles or other muscles.  Dry, itchy skin.  A taste of metal in your mouth.  Making less pee, or making more pee.  Shortness of breath.  Trouble sleeping. You may also become anemic or get weak bones. Anemic means there is not enough red blood cells or hemoglobin in your blood. You may get symptoms slowly. You may not notice them until the kidney damage gets very bad. How is this treated? Often, there is no cure for this disease. Treatment can help with symptoms and help keep the disease from getting worse. You may need to:  Avoid alcohol.  Avoid foods that are high in salt, potassium, phosphorous, and protein.  Take medicines for symptoms and to help control other conditions.  Have dialysis. This treatment gets harmful waste out of your body.  Treat other problems that cause your kidney disease or make it worse. Follow these instructions at home: Medicines  Take over-the-counter and prescription medicines only as told by your  doctor.  Do not take any new medicines, vitamins, or supplements unless your doctor says it is okay. Lifestyle  Do not smoke or use any products that contain nicotine or tobacco. If you need help quitting, ask your doctor.  If you drink alcohol: ? Limit how much you use to:  0-1 drink a day for women who are not pregnant.  0-2 drinks a day for men. ? Know how much alcohol is in your drink. In the [place] one drink equals one 12 oz bottle of beer (355 mL), one 5 oz glass of wine (148 mL), or one 1 oz glass of hard liquor (44 mL).  Stay at a healthy  weight. If you need help losing weight, ask your doctor.   General instructions  Follow instructions from your doctor about what you cannot eat or drink.  Track your blood pressure at home. Tell your doctor about any changes.  If you have diabetes, track your blood sugar.  Exercise at least 30 minutes a day, 5 days a week.  Keep your shots (vaccinations) up to date.  Keep all follow-up visits.   Where to find more information  American Association of Kidney Patients: BombTimer.gl  National Kidney Foundation: www.kidney.[place]: https://mathis.com/  Life Options: www.lifeoptions.org  Kidney School: www.kidneyschool.org Contact a doctor if:  Your symptoms get worse.  You get new symptoms. Get help right away if:  You get symptoms of end-stage kidney disease. These include: ? Headaches. ? Losing feeling in your hands or feet. ? Easy bruising. ? Having hiccups often. ? Chest pain. ? Shortness of breath. ? Lack of menstrual periods, in women.  You have a fever.  You make less pee than normal.  You have pain or you bleed when you pee or poop. These symptoms may be an emergency. Get help right away. Call your local emergency services (911 in the [place]  Do not wait to see if the symptoms will go away.  Do not drive yourself to the hospital. Summary  Chronic kidney disease is when lasting damage  happens to the kidneys slowly over a long time.  Causes of this disease include diabetes and high blood pressure.  Often, there is no cure for this disease. Treatment can help symptoms and help keep the disease from getting worse.  Treatment may involve lifestyle changes, medicines, and dialysis. This information is not intended to replace advice given to you by your health care provider. Make sure you discuss any questions you have with your health care provider. Document Revised: 08/23/2019 Document Reviewed: 08/23/2019 Elsevier Patient Education  2021 [employer].   If you have lab work done today you will be contacted with your lab results within the next 2 weeks.  If you have not heard from [place] then please contact us. The fastest way to get your results is to register for My Chart.   IF you received an x-ray today, you will receive an invoice from [facility]. Please contact [facility] at [phone] with questions or concerns regarding your invoice.   IF you received labwork today, you will receive an invoice from [facility]. Please contact [facility] at [phone] with questions or concerns regarding your invoice.   Our billing staff will not be able to assist you with questions regarding bills from these companies.  You will be contacted with the lab results as soon as they are available. The fastest way to get your results is to activate your My Chart account. Instructions are located on the last page of this paperwork. If you have not heard from [place] regarding the results in 2 weeks, please contact this office.

## 2020-06-20 ENCOUNTER — Ambulatory Visit (INDEPENDENT_AMBULATORY_CARE_PROVIDER_SITE_OTHER): Payer: Self-pay | Admitting: Physician Assistant

## 2020-06-20 ENCOUNTER — Other Ambulatory Visit: Payer: Self-pay

## 2020-06-20 ENCOUNTER — Ambulatory Visit (HOSPITAL_COMMUNITY)
Admission: RE | Admit: 2020-06-20 | Discharge: 2020-06-20 | Disposition: A | Discharge status: Home / Self Care | Payer: Medicaid Other | Source: Ambulatory Visit | Attending: Physician Assistant | Admitting: Physician Assistant

## 2020-06-20 ENCOUNTER — Encounter (HOSPITAL_COMMUNITY): Payer: Medicaid Other

## 2020-06-20 VITALS — BP 165/70 | HR 72 | Temp 98.1°F | Resp 20 | Ht 62.0 in | Wt 153.2 lb

## 2020-06-20 DIAGNOSIS — Z992 Dependence on renal dialysis: Secondary | ICD-10-CM

## 2020-06-20 DIAGNOSIS — N186 End stage renal disease: Secondary | ICD-10-CM | POA: Diagnosis not present

## 2020-06-20 NOTE — Progress Notes (Signed)
POST OPERATIVE OFFICE NOTE    CC:  F/u for surgery  HPI:  This is a 65 y.o. female who is s/p leftfirst stage basilic vein   On [id number]   by Dr. Carlis Abbott.    Pt returns today for follow up.  She denise loss of grip, loss sensation and pain.    No Known Allergies  Current Outpatient Medications  Medication Sig Dispense Refill  . amLODipine (NORVASC) 10 MG tablet Take 10 mg by mouth daily.    Marland Kitchen atorvastatin (LIPITOR) 20 MG tablet Take 1 tablet (20 mg total) by mouth daily. 30 tablet 0  . bisacodyl (DULCOLAX) 10 MG suppository Place 1 suppository (10 mg total) rectally as needed for moderate constipation. 12 suppository 0  . clopidogrel (PLAVIX) 75 MG tablet Take 75 mg by mouth daily.    . diclofenac Sodium (VOLTAREN) 1 % GEL Apply 4 g topically 4 (four) times daily. 50 g 3  . docusate sodium (COLACE) 100 MG capsule Take 1-2 capsules (100-200 mg total) by mouth 2 (two) times daily. Start with twice a day once having regular BM switch to daily 60 capsule 3  . ferrous sulfate 325 (65 FE) MG tablet Take 325 mg by mouth daily with breakfast.    . furosemide (LASIX) 80 MG tablet Take 1 tablet (80 mg total) by mouth 2 (two) times daily. 60 tablet 3  . labetalol (NORMODYNE) 200 MG tablet Take 200 mg by mouth 2 (two) times daily.    Marland Kitchen linagliptin (TRADJENTA) 5 MG TABS tablet Take 1 tablet (5 mg total) by mouth daily. 30 tablet 0  . polyethylene glycol powder (GLYCOLAX/MIRALAX) 17 GM/SCOOP powder Take 17 g by mouth 2 (two) times daily as needed. 3350 g 3  . sodium zirconium cyclosilicate (LOKELMA) 10 g PACK packet Take 10 g by mouth 3 (three) times daily.     No current facility-administered medications for this visit.     ROS:  See HPI  Physical Exam: Findings:  +--------------------+----------+-----------------+--------+  AVF         PSV (cm/s)Flow Vol (mL/min)Comments  +--------------------+----------+-----------------+--------+  Native artery inflow  275      908           +--------------------+----------+-----------------+--------+  AVF Anastomosis     817                 +--------------------+----------+-----------------+--------+     +------------+----------+-------------+----------+-------------------------  -+  OUTFLOW VEINPSV (cm/s)Diameter (cm)Depth (cm)     Describe        +------------+----------+-------------+----------+-------------------------  -+  Prox UA     111    0.64     1.20        p-m         +------------+----------+-------------+----------+-------------------------  -+  Mid UA     111    0.70     1.15                  +------------+----------+-------------+----------+-------------------------  -+  Dist UA     600    0.45     0.85  appers to be soft  thrombus  +------------+----------+-------------+----------+-------------------------  -+  AC Fossa    856    0.34     0.69                  +------------+----------+-------------+----------+-------------------------  -+    Summary:  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 There appears to be soft thrombus in the distal  outflow vein in the distal upper arm.     Left UE Incision:  Incision healing well Extremities:  Palpable thrill at [facility] anastomosis, palpable radial pulse, grip 5/5   Assessment/Plan:  This is a 65 y.o. female who is s/p:leftfirst stage basilic vein     The distal half of the fistula is small with soft thrombus.  The proximal 1/2 is larger than 0.6 cm in diameter.  She is on Plavix.  We will repeat the duplex in 3-4 weeks.  She will activity use the left arm to help develop the fistula.  If it matures well by the next visit we can schedule her for transposition second stage basilic.   I did ask Dr. Scot Dock about the presence of thrombus in  the basilic vein and he said she can treat it with antiplatelets and f/u repeat duplex.    Roxy Horseman PA-C [facility] [phone]  Clinic MD:  Scot Dock

## 2020-06-21 ENCOUNTER — Other Ambulatory Visit: Payer: Self-pay

## 2020-06-21 ENCOUNTER — Telehealth (HOSPITAL_COMMUNITY): Payer: Self-pay | Admitting: *Deleted

## 2020-06-21 DIAGNOSIS — Z992 Dependence on renal dialysis: Secondary | ICD-10-CM

## 2020-06-21 NOTE — Telephone Encounter (Signed)
Close encounter 

## 2020-06-23 ENCOUNTER — Other Ambulatory Visit: Payer: Self-pay

## 2020-06-23 ENCOUNTER — Emergency Department (HOSPITAL_COMMUNITY)
Admission: EM | Admit: 2020-06-23 | Discharge: 2020-06-23 | Disposition: A | Discharge status: Home / Self Care | Payer: Medicaid Other | Attending: Emergency Medicine | Admitting: Emergency Medicine

## 2020-06-23 DIAGNOSIS — Z5321 Procedure and treatment not carried out due to patient leaving prior to being seen by health care provider: Secondary | ICD-10-CM | POA: Diagnosis not present

## 2020-06-23 DIAGNOSIS — R0602 Shortness of breath: Secondary | ICD-10-CM | POA: Diagnosis not present

## 2020-06-23 DIAGNOSIS — R509 Fever, unspecified: Secondary | ICD-10-CM | POA: Diagnosis present

## 2020-06-23 DIAGNOSIS — M549 Dorsalgia, unspecified: Secondary | ICD-10-CM | POA: Diagnosis not present

## 2020-06-23 DIAGNOSIS — R519 Headache, unspecified: Secondary | ICD-10-CM | POA: Diagnosis not present

## 2020-06-23 DIAGNOSIS — M791 Myalgia, unspecified site: Secondary | ICD-10-CM | POA: Diagnosis not present

## 2020-06-23 DIAGNOSIS — Z20822 Contact with and (suspected) exposure to covid-19: Secondary | ICD-10-CM | POA: Diagnosis not present

## 2020-06-23 LAB — SARS CORONAVIRUS 2 BY RT PCR (HOSPITAL ORDER, PERFORMED IN ~~LOC~~ HOSPITAL LAB): SARS Coronavirus 2: NEGATIVE

## 2020-06-23 NOTE — ED Triage Notes (Signed)
Patient reportedly started feeling bad yesterday. Patient has COVID symptoms (Body aches, fevers, and headaches).  Patient has had her COVID vaccines.

## 2020-06-23 NOTE — ED Notes (Signed)
Patient had dialysis on Friday and 1,700 mls pulled off.

## 2020-06-24 ENCOUNTER — Telehealth: Payer: Medicaid Other | Admitting: Family Medicine

## 2020-06-24 ENCOUNTER — Ambulatory Visit: Payer: Medicaid Other | Admitting: Family Medicine

## 2020-06-25 ENCOUNTER — Ambulatory Visit (HOSPITAL_COMMUNITY)
Admission: RE | Admit: 2020-06-25 | Discharge: 2020-06-25 | Disposition: A | Discharge status: Home / Self Care | Payer: Medicaid Other | Source: Ambulatory Visit | Attending: Cardiovascular Disease | Admitting: Cardiovascular Disease

## 2020-06-25 ENCOUNTER — Other Ambulatory Visit: Payer: Self-pay

## 2020-06-25 ENCOUNTER — Ambulatory Visit: Payer: Medicaid Other | Admitting: Family Medicine

## 2020-06-25 DIAGNOSIS — R079 Chest pain, unspecified: Secondary | ICD-10-CM | POA: Insufficient documentation

## 2020-06-25 DIAGNOSIS — R072 Precordial pain: Secondary | ICD-10-CM | POA: Diagnosis not present

## 2020-06-25 LAB — MYOCARDIAL PERFUSION IMAGING
LV dias vol: 121 mL (ref 46–106)
LV sys vol: 55 mL
Peak HR: 83 {beats}/min
Rest HR: 68 {beats}/min
SDS: 10
SRS: 4
SSS: 14
TID: 1.09

## 2020-06-25 MED ORDER — AMINOPHYLLINE 25 MG/ML IV SOLN
75.0000 mg | Freq: Once | INTRAVENOUS | Status: AC
  Administered 2020-06-25: 75 mg via INTRAVENOUS

## 2020-06-25 MED ORDER — REGADENOSON 0.4 MG/5ML IV SOLN
0.4000 mg | Freq: Once | INTRAVENOUS | Status: AC
  Administered 2020-06-25: 0.4 mg via INTRAVENOUS

## 2020-06-25 MED ORDER — TECHNETIUM TC 99M TETROFOSMIN IV KIT
11.0000 | PACK | Freq: Once | INTRAVENOUS | Status: AC | PRN
  Administered 2020-06-25: 11 via INTRAVENOUS
  Filled 2020-06-25: qty 11

## 2020-06-25 MED ORDER — TECHNETIUM TC 99M TETROFOSMIN IV KIT
32.0000 | PACK | Freq: Once | INTRAVENOUS | Status: AC | PRN
  Administered 2020-06-25: 32 via INTRAVENOUS
  Filled 2020-06-25: qty 32

## 2020-06-27 ENCOUNTER — Other Ambulatory Visit: Payer: Self-pay

## 2020-06-27 ENCOUNTER — Emergency Department (HOSPITAL_COMMUNITY)
Admission: EM | Admit: 2020-06-27 | Discharge: 2020-06-27 | Disposition: A | Discharge status: Home / Self Care | Payer: Medicaid Other | Attending: Emergency Medicine | Admitting: Emergency Medicine

## 2020-06-27 ENCOUNTER — Encounter (HOSPITAL_COMMUNITY): Payer: Self-pay | Admitting: Emergency Medicine

## 2020-06-27 ENCOUNTER — Emergency Department (HOSPITAL_COMMUNITY): Payer: Medicaid Other

## 2020-06-27 DIAGNOSIS — E119 Type 2 diabetes mellitus without complications: Secondary | ICD-10-CM | POA: Diagnosis not present

## 2020-06-27 DIAGNOSIS — R0603 Acute respiratory distress: Secondary | ICD-10-CM | POA: Diagnosis not present

## 2020-06-27 DIAGNOSIS — R0602 Shortness of breath: Secondary | ICD-10-CM | POA: Diagnosis not present

## 2020-06-27 DIAGNOSIS — R062 Wheezing: Secondary | ICD-10-CM | POA: Diagnosis not present

## 2020-06-27 DIAGNOSIS — Z20822 Contact with and (suspected) exposure to covid-19: Secondary | ICD-10-CM | POA: Diagnosis not present

## 2020-06-27 DIAGNOSIS — R059 Cough, unspecified: Secondary | ICD-10-CM | POA: Diagnosis not present

## 2020-06-27 DIAGNOSIS — I120 Hypertensive chronic kidney disease with stage 5 chronic kidney disease or end stage renal disease: Secondary | ICD-10-CM | POA: Diagnosis not present

## 2020-06-27 DIAGNOSIS — R0902 Hypoxemia: Secondary | ICD-10-CM | POA: Diagnosis not present

## 2020-06-27 DIAGNOSIS — N186 End stage renal disease: Secondary | ICD-10-CM | POA: Diagnosis not present

## 2020-06-27 DIAGNOSIS — Z7984 Long term (current) use of oral hypoglycemic drugs: Secondary | ICD-10-CM | POA: Diagnosis not present

## 2020-06-27 DIAGNOSIS — Z79899 Other long term (current) drug therapy: Secondary | ICD-10-CM | POA: Diagnosis not present

## 2020-06-27 DIAGNOSIS — I10 Essential (primary) hypertension: Secondary | ICD-10-CM | POA: Diagnosis not present

## 2020-06-27 DIAGNOSIS — Z992 Dependence on renal dialysis: Secondary | ICD-10-CM | POA: Diagnosis not present

## 2020-06-27 DIAGNOSIS — I959 Hypotension, unspecified: Secondary | ICD-10-CM | POA: Diagnosis not present

## 2020-06-27 DIAGNOSIS — I509 Heart failure, unspecified: Secondary | ICD-10-CM | POA: Diagnosis not present

## 2020-06-27 DIAGNOSIS — R051 Acute cough: Secondary | ICD-10-CM | POA: Diagnosis not present

## 2020-06-27 LAB — CBC WITH DIFFERENTIAL/PLATELET
Abs Immature Granulocytes: 0.02 10*3/uL (ref 0.00–0.07)
Basophils Absolute: 0 10*3/uL (ref 0.0–0.1)
Basophils Relative: 0 %
Eosinophils Absolute: 0.1 10*3/uL (ref 0.0–0.5)
Eosinophils Relative: 2 %
HCT: 31.1 % — ABNORMAL LOW (ref 36.0–46.0)
Hemoglobin: 9.1 g/dL — ABNORMAL LOW (ref 12.0–15.0)
Immature Granulocytes: 0 %
Lymphocytes Relative: 11 %
Lymphs Abs: 0.6 10*3/uL — ABNORMAL LOW (ref 0.7–4.0)
MCH: 26.1 pg (ref 26.0–34.0)
MCHC: 29.3 g/dL — ABNORMAL LOW (ref 30.0–36.0)
MCV: 89.4 fL (ref 80.0–100.0)
Monocytes Absolute: 0.3 10*3/uL (ref 0.1–1.0)
Monocytes Relative: 5 %
Neutro Abs: 4.9 10*3/uL (ref 1.7–7.7)
Neutrophils Relative %: 82 %
Platelets: 86 10*3/uL — ABNORMAL LOW (ref 150–400)
RBC: 3.48 MIL/uL — ABNORMAL LOW (ref 3.87–5.11)
RDW: 14.8 % (ref 11.5–15.5)
WBC: 6 10*3/uL (ref 4.0–10.5)
nRBC: 0 % (ref 0.0–0.2)

## 2020-06-27 LAB — BASIC METABOLIC PANEL
Anion gap: 8 (ref 5–15)
BUN: 15 mg/dL (ref 8–23)
CO2: 22 mmol/L (ref 22–32)
Calcium: 7.8 mg/dL — ABNORMAL LOW (ref 8.9–10.3)
Chloride: 108 mmol/L (ref 98–111)
Creatinine, Ser: 3.82 mg/dL — ABNORMAL HIGH (ref 0.44–1.00)
GFR, Estimated: 13 mL/min — ABNORMAL LOW (ref >60)
Glucose, Bld: 146 mg/dL — ABNORMAL HIGH (ref 70–99)
Potassium: 5 mmol/L (ref 3.5–5.1)
Sodium: 138 mmol/L (ref 135–145)

## 2020-06-27 LAB — TROPONIN I (HIGH SENSITIVITY)
Troponin I (High Sensitivity): 8 ng/L (ref <18)
Troponin I (High Sensitivity): 8 ng/L (ref <18)

## 2020-06-27 LAB — BRAIN NATRIURETIC PEPTIDE: B Natriuretic Peptide: 456 pg/mL — ABNORMAL HIGH (ref 0.0–100.0)

## 2020-06-27 LAB — SARS CORONAVIRUS 2 BY RT PCR (HOSPITAL ORDER, PERFORMED IN ~~LOC~~ HOSPITAL LAB): SARS Coronavirus 2: NEGATIVE

## 2020-06-27 MED ORDER — BENZONATATE 100 MG PO CAPS: 100.0000 mg | ORAL_CAPSULE | Freq: Three times a day (TID) | ORAL | 0 refills | Status: DC

## 2020-06-27 MED ORDER — CHLORHEXIDINE GLUCONATE CLOTH 2 % EX PADS: 6.0000 | MEDICATED_PAD | Freq: Every day | CUTANEOUS | Status: DC

## 2020-06-27 MED ORDER — HEPARIN SODIUM (PORCINE) 1000 UNIT/ML IJ SOLN
INTRAMUSCULAR | Status: AC
  Administered 2020-06-27: 3800 [IU] via INTRAVENOUS
  Filled 2020-06-27: qty 4

## 2020-06-27 MED ORDER — HEPARIN SODIUM (PORCINE) 1000 UNIT/ML IJ SOLN: 1000.0000 [IU] | INTRAMUSCULAR | Status: DC

## 2020-06-27 NOTE — ED Triage Notes (Signed)
Pt here from [facility] with c/o sob , pt was seen at  [facility]  4 days ago for same , ptr received  Rocephin decadron and albuterol at the [facility]

## 2020-06-27 NOTE — ED Provider Notes (Addendum)
[facility] EMERGENCY DEPARTMENT Provider Note   CSN: [id number] Arrival date & time: 06/27/20  1314     History No chief complaint on file.   Rachael Burke is a 65 y.o. female.  The history is provided by the patient (Daughter). The history is limited by a language barrier.  Shortness of Breath Severity:  Moderate Onset quality:  Gradual Duration:  5 days Timing:  Constant Progression:  Worsening Chronicity:  Recurrent Relieved by:  Sitting up Exacerbated by: lying flat. Ineffective treatments:  None tried Associated symptoms: cough   Associated symptoms: no abdominal pain, no chest pain, no ear pain, no fever, no rash, no sore throat and no vomiting   Risk factors: no family hx of DVT, no hx of cancer and no oral contraceptive use         Past Medical History:  Diagnosis Date  . Anemia   . Anxiety   . Arthritis   . Diabetes mellitus without complication ([place])   . Hyperlipidemia   . Hypertension   . Pneumonia   . PONV (postoperative nausea and vomiting)   . Renal disorder     Patient Active Problem List   Diagnosis Date Noted  . Acute renal failure superimposed on chronic kidney disease ([place]) 12/29/2019  . Anemia associated with chronic renal failure 12/29/2019  . Hyperkalemia 12/29/2019  . Symptomatic anemia 04/08/2019  . HLD (hyperlipidemia) 04/08/2019  . AKI (acute kidney injury) ([place]) 04/07/2019  . Hypertension associated with diabetes ([place]) 04/06/2019  . Type 2 diabetes mellitus with complication, without long-term current use of insulin ([place]) 01/04/2018    Past Surgical History:  Procedure Laterality Date  . AV FISTULA PLACEMENT Left 05/20/2020   Procedure: INSERTION OF LEFT ARM ARTERIOVENOUS (AV) FISTULA;  Surgeon: Marty Heck, MD;  Location: [facility];  Service: Vascular;  Laterality: Left;  . CESAREAN SECTION    . CHOLECYSTECTOMY    . INSERTION OF DIALYSIS CATHETER N/A 05/20/2020   Procedure: INSERTION OF  DIALYSIS CATHETER;  Surgeon: Marty Heck, MD;  Location: [facility];  Service: Vascular;  Laterality: N/A;     OB History   No obstetric history on file.     Family History  Problem Relation Age of Onset  . Diabetes Mother   . Hypertension Father   . Diabetes Sister   . Diabetes Brother   . Diabetes Brother     Social History   Tobacco Use  . Smoking status: Never Smoker  . Smokeless tobacco: Never Used  Vaping Use  . Vaping Use: Never used  Substance Use Topics  . Alcohol use: Never  . Drug use: Never    Home Medications Prior to Admission medications   Medication Sig Start Date End Date Taking? Authorizing Provider  amLODipine (NORVASC) 10 MG tablet Take 10 mg by mouth daily.    [provider]  atorvastatin (LIPITOR) 20 MG tablet Take 1 tablet (20 mg total) by mouth daily. 01/01/20   Charlynne Cousins, MD  bisacodyl (DULCOLAX) 10 MG suppository Place 1 suppository (10 mg total) rectally as needed for moderate constipation. 05/15/20   Just, Laurita Quint, FNP  clopidogrel (PLAVIX) 75 MG tablet Take 75 mg by mouth daily.    [provider]  diclofenac Sodium (VOLTAREN) 1 % GEL Apply 4 g topically 4 (four) times daily. 06/18/20   Just, Laurita Quint, FNP  docusate sodium (COLACE) 100 MG capsule Take 1-2 capsules (100-200 mg total) by mouth 2 (two) times  daily. Start with twice a day once having regular BM switch to daily 05/15/20   Just, Laurita Quint, FNP  ferrous sulfate 325 (65 FE) MG tablet Take 325 mg by mouth daily with breakfast.    [provider]  furosemide (LASIX) 80 MG tablet Take 1 tablet (80 mg total) by mouth 2 (two) times daily. 01/01/20   Charlynne Cousins, MD  labetalol (NORMODYNE) 200 MG tablet Take 200 mg by mouth 2 (two) times daily. 02/01/20   [provider]  linagliptin (TRADJENTA) 5 MG TABS tablet Take 1 tablet (5 mg total) by mouth daily. 06/18/20   Just, Laurita Quint, FNP  polyethylene glycol powder (GLYCOLAX/MIRALAX) 17 GM/SCOOP  powder Take 17 g by mouth 2 (two) times daily as needed. 05/15/20   Just, Laurita Quint, FNP  sodium zirconium cyclosilicate (LOKELMA) 10 g PACK packet Take 10 g by mouth 3 (three) times daily.    [provider]    Allergies    Patient has no known allergies.  Review of Systems   Review of Systems  Constitutional: Negative for chills and fever.  HENT: Negative for ear pain and sore throat.   Eyes: Negative for pain and visual disturbance.  Respiratory: Positive for cough and shortness of breath.   Cardiovascular: Negative for chest pain and palpitations.  Gastrointestinal: Negative for abdominal pain and vomiting.  Genitourinary: Negative for dysuria and hematuria.  Musculoskeletal: Negative for arthralgias and back pain.  Skin: Negative for color change and rash.  Neurological: Negative for seizures and syncope.  All other systems reviewed and are negative.   Physical Exam Updated Vital Signs BP (!) 173/68 (BP Location: Left Arm)   Pulse 80   Temp 98.4 F (36.9 C) (Oral)   Resp (!) 22   Ht '5\' 2"'$  (1.575 m)   Wt 69.7 kg   SpO2 98%   BMI 28.10 kg/m   Physical Exam Vitals and nursing note reviewed.  Constitutional:      General: She is not in acute distress.    Appearance: She is well-developed and well-nourished.  HENT:     Head: Normocephalic and atraumatic.  Eyes:     Conjunctiva/sclera: Conjunctivae normal.  Cardiovascular:     Rate and Rhythm: Normal rate and regular rhythm.     Heart sounds: No murmur heard.     Comments: L bicep AV fistula with good thrill Pulmonary:     Effort: Pulmonary effort is normal. Tachypnea present. No respiratory distress.     Breath sounds: Rales present.  Abdominal:     Palpations: Abdomen is soft.     Tenderness: There is no abdominal tenderness.  Musculoskeletal:        General: No edema.     Cervical back: Neck supple.  Skin:    General: Skin is warm and dry.  Neurological:     Mental Status: She is alert.   Psychiatric:        Mood and Affect: Mood and affect normal.     ED Results / Procedures / Treatments   Labs (all labs ordered are listed, but only abnormal results are displayed) Labs Reviewed  BASIC METABOLIC PANEL - Abnormal; Notable for the following components:      Result Value   Glucose, Bld 146 (*)    Creatinine, Ser 3.82 (*)    Calcium 7.8 (*)    GFR, Estimated 13 (*)    All other components within normal limits  CBC WITH DIFFERENTIAL/PLATELET - Abnormal; Notable for the  following components:   RBC 3.48 (*)    Hemoglobin 9.1 (*)    HCT 31.1 (*)    MCHC 29.3 (*)    Platelets 86 (*)    Lymphs Abs 0.6 (*)    All other components within normal limits  BRAIN NATRIURETIC PEPTIDE - Abnormal; Notable for the following components:   B Natriuretic Peptide 456.0 (*)    All other components within normal limits  SARS CORONAVIRUS 2 BY RT PCR (HOSPITAL ORDER, [facility])  TROPONIN I (HIGH SENSITIVITY)  TROPONIN I (HIGH SENSITIVITY)    EKG EKG Interpretation  Date/Time:  Thursday June 27 2020 13:39:37 EST Ventricular Rate:  66 PR Interval:  180 QRS Duration: 78 QT Interval:  438 QTC Calculation: 459 R Axis:   -6 Text Interpretation: Normal sinus rhythm Anterolateral infarct , age undetermined Abnormal ECG No significant change since last tracing Confirmed by Wandra Arthurs [id number]) on 06/27/2020 5:46:27 PM   Radiology DG Chest Port 1 View  Result Date: 06/27/2020 CLINICAL DATA:  Shortness of breath.  Cough. EXAM: PORTABLE CHEST 1 VIEW COMPARISON:  05/28/2020 FINDINGS: Right chest wall dialysis catheter is noted with tip at the cavoatrial junction. There is aortic atherosclerosis. Stable cardiac enlargement. There is heterogeneous interstitial opacities within the periphery of both lungs, nonspecific. No lobar consolidation or pleural effusion identified. IMPRESSION: 1. Bilateral heterogeneous interstitial opacities which may reflect interstitial  edema versus multifocal atypical viral infection. 2. Cardiac enlargement and aortic atherosclerosis. Electronically Signed   By: Kerby Moors M.D.   On: 06/27/2020 13:42    Procedures Procedures   Medications Ordered in ED Medications  Chlorhexidine Gluconate Cloth 2 % PADS 6 each (has no administration in time range)  heparin sodium (porcine) injection 1,000 Units (3,800 Units Intravenous Given 06/27/20 2123)    ED Course  I have reviewed the triage vital signs and the nursing notes.  Pertinent labs & imaging results that were available during my care of the patient were reviewed by me and considered in my medical decision making (see chart for details).    MDM Rules/Calculators/A&P                          This is a 65 year old female with a past medical history of end-stage renal disease on hemodialysis Monday Wednesday Friday, hypertension, diabetes, who presents emergency department for evaluation of cough and shortness of breath.  Reportedly this cough and shortness of breath started over the past weekend, 5 days ago, family thought this was related to the flu.  She went to her dialysis session on Monday and felt better, she also had dialysis yesterday and shortly after started feeling more short of breath again.  She had a negative Covid test on Sunday when she came to the emergency department but left without treatment, and also was negative today again.  On exam she is afebrile, mildly hypertensive, no acute distress.  She is slightly tachypneic and is on 2 L nasal cannula which is not her baseline.  Normally she is on room air, apparently she was seen in urgent care and became hypoxic on ambulation. She was given rocephin, decadron, and albuterol at urgent care, unfortunately we do not have access to these records.  She has no calf asymmetry or significant calf tenderness, she does complain of some generalized body aches and entire bilateral lower extremity pain which is likely  secondary to some hypervolemia and fluid retention. Acute DVT/PE  is not consistent with her presentation. No focal pneumonia, no fevers, no leukocytosis concerning for PNA.   Her labs are relatively reassuring with anemia and hemoglobin at baseline, no significant leukocytosis.  Her BNP is elevated to 456 but appears to be down from 566 1 month ago.  Of note, patient did have a full cardiac work-up including a myocardial perfusion with Lexiscan stress test on 1/25 which showed mildly reduced EF between 45-55 percent and no wall motion abnormalities.  Her clinical picture, x-ray, elevated BNP, and oxygen requirement appears to be secondary to hypervolemia. Given her symptoms are recurring and then improve with dialysis, I believe her SOB is due to volume. Although she did complete dialysis yesterday, I believe she would benefit from some additional dialysis today, I spoke with Dr. Joylene Grapes, nephrology who agreed to dialyze the patient tonight. Following dialysis we will plan to have the patient return to the emergency department for reevaluation. If the patient reports that her shortness of breath has improved, she is no longer requiring oxygen, and she is not tachypneic, I believe that she is stable and appropriate for discharge home.  Patient did return to the emergency department following dialysis, she was taken off the oxygen and maintained her saturations greater than 94% on room air.  She is no longer significantly tachypneic, with no increased work of breathing.  Her symptoms are improved after her extra dialysis session.  We will plan to discharge her home with instructions to still go to dialysis tomorrow for her regular session, strict return precautions if she develops chest pain, recurrence of shortness of breath, fevers, or any other emergent concern.  Final Clinical Impression(s) / ED Diagnoses Final diagnoses:  ESRD on dialysis [facility])    Rx / DC Orders ED Discharge Orders    None          Camila Li, MD 06/27/20 2259    Drenda Freeze, MD 06/27/20 [phone]

## 2020-06-27 NOTE — Discharge Instructions (Addendum)
Please follow-up with your primary care physician, also please go to dialysis as scheduled.  Return to the emergency department if you develop chest pain, recurrence of shortness of breath, or any other emergent concern.

## 2020-06-27 NOTE — ED Triage Notes (Signed)
Emergency Medicine Provider Triage Evaluation Note  Rachael Burke , a 65 y.o. female  was evaluated in triage.  Pt complains of chest pain, shortness of breath, cough for the past 5 days. Patient was seen in urgent care, brought in by EMS because patient desatted upon ambulation. Negative Covid test 4 days ago. Urgent care started patient on Rocephin, Decadron and albuterol.  Came in on 2 L, was able to take patient off of this and she remained above 97%.  Review of Systems  Positive:         Shortness of breath, cough, chest pain earlier Negative: ?  Physical Exam  There were no vitals taken for this visit. Gen:   Patient appears jaundiced, appears chronically ill. HEENT:  Atraumatic  Resp:  Multiple rhonchi heard throughout. Cardiac:  Normal rate  Abd:   Nondistended, nontender  MSK:   Moves extremities without difficulty   Medical Decision Making  Medically screening exam initiated at 1:18 PM.  Appropriate orders placed.  Rachael Burke was informed that the remainder of the evaluation will be completed by another provider, this initial triage assessment does not replace that evaluation, and the importance of remaining in the ED until their evaluation is complete.  Clinical Impression  65 year old female coming in with chest pain, shortness of breath and cough.  Patient is currently chest pain-free.  EKG without any St elevations.  Did receive Decadron, Rocephin and albuterol in urgent care.  Pt appears stable. Multiple rhonchi heard throughout all lung fields. Do suspect CHF exacerbation, will order basic labs at this time.    Rachael Client, PA-C 06/27/20 1343

## 2020-06-27 NOTE — Progress Notes (Signed)
65 yo F who recently started dialysis presents w/ SOB and hypoxia. Mild requiring 2L. Has not missed dialysis.  [facility] Will do 2hr dialysis session now removing 2L and she should go to dialysis again tomorrow outpatient.

## 2020-06-27 NOTE — ED Notes (Signed)
Pt has dry cough onset today. Shob worsened overnight. Pt does dialysis MWF. Pt's daughter reports pt feels less shob after dialysis. Pt has bilateral crackles in lower lobes and a dry cough. Pt appears ill.

## 2020-06-28 ENCOUNTER — Telehealth: Payer: Self-pay

## 2020-06-28 NOTE — Telephone Encounter (Signed)
Transition Care Management Follow-up Telephone Call  Date of discharge and from where: 06/27/2020 from [facility]  How have you been since you were released from the hospital? Pt is feeling better and is currently at the dialysis center.   Any questions or concerns? No  Items Reviewed:  Did the pt receive and understand the discharge instructions provided? Yes   Medications obtained and verified? Yes   Other? No   Any new allergies since your discharge? No   Dietary orders reviewed? Yes  Do you have support at home? Yes   Functional Questionnaire: (I = Independent and D = Dependent) ADLs: I mostly. Patient gets dizzy at times and needs assistant when that happens.   Bathing/Dressing- I  Meal Prep- I  Eating- I  Maintaining continence- I  Transferring/Ambulation- I  Managing Meds- I   Follow up appointments reviewed:    Are transportation arrangements needed? No   If their condition worsens, is the pt aware to call PCP or go to the Emergency Dept.? Yes  Was the patient provided with contact information for the PCP's office or ED? Yes  Was to pt encouraged to call back with questions or concerns? Yes

## 2020-07-01 DIAGNOSIS — Z992 Dependence on renal dialysis: Secondary | ICD-10-CM | POA: Diagnosis not present

## 2020-07-01 DIAGNOSIS — N186 End stage renal disease: Secondary | ICD-10-CM | POA: Diagnosis not present

## 2020-07-01 DIAGNOSIS — E1129 Type 2 diabetes mellitus with other diabetic kidney complication: Secondary | ICD-10-CM | POA: Diagnosis not present

## 2020-07-02 ENCOUNTER — Ambulatory Visit (HOSPITAL_COMMUNITY): Payer: Medicaid Other | Attending: Internal Medicine

## 2020-07-02 ENCOUNTER — Other Ambulatory Visit: Payer: Self-pay

## 2020-07-02 DIAGNOSIS — R0600 Dyspnea, unspecified: Secondary | ICD-10-CM | POA: Diagnosis not present

## 2020-07-02 DIAGNOSIS — R011 Cardiac murmur, unspecified: Secondary | ICD-10-CM

## 2020-07-02 LAB — ECHOCARDIOGRAM COMPLETE
AR max vel: 1.94 cm2
AV Area VTI: 1.79 cm2
AV Area mean vel: 1.82 cm2
AV Mean grad: 8 mmHg
AV Peak grad: 17.2 mmHg
Ao pk vel: 2.08 m/s
Area-P 1/2: 3.53 cm2
S' Lateral: 3.2 cm

## 2020-07-16 ENCOUNTER — Ambulatory Visit: Payer: Medicaid Other | Admitting: Cardiology

## 2020-07-17 ENCOUNTER — Telehealth: Payer: Self-pay

## 2020-07-17 NOTE — Telephone Encounter (Signed)
Pt.'s daughter called back to inform the practice they had never received a call for the pt's referral. Documentation shows the physical therapist attempting to call and unable to leave a VM. Pt.'s daughter requested referral be re-entered. Good contact number [phone]

## 2020-07-17 NOTE — Telephone Encounter (Signed)
Pt.'s contact number [phone]

## 2020-07-18 NOTE — Telephone Encounter (Signed)
[phone]  [phone] Ortho number, called daughter back and provided her with this so she is able to call back and schedule

## 2020-07-23 ENCOUNTER — Other Ambulatory Visit: Payer: Self-pay

## 2020-07-23 ENCOUNTER — Encounter: Payer: Self-pay | Admitting: Emergency Medicine

## 2020-07-23 ENCOUNTER — Ambulatory Visit (INDEPENDENT_AMBULATORY_CARE_PROVIDER_SITE_OTHER): Payer: Medicaid Other | Admitting: Emergency Medicine

## 2020-07-23 VITALS — BP 152/65 | HR 74 | Temp 97.9°F | Resp 16 | Ht 62.0 in | Wt 147.0 lb

## 2020-07-23 DIAGNOSIS — Z992 Dependence on renal dialysis: Secondary | ICD-10-CM | POA: Diagnosis not present

## 2020-07-23 DIAGNOSIS — D631 Anemia in chronic kidney disease: Secondary | ICD-10-CM | POA: Diagnosis not present

## 2020-07-23 DIAGNOSIS — F411 Generalized anxiety disorder: Secondary | ICD-10-CM | POA: Diagnosis not present

## 2020-07-23 DIAGNOSIS — N186 End stage renal disease: Secondary | ICD-10-CM | POA: Diagnosis not present

## 2020-07-23 DIAGNOSIS — E785 Hyperlipidemia, unspecified: Secondary | ICD-10-CM | POA: Diagnosis not present

## 2020-07-23 DIAGNOSIS — I152 Hypertension secondary to endocrine disorders: Secondary | ICD-10-CM | POA: Diagnosis not present

## 2020-07-23 DIAGNOSIS — E1159 Type 2 diabetes mellitus with other circulatory complications: Secondary | ICD-10-CM | POA: Diagnosis not present

## 2020-07-23 DIAGNOSIS — N189 Chronic kidney disease, unspecified: Secondary | ICD-10-CM

## 2020-07-23 MED ORDER — SITAGLIPTIN PHOSPHATE 25 MG PO TABS: 25.0000 mg | ORAL_TABLET | Freq: Every day | ORAL | 3 refills | Status: DC

## 2020-07-23 MED ORDER — ESCITALOPRAM OXALATE 10 MG PO TABS: 10.0000 mg | ORAL_TABLET | Freq: Every day | ORAL | 1 refills | Status: DC

## 2020-07-23 NOTE — Patient Instructions (Addendum)
If you have lab work done today you will be contacted with your lab results within the next 2 weeks.  If you have not heard from [place] then please contact us. The fastest way to get your results is to register for My Chart.   IF you received an x-ray today, you will receive an invoice from [facility]. Please contact [facility] at [phone] with questions or concerns regarding your invoice.   IF you received labwork today, you will receive an invoice from [facility]. Please contact [facility] at [phone] with questions or concerns regarding your invoice.   Our billing staff will not be able to assist you with questions regarding bills from these companies.  You will be contacted with the lab results as soon as they are available. The fastest way to get your results is to activate your My Chart account. Instructions are located on the last page of this paperwork. If you have not heard from [place] regarding the results in 2 weeks, please contact this office.     [email or web address]  Trastorno de ansiedad generalizada, en adultos Generalized Anxiety Disorder, Adult El trastorno de ansiedad generalizada (TAG) es una afeccin de salud mental. A diferencia de las preocupaciones normales, la ansiedad relacionada con el TAG no se desencadena por un acontecimiento especfico. Estas preocupaciones no desaparecen ni mejoran con el tiempo. EL TAG interfiere con las relaciones, el trabajo y [place]. Los sntomas del TAG pueden variar de leves a graves. Las personas con TAG grave pueden tener intensas oleadas de ansiedad con sntomas fsicos que son similares a las crisis de [place]. Cules son las causas? Se desconoce la causa exacta del TAG, pero se cree que influyen los siguientes factores:  Diferencias en las sustancias qumicas naturales del cerebro.  Genes que se transmiten de padres a hijos.  Diferencias en la forma en que se perciben las amenazas.  El desarrollo  durante la infancia.  La personalidad. Qu incrementa el riesgo? Los siguientes factores pueden hacer que sea ms propenso a Emergency planning/management officer afeccin:  Ser mujer.  Tener antecedentes familiares de trastornos de ansiedad.  Ser muy tmido.  Experimentar acontecimientos muy estresantes en la vida, como la muerte de un ser querido.  Tener un entorno Training and development officer. Cules son los signos o sntomas? Con frecuencia, las personas que padecen el TAG se preocupan excesivamente por muchas cosas en la vida, tales como su salud y [place]. Otros sntomas son:  Heritage manager y emocionales: ? Preocupacin excesiva acerca de los desastres naturales. ? [place] a llegar tarde. ? Dificultad para concentrarse. ? Temor de que otras personas juzguen su desempeo.  Sntomas fsicos: ? Fatiga. ? Dolores de cabeza, tensin muscular, contracciones musculares, temblores o sensacin de tambalearse. ? Sensacin de que el corazn late [place] rpido. ? Sentir falta de aire o como que no se puede respirar profundamente. ? Problemas para conciliar el sueo o para seguir durmiendo, o sensacin de agitacin. ? Sudoracin. ? Nuseas, diarrea o sndrome del intestino irritable (SII).  Sntomas de la conducta: ? Tener estados de nimo cambiantes o irritabilidad. ? Evitar situaciones nuevas. ? Evitar a Public affairs consultant. ? Dificultad extrema para tomar decisiones. Cmo se diagnostica? Esta afeccin se diagnostica en funcin de los sntomas y los antecedentes mdicos. Adems, se le Chartered certified accountant examen fsico. El mdico puede hacerle algunas pruebas para descartar que sus sntomas tengan otras causas. Para recibir un diagnstico del TAG, una persona debe tener ansiedad que:  Est fuera de su  control.  Afecte distintos aspectos de su vida, como el trabajo y [employer].  Cause angustia que le impida participar en sus actividades habituales.  Incluya al menos tres sntomas del TAG, tales como  desasosiego, fatiga, dificultad para concentrarse, irritabilidad, tensin muscular o problemas para dormir. Antes de que su mdico pueda confirmar el diagnstico de TAG, estos sntomas deben estar presentes ms [employer] no lo estn y deben tener una duracin de seis meses o ms. Cmo se trata? El tratamiento de esta afeccin puede incluir:  Medicamentos. Por lo general, los medicamentos antidepresivos se recetan para un control diario a Barrister's clerk. Se pueden agregar medicamentos para la ansiedad en casos graves, especialmente cuando ocurren crisis de [place].  Psicoterapia (psicoanlisis). Determinados tipos de psicoterapia pueden ser tiles para tratar el TAG al brindar 4, educacin y orientacin. [place] opciones se incluyen las siguientes: ? Terapia cognitivo conductual (TCC). Las personas aprenden habilidades para afrontar situaciones y tcnicas para poder calmarse para aliviar sus sntomas fsicos. Aprenden a identificar comportamientos y pensamientos no realistas y a reemplazarlos por comportamientos y pensamientos ms adecuados. ? Terapia de aceptacin y compromiso (acceptance and commitment therapy, ACT). Este tratamiento ensea a las personas a ser conscientes como una forma de lidiar con pensamientos y sentimientos no deseados. ? Biorretroalimentacin. Este proceso lo capacita para Aeronautical engineer respuesta del cuerpo Insurance risk surveyor) a travs de tcnicas de respiracin y mtodos de relajacin. Usted trabajar con un terapeuta mientras se usan mquinas para controlar sus sntomas fsicos.  Tcnicas para Scientific laboratory technician. Estas incluyen yoga, meditacin y ejercicio. Un especialista en salud mental puede ayudar a determinar qu tratamiento es el mejor para usted. Algunas Water quality scientist con solo un tipo de [place]. Sin embargo, Producer, television/film/video requieren una combinacin de terapias.   Siga estas instrucciones en su casa: Estilo de vida  Mantenga horarios y [place]  rutina uniforme.  [place] situaciones estresantes. Michelene Gardener un plan y reserve tiempo extra para trabajar con su plan.  Practique tcnicas de control del estrs o tcnicas para calmarse que su terapeuta o su mdico le haya enseado. Instrucciones generales  [employer] de venta libre y los recetados solamente como se lo haya indicado el mdico.  Comprenda que es probable que tenga retrocesos. Acepte esto y sea amable con usted mismo mientras contina cuidndose mejor.  Reconozca y acepte sus logros, aunque le parezcan pequeos.  Concurra a todas las visitas de seguimiento como se lo haya indicado el mdico. Esto es importante. Comunquese con un mdico si:  Los sntomas no mejoran.  Sus sntomas empeoran.  Tiene signos de depresin tales como: ? Un estado de nimo constantemente triste o irritable. ? Ya no disfruta de Engineering geologist. ? Cambios en el peso o en sus hbitos de alimentacin. ? Cambios en los hbitos de sueo. ? Evita a amigos y familiares. ? No tiene energa para realizar las tareas habituales. ? Tiene sentimientos de culpa o de inutilidad. Solicite ayuda de inmediato si:  Tiene pensamientos serios acerca de lastimarse a usted mismo o a Producer, television/film/video. Si alguna vez siente que puede lastimarse o Market researcher a Producer, television/film/video, o tiene pensamientos de poner fin a su vida, busque ayuda de inmediato. Dirjase al servicio de urgencias ms cercano o:  Comunquese con el servicio de emergencias de su localidad (911 en los [place]).  Llame a una lnea de asistencia al suicida y atencin en crisis [place] ([employer]  [place]) al [phone]. Est disponible las 24 horas del da en los [place]  Enve un mensaje de texto a la lnea para casos de crisis al [phone] (en los [place].). Resumen  El trastorno de ansiedad generalizada (TAG) es una afeccin de salud mental que implica  preocupacin no desencadenada por un acontecimiento especfico.  Con frecuencia, las personas que padecen el TAG se preocupan excesivamente por muchas cosas en la vida, tales como su salud y [place].  El TAG puede causar sntomas tales como agitacin, dificultad para concentrarse, problemas para dormir, sudoracin frecuente, nuseas, diarrea, dolores de [place] y temblores o contracciones musculares.  Un especialista en salud mental puede ayudar a determinar qu tratamiento es el mejor para usted. Algunas Water quality scientist con solo un tipo de [place]. Sin embargo, Producer, television/film/video requieren una combinacin de terapias. Esta informacin no tiene Marine scientist el consejo del mdico. Asegrese de hacerle al mdico cualquier pregunta que tenga. Document Revised: 05/30/2019 Document Reviewed: 05/30/2019 Elsevier Patient Education  2021 [place].  [email or web address]  Transtorno de ansiedade generalizada, adultos Generalized Anxiety Disorder, Adult O transtorno de ansiedade generalizada (TAG)  um problema de sade mental. Diferentemente de preocupaes normais, a ansiedade relacionada ao TAG no  disparada por um evento especfico. Essa ansiedade no diminui ou melhora com o tempo. O TAG interfere nos relacionamentos, no trabalho e na escola. Os sintomas de TAG podem variar de leve a grave. Pessoas com TAG grave podem ter episdios intensos de ansiedade com sintomas fsicos similares a ataques de pnico. Quais so as causas? A causa exata do TAG  desconhecida, mas acredita-se que os seguintes aspectos tm influncia:  Diferenas nas substncias qumicas naturais no crebro.  Genes transmitidos dos pais para os filhos.  Diferenas na [place] as ameaas so percebidas.  Desenvolvimento durante a infncia.  Personalidade. O que aumenta o risco? Os fatores a Surveyor, quantity suscetvel a desenvolver esse quadro clnico:  Sexo feminino.  Ter um histrico  familiar de transtornos de ansiedade.  Ser muito tmido.  Passar por eventos muito estressantes na vida, como a morte de [employer].  Viver em [employer] Information systems manager. Quais so os sinais ou sintomas? Pessoas com TAG geralmente se preocupam em excesso com muitas coisas em suas vidas, como sua sade e sua famlia. Os sintomas podem incluir tambm:  Sintomas mentais e emocionais: ? Preocupar-se excessivamente com desastres naturais. ? Medo de se atrasar. ? Dificuldade de concentrao. ? Ter receio de que os outros esto julgando seu desempenho.  Sintomas fsicos: ? Fadiga. ? Dores de cabea, tenso muscular, espasmos musculares, tremores ou sentir-se trmulo. ? Sentir seu corao palpitando ou batendo muito rpido. ? Sentir falta de ar ou como se no conseguisse respirar fundo. ? Dificuldade para pegar no sono ou manter o sono profundo, ou sentir inquietao. ? Sudorese. ? Enjoo, diarreia ou sndrome do intestino irritvel (SII).  Sintomas comportamentais: ? Apresentar humor irregular ou irritabilidade. ? Evitar novas situaes. ? Evitar pessoas. ? Dificuldade extrema para tomar decises. Como esse quadro clnico  diagnosticado? Esse quadro clnico  diagnosticado com base nos seus sintomas e histrico mdico. Voc tambm passar por um exame fsico. O seu mdico poder realizar testes para descartar outras causas possveis de seus sintomas. Para ser diagnosticado com TAG, uma pessoa precisa sofrer de uma ansiedade que:  [place] fora de controle dele ou dela.  Afete diversos aspectos diferentes de sua vida, como trabalho e relacionamentos.  Cause um nvel de  angstia que o torne incapaz de participar de atividades normais.  Inclua pelo menos trs sintomas do TAG, como inquietao, fadiga, dificuldade de concentrao, irritabilidade, tenso muscular ou dificuldade para dormir. Antes que seu mdico possa confirmar um diagnstico de TAG, esses sintomas devem  estar presentes na maioria dos dias e devem durar 6 meses ou [place]. Como esse quadro clnico  tratado? Esse quadro clnico pode ser tratado com:  Medicamentos. Medicamentos antidepressivos em geral so prescritos para o controle dirio de Advertising account planner. Medicamentos para ansiedade podem ser [employer] graves, especialmente quando ocorrem ataques de pnico.  Terapia pela fala (psicoterapia). Certos tipos de terapia pela fala podem ser teis no tratamento do TAG, ao proporcionarem apoio, informaes e orientao. As opes incluem: ? Terapia cognitivo-comportamental (TCC). As pessoas aprendem habilidades de enfrentamento e tcnicas autocalmantes para aliviar os sintomas fsicos. Aprendem a identificar pensamentos e comportamentos pouco realistas e a substitu-los por pensamentos e comportamentos mais adequados. ? Terapia de aceitao e compromisso (TAC). Esse tratamento ensina s pessoas como a conscincia pode ser uma forma de lidar com pensamentos e sentimentos indesejados. ? Biofeedback. Esse processo  um treinamento para que voc Building control surveyor as Actor de seu corpo (respostas fisiolgicas) por meio de tcnicas de respirao e mtodos de relaxamento. Voc trabalhar com um terapeuta enquanto mquinas so usadas para monitorar seus sintomas fsicos.  Tcnicas de controle do estresse. Incluem ioga, meditao e exerccio. Um especialista em sade mental poder determinar qual tratamento  melhor para voc. Algumas pessoas obtm melhoras com um nico tratamento. No entanto, outras pessoas necessitam de uma combinao de tratamentos.   Siga essas instrues em casa: Estilo de vida  Mantenha uma rotina e agenda regulares.  Preveja situaes estressantes. Elabore um plano e permita um tempo para colocar seu plano em prtica.  Pratique as tcnicas de controle do estresse ou autocalmantes que voc aprendeu com seu mdico ou terapeuta. Instrues gerais  Tome medicamentos vendidos com ou  sem receita mdica somente de acordo com as indicaes do seu mdico.  Entenda que  provvel que voc sofra recadas. Aceite isso e seja gentil consigo mesmo, ao mesmo tempo em que persiste em cuidar melhor de si mesmo.  Reconhea e aceite suas conquistas, mesmo que voc as julgue como pequenas.  Comparea a todas as consultas de acompanhamento de acordo com as orientaes do seu mdico. Isso  importante. Entre em contato com um mdico se:  Seus sintomas no melhorarem.  Seus sintomas piorarem.  Voc tiver sinais de depresso, como: ? Um humor persistentemente triste ou irritvel. ? Perda de prazer em atividades que sempre foram prazerosas. ? Apresentar alterao no peso ou no apetite. ? Alterao no sono. ? Evitar os amigos ou familiares. ? Falta de energia em tarefas normais. ? Sensao de culpa ou falta de valor. [place] ajuda imediatamente se:  Considerar seriamente a ideia de fazer mal a si mesmo ou a outras pessoas. Se sentir vontade de ferir a si mesmo ou outras pessoas ou pensar em tirar a prpria vida, procure ajuda imediatamente. V para o pronto-socorro mais prximo ou:  Ligue para o nmero de Restaurant manager, fast food (911, nos EUA).  Ligue para um servio telefnico de preveno do suicdio, como o National Suicide Prevention Lifeline ([place]), no nmero [phone]. Funciona 24 horas por dia nos EUA.  Envie uma mensagem de texto para o servio de preveno de crises em [phone] (nos EUA). Resumo  O transtorno da ansiedade generalizada (TAG)  um problema  de sade mental que envolve um tipo de ansiedade que no  causado por um evento especfico.  Pessoas com TAG geralmente se preocupam em excesso com muitas coisas em [employer], como sua sade e sua famlia.  O TAG pode causar sintomas como inquietao, dificuldade de concentrao, problemas para dormir, transpirao frequente, enjoo, diarreia, dor de cabea e tremedeira ou  espasmos musculares.  Um especialista em sade mental poder determinar qual tratamento  melhor para voc. Algumas pessoas obtm melhoras com um nico tratamento. No entanto, outras pessoas necessitam de uma combinao de tratamentos. Estas informaes no se destinam a substituir as recomendaes de seu mdico. No deixe de discutir quaisquer dvidas com seu mdico. Document Revised: 05/30/2019 Document Reviewed: 05/30/2019 Elsevier Patient Education  2021 [employer].

## 2020-07-23 NOTE — Progress Notes (Signed)
Rachael Burke 65 y.o.   Chief Complaint  Patient presents with  . Anxiety    Per patient it has been 3 years since I have been like this and I have not been given medicine,  . Foot Pain    Both - Per patient they hurt at night and I rub them and it has been like this for 3 years    HISTORY OF PRESENT ILLNESS: This is a 65 y.o. female with history of hypertension and end-stage renal disease on dialysis complaining of generalized anxiety for the past 3 years.  Requesting medication. Also complaining of chronic leg cramps for the last 3 years. No other complaints or medical concerns today.  HPI   Prior to Admission medications   Medication Sig Start Date End Date Taking? Authorizing Provider  amLODipine (NORVASC) 10 MG tablet Take 10 mg by mouth daily.   Yes [provider]  atorvastatin (LIPITOR) 20 MG tablet Take 1 tablet (20 mg total) by mouth daily. 01/01/20  Yes Charlynne Cousins, MD  bisacodyl (DULCOLAX) 10 MG suppository Place 1 suppository (10 mg total) rectally as needed for moderate constipation. 05/15/20  Yes Just, Laurita Quint, FNP  diclofenac Sodium (VOLTAREN) 1 % GEL Apply 4 g topically 4 (four) times daily. 06/18/20  Yes Just, Laurita Quint, FNP  docusate sodium (COLACE) 100 MG capsule Take 1-2 capsules (100-200 mg total) by mouth 2 (two) times daily. Start with twice a day once having regular BM switch to daily 05/15/20  Yes Just, Laurita Quint, FNP  furosemide (LASIX) 80 MG tablet Take 1 tablet (80 mg total) by mouth 2 (two) times daily. 01/01/20  Yes Charlynne Cousins, MD  linagliptin (TRADJENTA) 5 MG TABS tablet Take 1 tablet (5 mg total) by mouth daily. 06/18/20  Yes Just, Laurita Quint, FNP  polyethylene glycol powder (GLYCOLAX/MIRALAX) 17 GM/SCOOP powder Take 17 g by mouth 2 (two) times daily as needed. 05/15/20  Yes Just, Laurita Quint, FNP  clopidogrel (PLAVIX) 75 MG tablet Take 75 mg by mouth daily. Patient not taking: Reported on 07/23/2020    [provider]   ferrous sulfate 325 (65 FE) MG tablet Take 325 mg by mouth daily with breakfast.    [provider]  labetalol (NORMODYNE) 200 MG tablet Take 200 mg by mouth 2 (two) times daily. Patient not taking: Reported on 07/23/2020 02/01/20   [provider]  sodium zirconium cyclosilicate (LOKELMA) 10 g PACK packet Take 10 g by mouth 3 (three) times daily.    [provider]    No Known Allergies  Patient Active Problem List   Diagnosis Date Noted  . Acute renal failure superimposed on chronic kidney disease ([place]) 12/29/2019  . Anemia associated with chronic renal failure 12/29/2019  . Symptomatic anemia 04/08/2019  . HLD (hyperlipidemia) 04/08/2019  . AKI (acute kidney injury) ([place]) 04/07/2019  . Hypertension associated with diabetes ([place]) 04/06/2019  . Type 2 diabetes mellitus with complication, without long-term current use of insulin ([place]) 01/04/2018    Past Medical History:  Diagnosis Date  . Anemia   . Anxiety   . Arthritis   . Diabetes mellitus without complication ([place])   . Hyperlipidemia   . Hypertension   . Pneumonia   . PONV (postoperative nausea and vomiting)   . Renal disorder     Past Surgical History:  Procedure Laterality Date  . AV FISTULA PLACEMENT Left 05/20/2020   Procedure: INSERTION OF LEFT ARM ARTERIOVENOUS (AV) FISTULA;  Surgeon: Carlis Abbott,  Gwenyth Allegra, MD;  Location: [facility];  Service: Vascular;  Laterality: Left;  . CESAREAN SECTION    . CHOLECYSTECTOMY    . INSERTION OF DIALYSIS CATHETER N/A 05/20/2020   Procedure: INSERTION OF DIALYSIS CATHETER;  Surgeon: Marty Heck, MD;  Location[facility];  Service: Vascular;  Laterality: N/A;    Social History   Socioeconomic History  . Marital status: Married    Spouse name: Not on file  . Number of children: Not on file  . Years of education: Not on file  . Highest education level: Not on file  Occupational History  . Not on file  Tobacco Use  . Smoking status: Never Smoker  .  Smokeless tobacco: Never Used  Vaping Use  . Vaping Use: Never used  Substance and Sexual Activity  . Alcohol use: Never  . Drug use: Never  . Sexual activity: Not on file  Other Topics Concern  . Not on file  Social History Narrative  . Not on file   Social Determinants of Health   Financial Resource Strain: Not on file  Food Insecurity: Not on file  Transportation Needs: Not on file  Physical Activity: Not on file  Stress: Not on file  Social Connections: Not on file  Intimate Partner Violence: Not on file    Family History  Problem Relation Age of Onset  . Diabetes Mother   . Hypertension Father   . Diabetes Sister   . Diabetes Brother   . Diabetes Brother      Review of Systems  Constitutional: Negative.  Negative for chills and fever.  HENT: Negative.  Negative for congestion.   Respiratory: Negative.  Negative for cough and shortness of breath.   Cardiovascular: Negative.  Negative for chest pain and palpitations.  Gastrointestinal: Negative.  Negative for abdominal pain, diarrhea, nausea and vomiting.  Skin: Negative.  Negative for rash.  Neurological: Negative for dizziness and headaches.  All other systems reviewed and are negative.    Physical Exam Vitals reviewed.  Constitutional:      Appearance: Normal appearance.  HENT:     Head: Normocephalic.  Eyes:     Extraocular Movements: Extraocular movements intact.     Pupils: Pupils are equal, round, and reactive to light.  Cardiovascular:     Rate and Rhythm: Normal rate and regular rhythm.     Pulses: Normal pulses.     Heart sounds: Normal heart sounds.  Pulmonary:     Effort: Pulmonary effort is normal.     Breath sounds: Normal breath sounds.  Musculoskeletal:        General: Normal range of motion.     Cervical back: Normal range of motion.     Comments: Lower extremities: Neurovascularly intact.  No erythema or swelling.  No tenderness.  Normal DTRs.  Skin:    General: Skin is warm and  dry.     Capillary Refill: Capillary refill takes less than 2 seconds.  Neurological:     General: No focal deficit present.     Mental Status: She is alert and oriented to person, place, and time.  Psychiatric:        Mood and Affect: Mood normal.        Behavior: Behavior normal.      ASSESSMENT & PLAN: Rachael Burke was seen today for anxiety and foot pain.  Diagnoses and all orders for this visit:  Hypertension associated with diabetes ([place]) -     sitaGLIPtin (JANUVIA) 25 MG tablet;  Take 1 tablet (25 mg total) by mouth daily.  Anemia associated with chronic renal failure  Hyperlipidemia, unspecified hyperlipidemia type  Generalized anxiety disorder -     escitalopram (LEXAPRO) 10 MG tablet; Take 1 tablet (10 mg total) by mouth daily.  End stage renal disease on dialysis [facility])    Patient Instructions       If you have lab work done today you will be contacted with your lab results within the next 2 weeks.  If you have not heard from [place] then please contact us. The fastest way to get your results is to register for My Chart.   IF you received an x-ray today, you will receive an invoice from [facility]. Please contact [facility] at [phone] with questions or concerns regarding your invoice.   IF you received labwork today, you will receive an invoice from [place]. Please contact [facility] at [phone] with questions or concerns regarding your invoice.   Our billing staff will not be able to assist you with questions regarding bills from these companies.  You will be contacted with the lab results as soon as they are available. The fastest way to get your results is to activate your My Chart account. Instructions are located on the last page of this paperwork. If you have not heard from [place] regarding the results in 2 weeks, please contact this office.     [email or web address]  Trastorno de ansiedad generalizada, en adultos Generalized Anxiety  Disorder, Adult El trastorno de ansiedad generalizada (TAG) es una afeccin de salud mental. A diferencia de las preocupaciones normales, la ansiedad relacionada con el TAG no se desencadena por un acontecimiento especfico. Estas preocupaciones no desaparecen ni mejoran con el tiempo. EL TAG interfiere con las relaciones, el trabajo y [place]. Los sntomas del TAG pueden variar de leves a graves. Las personas con TAG grave pueden tener intensas oleadas de ansiedad con sntomas fsicos que son similares a las crisis de [place]. Cules son las causas? Se desconoce la causa exacta del TAG, pero se cree que influyen los siguientes factores:  Diferencias en las sustancias qumicas naturales del cerebro.  Genes que se transmiten de padres a hijos.  Diferencias en la forma en que se perciben las amenazas.  El desarrollo durante la infancia.  La personalidad. Qu incrementa el riesgo? Los siguientes factores pueden hacer que sea ms propenso a Emergency planning/management officer afeccin:  Ser mujer.  Tener antecedentes familiares de trastornos de ansiedad.  Ser muy tmido.  Experimentar acontecimientos muy estresantes en la vida, como la muerte de un ser querido.  Tener un entorno Training and development officer. Cules son los signos o sntomas? Con frecuencia, las personas que padecen el TAG se preocupan excesivamente por muchas cosas en la vida, tales como su salud y [place]. Otros sntomas son:  Heritage manager y emocionales: ? Preocupacin excesiva acerca de los desastres naturales. ? [place] a llegar tarde. ? Dificultad para concentrarse. ? Temor de que otras personas juzguen su desempeo.  Sntomas fsicos: ? Fatiga. ? Dolores de cabeza, tensin muscular, contracciones musculares, temblores o sensacin de tambalearse. ? Sensacin de que el corazn late [place] rpido. ? Sentir falta de aire o como que no se puede respirar profundamente. ? Problemas para conciliar el sueo o para seguir durmiendo,  o sensacin de agitacin. ? Sudoracin. ? Nuseas, diarrea o sndrome del intestino irritable (SII).  Sntomas de la conducta: ? Tener estados de nimo cambiantes o irritabilidad. ? Evitar situaciones nuevas. ? Evitar a las  personas. ? Dificultad extrema para tomar decisiones. Cmo se diagnostica? Esta afeccin se diagnostica en funcin de los sntomas y los antecedentes mdicos. Adems, se le Chartered certified accountant examen fsico. El mdico puede hacerle algunas pruebas para descartar que sus sntomas tengan otras causas. Para recibir un diagnstico del TAG, una persona debe tener ansiedad que:  Est fuera de su control.  Afecte distintos aspectos de su vida, como el trabajo y [employer].  Cause angustia que le impida participar en sus actividades habituales.  Incluya al menos tres sntomas del TAG, tales como desasosiego, fatiga, dificultad para concentrarse, irritabilidad, tensin muscular o problemas para dormir. Antes de que su mdico pueda confirmar el diagnstico de TAG, estos sntomas deben estar presentes ms [employer] no lo estn y deben tener una duracin de seis meses o ms. Cmo se trata? El tratamiento de esta afeccin puede incluir:  Medicamentos. Por lo general, los medicamentos antidepresivos se recetan para un control diario a Barrister's clerk. Se pueden agregar medicamentos para la ansiedad en casos graves, especialmente cuando ocurren crisis de [place].  Psicoterapia (psicoanlisis). Determinados tipos de psicoterapia pueden ser tiles para tratar el TAG al brindar 41, educacin y orientacin. [place] opciones se incluyen las siguientes: ? Terapia cognitivo conductual (TCC). Las personas aprenden habilidades para afrontar situaciones y tcnicas para poder calmarse para aliviar sus sntomas fsicos. Aprenden a identificar comportamientos y pensamientos no realistas y a reemplazarlos por comportamientos y pensamientos ms adecuados. ? Terapia de aceptacin y compromiso  (acceptance and commitment therapy, ACT). Este tratamiento ensea a las personas a ser conscientes como una forma de lidiar con pensamientos y sentimientos no deseados. ? Biorretroalimentacin. Este proceso lo capacita para Aeronautical engineer respuesta del cuerpo Insurance risk surveyor) a travs de tcnicas de respiracin y mtodos de relajacin. Usted trabajar con un terapeuta mientras se usan mquinas para controlar sus sntomas fsicos.  Tcnicas para Scientific laboratory technician. Estas incluyen yoga, meditacin y ejercicio. Un especialista en salud mental puede ayudar a determinar qu tratamiento es el mejor para usted. Algunas Water quality scientist con solo un tipo de [place]. Sin embargo, Producer, television/film/video requieren una combinacin de terapias.   Siga estas instrucciones en su casa: Estilo de vida  Mantenga horarios y [place] rutina uniforme.  [place] situaciones estresantes. Michelene Gardener un plan y reserve tiempo extra para trabajar con su plan.  Practique tcnicas de control del estrs o tcnicas para calmarse que su terapeuta o su mdico le haya enseado. Instrucciones generales  [employer] de venta libre y los recetados solamente como se lo haya indicado el mdico.  Comprenda que es probable que tenga retrocesos. Acepte esto y sea amable con usted mismo mientras contina cuidndose mejor.  Reconozca y acepte sus logros, aunque le parezcan pequeos.  Concurra a todas las visitas de seguimiento como se lo haya indicado el mdico. Esto es importante. Comunquese con un mdico si:  Los sntomas no mejoran.  Sus sntomas empeoran.  Tiene signos de depresin tales como: ? Un estado de nimo constantemente triste o irritable. ? Ya no disfruta de Engineering geologist. ? Cambios en el peso o en sus hbitos de alimentacin. ? Cambios en los hbitos de sueo. ? Evita a amigos y familiares. ? No tiene energa para realizar las tareas habituales. ? Tiene sentimientos de culpa  o de inutilidad. Solicite ayuda de inmediato si:  Tiene pensamientos serios acerca de lastimarse a usted mismo o a Producer, television/film/video. Si alguna vez siente que puede lastimarse  o lastimar a Producer, television/film/video, o tiene pensamientos de poner fin a su vida, busque ayuda de inmediato. Dirjase al servicio de urgencias ms cercano o:  Comunquese con el servicio de emergencias de su localidad (911 en los [place]).  Llame a una lnea de asistencia al suicida y atencin en crisis como National Suicide Prevention Lifeline ([place]) al [phone]. Est disponible las 24 horas del da en los EE.[place].  Enve un mensaje de texto a la lnea para casos de crisis al [phone] (en los EE.UU.). Resumen  El trastorno de ansiedad generalizada (TAG) es una afeccin de salud mental que implica preocupacin no desencadenada por un acontecimiento especfico.  Con frecuencia, las personas que padecen el TAG se preocupan excesivamente por muchas cosas en la vida, tales como su salud y [place].  El TAG puede causar sntomas tales como agitacin, dificultad para concentrarse, problemas para dormir, sudoracin frecuente, nuseas, diarrea, dolores de [place] y temblores o contracciones musculares.  Un especialista en salud mental puede ayudar a determinar qu tratamiento es el mejor para usted. Algunas Water quality scientist con solo un tipo de [place]. Sin embargo, Producer, television/film/video requieren una combinacin de terapias. Esta informacin no tiene Marine scientist el consejo del mdico. Asegrese de hacerle al mdico cualquier pregunta que tenga. Document Revised: 05/30/2019 Document Reviewed: 05/30/2019 Elsevier Patient Education  2021 [place].  [email or web address]  Transtorno de ansiedade generalizada, adultos Generalized Anxiety Disorder, Adult O transtorno de ansiedade generalizada (TAG)  um problema de sade mental. Diferentemente de preocupaes normais, a ansiedade  relacionada ao TAG no  disparada por um evento especfico. Essa ansiedade no diminui ou melhora com o tempo. O TAG interfere nos relacionamentos, no trabalho e na escola. Os sintomas de TAG podem variar de leve a grave. Pessoas com TAG grave podem ter episdios intensos de ansiedade com sintomas fsicos similares a ataques de pnico. Quais so as causas? A causa exata do TAG  desconhecida, mas acredita-se que os seguintes aspectos tm influncia:  Diferenas nas substncias qumicas naturais no crebro.  Genes transmitidos dos pais para os filhos.  Diferenas na [place] as ameaas so percebidas.  Desenvolvimento durante a infncia.  Personalidade. O que aumenta o risco? Os fatores a Surveyor, quantity suscetvel a desenvolver esse quadro clnico:  Sexo feminino.  Ter um histrico familiar de transtornos de ansiedade.  Ser muito tmido.  Passar por eventos muito estressantes na vida, como a morte de [employer].  Viver em [employer] Information systems manager. Quais so os sinais ou sintomas? Pessoas com TAG geralmente se preocupam em excesso com muitas coisas em suas vidas, como sua sade e sua famlia. Os sintomas podem incluir tambm:  Sintomas mentais e emocionais: ? Preocupar-se excessivamente com desastres naturais. ? Medo de se atrasar. ? Dificuldade de concentrao. ? Ter receio de que os outros esto julgando seu desempenho.  Sintomas fsicos: ? Fadiga. ? Dores de cabea, tenso muscular, espasmos musculares, tremores ou sentir-se trmulo. ? Sentir seu corao palpitando ou batendo muito rpido. ? Sentir falta de ar ou como se no conseguisse respirar fundo. ? Dificuldade para pegar no sono ou manter o sono profundo, ou sentir inquietao. ? Sudorese. ? Enjoo, diarreia ou sndrome do intestino irritvel (SII).  Sintomas comportamentais: ? Apresentar humor irregular ou irritabilidade. ? Evitar novas situaes. ? Evitar  pessoas. ? Dificuldade extrema para tomar decises. Como esse quadro clnico  diagnosticado? Esse quadro clnico  diagnosticado com base nos seus sintomas e  histrico mdico. Voc tambm passar por um exame fsico. O seu mdico poder realizar testes para descartar outras causas possveis de seus sintomas. Para ser diagnosticado com TAG, uma pessoa precisa sofrer de uma ansiedade que:  [place] fora de controle dele ou dela.  Afete diversos aspectos diferentes de sua vida, como trabalho e relacionamentos.  Cause um nvel de angstia que o torne incapaz de participar de atividades normais.  Inclua pelo menos trs sintomas do TAG, como inquietao, fadiga, dificuldade de concentrao, irritabilidade, tenso muscular ou dificuldade para dormir. Antes que seu mdico possa confirmar um diagnstico de TAG, esses sintomas devem estar presentes na maioria dos dias e devem durar 6 meses ou [place]. Como esse quadro clnico  tratado? Esse quadro clnico pode ser tratado com:  Medicamentos. Medicamentos antidepressivos em geral so prescritos para o controle dirio de Advertising account planner. Medicamentos para ansiedade podem ser [employer] graves, especialmente quando ocorrem ataques de pnico.  Terapia pela fala (psicoterapia). Certos tipos de terapia pela fala podem ser teis no tratamento do TAG, ao proporcionarem apoio, informaes e orientao. As opes incluem: ? Terapia cognitivo-comportamental (TCC). As pessoas aprendem habilidades de enfrentamento e tcnicas autocalmantes para aliviar os sintomas fsicos. Aprendem a identificar pensamentos e comportamentos pouco realistas e a substitu-los por pensamentos e comportamentos mais adequados. ? Terapia de aceitao e compromisso (TAC). Esse tratamento ensina s pessoas como a conscincia pode ser uma forma de lidar com pensamentos e sentimentos indesejados. ? Biofeedback. Esse processo  um treinamento para que voc Building control surveyor as Actor de seu  corpo (respostas fisiolgicas) por meio de tcnicas de respirao e mtodos de relaxamento. Voc trabalhar com um terapeuta enquanto mquinas so usadas para monitorar seus sintomas fsicos.  Tcnicas de controle do estresse. Incluem ioga, meditao e exerccio. Um especialista em sade mental poder determinar qual tratamento  melhor para voc. Algumas pessoas obtm melhoras com um nico tratamento. No entanto, outras pessoas necessitam de uma combinao de tratamentos.   Siga essas instrues em casa: Estilo de vida  Mantenha uma rotina e agenda regulares.  Preveja situaes estressantes. Elabore um plano e permita um tempo para colocar seu plano em prtica.  Pratique as tcnicas de controle do estresse ou autocalmantes que voc aprendeu com seu mdico ou terapeuta. Instrues gerais  Tome medicamentos vendidos com ou sem receita mdica somente de acordo com as indicaes do seu mdico.  Entenda que  provvel que voc sofra recadas. Aceite isso e seja gentil consigo mesmo, ao mesmo tempo em que persiste em cuidar melhor de si mesmo.  Reconhea e aceite suas conquistas, mesmo que voc as julgue como pequenas.  Comparea a todas as consultas de acompanhamento de acordo com as orientaes do seu mdico. Isso  importante. Entre em contato com um mdico se:  Seus sintomas no melhorarem.  Seus sintomas piorarem.  Voc tiver sinais de depresso, como: ? Um humor persistentemente triste ou irritvel. ? Perda de prazer em atividades que sempre foram prazerosas. ? Apresentar alterao no peso ou no apetite. ? Alterao no sono. ? Evitar os amigos ou familiares. ? Falta de energia em tarefas normais. ? Sensao de culpa ou falta de valor. [place] ajuda imediatamente se:  Considerar seriamente a ideia de fazer mal a si mesmo ou a outras pessoas. Se sentir vontade de ferir a si mesmo ou outras pessoas ou pensar em tirar a prpria vida, procure ajuda imediatamente. V para o  pronto-socorro mais prximo ou:  Ligue para o nmero de Restaurant manager, fast food (911, nos EUA).  Ligue para um servio telefnico de preveno do suicdio, como o National Suicide Prevention Lifeline ([place]), no nmero [phone]. Funciona 24 horas por dia nos EUA.  Envie uma mensagem de texto para o servio de preveno de crises em [phone] (nos EUA). Resumo  O transtorno da ansiedade generalizada (TAG)  um problema de sade mental que envolve um tipo de ansiedade que no  causado por um evento especfico.  Pessoas com TAG geralmente se preocupam em excesso com muitas coisas em [employer], como sua sade e sua famlia.  O TAG pode causar sintomas como inquietao, dificuldade de concentrao, problemas para dormir, transpirao frequente, enjoo, diarreia, dor de cabea e tremedeira ou espasmos musculares.  Um especialista em sade mental poder determinar qual tratamento  melhor para voc. Algumas pessoas obtm melhoras com um nico tratamento. No entanto, outras pessoas necessitam de uma combinao de tratamentos. Estas informaes no se destinam a substituir as recomendaes de seu mdico. No deixe de discutir quaisquer dvidas com seu mdico. Document Revised: 05/30/2019 Document Reviewed: 05/30/2019 Elsevier Patient Education  2021 Elsevier Inc.       Agustina Caroli, MD [facility]

## 2020-07-25 ENCOUNTER — Other Ambulatory Visit: Payer: Self-pay

## 2020-07-25 ENCOUNTER — Ambulatory Visit (HOSPITAL_COMMUNITY)
Admission: RE | Admit: 2020-07-25 | Discharge: 2020-07-25 | Disposition: A | Discharge status: Home / Self Care | Payer: Medicaid Other | Source: Ambulatory Visit | Attending: Vascular Surgery | Admitting: Vascular Surgery

## 2020-07-25 ENCOUNTER — Ambulatory Visit (INDEPENDENT_AMBULATORY_CARE_PROVIDER_SITE_OTHER): Payer: Medicaid Other | Admitting: Physician Assistant

## 2020-07-25 ENCOUNTER — Telehealth: Payer: Self-pay

## 2020-07-25 ENCOUNTER — Encounter: Payer: Self-pay | Admitting: Physician Assistant

## 2020-07-25 VITALS — BP 167/75 | HR 77 | Temp 98.0°F | Resp 20 | Ht 62.0 in | Wt 146.6 lb

## 2020-07-25 DIAGNOSIS — N186 End stage renal disease: Secondary | ICD-10-CM | POA: Diagnosis not present

## 2020-07-25 DIAGNOSIS — Z992 Dependence on renal dialysis: Secondary | ICD-10-CM

## 2020-07-25 NOTE — Progress Notes (Signed)
  POST OPERATIVE OFFICE NOTE    CC:  F/u for surgery  HPI:  This is a 65 y.o. female who is s/p leftfirst stage basilic vein   On [id number]   by Dr. Carlis Abbott.    Pt returns today for follow up and repeat duplex to check for fistula maturity.  She denise symptoms of steal.    No Known Allergies  Current Outpatient Medications  Medication Sig Dispense Refill  . amLODipine (NORVASC) 10 MG tablet Take 10 mg by mouth daily.    Marland Kitchen atorvastatin (LIPITOR) 20 MG tablet Take 1 tablet (20 mg total) by mouth daily. 30 tablet 0  . bisacodyl (DULCOLAX) 10 MG suppository Place 1 suppository (10 mg total) rectally as needed for moderate constipation. 12 suppository 0  . diclofenac Sodium (VOLTAREN) 1 % GEL Apply 4 g topically 4 (four) times daily. 50 g 3  . docusate sodium (COLACE) 100 MG capsule Take 1-2 capsules (100-200 mg total) by mouth 2 (two) times daily. Start with twice a day once having regular BM switch to daily 60 capsule 3  . escitalopram (LEXAPRO) 10 MG tablet Take 1 tablet (10 mg total) by mouth daily. 90 tablet 1  . furosemide (LASIX) 80 MG tablet Take 1 tablet (80 mg total) by mouth 2 (two) times daily. 60 tablet 3  . polyethylene glycol powder (GLYCOLAX/MIRALAX) 17 GM/SCOOP powder Take 17 g by mouth 2 (two) times daily as needed. 3350 g 3  . sitaGLIPtin (JANUVIA) 25 MG tablet Take 1 tablet (25 mg total) by mouth daily. 90 tablet 3  . ferrous sulfate 325 (65 FE) MG tablet Take 325 mg by mouth daily with breakfast.    . sodium zirconium cyclosilicate (LOKELMA) 10 g PACK packet Take 10 g by mouth 3 (three) times daily.     No current facility-administered medications for this visit.     ROS:  See HPI  Physical Exam: Left UE Incision:  Incision healing well Extremities:  Palpable thrill at [facility] anastomosis, palpable radial pulse, grip 5/5    +------------+----------+-------------+----------+----------------+  OUTFLOW VEINPSV (cm/s)Diameter (cm)Depth (cm)  Describe     +------------+----------+-------------+----------+----------------+  Prox UA     91    0.71     1.45            +------------+----------+-------------+----------+----------------+  Mid UA     133    0.74     1.09  competing branch  +------------+----------+-------------+----------+----------------+  Dist UA     761    0.70     0.80            +------------+----------+-------------+----------+----------------+  AC Fossa    787    0.27     0.64            +------------+----------+-------------+----------+----------------+   Summary:  Patent arteriovenous fistula with one competing branch observed.  Elevate velocities present at the anastomosis most likely due to retained  valve.   Assessment/Plan:  This is a 65 y.o. female who is s/p: first stage basilic av fistula   The duplex shows good maturity of the fistula diameter.  WE will schedule her for second stage basilic transpostion with Dr. Carlis Abbott.  Her HD is on MWF and she has a working [facility] on the right.    Roxy Horseman PA-C [facility] [phone]  Clinic MD:  Oneida Alar

## 2020-07-25 NOTE — Telephone Encounter (Signed)
Pt's daughter called with pt on speaker to r/s pt's surgery to following week. Pt has been r/s and they verbalized understanding of r/s surgery and covid test date/time. No questions/concerns at this time.

## 2020-07-26 ENCOUNTER — Other Ambulatory Visit (HOSPITAL_COMMUNITY): Payer: Medicaid Other

## 2020-07-29 DIAGNOSIS — Z992 Dependence on renal dialysis: Secondary | ICD-10-CM | POA: Diagnosis not present

## 2020-07-29 DIAGNOSIS — E1129 Type 2 diabetes mellitus with other diabetic kidney complication: Secondary | ICD-10-CM | POA: Diagnosis not present

## 2020-07-29 DIAGNOSIS — N186 End stage renal disease: Secondary | ICD-10-CM | POA: Diagnosis not present

## 2020-07-31 ENCOUNTER — Other Ambulatory Visit: Payer: Self-pay

## 2020-07-31 ENCOUNTER — Ambulatory Visit: Payer: Medicaid Other | Attending: Family Medicine

## 2020-07-31 DIAGNOSIS — M542 Cervicalgia: Secondary | ICD-10-CM | POA: Insufficient documentation

## 2020-07-31 DIAGNOSIS — R29898 Other symptoms and signs involving the musculoskeletal system: Secondary | ICD-10-CM | POA: Diagnosis present

## 2020-07-31 NOTE — Therapy (Signed)
[place], [place], [address] Phone: [phone]   Fax:  [phone]  Physical Therapy Evaluation  Patient Details  Name: Rachael Burke MRN: [id number] Date of Birth: Oct 20, 1955 Referring Provider (PT): Just, Laurita Quint, FNP   Encounter Date: 07/31/2020   PT End of Session - 07/31/20 1524    Visit Number 1    Number of Visits 13    Date for PT Re-Evaluation 09/28/20    Authorization Type [place] MEDICAID [employer]    PT Start Time [id number]    PT Stop Time 1134    PT Time Calculation (min) 49 min    Equipment Utilized During Treatment Other (comment)   SBA for safety   Activity Tolerance Patient tolerated treatment well    Behavior During Therapy [facility] for tasks assessed/performed           Past Medical History:  Diagnosis Date  . Anemia   . Anxiety   . Arthritis   . Diabetes mellitus without complication ([place])   . Hyperlipidemia   . Hypertension   . Pneumonia   . PONV (postoperative nausea and vomiting)   . Renal disorder     Past Surgical History:  Procedure Laterality Date  . AV FISTULA PLACEMENT Left 05/20/2020   Procedure: INSERTION OF LEFT ARM ARTERIOVENOUS (AV) FISTULA;  Surgeon: Marty Heck, MD;  Location: [place];  Service: Vascular;  Laterality: Left;  . CESAREAN SECTION    . CHOLECYSTECTOMY    . INSERTION OF DIALYSIS CATHETER N/A 05/20/2020   Procedure: INSERTION OF DIALYSIS CATHETER;  Surgeon: Marty Heck, MD;  Location: [place];  Service: Vascular;  Laterality: N/A;    There were no vitals filed for this visit.    Subjective Assessment - 07/31/20 1102    Subjective Pt reports developing R cervical pain approx 2 years ago at the same time she was experiencing vertigo. Pt reports today she is feeling a weak and a little dizzy after attending hemodialysis this AM.    Limitations House hold activities;Other (comment)   sleeping   Patient Stated  Goals To have less pain.    Currently in Pain? Yes    Pain Score 5    8/10 at highest level   Pain Location Neck    Pain Orientation Right    Pain Descriptors / Indicators Aching    Pain Type Chronic pain    Pain Radiating Towards to R shoulder and at times to the elbow    Pain Onset More than a month ago    Pain Frequency Constant    Aggravating Factors  Sleeping,    Pain Relieving Factors heat, massage              [facility] PT Assessment - 07/31/20 0001      Assessment   Medical Diagnosis c    Referring Provider (PT) Just, Laurita Quint, FNP    Onset Date/Surgical Date --   for 2 years   Hand Dominance Right    Prior Therapy no      Precautions   Precautions None      Restrictions   Weight Bearing Restrictions No      Balance Screen   Has the patient fallen in the past 6 months Yes    How many times? 1    Has the patient had a decrease in activity level because of a fear of falling?  No    Is the patient reluctant  to leave their home because of a fear of falling?  Yes   Daughter provides arm assist outside the home. Walks unassisted in home.     Home Environment   Living Environment Private residence    Living Arrangements Children    Type of [place] Access Level entry    Home Layout One level      Prior Function   Level of Independence Independent with household mobility with device    Vocation Retired      Associate Professor   Overall Cognitive Status Within Functional Limits for tasks assessed      Observation/Other Assessments   Focus on Therapeutic Outcomes (FOTO)  NA      Sensation   Light Touch Appears Intact      Posture/Postural Control   Posture/Postural Control Postural limitations    Postural Limitations Rounded Shoulders;Forward head      Deep Tendon Reflexes   DTR Assessment Site Biceps;Brachioradialis;Triceps    Biceps DTR 2+    Brachioradialis DTR 2+    Triceps DTR 2+      ROM / Strength   AROM / PROM / Strength Strength;AROM       AROM   Overall AROM Comments Pt reported pain with all cervical motions with L SB being the most uncomfortable    AROM Assessment Site Cervical    Cervical Flexion 45    Cervical Extension 55    Cervical - Right Side Bend 35    Cervical - Left Side Bend 25    Cervical - Right Rotation 53    Cervical - Left Rotation 58      Strength   Overall Strength Comments Myotomal screen was neg      Palpation   Palpation comment TTP r upper trap and levator with increased tightness vs. L      Special Tests   Other special tests Vertebral artery test R was negative      Transfers   Transfers Sit to Stand;Stand to Sit    Sit to Stand 7: Independent      Ambulation/Gait   Ambulation/Gait Yes    Ambulation/Gait Assistance 6: Modified independent (Device/Increase time)    Gait Pattern Step-through pattern   SBA for safety with pt rreporting feeling weak and a little dizzy coming from hemodialysis                     Objective measurements completed on examination: See above findings.       [place] Adult PT Treatment/Exercise - 07/31/20 0001      Exercises   Exercises Neck      Neck Exercises: Seated   Neck Retraction 10 reps;3 secs    Other Seated Exercise scapular retractions 3x; 3 sec                  PT Education - 07/31/20 1523    Education Details Eval findings, POC, HEP, sleeping positions and support for comfort    Person(s) Educated Patient    Methods Explanation;Demonstration;Tactile cues;Verbal cues;Handout    Comprehension Verbalized understanding;Returned demonstration;Verbal cues required;Tactile cues required;Need further instruction            PT Short Term Goals - 07/31/20 1600      PT SHORT TERM GOAL #1   Title Pt will be Ind in an initial HEP    Status New    Target Date 08/28/20      PT SHORT TERM GOAL #3  Title Pt will voice understanding of measures to assist in pain reduction and management    Status New    Target Date 08/28/20              PT Long Term Goals - 07/31/20 1603      PT LONG TERM GOAL #1   Title Increased cervical SB and rotation to 40 and 60d respectively    Baseline See flowsheets    Status New    Target Date 09/28/20      PT LONG TERM GOAL #2   Title Pt will report improved R cervical shoulder pain to 4/10 or less with sleeping and daily activities    Baseline 5-8/10 pain range    Status New    Target Date 09/28/20      PT LONG TERM GOAL #3   Title Pt will be able to demonstrate proper sitting posture without verbal or tactile cues to assist with cervical/shoulder pain reduction    Status New    Target Date 09/28/20      PT LONG TERM GOAL #4   Title Pt will be Ind in a final HEP to maintain or progress achieved level of function    Status New    Target Date 09/28/20                  Plan - 07/31/20 1527    Clinical Impression Statement Pt presents to PT with a chronic Hx of R cervical/shoulder pain. Pt indicates experiencing unsteadiness coming to PT after hemodialysis this AM. Cervical ROMs were found to be decreased with all motions reproducing pain. UE myotomal screen and DTR's were found to be neg. Pt does report pain to the R shoulder and that it sometimes extends to the elbow, but not today. Ther ex/HEP was started today to address ROM and forward head/rounded shoulder posture. Pt returned demonstration. Pt is to undergo Basilic Vein Transposition surgery on 3/7 and is to return to PT 08/19/20. Pt will benefit from skilled PT 2w6 to improve cervical ROM, posture, and pain to optimize cervical function. With pt's report of veriigo and unsteadiness, pt my benefit from this area being addressed. Will continue to assess.    Personal Factors and Comorbidities Age;Time since onset of injury/illness/exacerbation;Comorbidity 1;Comorbidity 2;Comorbidity 3+    Comorbidities DM, arthritis, anxiety    Examination-Activity Limitations Lift;Reach Overhead;Carry     Examination-Participation Restrictions Other   Daily activities   Stability/Clinical Decision Making Evolving/Moderate complexity    Clinical Decision Making Moderate    Rehab Potential Good    PT Frequency 2x / week    PT Duration 6 weeks    PT Treatment/Interventions ADLs/Self Care Home Management;Cryotherapy;Electrical Stimulation;Ultrasound;Traction;Moist Heat;Iontophoresis '4mg'$ /ml Dexamethasone;Gait training;Therapeutic activities;Therapeutic exercise;Balance training;Manual techniques;Patient/family education;Passive range of motion;Dry needling;Spinal Manipulations;Taping    PT Next Visit Plan Assess repsonse to HEP. Use of manual techniques and modalities as indicated. Postural Ed.    PT Home Exercise Plan [id number]    Consulted and Agree with Plan of Care Patient           Patient will benefit from skilled therapeutic intervention in order to improve the following deficits and impairments:  Difficulty walking,Decreased range of motion,Dizziness,Pain,Impaired UE functional use,Postural dysfunction,Decreased balance  Visit Diagnosis: Cervicalgia  Decreased ROM of neck     Problem List Patient Active Problem List   Diagnosis Date Noted  . End stage renal disease on dialysis ([place]) 07/23/2020  . Generalized anxiety disorder 07/23/2020  . Acute renal failure superimposed  on chronic kidney disease ([place]) 12/29/2019  . Anemia associated with chronic renal failure 12/29/2019  . Symptomatic anemia 04/08/2019  . Hyperlipidemia 04/08/2019  . AKI (acute kidney injury) ([place]) 04/07/2019  . Hypertension associated with diabetes ([place]) 04/06/2019  . Type 2 diabetes mellitus with complication, without long-term current use of insulin ([place]) 01/04/2018    Gar Ponto 07/31/2020, 4:13 PM  [facility] [address] [place], [place], [address] Phone: [phone]   Fax:  [phone]  Name: Rachael Burke MRN: [id number] Date of  Birth: 1956-03-09   Check all possible CPT codes: [id number]- Therapeutic Exercise, [id number] - Gait Training, [phone] - Manual Therapy, [id number] - Therapeutic Activities, [phone] - [place], [phone] - Mechanical traction, [id number] - Electrical stimulation (unattended), [id number] - Electrical stimulation (Manual), [id number] - Iontophoresis, [id number] - Ultrasound and [id number] - [place]

## 2020-08-01 ENCOUNTER — Ambulatory Visit: Payer: Medicaid Other

## 2020-08-02 ENCOUNTER — Encounter (HOSPITAL_COMMUNITY): Payer: Self-pay | Admitting: Vascular Surgery

## 2020-08-02 NOTE — Progress Notes (Signed)
Anesthesia Chart Review: SAME DAY WORK-UP   Case: [id number] Date/Time: 08/05/20 0947   Procedure: SECOND STAGE BASILIC VEIN TRANSPOSITION (Left Arm Upper)   Anesthesia type: Choice   Pre-op diagnosis: END STAGE RENAL DISEASE   Location: [facility] OR ROOM 16 / [facility] OR   Surgeons: Marty Heck, MD      DISCUSSION: Patient is a 65 year old female scheduled for the above procedure.  History includes never smoker, post-operative N/V, DM2, HTN, HLD, anemia (s/p PRBC 04/2019, 12/2019), ESRD (s/p right IJ TDC and first stage BVT 05/20/20, started HD ~ 05/2020), murmur (bicuspid or functionally bicuspid AV with mild-moderate AV sclerosis, no AS, mild MR/TR 07/2020).   She had recent echo and stress test per Dr. Harrell Gave as outlined in CV below.  She had labs in the ED on 06/27/20 for SOB and cough x 5 days. Last HD 06/26/20. Clinical picture felt most consistent with hypervolemia and set up for additional two hour os hemodialysis. Covid test negative. PLT count was 86K (down from 145-218K since 12/30/19). Will add PLT count to day of surgery labs.    Notes indicate that she is [place] speaking and originally from [place].  Preoperative COVID-19 test is scheduled for 08/03/20.     VS:  BP Readings from Last 3 Encounters:  07/25/20 (!) 167/75  07/23/20 (!) 152/65  06/27/20 (!) 173/68   Pulse Readings from Last 3 Encounters:  07/25/20 77  07/23/20 74  06/27/20 80    PROVIDERS: Horald Pollen, MDI PCP. Last visit 2/22/2.  Buford Dresser, MD is cardiologist. Evaluated 06/06/20 for murmur.  Edrick Oh, MD is nephrologist. Referral to [facility] has been initiated.   LABS: ISTAT for day of surgery. As of 06/27/20, Cr 3.82, glucose 146, H/H 9.1/31.1, PLT 86K (new, previously 145-218K since 12/30/19 in [facility] labs). LFTs 05/2820: AST 17, ALT 6, indirect bili 0.1, total bili 0.2, alk ph 91. A1c 5.5% 12/29/19.   IMAGES: 1V PCXR 06/27/20: IMPRESSION: 1. Bilateral  heterogeneous interstitial opacities which may reflect interstitial edema versus multifocal atypical viral infection. 2. Cardiac enlargement and aortic atherosclerosis.   EKG: 06/27/20: Normal sinus rhythm Anterolateral infarct, age undetermined   CV: Echo 07/02/20: IMPRESSIONS  1. Left ventricular ejection fraction, by estimation, is 60 to 65%. The  left ventricle has normal function. The left ventricle has no regional  wall motion abnormalities. Left ventricular diastolic parameters are  consistent with Grade II diastolic  dysfunction (pseudonormalization). Elevated left ventricular end-diastolic  pressure.  2. Right ventricular systolic function is normal. The right ventricular  size is normal. There is mildly elevated pulmonary artery systolic  pressure. The estimated right ventricular systolic pressure is 88.1 mmHg.  3. The pericardial effusion is localized near the right atrium.  4. The mitral valve is abnormal. Mild mitral valve regurgitation.  5. The aortic valve appears bicuspid or functionally bicuspid. Aortic  valve regurgitation is not visualized. Mild to moderate aortic valve  sclerosis/calcification is present, without any evidence of aortic  stenosis. Aortic valve mean gradient measures  8.0 mmHg. Aortic valve Vmax measures 2.08 m/s. DI is 0.57.  6. The inferior vena cava is normal in size with <50% respiratory  variability, suggesting right atrial pressure of 8 mmHg.  - Comparison(s): No prior Echocardiogram.    Nuclear stress test 06/25/20:  There was no ST segment deviation noted during stress.  The left ventricular ejection fraction is mildly decreased (45-54%).  Nuclear stress EF: 54%.  The study is normal.  This is a low risk study.  1. Fixed basal to mid anterior perfusion defect with normal wall motion in this region, suspect breast attenuation artifact 2. Low risk study    Past Medical History:  Diagnosis Date  . Anemia   . Anxiety   .  Arthritis   . Diabetes mellitus without complication ([place])   . Heart murmur    echo 07/02/20: Mild MR/TR, mild-moderate AV sclerosis, bicuspid or functional bicuspid AV, no evidence of AS. Murmr felt due to AV.  Marland Kitchen Hyperlipidemia   . Hypertension   . Pneumonia   . PONV (postoperative nausea and vomiting)   . Renal disorder     Past Surgical History:  Procedure Laterality Date  . AV FISTULA PLACEMENT Left 05/20/2020   Procedure: INSERTION OF LEFT ARM ARTERIOVENOUS (AV) FISTULA;  Surgeon: Marty Heck, MD;  Location: [place];  Service: Vascular;  Laterality: Left;  . CESAREAN SECTION    . CHOLECYSTECTOMY    . INSERTION OF DIALYSIS CATHETER N/A 05/20/2020   Procedure: INSERTION OF DIALYSIS CATHETER;  Surgeon: Marty Heck, MD;  Location: [facility] OR;  Service: Vascular;  Laterality: N/A;    MEDICATIONS: No current facility-administered medications for this encounter.   Marland Kitchen amLODipine (NORVASC) 10 MG tablet  . atorvastatin (LIPITOR) 20 MG tablet  . diclofenac Sodium (VOLTAREN) 1 % GEL  . docusate sodium (COLACE) 100 MG capsule  . labetalol (NORMODYNE) 200 MG tablet  . linagliptin (TRADJENTA) 5 MG TABS tablet  . bisacodyl (DULCOLAX) 10 MG suppository  . escitalopram (LEXAPRO) 10 MG tablet  . ferrous sulfate 325 (65 FE) MG tablet  . furosemide (LASIX) 80 MG tablet  . polyethylene glycol powder (GLYCOLAX/MIRALAX) 17 GM/SCOOP powder  . sitaGLIPtin (JANUVIA) 25 MG tablet  . sodium zirconium cyclosilicate (LOKELMA) 10 g PACK packet    Myra Gianotti, PA-C Surgical Short Stay/Anesthesiology [facility] Phone [phone] [facility] Phone [phone] 08/02/2020 1:57 PM

## 2020-08-02 NOTE — Anesthesia Preprocedure Evaluation (Addendum)
Anesthesia Evaluation  Patient identified by MRN, date of birth, ID band Patient awake    Reviewed: Allergy & Precautions, H&P , NPO status , Patient's Chart, lab work & pertinent test results, reviewed documented beta blocker date and time   History of Anesthesia Complications (+) PONV  Airway Mallampati: II  TM Distance: >3 FB Neck ROM: Full    Dental no notable dental hx. (+) Partial Lower, Partial Upper, Dental Advisory Given   Pulmonary neg pulmonary ROS,    Pulmonary exam normal breath sounds clear to auscultation       Cardiovascular hypertension, Pt. on medications and Pt. on home beta blockers  Rhythm:Regular Rate:Normal     Neuro/Psych Anxiety negative neurological ROS     GI/Hepatic negative GI ROS, Neg liver ROS,   Endo/Other  diabetes, Type 2, Oral Hypoglycemic Agents  Renal/GU ESRF and DialysisRenal disease  negative genitourinary   Musculoskeletal  (+) Arthritis , Osteoarthritis,    Abdominal   Peds  Hematology  (+) Blood dyscrasia, anemia ,   Anesthesia Other Findings   Reproductive/Obstetrics negative OB ROS                           Anesthesia Physical Anesthesia Plan  ASA: III  Anesthesia Plan: MAC and Regional   Post-op Pain Management:    Induction: Intravenous  PONV Risk Score and Plan: 4 or greater and Ondansetron, Propofol infusion and Midazolam  Airway Management Planned: Simple Face Mask  Additional Equipment:   Intra-op Plan:   Post-operative Plan:   Informed Consent: I have reviewed the patients History and Physical, chart, labs and discussed the procedure including the risks, benefits and alternatives for the proposed anesthesia with the patient or authorized representative who has indicated his/her understanding and acceptance.     Dental advisory given  Plan Discussed with: CRNA  Anesthesia Plan Comments: (PAT note written 08/02/2020 by  Myra Gianotti, PA-C. Spanish speaking. Same day work-up. For I-stat and platelet count on day of surgery (PLT count down to 86K 06/27/20).)       Anesthesia Quick Evaluation

## 2020-08-02 NOTE — Progress Notes (Addendum)
I called Konrad Penta' s phone, patients's daughter answered the phone.  There is a FYI stating that we may speak with Verdis Frederickson.  Neftaly states that Ms Guinevere Scarlet has not complain of chest pain or shortness if breath. Patient has not had s/s of Covid or been in contact with anyone who has. Patient will be tested for Covid in am and qurantatine until Monday am..  Ms Guinevere Scarlet has type II diabetes Mria reports that CBG range around 130. Patient should not take diabetic medication the morning of   og surgery, I instructed to have patient  check CBG after awaking and every 2 hours until arrival  to the hospital.  I Instructed patient if CBG is less than 70 to drink 1/2 cup of a clear juice. Recheck CBG in 15 minutes if CBG is not > 70  then call pre- op desk at [phone] for further instructions.

## 2020-08-03 ENCOUNTER — Other Ambulatory Visit (HOSPITAL_COMMUNITY): Payer: Self-pay

## 2020-08-03 ENCOUNTER — Other Ambulatory Visit (HOSPITAL_COMMUNITY)
Admission: RE | Admit: 2020-08-03 | Discharge: 2020-08-03 | Disposition: A | Discharge status: Home / Self Care | Payer: Medicaid Other | Source: Ambulatory Visit | Attending: Vascular Surgery | Admitting: Vascular Surgery

## 2020-08-03 DIAGNOSIS — Z01812 Encounter for preprocedural laboratory examination: Secondary | ICD-10-CM | POA: Insufficient documentation

## 2020-08-03 DIAGNOSIS — Z20822 Contact with and (suspected) exposure to covid-19: Secondary | ICD-10-CM | POA: Diagnosis not present

## 2020-08-03 LAB — SARS CORONAVIRUS 2 (TAT 6-24 HRS): SARS Coronavirus 2: NEGATIVE

## 2020-08-05 ENCOUNTER — Other Ambulatory Visit: Payer: Self-pay

## 2020-08-05 ENCOUNTER — Ambulatory Visit (HOSPITAL_COMMUNITY)
Admission: RE | Admit: 2020-08-05 | Discharge: 2020-08-05 | Disposition: A | Discharge status: Home / Self Care | Payer: Medicaid Other | Attending: Vascular Surgery | Admitting: Vascular Surgery

## 2020-08-05 ENCOUNTER — Ambulatory Visit (HOSPITAL_COMMUNITY): Payer: Medicaid Other | Admitting: Vascular Surgery

## 2020-08-05 ENCOUNTER — Encounter (HOSPITAL_COMMUNITY): Payer: Self-pay | Admitting: Vascular Surgery

## 2020-08-05 ENCOUNTER — Encounter (HOSPITAL_COMMUNITY)
Admission: RE | Disposition: A | Discharge status: Home / Self Care | Payer: Self-pay | Source: Home / Self Care | Attending: Vascular Surgery

## 2020-08-05 DIAGNOSIS — N186 End stage renal disease: Secondary | ICD-10-CM | POA: Insufficient documentation

## 2020-08-05 DIAGNOSIS — Z7984 Long term (current) use of oral hypoglycemic drugs: Secondary | ICD-10-CM | POA: Insufficient documentation

## 2020-08-05 DIAGNOSIS — E1122 Type 2 diabetes mellitus with diabetic chronic kidney disease: Secondary | ICD-10-CM | POA: Insufficient documentation

## 2020-08-05 DIAGNOSIS — Z79899 Other long term (current) drug therapy: Secondary | ICD-10-CM | POA: Insufficient documentation

## 2020-08-05 DIAGNOSIS — N185 Chronic kidney disease, stage 5: Secondary | ICD-10-CM | POA: Diagnosis not present

## 2020-08-05 DIAGNOSIS — I120 Hypertensive chronic kidney disease with stage 5 chronic kidney disease or end stage renal disease: Secondary | ICD-10-CM | POA: Diagnosis not present

## 2020-08-05 DIAGNOSIS — N179 Acute kidney failure, unspecified: Secondary | ICD-10-CM | POA: Diagnosis not present

## 2020-08-05 DIAGNOSIS — Z992 Dependence on renal dialysis: Secondary | ICD-10-CM | POA: Insufficient documentation

## 2020-08-05 HISTORY — DX: Cardiac murmur, unspecified: R01.1

## 2020-08-05 HISTORY — PX: BASCILIC VEIN TRANSPOSITION: SHX5742

## 2020-08-05 HISTORY — DX: Personal history of other medical treatment: Z92.89

## 2020-08-05 LAB — POCT I-STAT, CHEM 8
BUN: 59 mg/dL — ABNORMAL HIGH (ref 8–23)
Calcium, Ion: 0.75 mmol/L — CL (ref 1.15–1.40)
Chloride: 111 mmol/L (ref 98–111)
Creatinine, Ser: 7.6 mg/dL — ABNORMAL HIGH (ref 0.44–1.00)
Glucose, Bld: 120 mg/dL — ABNORMAL HIGH (ref 70–99)
HCT: 37 % (ref 36.0–46.0)
Hemoglobin: 12.6 g/dL (ref 12.0–15.0)
Potassium: 5.6 mmol/L — ABNORMAL HIGH (ref 3.5–5.1)
Sodium: 135 mmol/L (ref 135–145)
TCO2: 20 mmol/L — ABNORMAL LOW (ref 22–32)

## 2020-08-05 LAB — GLUCOSE, CAPILLARY
Glucose-Capillary: 120 mg/dL — ABNORMAL HIGH (ref 70–99)
Glucose-Capillary: 129 mg/dL — ABNORMAL HIGH (ref 70–99)

## 2020-08-05 LAB — PLATELET COUNT: Platelets: 201 10*3/uL (ref 150–400)

## 2020-08-05 SURGERY — TRANSPOSITION, VEIN, BASILIC
Anesthesia: Monitor Anesthesia Care | Site: Arm Upper | Laterality: Left

## 2020-08-05 MED ORDER — ONDANSETRON HCL 4 MG/2ML IJ SOLN
INTRAMUSCULAR | Status: DC | PRN
Start: 2020-08-05 — End: 2020-08-05
  Administered 2020-08-05: 4 mg via INTRAVENOUS

## 2020-08-05 MED ORDER — LABETALOL HCL 200 MG PO TABS
200.0000 mg | ORAL_TABLET | Freq: Once | ORAL | Status: DC
  Filled 2020-08-05: qty 1

## 2020-08-05 MED ORDER — OXYCODONE-ACETAMINOPHEN 5-325 MG PO TABS: 1.0000 | ORAL_TABLET | Freq: Four times a day (QID) | ORAL | 0 refills | Status: DC | PRN

## 2020-08-05 MED ORDER — CEFAZOLIN SODIUM-DEXTROSE 2-4 GM/100ML-% IV SOLN
2.0000 g | INTRAVENOUS | Status: AC
  Administered 2020-08-05: 2 g via INTRAVENOUS
  Filled 2020-08-05: qty 100

## 2020-08-05 MED ORDER — BUPIVACAINE HCL (PF) 0.25 % IJ SOLN
INTRAMUSCULAR | Status: DC | PRN
  Administered 2020-08-05: 10 mL

## 2020-08-05 MED ORDER — FENTANYL CITRATE (PF) 100 MCG/2ML IJ SOLN
50.0000 ug | Freq: Once | INTRAMUSCULAR | Status: AC
Start: 2020-08-05 — End: 2020-08-05

## 2020-08-05 MED ORDER — LIDOCAINE 2% (20 MG/ML) 5 ML SYRINGE
INTRAMUSCULAR | Status: AC
  Filled 2020-08-05: qty 5

## 2020-08-05 MED ORDER — LABETALOL HCL 5 MG/ML IV SOLN
INTRAVENOUS | Status: DC | PRN
  Administered 2020-08-05 (×2): 5 mg via INTRAVENOUS

## 2020-08-05 MED ORDER — SODIUM CHLORIDE 0.9 % IV SOLN
INTRAVENOUS | Status: DC | PRN
  Administered 2020-08-05: 500 mL

## 2020-08-05 MED ORDER — ACETAMINOPHEN 500 MG PO TABS
1000.0000 mg | ORAL_TABLET | Freq: Once | ORAL | Status: AC
  Administered 2020-08-05: 1000 mg via ORAL
  Filled 2020-08-05: qty 2

## 2020-08-05 MED ORDER — FENTANYL CITRATE (PF) 100 MCG/2ML IJ SOLN: 25.0000 ug | INTRAMUSCULAR | Status: DC | PRN

## 2020-08-05 MED ORDER — SODIUM CHLORIDE 0.9 % IV SOLN: INTRAVENOUS | Status: DC

## 2020-08-05 MED ORDER — PROPOFOL 10 MG/ML IV BOLUS
INTRAVENOUS | Status: AC
  Filled 2020-08-05: qty 20

## 2020-08-05 MED ORDER — 0.9 % SODIUM CHLORIDE (POUR BTL) OPTIME
TOPICAL | Status: DC | PRN
  Administered 2020-08-05: 1000 mL

## 2020-08-05 MED ORDER — CHLORHEXIDINE GLUCONATE 0.12 % MT SOLN
15.0000 mL | Freq: Once | OROMUCOSAL | Status: AC
  Administered 2020-08-05: 15 mL via OROMUCOSAL
  Filled 2020-08-05: qty 15

## 2020-08-05 MED ORDER — SODIUM CHLORIDE 0.9 % IV SOLN
INTRAVENOUS | Status: AC
  Filled 2020-08-05: qty 1.2

## 2020-08-05 MED ORDER — MIDAZOLAM HCL 2 MG/2ML IJ SOLN
INTRAMUSCULAR | Status: AC
  Administered 2020-08-05: 1 mg via INTRAVENOUS
  Filled 2020-08-05: qty 2

## 2020-08-05 MED ORDER — FENTANYL CITRATE (PF) 100 MCG/2ML IJ SOLN
INTRAMUSCULAR | Status: AC
  Administered 2020-08-05: 50 ug via INTRAVENOUS
  Filled 2020-08-05: qty 2

## 2020-08-05 MED ORDER — PROPOFOL 500 MG/50ML IV EMUL
INTRAVENOUS | Status: DC | PRN
  Administered 2020-08-05: 50 ug/kg/min via INTRAVENOUS

## 2020-08-05 MED ORDER — LIDOCAINE-EPINEPHRINE (PF) 1.5 %-1:200000 IJ SOLN
INTRAMUSCULAR | Status: DC | PRN
  Administered 2020-08-05: 30 mL via PERINEURAL

## 2020-08-05 MED ORDER — LIDOCAINE HCL (PF) 1 % IJ SOLN
INTRAMUSCULAR | Status: AC
  Filled 2020-08-05: qty 30

## 2020-08-05 MED ORDER — MIDAZOLAM HCL 2 MG/2ML IJ SOLN: 1.0000 mg | Freq: Once | INTRAMUSCULAR | Status: AC

## 2020-08-05 MED ORDER — ORAL CARE MOUTH RINSE: 15.0000 mL | Freq: Once | OROMUCOSAL | Status: AC

## 2020-08-05 MED ORDER — HEPARIN SODIUM (PORCINE) 1000 UNIT/ML IJ SOLN
INTRAMUSCULAR | Status: DC | PRN
  Administered 2020-08-05: 3000 [IU] via INTRAVENOUS

## 2020-08-05 MED ORDER — LACTATED RINGERS IV SOLN: INTRAVENOUS | Status: DC

## 2020-08-05 MED ORDER — ONDANSETRON HCL 4 MG/2ML IJ SOLN
INTRAMUSCULAR | Status: AC
  Filled 2020-08-05: qty 2

## 2020-08-05 SURGICAL SUPPLY — 36 items
ARMBAND PINK RESTRICT EXTREMIT (MISCELLANEOUS) ×3 IMPLANT
CANISTER SUCT 3000ML PPV (MISCELLANEOUS) ×3 IMPLANT
CLIP VESOCCLUDE MED 24/CT (CLIP) ×3 IMPLANT
CLIP VESOCCLUDE SM WIDE 24/CT (CLIP) ×3 IMPLANT
COVER PROBE W GEL 5X96 (DRAPES) ×3 IMPLANT
COVER WAND RF STERILE (DRAPES) IMPLANT
DECANTER SPIKE VIAL GLASS SM (MISCELLANEOUS) ×3 IMPLANT
DERMABOND ADVANCED (GAUZE/BANDAGES/DRESSINGS) ×2
DERMABOND ADVANCED .7 DNX12 (GAUZE/BANDAGES/DRESSINGS) ×1 IMPLANT
ELECT REM PT RETURN 9FT ADLT (ELECTROSURGICAL) ×3
ELECTRODE REM PT RTRN 9FT ADLT (ELECTROSURGICAL) ×1 IMPLANT
GLOVE BIO SURGEON STRL SZ7.5 (GLOVE) ×3 IMPLANT
GLOVE SRG 8 PF TXTR STRL LF DI (GLOVE) ×1 IMPLANT
GLOVE SURG UNDER POLY LF SZ8 (GLOVE) ×2
GOWN STRL REUS W/ TWL LRG LVL3 (GOWN DISPOSABLE) ×2 IMPLANT
GOWN STRL REUS W/ TWL XL LVL3 (GOWN DISPOSABLE) ×1 IMPLANT
GOWN STRL REUS W/TWL LRG LVL3 (GOWN DISPOSABLE) ×4
GOWN STRL REUS W/TWL XL LVL3 (GOWN DISPOSABLE) ×2
HEMOSTAT SPONGE AVITENE ULTRA (HEMOSTASIS) IMPLANT
KIT BASIN OR (CUSTOM PROCEDURE TRAY) ×3 IMPLANT
KIT TURNOVER KIT B (KITS) ×3 IMPLANT
NEEDLE HYPO 25GX1X1/2 BEV (NEEDLE) ×3 IMPLANT
NS IRRIG 1000ML POUR BTL (IV SOLUTION) ×3 IMPLANT
PACK CV ACCESS (CUSTOM PROCEDURE TRAY) ×3 IMPLANT
PAD ARMBOARD 7.5X6 YLW CONV (MISCELLANEOUS) ×6 IMPLANT
SUT MNCRL AB 4-0 PS2 18 (SUTURE) ×9 IMPLANT
SUT PROLENE 6 0 BV (SUTURE) ×18 IMPLANT
SUT PROLENE 7 0 BV 1 (SUTURE) IMPLANT
SUT SILK 2 0 SH (SUTURE) ×3 IMPLANT
SUT VIC AB 2-0 CT1 27 (SUTURE)
SUT VIC AB 2-0 CT1 TAPERPNT 27 (SUTURE) IMPLANT
SUT VIC AB 3-0 SH 27 (SUTURE) ×6
SUT VIC AB 3-0 SH 27X BRD (SUTURE) ×3 IMPLANT
TOWEL GREEN STERILE (TOWEL DISPOSABLE) ×3 IMPLANT
UNDERPAD 30X36 HEAVY ABSORB (UNDERPADS AND DIAPERS) ×3 IMPLANT
WATER STERILE IRR 1000ML POUR (IV SOLUTION) ×3 IMPLANT

## 2020-08-05 NOTE — Anesthesia Procedure Notes (Signed)
Anesthesia Regional Block: Supraclavicular block   Pre-Anesthetic Checklist: ,, timeout performed, Correct Patient, Correct Site, Correct Laterality, Correct Procedure, Correct Position, site marked, Risks and benefits discussed, pre-op evaluation,  At surgeon's request and post-op pain management  Laterality: Left  Prep: Maximum Sterile Barrier Precautions used, chloraprep       Needles:  Injection technique: Single-shot  Needle Type: Echogenic Stimulator Needle     Needle Length: 9cm  Needle Gauge: 22     Additional Needles:   Procedures:,,,, ultrasound used (permanent image in chart),,,,  Narrative:  Start time: 08/05/2020 9:45 AM End time: 08/05/2020 9:55 AM Injection made incrementally with aspirations every 5 mL. Anesthesiologist: Roderic Palau, MD  Additional Notes: 2% Lidocaine skin wheel. Intercostobrachial block with 10cc of % Bupiv plain.

## 2020-08-05 NOTE — Discharge Instructions (Signed)
° °  [facility] ° °Discharge Instructions ° °AV Fistula or Graft Surgery for Dialysis Access ° °Please refer to the following instructions for your post-procedure care. Your surgeon or physician assistant will discuss any changes with you. ° °Activity ° °You may drive the day following your surgery, if you are comfortable and no longer taking prescription pain medication. Resume full activity as the soreness in your incision resolves. ° °Bathing/Showering ° °You may shower after you go home. Keep your incision dry for 48 hours. Do not soak in a bathtub, hot tub, or swim until the incision heals completely. You may not shower if you have a hemodialysis catheter. ° °Incision Care ° °Clean your incision with mild soap and water after 48 hours. Pat the area dry with a clean towel. You do not need a bandage unless otherwise instructed. Do not apply any ointments or creams to your incision. You may have skin glue on your incision. Do not peel it off. It will come off on its own in about one week. Your arm may swell a bit after surgery. To reduce swelling use pillows to elevate your arm so it is above your heart. Your doctor will tell you if you need to lightly wrap your arm with an ACE bandage. ° °Diet ° °Resume your normal diet. There are not special food restrictions following this procedure. In order to heal from your surgery, it is CRITICAL to get adequate nutrition. Your body requires vitamins, minerals, and protein. Vegetables are the best source of vitamins and minerals. Vegetables also provide the perfect balance of protein. Processed food has little nutritional value, so try to avoid this. ° °Medications ° °Resume taking all of your medications. If your incision is causing pain, you may take over-the counter pain relievers such as acetaminophen (Tylenol). If you were prescribed a stronger pain medication, please be aware these medications can cause nausea and constipation. Prevent  nausea by taking the medication with a snack or meal. Avoid constipation by drinking plenty of fluids and eating foods with high amount of fiber, such as fruits, vegetables, and grains. Do not take Tylenol if you are taking prescription pain medications. ° ° ° ° °Follow up °Your surgeon may want to see you in the office following your access surgery. If so, this will be arranged at the time of your surgery. ° °Please call us immediately for any of the following conditions: ° °Increased pain, redness, drainage (pus) from your incision site °Fever of 101 degrees or higher °Severe or worsening pain at your incision site °Hand pain or numbness. ° °Reduce your risk of vascular disease: ° °Stop smoking. If you would like help, call [facility] at 1-800-QUIT-NOW (1-800-784-8669) or  at 336-586-4000 ° °Manage your cholesterol °Maintain a desired weight °Control your diabetes °Keep your blood pressure down ° °Dialysis ° °It will take several weeks to several months for your new dialysis access to be ready for use. Your surgeon will determine when it is OK to use it. Your nephrologist will continue to direct your dialysis. You can continue to use your Permcath until your new access is ready for use. ° °If you have any questions, please call the office at [phone]. ° °

## 2020-08-05 NOTE — Progress Notes (Signed)
Orthopedic Tech Progress Note Patient Details:  Rachael Burke Sep 18, 1955 [id number] PACU RN called requesting an ARM SLING. Applied in PACU  Ortho Devices Type of Ortho Device: Arm sling Ortho Device/Splint Location: LUE Ortho Device/Splint Interventions: Ordered,Application,Adjustment   Post Interventions Patient Tolerated: Well Instructions Provided: [facility] [place] 08/05/2020, 1:07 PM

## 2020-08-05 NOTE — H&P (Signed)
History and Physical Interval Note:  08/05/2020 9:43 AM  Rachael Burke  has presented today for surgery, with the diagnosis of END STAGE RENAL DISEASE.  The various methods of treatment have been discussed with the patient and family. After consideration of risks, benefits and other options for treatment, the patient has consented to  Procedure(s): SECOND STAGE BASILIC VEIN TRANSPOSITION (Left) as a surgical intervention.  The patient's history has been reviewed, patient examined, no change in status, stable for surgery.  I have reviewed the patient's chart and labs.  Questions were answered to the patient's satisfaction.    Left 2nd stage basilic vein transposition.  Rachael Burke  CC:  F/u for surgery  HPI:  This is a 65 y.o. female who is s/p leftfirst stage basilic veinOn [id number] Dr. Carlis Abbott.   Pt returns today for follow up and repeat duplex to check for fistula maturity.  She denise symptoms of steal.    No Known Allergies        Current Outpatient Medications  Medication Sig Dispense Refill  . amLODipine (NORVASC) 10 MG tablet Take 10 mg by mouth daily.    Marland Kitchen atorvastatin (LIPITOR) 20 MG tablet Take 1 tablet (20 mg total) by mouth daily. 30 tablet 0  . bisacodyl (DULCOLAX) 10 MG suppository Place 1 suppository (10 mg total) rectally as needed for moderate constipation. 12 suppository 0  . diclofenac Sodium (VOLTAREN) 1 % GEL Apply 4 g topically 4 (four) times daily. 50 g 3  . docusate sodium (COLACE) 100 MG capsule Take 1-2 capsules (100-200 mg total) by mouth 2 (two) times daily. Start with twice a day once having regular BM switch to daily 60 capsule 3  . escitalopram (LEXAPRO) 10 MG tablet Take 1 tablet (10 mg total) by mouth daily. 90 tablet 1  . furosemide (LASIX) 80 MG tablet Take 1 tablet (80 mg total) by mouth 2 (two) times daily. 60 tablet 3  . polyethylene glycol powder (GLYCOLAX/MIRALAX) 17 GM/SCOOP powder Take 17 g by mouth 2 (two)  times daily as needed. 3350 g 3  . sitaGLIPtin (JANUVIA) 25 MG tablet Take 1 tablet (25 mg total) by mouth daily. 90 tablet 3  . ferrous sulfate 325 (65 FE) MG tablet Take 325 mg by mouth daily with breakfast.    . sodium zirconium cyclosilicate (LOKELMA) 10 g PACK packet Take 10 g by mouth 3 (three) times daily.     No current facility-administered medications for this visit.     ROS:  See HPI  Physical Exam: Left UE Incision:Incision healing well Extremities:Palpable thrill at [facility] anastomosis, palpable radial pulse, grip 5/5    +------------+----------+-------------+----------+----------------+  OUTFLOW VEINPSV (cm/s)Diameter (cm)Depth (cm)  Describe    +------------+----------+-------------+----------+----------------+  Prox UA     91    0.71     1.45            +------------+----------+-------------+----------+----------------+  Mid UA     133    0.74     1.09  competing branch  +------------+----------+-------------+----------+----------------+  Dist UA     761    0.70     0.80            +------------+----------+-------------+----------+----------------+  AC Fossa    787    0.27     0.64            +------------+----------+-------------+----------+----------------+   Summary:  Patent arteriovenous fistula with one competing branch observed.  Elevate velocities present at the anastomosis most likely due to retained  valve.   Assessment/Plan:  This is a 65 y.o. female who is s/p: first stage basilic av fistula   The duplex shows good maturity of the fistula diameter.  WE will schedule her for second stage basilic transpostion with Dr. Carlis Abbott.  Her HD is on MWF and she has a working [facility] on the right.    Roxy Horseman PA-C [facility] [phone]  Clinic MD:  Oneida Alar

## 2020-08-05 NOTE — Anesthesia Postprocedure Evaluation (Signed)
Anesthesia Post Note  Patient: Heloise Gordan de Arriola  Procedure(s) Performed: SECOND STAGE BASILIC VEIN TRANSPOSITION (Left Arm Upper)     Patient location during evaluation: PACU Anesthesia Type: Regional and MAC Level of consciousness: awake and alert Pain management: pain level controlled Vital Signs Assessment: post-procedure vital signs reviewed and stable Respiratory status: spontaneous breathing, nonlabored ventilation and respiratory function stable Cardiovascular status: stable and blood pressure returned to baseline Postop Assessment: no apparent nausea or vomiting Anesthetic complications: no   No complications documented.  Last Vitals:  Vitals:   08/05/20 1215 08/05/20 1230  BP: (!) 156/69 (!) 162/71  Pulse: 66 67  Resp: 13 13  Temp:  (!) 36.1 C  SpO2: 96% 97%    Last Pain:  Vitals:   08/05/20 1230  TempSrc:   PainSc: 0-No pain                 Tyqwan Pink,W. EDMOND

## 2020-08-05 NOTE — Op Note (Addendum)
    OPERATIVE NOTE   PROCEDURE: left second stage basilic vein transposition (brachiobasilic arteriovenous fistula) placement  PRE-OPERATIVE DIAGNOSIS: ESRD  POST-OPERATIVE DIAGNOSIS: same  SURGEON: Marty Heck, MD  ASSISTANT(S): Roxy Horseman, PA  ANESTHESIA: regional  ESTIMATED BLOOD LOSS: <50 mL  FINDING(S): Three skip incisions were made in the left upper arm and the basilic vein was circumferentially mobilized and all side branches ligated.  Good caliber vein that appears to have matured nicely.  It was transposed through a new skin tunnel more superficial and lateral and a new end to end anastomosis performed.  Excellent thrill at completion.    SPECIMEN(S):  None  INDICATIONS:   Rachael Burke is a 65 y.o. female who presents with ESRD and need for permanent hemodialysis access.  The patient is scheduled for left second stage basilic vein transposition.  The patient is aware the risks include but are not limited to: bleeding, infection, steal syndrome, nerve damage, ischemic monomelic neuropathy, failure to mature, and need for additional procedures.  The patient is aware of the risks of the procedure and elects to proceed forward.  An assistant was needed for exposure and to expedite the case.   DESCRIPTION: After full informed written consent was obtained from the patient, the patient was brought back to the operating room and placed supine upon the operating table.  Prior to induction, the patient received IV antibiotics.   After obtaining adequate anesthesia, the patient was then prepped and draped in the standard fashion for a left arm access procedure.  I turned my attention first to identifying the patient's brachiobasilic arteriovenous fistula.  Using SonoSite guidance, the location of this fistula was marked out on the skin.    This was an excellent caliber vein.  I made three longitudinal incisions on the medial aspect of the left upper arm.   Through these incisions, I dissected out circumferentially the basilic vein, taking care to protect the nerve.  Once the vein was fully mobilized, all side branches were ligated between silk ties.  The vein was marked for orientation.  I then used a curved tunneler to create a subcutaneous tunnel.  The patient was given 3000 units IV heparin.  The vein was then transected near the antecubital crease.  It was then brought to the previously created tunnel making sure to maintain proper orientation.  A primary anastomosis was then performed between the 2 cut ends of the vein with a running 6-0 Prolene after each cut end was spatulated.  Once this was done the clamps were released.  There was excellent flow through the fistula.  Hemostasis was then achieved.  The wound was irrigated.  The incision was closed with a deep layer of 3-0 Vicryl followed by a subcutaneous 4-0 Monocryl and Dermabond.  There were no immediate complications.  COMPLICATIONS: None  CONDITION: Stable  Marty Heck, MD [facility] Office: [place]   08/05/2020, 11:42 AM

## 2020-08-05 NOTE — Transfer of Care (Signed)
Immediate Anesthesia Transfer of Care Note  Patient: Rachael Burke  Procedure(s) Performed: SECOND STAGE BASILIC VEIN TRANSPOSITION (Left Arm Upper)  Patient Location: PACU  Anesthesia Type:MAC combined with regional for post-op pain  Level of Consciousness: awake, alert , oriented and patient cooperative  Airway & Oxygen Therapy: Patient Spontanous Breathing and Patient connected to nasal cannula oxygen  Post-op Assessment: Report given to RN, Post -op Vital signs reviewed and stable and Patient moving all extremities  Post vital signs: Reviewed and stable   Last Vitals:  Vitals Value Taken Time  BP    Temp    Pulse    Resp 15 08/05/20 1159  SpO2    Vitals shown include unvalidated device data.  Last Pain:  Vitals:   08/05/20 0911  TempSrc:   PainSc: 0-No pain         Complications: No complications documented.

## 2020-08-06 ENCOUNTER — Other Ambulatory Visit: Payer: Self-pay | Admitting: Physician Assistant

## 2020-08-06 ENCOUNTER — Telehealth: Payer: Self-pay

## 2020-08-06 ENCOUNTER — Encounter (HOSPITAL_COMMUNITY): Payer: Self-pay | Admitting: Vascular Surgery

## 2020-08-06 MED ORDER — ONDANSETRON HCL 4 MG PO TABS: 4.0000 mg | ORAL_TABLET | Freq: Two times a day (BID) | ORAL | 0 refills | Status: AC | PRN

## 2020-08-06 NOTE — Telephone Encounter (Addendum)
Patient's daughter left vm about patient experiencing nausea and vomiting s/p BVT on 3/7. Attempted to call back, utr or leave vm.  Reached daughter, patient has been experiencing n/v and dizziness since taking pain medication. Called in zofran and advised to call back if that doesn't help.

## 2020-08-13 ENCOUNTER — Ambulatory Visit
Admission: RE | Admit: 2020-08-13 | Discharge: 2020-08-13 | Disposition: A | Discharge status: Home / Self Care | Payer: Medicaid Other | Source: Ambulatory Visit | Attending: Family Medicine | Admitting: Family Medicine

## 2020-08-13 ENCOUNTER — Other Ambulatory Visit: Payer: Self-pay

## 2020-08-13 DIAGNOSIS — Z1231 Encounter for screening mammogram for malignant neoplasm of breast: Secondary | ICD-10-CM

## 2020-08-19 ENCOUNTER — Ambulatory Visit: Payer: Medicaid Other

## 2020-08-19 ENCOUNTER — Other Ambulatory Visit: Payer: Self-pay

## 2020-08-19 DIAGNOSIS — R29898 Other symptoms and signs involving the musculoskeletal system: Secondary | ICD-10-CM

## 2020-08-19 DIAGNOSIS — M542 Cervicalgia: Secondary | ICD-10-CM | POA: Diagnosis not present

## 2020-08-19 NOTE — Therapy (Signed)
[place], [place], [address] Phone: [phone]   Fax:  [phone]  Physical Therapy Treatment  Patient Details  Name: Rachael Burke MRN: [id number] Date of Birth: 01-Oct-1955 Referring Provider (PT): Just, Laurita Quint, FNP   Encounter Date: 08/19/2020   PT End of Session - 08/19/20 1539    Visit Number 2    Number of Visits 13    Date for PT Re-Evaluation 09/28/20    Authorization Type [place] MEDICAID [employer]    PT Start Time 1533    PT Stop Time 1617    PT Time Calculation (min) 44 min    Equipment Utilized During Treatment --   SBA for safety   Activity Tolerance Patient tolerated treatment well    Behavior During Therapy [facility] for tasks assessed/performed           Past Medical History:  Diagnosis Date  . Anemia   . Anxiety   . Arthritis   . Diabetes mellitus without complication ([place])   . Heart murmur    echo 07/02/20: Mild MR/TR, mild-moderate AV sclerosis, bicuspid or functional bicuspid AV, no evidence of AS. Murmr felt due to AV.  Marland Kitchen History of blood transfusion   . Hyperlipidemia   . Hypertension   . Pneumonia   . PONV (postoperative nausea and vomiting)   . Renal disorder     Past Surgical History:  Procedure Laterality Date  . AV FISTULA PLACEMENT Left 05/20/2020   Procedure: INSERTION OF LEFT ARM ARTERIOVENOUS (AV) FISTULA;  Surgeon: Marty Heck, MD;  Location: [place];  Service: Vascular;  Laterality: Left;  . BASCILIC VEIN TRANSPOSITION Left 08/05/2020   Procedure: SECOND STAGE BASILIC VEIN TRANSPOSITION;  Surgeon: Marty Heck, MD;  Location: [place];  Service: Vascular;  Laterality: Left;  . CESAREAN SECTION    . CHOLECYSTECTOMY    . INSERTION OF DIALYSIS CATHETER N/A 05/20/2020   Procedure: INSERTION OF DIALYSIS CATHETER;  Surgeon: Marty Heck, MD;  Location: [place];  Service: Vascular;  Laterality: N/A;    There were no vitals  filed for this visit.   Subjective Assessment - 08/19/20 1537    Subjective "Feeling a little better. Still a little pain in all that area (along R neck and upper trap)." Pt reports continued vertigo symptoms up to 2-3x/week.    Limitations House hold activities;Other (comment)   sleeping   Patient Stated Goals To have less pain.    Currently in Pain? Yes    Pain Score 2     Pain Location Neck    Pain Orientation Right    Pain Descriptors / Indicators Sore    Pain Onset More than a month ago              [facility] PT Assessment - 08/19/20 0001      Assessment   Medical Diagnosis cervicalgia    Referring Provider (PT) Just, Laurita Quint, FNP      AROM   Overall AROM Comments Pt reported pain with all cervical motions with L SB being the most uncomfortable. Pain during bilateral shoulder AROM - pain in LUE secondary to recent procedure. Pain in right upper trap and cervical spine with RUE AROM.    AROM Assessment Site Cervical    Cervical Flexion 50    Cervical Extension 50   "little bit" pain in R neck   Cervical - Right Side Bend 48    Cervical - Left Side  Bend 42   pain in R neck   Cervical - Right Rotation 56    Cervical - Left Rotation 58   pain in R neck     Palpation   Palpation comment TTP along right upper trap, levator scap, and at lower cervical vertebrae (with downglides and upglides)                         [facility] Adult PT Treatment/Exercise - 08/19/20 0001      Self-Care   Self-Care Other Self-Care Comments    Other Self-Care Comments  See patient education      Neck Exercises: Seated   Neck Retraction 10 reps;3 secs    Neck Retraction Limitations heavy visual, verbal, and tactile cues for technique. performed in supine and sitting to demonstrate how to perform in various positions    Other Seated Exercise scapular retraction 15x with cues to avoid upper trap activation/shoulder shrug    Other Seated Exercise seated right shoulder external rotation  with yellow band x 15 with UE on bolster. static left shoulder holding band      Manual Therapy   Manual Therapy Joint mobilization;Soft tissue mobilization;Myofascial release    Joint Mobilization R cervical upglides and L cervical downglides grades I-III within tolerance    Soft tissue mobilization STM/MFR along R upper trap and levator scap      Neck Exercises: Stretches   Upper Trapezius Stretch Right;1 rep;Other (comment)   90 seconds   Levator Stretch Right;1 rep;Other (comment)   90 seconds                 PT Education - 08/19/20 1709    Education Details Updated and reviewed HEP, postural awareness/control    Person(s) Educated Patient    Methods Explanation;Demonstration;Tactile cues;Verbal cues;Handout    Comprehension Verbalized understanding;Returned demonstration;Verbal cues required;Tactile cues required;Need further instruction            PT Short Term Goals - 07/31/20 1600      PT SHORT TERM GOAL #1   Title Pt will be Ind in an initial HEP    Status New    Target Date 08/28/20      PT SHORT TERM GOAL #3   Title Pt will voice understanding of measures to assist in pain reduction and management    Status New    Target Date 08/28/20             PT Long Term Goals - 07/31/20 1603      PT LONG TERM GOAL #1   Title Increased cervical SB and rotation to 40 and 60d respectively    Baseline See flowsheets    Status New    Target Date 09/28/20      PT LONG TERM GOAL #2   Title Pt will report improved R cervical shoulder pain to 4/10 or less with sleeping and daily activities    Baseline 5-8/10 pain range    Status New    Target Date 09/28/20      PT LONG TERM GOAL #3   Title Pt will be able to demonstrate proper sitting posture without verbal or tactile cues to assist with cervical/shoulder pain reduction    Status New    Target Date 09/28/20      PT LONG TERM GOAL #4   Title Pt will be Ind in a final HEP to maintain or progress achieved level  of function    Status New  Target Date 09/28/20                 Plan - 08/19/20 1710    Clinical Impression Statement Pt presents to PT for the first time since evaluation secondary to undergoing Basilic Vein Transposition procedure 08/05/2020. She continues to have pain in right side of cervical spine and right shoulder but expresses that the severity of her pain has decreased over the past several weeks. She demonstrates improvement in cervical AROM but continues to have minor right-sided neck/upper trap pain with cervical left sidebending, left rotation, and extension. Pt continues to have unsteadiness and dizziness that she states occurs primarily after dialysis. She explains that her blood pressure varies and is often higher than normal ranges. Discussed staying in static position following transfers (supine>sit>stand) prior to initiating movement. She should benefit from skilled PT to improve posture and address deficits for improved function.    Personal Factors and Comorbidities Age;Time since onset of injury/illness/exacerbation;Comorbidity 1;Comorbidity 2;Comorbidity 3+    Comorbidities DM, arthritis, anxiety    Examination-Activity Limitations Lift;Reach Overhead;Carry    Examination-Participation Restrictions Other   Daily activities   Stability/Clinical Decision Making Evolving/Moderate complexity    Rehab Potential Good    PT Frequency 2x / week    PT Duration 6 weeks    PT Treatment/Interventions ADLs/Self Care Home Management;Cryotherapy;Electrical Stimulation;Ultrasound;Traction;Moist Heat;Iontophoresis '4mg'$ /ml Dexamethasone;Gait training;Therapeutic activities;Therapeutic exercise;Balance training;Manual techniques;Patient/family education;Passive range of motion;Dry needling;Spinal Manipulations;Taping    PT Next Visit Plan Assess repsonse to HEP. Use of manual techniques and modalities as indicated. Postural Ed.    PT Home Exercise Plan [id number]    Consulted and Agree  with Plan of Care Patient           Patient will benefit from skilled therapeutic intervention in order to improve the following deficits and impairments:  Difficulty walking,Decreased range of motion,Dizziness,Pain,Impaired UE functional use,Postural dysfunction,Decreased balance  Visit Diagnosis: Cervicalgia  Decreased ROM of neck     Problem List Patient Active Problem List   Diagnosis Date Noted  . End stage renal disease on dialysis ([place]) 07/23/2020  . Generalized anxiety disorder 07/23/2020  . Acute renal failure superimposed on chronic kidney disease ([place]) 12/29/2019  . Anemia associated with chronic renal failure 12/29/2019  . Symptomatic anemia 04/08/2019  . Hyperlipidemia 04/08/2019  . AKI (acute kidney injury) ([place]) 04/07/2019  . Hypertension associated with diabetes ([place]) 04/06/2019  . Type 2 diabetes mellitus with complication, without long-term current use of insulin ([place]) 01/04/2018     Haydee Monica, PT, DPT 08/19/20 5:32 PM  [place] [facility] [address] [place], [place], [address] Phone: [phone]   Fax:  [phone]  Name: Rachael Burke MRN: [id number] Date of Birth: 1955/08/15

## 2020-08-21 ENCOUNTER — Other Ambulatory Visit: Payer: Self-pay

## 2020-08-21 ENCOUNTER — Ambulatory Visit: Payer: Medicaid Other

## 2020-08-21 DIAGNOSIS — M542 Cervicalgia: Secondary | ICD-10-CM | POA: Diagnosis not present

## 2020-08-21 DIAGNOSIS — R29898 Other symptoms and signs involving the musculoskeletal system: Secondary | ICD-10-CM

## 2020-08-21 NOTE — Therapy (Signed)
[place], [place], [address] Phone: [phone]   Fax:  [phone]  Physical Therapy Treatment  Patient Details  Name: Rachael Burke MRN: [id number] Date of Birth: [birth year] Referring Provider (PT): Just, Laurita Quint, FNP   Encounter Date: 08/21/2020   PT End of Session - 08/21/20 1130    Visit Number 3    Number of Visits 13    Date for PT Re-Evaluation 09/28/20    Authorization Type [place] MEDICAID [employer]    PT Start Time 1130    PT Stop Time 1210    PT Time Calculation (min) 40 min    Equipment Utilized During Treatment --   SBA for safety   Activity Tolerance Patient tolerated treatment well    Behavior During Therapy [facility] for tasks assessed/performed           Past Medical History:  Diagnosis Date  . Anemia   . Anxiety   . Arthritis   . Diabetes mellitus without complication ([place])   . Heart murmur    echo 07/02/20: Mild MR/TR, mild-moderate AV sclerosis, bicuspid or functional bicuspid AV, no evidence of AS. Murmr felt due to AV.  Marland Kitchen History of blood transfusion   . Hyperlipidemia   . Hypertension   . Pneumonia   . PONV (postoperative nausea and vomiting)   . Renal disorder     Past Surgical History:  Procedure Laterality Date  . AV FISTULA PLACEMENT Left 05/20/2020   Procedure: INSERTION OF LEFT ARM ARTERIOVENOUS (AV) FISTULA;  Surgeon: Marty Heck, MD;  Location: [place];  Service: Vascular;  Laterality: Left;  . BASCILIC VEIN TRANSPOSITION Left 08/05/2020   Procedure: SECOND STAGE BASILIC VEIN TRANSPOSITION;  Surgeon: Marty Heck, MD;  Location: [place];  Service: Vascular;  Laterality: Left;  . CESAREAN SECTION    . CHOLECYSTECTOMY    . INSERTION OF DIALYSIS CATHETER N/A 05/20/2020   Procedure: INSERTION OF DIALYSIS CATHETER;  Surgeon: Marty Heck, MD;  Location: [place];  Service: Vascular;  Laterality: N/A;    There were no vitals  filed for this visit.   Subjective Assessment - 08/21/20 1130    Subjective Pt denies pain upon arrival. She states she felt okay after last PT session.    Limitations House hold activities;Other (comment)   sleeping   Patient Stated Goals To have less pain.    Currently in Pain? No/denies    Pain Score 0-No pain    Pain Onset More than a month ago              [facility] PT Assessment - 08/21/20 0001      Assessment   Medical Diagnosis cervicalgia    Referring Provider (PT) Just, Laurita Quint, FNP                         [facility] Adult PT Treatment/Exercise - 08/21/20 0001      Self-Care   Self-Care Other Self-Care Comments    Other Self-Care Comments  See patient education      Neck Exercises: Machines for Strengthening   UBE (Upper Arm Bike) L1.5 x 2 min forward; L1 x 2 min backward      Neck Exercises: Theraband   Shoulder Extension 15 reps;Red    Rows 15 reps;Red      Neck Exercises: Standing   Other Standing Exercises serratus wall slides x 20      Neck  Exercises: Seated   Other Seated Exercise scapular retraction 20x with cues to avoid upper trap activation/shoulder shrug    Other Seated Exercise seated right shoulder external rotation with yellow band x 20 with UE on bolster. static left shoulder holding band      Neck Exercises: Supine   Neck Retraction 15 reps    Neck Retraction Limitations verbal, visual, and tactile cues for technique    Other Supine Exercise right shoulder scaption with yellow band x 15; right shoulder horizontal ABD with yellow band x 15      Neck Exercises: Stretches   Upper Trapezius Stretch Right;5 reps;20 seconds    Levator Stretch Right;1 rep;Other (comment)   90 seconds                 PT Education - 08/21/20 1551    Education Details HEP. Discussed decreasing frequency to 1x/week if patient is still doing well with minimal to no pain during Monday's visit then D/C if she continues to do well afterwards performing  HEP independently. Provided red and yellow therabands.    Person(s) Educated Patient    Methods Explanation;Demonstration;Tactile cues;Verbal cues    Comprehension Verbalized understanding;Returned demonstration;Verbal cues required;Tactile cues required;Need further instruction            PT Short Term Goals - 07/31/20 1600      PT SHORT TERM GOAL #1   Title Pt will be Ind in an initial HEP    Status New    Target Date 08/28/20      PT SHORT TERM GOAL #3   Title Pt will voice understanding of measures to assist in pain reduction and management    Status New    Target Date 08/28/20             PT Long Term Goals - 07/31/20 1603      PT LONG TERM GOAL #1   Title Increased cervical SB and rotation to 40 and 60d respectively    Baseline See flowsheets    Status New    Target Date 09/28/20      PT LONG TERM GOAL #2   Title Pt will report improved R cervical shoulder pain to 4/10 or less with sleeping and daily activities    Baseline 5-8/10 pain range    Status New    Target Date 09/28/20      PT LONG TERM GOAL #3   Title Pt will be able to demonstrate proper sitting posture without verbal or tactile cues to assist with cervical/shoulder pain reduction    Status New    Target Date 09/28/20      PT LONG TERM GOAL #4   Title Pt will be Ind in a final HEP to maintain or progress achieved level of function    Status New    Target Date 09/28/20                 Plan - 08/21/20 1130    Clinical Impression Statement Patient tolerated PT session well with no adverse effects. She did not complain of pain with interventions but did fatigue quickly during exercises and after 1.5 minutes on UBE, requiring brief rest break. She was able to progress to additional theraband interventions. Attempted red theraband for right shoulder external rotation (seated with elbows on bolster) but pt was unable to perform with optimal technique due to difficulty of resistance, so it was  performed for increased repetitions using yellow theraband. She did not have complaints  of dizziness this session. She should continue to benefit from skilled PT intervention to improve functional strength and tolerance with daily activities.    Personal Factors and Comorbidities Age;Time since onset of injury/illness/exacerbation;Comorbidity 1;Comorbidity 2;Comorbidity 3+    Comorbidities DM, arthritis, anxiety    Examination-Activity Limitations Lift;Reach Overhead;Carry    Examination-Participation Restrictions Other   Daily activities   Stability/Clinical Decision Making Evolving/Moderate complexity    Rehab Potential Good    PT Frequency 2x / week    PT Duration 6 weeks    PT Treatment/Interventions ADLs/Self Care Home Management;Cryotherapy;Electrical Stimulation;Ultrasound;Traction;Moist Heat;Iontophoresis '4mg'$ /ml Dexamethasone;Gait training;Therapeutic activities;Therapeutic exercise;Balance training;Manual techniques;Patient/family education;Passive range of motion;Dry needling;Spinal Manipulations;Taping    PT Next Visit Plan Update HEP PRN, manual techniques as indicated, continue postural education. Decrease frequency of visits potentially.    PT Home Exercise Plan [id number]    Consulted and Agree with Plan of Care Patient           Patient will benefit from skilled therapeutic intervention in order to improve the following deficits and impairments:  Difficulty walking,Decreased range of motion,Dizziness,Pain,Impaired UE functional use,Postural dysfunction,Decreased balance  Visit Diagnosis: Cervicalgia  Decreased ROM of neck     Problem List Patient Active Problem List   Diagnosis Date Noted  . End stage renal disease on dialysis ([place]) 07/23/2020  . Generalized anxiety disorder 07/23/2020  . Acute renal failure superimposed on chronic kidney disease ([place]) 12/29/2019  . Anemia associated with chronic renal failure 12/29/2019  . Symptomatic anemia 04/08/2019  .  Hyperlipidemia 04/08/2019  . AKI (acute kidney injury) ([place]) 04/07/2019  . Hypertension associated with diabetes ([place]) 04/06/2019  . Type 2 diabetes mellitus with complication, without long-term current use of insulin ([place]) 01/04/2018    Haydee Monica, PT, DPT 08/21/20 4:01 PM  [facility] [address] [place], [place], [address] Phone: [phone]   Fax:  [phone]  Name: Rachael Burke MRN: [id number] Date of Birth: 1955/10/16

## 2020-08-26 ENCOUNTER — Ambulatory Visit: Payer: Medicaid Other

## 2020-08-26 ENCOUNTER — Other Ambulatory Visit: Payer: Self-pay

## 2020-08-26 DIAGNOSIS — M542 Cervicalgia: Secondary | ICD-10-CM

## 2020-08-26 DIAGNOSIS — R29898 Other symptoms and signs involving the musculoskeletal system: Secondary | ICD-10-CM

## 2020-08-26 NOTE — Therapy (Signed)
[place], [place], [address] Phone: [phone]   Fax:  [phone]  Physical Therapy Treatment  Patient Details  Name: Rachael Burke MRN: [id number] Date of Birth: [birth year] Referring Provider (PT): Just, Laurita Quint, FNP   Encounter Date: 08/26/2020   PT End of Session - 08/26/20 1357    Visit Number 4    Number of Visits 13    Date for PT Re-Evaluation 09/28/20    Authorization Type [place] MEDICAID [employer]    PT Start Time 1400    PT Stop Time 1445    PT Time Calculation (min) 45 min    Equipment Utilized During Treatment --   SBA for safety   Activity Tolerance Patient tolerated treatment well    Behavior During Therapy [facility] for tasks assessed/performed           Past Medical History:  Diagnosis Date  . Anemia   . Anxiety   . Arthritis   . Diabetes mellitus without complication ([place])   . Heart murmur    echo 07/02/20: Mild MR/TR, mild-moderate AV sclerosis, bicuspid or functional bicuspid AV, no evidence of AS. Murmr felt due to AV.  Marland Kitchen History of blood transfusion   . Hyperlipidemia   . Hypertension   . Pneumonia   . PONV (postoperative nausea and vomiting)   . Renal disorder     Past Surgical History:  Procedure Laterality Date  . AV FISTULA PLACEMENT Left 05/20/2020   Procedure: INSERTION OF LEFT ARM ARTERIOVENOUS (AV) FISTULA;  Surgeon: Marty Heck, MD;  Location: [place];  Service: Vascular;  Laterality: Left;  . BASCILIC VEIN TRANSPOSITION Left 08/05/2020   Procedure: SECOND STAGE BASILIC VEIN TRANSPOSITION;  Surgeon: Marty Heck, MD;  Location: [place];  Service: Vascular;  Laterality: Left;  . CESAREAN SECTION    . CHOLECYSTECTOMY    . INSERTION OF DIALYSIS CATHETER N/A 05/20/2020   Procedure: INSERTION OF DIALYSIS CATHETER;  Surgeon: Marty Heck, MD;  Location: [place];  Service: Vascular;  Laterality: N/A;    There were no vitals  filed for this visit.   Subjective Assessment - 08/26/20 1357    Subjective Pt denies pain upon arrival. She states she has little pain sometimes with arm and neck movement but much less than before.    Limitations House hold activities;Other (comment)   sleeping   Patient Stated Goals To have less pain.    Currently in Pain? No/denies    Pain Score 0-No pain    Pain Onset More than a month ago              [facility] PT Assessment - 08/26/20 0001      Assessment   Medical Diagnosis cervicalgia    Referring Provider (PT) Just, Laurita Quint, FNP      AROM   AROM Assessment Site Cervical;Shoulder    Right/Left Shoulder Right;Left    Right Shoulder Flexion 146 Degrees   115 degrees then demonstrates R shoulder shrug   Right Shoulder ABduction 162 Degrees    Right Shoulder Internal Rotation --   L3   Right Shoulder External Rotation --   T3   Left Shoulder Flexion 146 Degrees    Left Shoulder ABduction 154 Degrees    Left Shoulder Internal Rotation --   T10   Left Shoulder External Rotation --   T3   Cervical Flexion 76    Cervical Extension 50    Cervical -  Right Side Bend 48    Cervical - Left Side Bend 44    Cervical - Right Rotation 78    Cervical - Left Rotation 82      Strength   Overall Strength Comments BUE MMT 4/5 grossly      Palpation   Palpation comment TTP along R upper trap and levator scap                         [facility] Adult PT Treatment/Exercise - 08/26/20 0001      Self-Care   Self-Care Other Self-Care Comments    Other Self-Care Comments  See patient education      Neck Exercises: Machines for Strengthening   UBE (Upper Arm Bike) L2 x 4 min (2 min forward; L1 x 2 min backward)      Neck Exercises: Theraband   Shoulder Extension 20 reps;Red    Rows 20 reps;Red    Shoulder External Rotation 15 reps;Red;Other (comment)   bilateral   Shoulder Internal Rotation 15 reps;Red   bilateral   Other Theraband Exercises tricep extension x 20 with red  band bilaterally      Neck Exercises: Standing   Other Standing Exercises wall ball with green medicine ball clockwise and counterclockwise x 30 sec each    Other Standing Exercises serratus wall slides with pool slides x 15      Neck Exercises: Stretches   Upper Trapezius Stretch Right;1 rep;60 seconds    Levator Stretch Right;1 rep;60 seconds                  PT Education - 08/26/20 1502    Education Details Updated and reviewed HEP. Discussed POC and decreased frequency to 1x/week with instructions to call and cancel if patient is still feeling well and does not plan to return to PT following her trip to [place].    Person(s) Educated Patient    Methods Explanation;Demonstration;Tactile cues;Verbal cues    Comprehension Verbalized understanding;Returned demonstration;Verbal cues required;Tactile cues required;Need further instruction            PT Short Term Goals - 08/26/20 1358      PT SHORT TERM GOAL #1   Title Pt will be Ind in an initial HEP    Status Achieved    Target Date 08/28/20      PT SHORT TERM GOAL #3   Title Pt will voice understanding of measures to assist in pain reduction and management    Status Achieved    Target Date 08/28/20             PT Long Term Goals - 08/26/20 1437      PT LONG TERM GOAL #1   Title Increased cervical SB and rotation to 40 and 60d respectively    Baseline See flowsheets    Status Achieved      PT LONG TERM GOAL #2   Title Pt will report improved R cervical shoulder pain to 4/10 or less with sleeping and daily activities    Baseline Pt reports she has been sleeping well    Status Achieved      PT LONG TERM GOAL #3   Title Pt will be able to demonstrate proper sitting posture without verbal or tactile cues to assist with cervical/shoulder pain reduction    Status On-going      PT LONG TERM GOAL #4   Title Pt will be Ind in a final HEP to maintain or  progress achieved level of function    Status On-going                  Plan - 08/26/20 1457    Clinical Impression Statement Patient tolerated PT session well with no adverse effects but continues to fatigue quickly during interventions. She demonstrates improved cervical and BUE AROM with minimal to no pain reported. She is planning to travel to [place] this Thursday and will be returning after about a week. Discussed POC and decreased frequency once patient returns from her trip. Advised patient to call if she continues feeling well without pain and decides not to continue with PT but discussed that we will continue muscle strength and endurance training otherwise so she is able to tolerate more activities before feeling tired. She should benefit from skilled PT once she returns to improve functional strength and tolerance with daily activities with decreased fatigue.    Personal Factors and Comorbidities Age;Time since onset of injury/illness/exacerbation;Comorbidity 1;Comorbidity 2;Comorbidity 3+    Comorbidities DM, arthritis, anxiety    Examination-Activity Limitations Lift;Reach Overhead;Carry    Examination-Participation Restrictions Other   Daily activities   Stability/Clinical Decision Making Evolving/Moderate complexity    Rehab Potential Good    PT Frequency 2x / week    PT Duration 6 weeks    PT Treatment/Interventions ADLs/Self Care Home Management;Cryotherapy;Electrical Stimulation;Ultrasound;Traction;Moist Heat;Iontophoresis '4mg'$ /ml Dexamethasone;Gait training;Therapeutic activities;Therapeutic exercise;Balance training;Manual techniques;Patient/family education;Passive range of motion;Dry needling;Spinal Manipulations;Taping    PT Next Visit Plan Update HEP PRN, manual techniques as indicated, continue postural education. Continue strengthening as tolerated.    PT Home Exercise Plan [id number]    Consulted and Agree with Plan of Care Patient           Patient will benefit from skilled therapeutic intervention in order to improve  the following deficits and impairments:  Difficulty walking,Decreased range of motion,Dizziness,Pain,Impaired UE functional use,Postural dysfunction,Decreased balance  Visit Diagnosis: Cervicalgia  Decreased ROM of neck     Problem List Patient Active Problem List   Diagnosis Date Noted  . End stage renal disease on dialysis ([place]) 07/23/2020  . Generalized anxiety disorder 07/23/2020  . Acute renal failure superimposed on chronic kidney disease ([place]) 12/29/2019  . Anemia associated with chronic renal failure 12/29/2019  . Symptomatic anemia 04/08/2019  . Hyperlipidemia 04/08/2019  . AKI (acute kidney injury) ([place]) 04/07/2019  . Hypertension associated with diabetes ([place]) 04/06/2019  . Type 2 diabetes mellitus with complication, without long-term current use of insulin ([place]) 01/04/2018    Haydee Monica, PT, DPT 08/26/20 3:13 PM  [facility] [address] [place], [place], [address] Phone: [phone]   Fax:  [phone]  Name: Marveline Klostermann MRN: [id number] Date of Birth: [birth year]

## 2020-08-27 ENCOUNTER — Ambulatory Visit (INDEPENDENT_AMBULATORY_CARE_PROVIDER_SITE_OTHER): Payer: Medicaid Other | Admitting: Physician Assistant

## 2020-08-27 VITALS — BP 154/74 | HR 80 | Temp 98.1°F | Resp 20 | Ht 62.0 in | Wt 154.2 lb

## 2020-08-27 DIAGNOSIS — N186 End stage renal disease: Secondary | ICD-10-CM

## 2020-08-27 DIAGNOSIS — Z992 Dependence on renal dialysis: Secondary | ICD-10-CM

## 2020-08-27 NOTE — Progress Notes (Signed)
    Postoperative Access Visit   History of Present Illness   Rachael Burke is a 65 y.o. year old female who presents for postoperative follow-up for: left second stage basilic vein transposition by Dr. Donnetta Hutching  (Date: 08/05/20).  The patient's wounds are healed.  The patient denies steal symptoms.  The patient is able to complete their activities of daily living. She is currently via R IJ TDC dialyzing on a MWF schedule at the [facility] center on [address].  She is going on a trip next week and would like to continue using her [facility] until she returns.  Her daughter is translating for [place] today.  Physical Examination   Vitals:   08/27/20 1533  BP: (!) 154/74  Pulse: 80  Resp: 20  Temp: 98.1 F (36.7 C)  TempSrc: Temporal  SpO2: 98%  Weight: 154 lb 3.2 oz (69.9 kg)  Height: '5\' 2"'$  (1.575 m)   Body mass index is 28.2 kg/m.  left arm Incisions are healed, palpable radial pulse, hand grip is 5/5, sensation in digits is intact, palpable thrill, bruit can be auscultated     Medical Decision Making   Rachael Burke is a 65 y.o. year old female who presents s/p left second stage basilic vein transposition   Patent brachiobasilic fistula without signs or symptoms of steal syndrome  The patient's access will be ready for use 09/09/20  The patient's tunneled dialysis catheter can be removed when Nephrology is comfortable with the performance of the fistula  The patient may follow up on a prn basis   Dagoberto Ligas PA-C [facility] Office: [facility] MD: Stanford Breed

## 2020-08-28 ENCOUNTER — Ambulatory Visit: Payer: Medicaid Other

## 2020-08-29 DIAGNOSIS — N186 End stage renal disease: Secondary | ICD-10-CM | POA: Diagnosis not present

## 2020-08-29 DIAGNOSIS — Z992 Dependence on renal dialysis: Secondary | ICD-10-CM | POA: Diagnosis not present

## 2020-08-29 DIAGNOSIS — E1129 Type 2 diabetes mellitus with other diabetic kidney complication: Secondary | ICD-10-CM | POA: Diagnosis not present

## 2020-09-12 ENCOUNTER — Ambulatory Visit: Payer: Medicaid Other

## 2020-09-12 DIAGNOSIS — Z992 Dependence on renal dialysis: Secondary | ICD-10-CM | POA: Diagnosis not present

## 2020-09-12 DIAGNOSIS — E1122 Type 2 diabetes mellitus with diabetic chronic kidney disease: Secondary | ICD-10-CM | POA: Diagnosis not present

## 2020-09-12 DIAGNOSIS — I10 Essential (primary) hypertension: Secondary | ICD-10-CM | POA: Diagnosis not present

## 2020-09-12 DIAGNOSIS — K5909 Other constipation: Secondary | ICD-10-CM | POA: Diagnosis not present

## 2020-09-12 DIAGNOSIS — E782 Mixed hyperlipidemia: Secondary | ICD-10-CM | POA: Diagnosis not present

## 2020-09-12 DIAGNOSIS — R110 Nausea: Secondary | ICD-10-CM | POA: Diagnosis not present

## 2020-09-17 DIAGNOSIS — N186 End stage renal disease: Secondary | ICD-10-CM | POA: Diagnosis not present

## 2020-09-17 DIAGNOSIS — Z01818 Encounter for other preprocedural examination: Secondary | ICD-10-CM | POA: Diagnosis not present

## 2020-09-19 ENCOUNTER — Ambulatory Visit: Payer: Medicaid Other | Attending: Family Medicine

## 2020-09-19 ENCOUNTER — Other Ambulatory Visit: Payer: Self-pay

## 2020-09-19 DIAGNOSIS — M542 Cervicalgia: Secondary | ICD-10-CM

## 2020-09-19 DIAGNOSIS — R29898 Other symptoms and signs involving the musculoskeletal system: Secondary | ICD-10-CM | POA: Diagnosis present

## 2020-09-19 NOTE — Therapy (Signed)
[facility] [address] [place], [place], [address] Phone: [phone]   Fax:  [phone]  Physical Therapy Treatment/Discharge  Patient Details  Name: Rachael Burke MRN: [id number] Date of Birth: 12/14/1955 Referring Provider (PT): Just, Kelsea J, FNP   Encounter Date: 09/19/2020   PT End of Session - 09/19/20 1409    Visit Number 5    Number of Visits 13    Date for PT Re-Evaluation 09/28/20    Authorization Type [place] MEDICAID [employer]    PT Start Time 1400    PT Stop Time 1442    PT Time Calculation (min) 42 min    Activity Tolerance Patient tolerated treatment well    Behavior During Therapy WFL for tasks assessed/performed           Past Medical History:  Diagnosis Date  . Anemia   . Anxiety   . Arthritis   . Diabetes mellitus without complication (HCC)   . Heart murmur    echo 07/02/20: Mild MR/TR, mild-moderate AV sclerosis, bicuspid or functional bicuspid AV, no evidence of AS. Murmr felt due to AV.  . History of blood transfusion   . Hyperlipidemia   . Hypertension   . Pneumonia   . PONV (postoperative nausea and vomiting)   . Renal disorder     Past Surgical History:  Procedure Laterality Date  . AV FISTULA PLACEMENT Left 05/20/2020   Procedure: INSERTION OF LEFT ARM ARTERIOVENOUS (AV) FISTULA;  Surgeon: Clark, Christopher J, MD;  Location: [facility] OR;  Service: Vascular;  Laterality: Left;  . BASCILIC VEIN TRANSPOSITION Left 08/05/2020   Procedure: SECOND STAGE BASILIC VEIN TRANSPOSITION;  Surgeon: Clark, Christopher J, MD;  Location: [facility] OR;  Service: Vascular;  Laterality: Left;  . CESAREAN SECTION    . CHOLECYSTECTOMY    . INSERTION OF DIALYSIS CATHETER N/A 05/20/2020   Procedure: INSERTION OF DIALYSIS CATHETER;  Surgeon: Clark, Christopher J, MD;  Location: [facility] OR;  Service: Vascular;  Laterality: N/A;    There were no vitals filed for this visit.   Subjective Assessment -  09/19/20 1411    Subjective Pt reports she is not experiencing pain today. .She doe have pain about every 2 weeks which lasts for a day, but it resolves. Inconsistent with HEP.    Limitations House hold activities;Other (comment)    Patient Stated Goals To have less pain.    Currently in Pain? No/denies    Pain Score 0-No pain    Pain Location Arm    Pain Orientation Left    Pain Descriptors / Indicators Sore    Pain Type Chronic pain    Pain Onset More than a month ago    Pain Frequency Occasional                             [facility] Adult PT Treatment/Exercise - 09/19/20 0001      Neck Exercises: Theraband   Shoulder Extension 10 reps;Red   2 sets   Rows 10 reps;Red   2 sets   Shoulder External Rotation 10 reps;Red      Neck Exercises: Supine   Neck Retraction 10 reps   3 sec   Cervical Rotation Right;Left;5 reps   3 sec     Neck Exercises: Stretches   Upper Trapezius Stretch Right;Left;20 seconds;2 reps    Levator Stretch Right;Left;2 reps;20 seconds                    PT Education - 09/19/20 2204    Education Details Final HEP    Person(s) Educated Patient    Methods Explanation;Demonstration;Tactile cues;Verbal cues;Handout    Comprehension Verbalized understanding;Returned demonstration;Verbal cues required;Tactile cues required            PT Short Term Goals - 08/26/20 1358      PT SHORT TERM GOAL #1   Title Pt will be Ind in an initial HEP    Status Achieved    Target Date 08/28/20      PT SHORT TERM GOAL #3   Title Pt will voice understanding of measures to assist in pain reduction and management    Status Achieved    Target Date 08/28/20             PT Long Term Goals - 09/19/20 2209      PT LONG TERM GOAL #1   Title Increased cervical SB and rotation to 40 and 60d respectively    Baseline See flowsheets    Status Achieved      PT LONG TERM GOAL #2   Title Pt will report improved R cervical shoulder pain to 4/10 or less  with sleeping and daily activities    Baseline Pt reports she has been sleeping well    Status Achieved      PT LONG TERM GOAL #3   Title Pt will be able to demonstrate proper sitting posture without verbal or tactile cues to assist with cervical/shoulder pain reduction    Status Achieved    Target Date 09/19/20      PT LONG TERM GOAL #4   Title Pt will be Ind in a final HEP to maintain or progress achieved level of function    Status Achieved    Target Date 09/19/20                 Plan - 09/19/20 2206    Clinical Impression Statement Pt has made good progress with re: R arm and cervical pain and mobility. Pt is pleased with her progress and is only having pain occasionally. Pt is Ind c a HEP, but is inconsistent in completing. Pt has met all goals, and is in aggreement with DC.    Personal Factors and Comorbidities Age;Time since onset of injury/illness/exacerbation;Comorbidity 1;Comorbidity 2;Comorbidity 3+    Comorbidities DM, arthritis, anxiety    Examination-Activity Limitations Lift;Reach Overhead;Carry    Examination-Participation Restrictions Other    Stability/Clinical Decision Making Evolving/Moderate complexity    Clinical Decision Making Moderate    PT Treatment/Interventions ADLs/Self Care Home Management;Cryotherapy;Electrical Stimulation;Ultrasound;Traction;Moist Heat;Iontophoresis 4mg/ml Dexamethasone;Gait training;Therapeutic activities;Therapeutic exercise;Balance training;Manual techniques;Patient/family education;Passive range of motion;Dry needling;Spinal Manipulations;Taping    PT Home Exercise Plan [email or web address]    Consulted and Agree with Plan of Care Patient           Patient will benefit from skilled therapeutic intervention in order to improve the following deficits and impairments:  Difficulty walking,Decreased range of motion,Dizziness,Pain,Impaired UE functional use,Postural dysfunction,Decreased balance  Visit Diagnosis: Cervicalgia  Decreased  ROM of neck     Problem List Patient Active Problem List   Diagnosis Date Noted  . End stage renal disease on dialysis (HCC) 07/23/2020  . Generalized anxiety disorder 07/23/2020  . Acute renal failure superimposed on chronic kidney disease (HCC) 12/29/2019  . Anemia associated with chronic renal failure 12/29/2019  . Symptomatic anemia 04/08/2019  . Hyperlipidemia 04/08/2019  . AKI (acute kidney injury) (HCC) 04/07/2019  . Hypertension associated with diabetes (HCC) 04/06/2019  .   Type 2 diabetes mellitus with complication, without long-term current use of insulin ([place]) 01/04/2018    PHYSICAL THERAPY DISCHARGE SUMMARY  Visits from Start of Care: 5  Current functional level related to goals / functional outcomes: See above   Remaining deficits: See above   Education / Equipment: HEP Plan: Patient agrees to discharge.  Patient goals were met. Patient is being discharged due to meeting the stated rehab goals.  ?????       Gar Ponto MS, PT 09/19/20 10:32 PM  [facility] [address] [place], [place], [address] Phone: [phone]   Fax:  [phone]  Name: Rachael Burke MRN: [id number] Date of Birth: 1955-10-29

## 2020-09-26 DIAGNOSIS — E782 Mixed hyperlipidemia: Secondary | ICD-10-CM | POA: Diagnosis not present

## 2020-09-26 DIAGNOSIS — R110 Nausea: Secondary | ICD-10-CM | POA: Diagnosis not present

## 2020-09-26 DIAGNOSIS — N186 End stage renal disease: Secondary | ICD-10-CM | POA: Diagnosis not present

## 2020-09-26 DIAGNOSIS — R531 Weakness: Secondary | ICD-10-CM | POA: Diagnosis not present

## 2020-09-26 DIAGNOSIS — R21 Rash and other nonspecific skin eruption: Secondary | ICD-10-CM | POA: Diagnosis not present

## 2020-09-26 DIAGNOSIS — R269 Unspecified abnormalities of gait and mobility: Secondary | ICD-10-CM | POA: Diagnosis not present

## 2020-09-26 DIAGNOSIS — E1122 Type 2 diabetes mellitus with diabetic chronic kidney disease: Secondary | ICD-10-CM | POA: Diagnosis not present

## 2020-09-26 DIAGNOSIS — K219 Gastro-esophageal reflux disease without esophagitis: Secondary | ICD-10-CM | POA: Diagnosis not present

## 2020-09-26 DIAGNOSIS — K5909 Other constipation: Secondary | ICD-10-CM | POA: Diagnosis not present

## 2020-09-26 DIAGNOSIS — I10 Essential (primary) hypertension: Secondary | ICD-10-CM | POA: Diagnosis not present

## 2020-09-26 DIAGNOSIS — Z992 Dependence on renal dialysis: Secondary | ICD-10-CM | POA: Diagnosis not present

## 2020-09-28 DIAGNOSIS — E1129 Type 2 diabetes mellitus with other diabetic kidney complication: Secondary | ICD-10-CM | POA: Diagnosis not present

## 2020-09-28 DIAGNOSIS — Z992 Dependence on renal dialysis: Secondary | ICD-10-CM | POA: Diagnosis not present

## 2020-09-28 DIAGNOSIS — N186 End stage renal disease: Secondary | ICD-10-CM | POA: Diagnosis not present

## 2020-09-29 DIAGNOSIS — N186 End stage renal disease: Secondary | ICD-10-CM | POA: Diagnosis not present

## 2020-09-29 DIAGNOSIS — E1129 Type 2 diabetes mellitus with other diabetic kidney complication: Secondary | ICD-10-CM | POA: Diagnosis not present

## 2020-09-29 DIAGNOSIS — Z992 Dependence on renal dialysis: Secondary | ICD-10-CM | POA: Diagnosis not present

## 2020-10-14 ENCOUNTER — Telehealth: Payer: Self-pay | Admitting: *Deleted

## 2020-10-14 ENCOUNTER — Other Ambulatory Visit: Payer: Self-pay | Admitting: *Deleted

## 2020-10-14 NOTE — Telephone Encounter (Signed)
Pt called to get appt for dialysis catheter removal. She has been scheduled for 5/24 at 1:00

## 2020-10-15 DIAGNOSIS — R531 Weakness: Secondary | ICD-10-CM | POA: Diagnosis not present

## 2020-10-15 DIAGNOSIS — R269 Unspecified abnormalities of gait and mobility: Secondary | ICD-10-CM | POA: Diagnosis not present

## 2020-10-15 DIAGNOSIS — K5909 Other constipation: Secondary | ICD-10-CM | POA: Diagnosis not present

## 2020-10-15 DIAGNOSIS — R42 Dizziness and giddiness: Secondary | ICD-10-CM | POA: Diagnosis not present

## 2020-10-15 DIAGNOSIS — R110 Nausea: Secondary | ICD-10-CM | POA: Diagnosis not present

## 2020-10-15 DIAGNOSIS — E782 Mixed hyperlipidemia: Secondary | ICD-10-CM | POA: Diagnosis not present

## 2020-10-15 DIAGNOSIS — Z992 Dependence on renal dialysis: Secondary | ICD-10-CM | POA: Diagnosis not present

## 2020-10-15 DIAGNOSIS — K219 Gastro-esophageal reflux disease without esophagitis: Secondary | ICD-10-CM | POA: Diagnosis not present

## 2020-10-15 DIAGNOSIS — E1122 Type 2 diabetes mellitus with diabetic chronic kidney disease: Secondary | ICD-10-CM | POA: Diagnosis not present

## 2020-10-15 DIAGNOSIS — N186 End stage renal disease: Secondary | ICD-10-CM | POA: Diagnosis not present

## 2020-10-15 DIAGNOSIS — I10 Essential (primary) hypertension: Secondary | ICD-10-CM | POA: Diagnosis not present

## 2020-10-21 ENCOUNTER — Telehealth: Payer: Self-pay

## 2020-10-21 NOTE — Telephone Encounter (Signed)
Contacted infusion center scheduling. Advised to cancel pt's TDC removal on tomorrow due to notification from pt family, catheter has already been removed.

## 2020-10-21 NOTE — Telephone Encounter (Signed)
Pt's daughter called to cancel pt's procedure to remove her catheter tomorrow. She stated it has already been removed. Surgery scheduler has been made aware.

## 2020-10-22 ENCOUNTER — Inpatient Hospital Stay (HOSPITAL_COMMUNITY): Admission: RE | Admit: 2020-10-22 | Payer: Medicare Other | Source: Ambulatory Visit

## 2020-10-29 ENCOUNTER — Ambulatory Visit: Payer: Medicare Other | Admitting: Physical Therapy

## 2020-11-06 DIAGNOSIS — R42 Dizziness and giddiness: Secondary | ICD-10-CM | POA: Diagnosis not present

## 2020-11-06 DIAGNOSIS — K219 Gastro-esophageal reflux disease without esophagitis: Secondary | ICD-10-CM | POA: Diagnosis not present

## 2020-11-06 DIAGNOSIS — R531 Weakness: Secondary | ICD-10-CM | POA: Diagnosis not present

## 2020-11-06 DIAGNOSIS — E782 Mixed hyperlipidemia: Secondary | ICD-10-CM | POA: Diagnosis not present

## 2020-11-06 DIAGNOSIS — K5909 Other constipation: Secondary | ICD-10-CM | POA: Diagnosis not present

## 2020-11-06 DIAGNOSIS — E1122 Type 2 diabetes mellitus with diabetic chronic kidney disease: Secondary | ICD-10-CM | POA: Diagnosis not present

## 2020-11-06 DIAGNOSIS — R110 Nausea: Secondary | ICD-10-CM | POA: Diagnosis not present

## 2020-11-06 DIAGNOSIS — Z992 Dependence on renal dialysis: Secondary | ICD-10-CM | POA: Diagnosis not present

## 2020-11-06 DIAGNOSIS — N186 End stage renal disease: Secondary | ICD-10-CM | POA: Diagnosis not present

## 2020-11-06 DIAGNOSIS — R269 Unspecified abnormalities of gait and mobility: Secondary | ICD-10-CM | POA: Diagnosis not present

## 2020-11-06 DIAGNOSIS — I10 Essential (primary) hypertension: Secondary | ICD-10-CM | POA: Diagnosis not present

## 2020-11-12 ENCOUNTER — Other Ambulatory Visit: Payer: Self-pay

## 2020-11-12 ENCOUNTER — Ambulatory Visit: Payer: Medicare Other | Attending: Physician Assistant

## 2020-11-12 VITALS — BP 135/69

## 2020-11-12 DIAGNOSIS — M6281 Muscle weakness (generalized): Secondary | ICD-10-CM | POA: Insufficient documentation

## 2020-11-12 DIAGNOSIS — R2681 Unsteadiness on feet: Secondary | ICD-10-CM | POA: Insufficient documentation

## 2020-11-12 DIAGNOSIS — M542 Cervicalgia: Secondary | ICD-10-CM | POA: Insufficient documentation

## 2020-11-12 DIAGNOSIS — R29898 Other symptoms and signs involving the musculoskeletal system: Secondary | ICD-10-CM | POA: Insufficient documentation

## 2020-11-12 DIAGNOSIS — Z9181 History of falling: Secondary | ICD-10-CM | POA: Diagnosis present

## 2020-11-12 NOTE — Addendum Note (Signed)
Addended by: Hall Busing on: 11/12/2020 01:43 PM   Modules accepted: Orders

## 2020-11-12 NOTE — Patient Instructions (Signed)
Access Code: [email or web address] URL: [email or web address] Date: 11/12/2020 Prepared by: Sherlynn Stalls  Exercises Sit to Stand with Counter Support - 1 x daily - 4 x weekly - 2 sets - 10 reps Standing Hip Extension with Counter Support - 1 x daily - 4 x weekly - 2 sets - 10 reps Standing Hip Abduction with Counter Support - 1 x daily - 4 x weekly - 2 sets - 10 reps Standing March with Counter Support - 1 x daily - 7 x weekly - 2 sets - 10 reps

## 2020-11-12 NOTE — Therapy (Addendum)
[place]. [place], [place], [address] Phone: [phone]   Fax:  [phone]  Physical Therapy Evaluation  Patient Details  Name: Rachael Burke MRN: [id number] Date of Birth: 65/11/1955 Referring Provider (PT): Trey Sailors, [place]   Encounter Date: 11/12/2020   PT End of Session - 11/12/20 1306     Visit Number 1    Date for PT Re-Evaluation 02/04/21    Authorization Type [place] MEDICAID [employer]    PT Start Time 1228    PT Stop Time 1305    PT Time Calculation (min) 37 min    Activity Tolerance Patient tolerated treatment well    Behavior During Therapy [facility] for tasks assessed/performed             Past Medical History:  Diagnosis Date   Anemia    Anxiety    Arthritis    Diabetes mellitus without complication ([place])    Heart murmur    echo 07/02/20: Mild MR/TR, mild-moderate AV sclerosis, bicuspid or functional bicuspid AV, no evidence of AS. Murmr felt due to AV.   History of blood transfusion    Hyperlipidemia    Hypertension    Pneumonia    PONV (postoperative nausea and vomiting)    Renal disorder     Past Surgical History:  Procedure Laterality Date   AV FISTULA PLACEMENT Left 05/20/2020   Procedure: INSERTION OF LEFT ARM ARTERIOVENOUS (AV) FISTULA;  Surgeon: Marty Heck, MD;  Location: [place];  Service: Vascular;  Laterality: Left;   [place] Left 08/05/2020   Procedure: SECOND STAGE BASILIC VEIN TRANSPOSITION;  Surgeon: Marty Heck, MD;  Location: [place];  Service: Vascular;  Laterality: Left;   CESAREAN SECTION     CHOLECYSTECTOMY     INSERTION OF DIALYSIS CATHETER N/A 05/20/2020   Procedure: INSERTION OF DIALYSIS CATHETER;  Surgeon: Marty Heck, MD;  Location: [place];  Service: Vascular;  Laterality: N/A;    Vitals:   11/12/20 1236  BP: 135/69      Subjective Assessment - 11/12/20 1233     Subjective PT for  kidney transplant. Difficulty walking,  often gets tired. Plan to return to transplant team in 6 months.    Patient is accompained by: Family member;Interpreter   daughter, interporetor   Pertinent History dialysis port 3d / wk MWF dialysis port in left upper arm. HTN, DM, arthritis    Patient Stated Goals gent stronger, improve balance, less tired    Currently in Pain? No/denies                [facility] PT Assessment - 11/12/20 1232       Assessment   Medical Diagnosis gait abnormality and weakness    Referring Provider (PT) Trey Sailors, PA    Next MD Visit Seeing [facility] transplant team/MDs In 6 months      Precautions   Precaution Comments Dialysis port left brachial      Balance Screen   Has the patient fallen in the past 6 months --   Last fall in November 2021 - dizzy/lightheadedness on standing.     Home Environment   Type of Home House    Additional Comments lives with daughter.      Prior Function   Vocation Retired      Charity fundraiser Status Within Functional Limits for tasks assessed      Strength   Overall Strength  Comments BLE grossly 4/5      Transfers   Five time sit to stand comments  12 seconds no hands      Ambulation/Gait   Gait velocity 0.6 m/sec = limited community ambulator      6 minute walk test results    Endurance additional comments 2MWT: 138 m (normative for age is 165.47m , RPE 1-2/10 O2 97-99%      Standardized Balance Assessment   Standardized Balance Assessment Berg Balance Test      Berg Balance Test   Sit to Stand Able to stand without using hands and stabilize independently    Standing Unsupported Able to stand safely 2 minutes    Sitting with Back Unsupported but Feet Supported on Floor or Stool Able to sit safely and securely 2 minutes    Stand to Sit Sits safely with minimal use of hands    Transfers Able to transfer safely, minor use of hands    Standing Unsupported with Eyes Closed Able to stand 10  seconds safely    Standing Unsupported with Feet Together Able to place feet together independently and stand for 1 minute with supervision    From Standing, Reach Forward with Outstretched Arm Can reach forward >12 cm safely (5")    From Standing Position, Pick up Object from Floor Able to pick up shoe safely and easily    From Standing Position, Turn to Look Behind Over each Shoulder Looks behind one side only/other side shows less weight shift    Turn 360 Degrees Able to turn 360 degrees safely one side only in 4 seconds or less    Standing Unsupported, Alternately Place Feet on Step/Stool Able to stand independently and complete 8 steps >20 seconds    Standing Unsupported, One Foot in Front Needs help to step but can hold 15 seconds    Standing on One Leg Tries to lift leg/unable to hold 3 seconds but remains standing independently    Total Score 45                        Objective measurements completed on examination: See above findings.               PT Education - 11/12/20 1330     Education Details Initial PT POC and HEP for gross strength, balance. [facility]: [facility]   Person(s) Educated Patient    Methods Explanation;Demonstration;Handout    Comprehension Verbalized understanding;Need further instruction              PT Short Term Goals - 11/12/20 1323       PT SHORT TERM GOAL #1   Title Independent with initial HEP    Time 2    Period Weeks    Status New    Target Date 11/26/20               PT Long Term Goals - 11/12/20 1323       PT LONG TERM GOAL #1   Title Independent with advanced HEP    Time 6    Period Weeks    Status New    Target Date 12/24/20      PT LONG TERM GOAL #2   Title Pt will achieve 5TSTS in less than 10 seconds with good control    Baseline 65 seconds on eval  11/12/20.    Time 8    Period Weeks    Status New  Target Date 01/07/21      PT LONG TERM GOAL #3   Title Pt will achieve gait speed  of at least 1.2 m/second to facilitate safe community ambulation without fatigue or LOB>    Time 12    Period Weeks    Status New    Target Date 02/04/21      PT LONG TERM GOAL #4   Title Pt will improve Berg score to at least 53/56 to demonstrate decreased falls risk    Baseline 11/12/20: 65/56 with most difficulty noted with SLS, tandem    Time 12    Period Weeks    Status New    Target Date 02/04/21      PT LONG TERM GOAL #5   Title Pt will achieve distance walked for at least 163.7 m on the 2MWT with good VSS to demonstrate improved aerobic capacity.    Baseline 138 m    Time 12    Period Weeks    Status New    Target Date 02/04/21      Additional Long Term Goals   Additional Long Term Goals Yes      PT LONG TERM GOAL #6   Title BLE strength to at least 4+/5    Time 8    Period Weeks    Status New    Target Date 01/07/21                    Plan - 11/12/20 1308     Clinical Impression Statement Pt is a 65 yo female who was referred for unsteady gait and mms weakness. Pt is currently on hemodialysis 3days/wk and is seeing Transplant Team as she needs a transplant but requires some improvement in strength/conditioning prior.  Pt reports she does get limited in walking and activities at home due to fatigue and dizziness/lightheadedness. Pt presents with decreased general strength, diminished balance especially with narrow base of support (SLS, tandem), and abnormal gait. 2MWT conducted today d/t time constraints and pt tolerated fairly well with low RPE reported but with some tiredness post, traveling distance of  138 m (normative for age is 163.12m . VSS throughout session. Pt will benefit from skilled physical therapy to work towards improving functional strength, balance and mobility.    Comorbidities DM, arthritis, anxiety, kidney disease/ hemodialysis    Stability/Clinical Decision Making Evolving/Moderate complexity    Clinical Decision Making Moderate    Rehab  Potential Good    PT Frequency 2x / week    PT Duration 12 weeks    PT Treatment/Interventions ADLs/Self Care Home Management;Cryotherapy;Electrical Stimulation;Moist Heat;Iontophoresis '4mg'$ /ml Dexamethasone;Gait training;Therapeutic activities;Therapeutic exercise;Balance training;Manual techniques;Patient/family education;Taping;Functional mobility training;Neuromuscular re-education    PT Next Visit Plan Reassess HEP. May complete a 6MWT to get better understanding of aerobic capacity. Initiate LE strength and endurance exercises, mobiltiy, and balance as tolerated.    Consulted and Agree with Plan of Care Patient;Family member/caregiver    Family Member Consulted Daughter             Patient will benefit from skilled therapeutic intervention in order to improve the following deficits and impairments:  Difficulty walking, Abnormal gait, Dizziness, Pain, Decreased balance, Decreased mobility, Decreased strength, Decreased activity tolerance  Visit Diagnosis: Unsteadiness on feet - Plan: PT plan of care cert/re-cert  Muscle weakness (generalized) - Plan: PT plan of care cert/re-cert  History of falling - Plan: PT plan of care cert/re-cert     Problem List Patient Active Problem List  Diagnosis Date Noted   End stage renal disease on dialysis ([place]) 07/23/2020   Generalized anxiety disorder 07/23/2020   Acute renal failure superimposed on chronic kidney disease ([place]) 12/29/2019   Anemia associated with chronic renal failure 12/29/2019   Symptomatic anemia 04/08/2019   Hyperlipidemia 04/08/2019   AKI (acute kidney injury) ([place]) 04/07/2019   Hypertension associated with diabetes ([place]) 04/06/2019   Type 2 diabetes mellitus with complication, without long-term current use of insulin ([place]) 01/04/2018    Hall Busing, PT, DPT 11/12/2020, 1:41 PM  [place]. [place], [place], [address] Phone: [phone]    Fax:  [phone]  Name: Dolce Loch MRN: [id number] Date of Birth: 09/04/1955

## 2020-11-14 ENCOUNTER — Other Ambulatory Visit: Payer: Self-pay

## 2020-11-14 ENCOUNTER — Ambulatory Visit: Payer: Medicare Other | Admitting: Physical Therapy

## 2020-11-14 ENCOUNTER — Encounter: Payer: Self-pay | Admitting: Physical Therapy

## 2020-11-14 DIAGNOSIS — M6281 Muscle weakness (generalized): Secondary | ICD-10-CM

## 2020-11-14 DIAGNOSIS — Z9181 History of falling: Secondary | ICD-10-CM

## 2020-11-14 DIAGNOSIS — R2681 Unsteadiness on feet: Secondary | ICD-10-CM

## 2020-11-14 NOTE — Therapy (Signed)
[place]. [place], [place], [address] Phone: [phone]   Fax:  [phone]  Physical Therapy Treatment  Patient Details  Name: Rachael Burke MRN: [id number] Date of Birth: 11/20/1955 Referring Provider (PT): Trey Sailors, [place]   Encounter Date: 11/14/2020   PT End of Session - 11/14/20 1143     Visit Number 2    Date for PT Re-Evaluation 02/04/21    Activity Tolerance Patient tolerated treatment well;Patient limited by fatigue    Behavior During Therapy [facility] for tasks assessed/performed             Past Medical History:  Diagnosis Date   Anemia    Anxiety    Arthritis    Diabetes mellitus without complication ([place])    Heart murmur    echo 07/02/20: Mild MR/TR, mild-moderate AV sclerosis, bicuspid or functional bicuspid AV, no evidence of AS. Murmr felt due to AV.   History of blood transfusion    Hyperlipidemia    Hypertension    Pneumonia    PONV (postoperative nausea and vomiting)    Renal disorder     Past Surgical History:  Procedure Laterality Date   AV FISTULA PLACEMENT Left 05/20/2020   Procedure: INSERTION OF LEFT ARM ARTERIOVENOUS (AV) FISTULA;  Surgeon: Marty Heck, MD;  Location: [place];  Service: Vascular;  Laterality: Left;   [place] Left 08/05/2020   Procedure: SECOND STAGE BASILIC VEIN TRANSPOSITION;  Surgeon: Marty Heck, MD;  Location: [place];  Service: Vascular;  Laterality: Left;   CESAREAN SECTION     CHOLECYSTECTOMY     INSERTION OF DIALYSIS CATHETER N/A 05/20/2020   Procedure: INSERTION OF DIALYSIS CATHETER;  Surgeon: Marty Heck, MD;  Location: [facility] OR;  Service: Vascular;  Laterality: N/A;    There were no vitals filed for this visit.   Subjective Assessment - 11/14/20 1054     Subjective Pt reports ex's are going well at home; states no new changes this rx.    Patient is accompained by: Family member;Interpreter     Currently in Pain? No/denies                               [facility] Adult PT Treatment/Exercise - 11/14/20 0001       Exercises   Exercises Knee/Hip      Knee/Hip Exercises: Aerobic   Nustep L5 x 6 min    Other Aerobic UBE L1.5 x3 min each way      Knee/Hip Exercises: Standing   Heel Raises Both;1 set;10 reps    Hip Flexion Both;1 set;10 reps    Hip Flexion Limitations 2.5#    Hip Abduction Both;2 sets;10 reps    Abduction Limitations 2.5#    Hip Extension Both;2 sets;10 reps    Extension Limitations 2.5#      Knee/Hip Exercises: Seated   Long Arc Quad Both;2 sets;10 reps   1-2 sec hold   Long Arc Quad Limitations 2.5#    Marching Both;2 sets;10 reps    Marching Limitations 2.5#    Hamstring Curl Both;2 sets;10 reps    Hamstring Limitations red tb    Sit to Sand 2 sets;10 reps;without UE support   with 2# chest press and OHP                     PT Short Term Goals - 11/12/20  [place] #1   Title Independent with initial HEP    Time 2    Period Weeks    Status New    Target Date 11/26/20               PT Long Term Goals - 11/12/20 1323       PT LONG TERM GOAL #1   Title Independent with advanced HEP    Time 6    Period Weeks    Status New    Target Date 12/24/20      PT LONG TERM GOAL #2   Title Pt will achieve 5TSTS in less than 10 seconds with good control    Baseline 12 seconds on eval  11/12/20.    Time 8    Period Weeks    Status New    Target Date 01/07/21      PT LONG TERM GOAL #3   Title Pt will achieve gait speed of at least 1.2 m/second to facilitate safe community ambulation without fatigue or LOB>    Time 12    Period Weeks    Status New    Target Date 02/04/21      PT LONG TERM GOAL #4   Title Pt will improve Berg score to at least 53/56 to demonstrate decreased falls risk    Baseline 11/12/20: 45/56 with most difficulty noted with SLS, tandem    Time 12    Period Weeks     Status New    Target Date 02/04/21      PT LONG TERM GOAL #5   Title Pt will achieve distance walked for at least 163.7 m on the 2MWT with good VSS to demonstrate improved aerobic capacity.    Baseline 138 m    Time 12    Period Weeks    Status New    Target Date 02/04/21      Additional Long Term Goals   Additional Long Term Goals Yes      PT LONG TERM GOAL #6   Title BLE strength to at least 4+/5    Time 8    Period Weeks    Status New    Target Date 01/07/21                   Plan - 11/14/20 1144     Clinical Impression Statement Pt tolerated progression to TE well. Required frequent rest breaks throughout session d/t increased fatigue. Only able to sustain aerobic activity on UBE/nustep for 3 min before requiring rest break. Occasional LOB walking around clinic esp when fatigued; close supervision with ambulation. Cues to avoid compensations with standing hip ex's. Continue to progress to tolerance.    PT Treatment/Interventions ADLs/Self Care Home Management;Cryotherapy;Electrical Stimulation;Moist Heat;Iontophoresis '4mg'$ /ml Dexamethasone;Gait training;Therapeutic activities;Therapeutic exercise;Balance training;Manual techniques;Patient/family education;Taping;Functional mobility training;Neuromuscular re-education    PT Next Visit Plan Reassess HEP. May complete a 6MWT to get better understanding of aerobic capacity. Initiate LE strength and endurance exercises, mobiltiy, and balance as tolerated.    Consulted and Agree with Plan of Care Patient             Patient will benefit from skilled therapeutic intervention in order to improve the following deficits and impairments:  Difficulty walking, Abnormal gait, Dizziness, Pain, Decreased balance, Decreased mobility, Decreased strength, Decreased activity tolerance  Visit Diagnosis: Unsteadiness on feet  Muscle weakness (generalized)  History of falling     Problem List  Patient Active Problem List    Diagnosis Date Noted   End stage renal disease on dialysis ([place]) 07/23/2020   Generalized anxiety disorder 07/23/2020   Acute renal failure superimposed on chronic kidney disease ([place]) 12/29/2019   Anemia associated with chronic renal failure 12/29/2019   Symptomatic anemia 04/08/2019   Hyperlipidemia 04/08/2019   AKI (acute kidney injury) ([place]) 04/07/2019   Hypertension associated with diabetes ([place]) 04/06/2019   Type 2 diabetes mellitus with complication, without long-term current use of insulin ([place]) 01/04/2018   Amador Cunas, PT, DPT Donald Prose Samiksha Pellicano 11/14/2020, 11:51 AM  [place]. [place], [place], [address] Phone: [phone]   Fax:  [phone]  Name: Rachael Burke MRN: [id number] Date of Birth: July 04, 1955

## 2020-11-19 ENCOUNTER — Encounter: Payer: Self-pay | Admitting: Physical Therapy

## 2020-11-19 ENCOUNTER — Other Ambulatory Visit: Payer: Self-pay

## 2020-11-19 ENCOUNTER — Ambulatory Visit: Payer: Medicare Other | Admitting: Physical Therapy

## 2020-11-19 DIAGNOSIS — R2681 Unsteadiness on feet: Secondary | ICD-10-CM

## 2020-11-19 DIAGNOSIS — Z9181 History of falling: Secondary | ICD-10-CM

## 2020-11-19 DIAGNOSIS — M6281 Muscle weakness (generalized): Secondary | ICD-10-CM

## 2020-11-19 NOTE — Therapy (Signed)
[place] [place], [place], [address] Phone: [phone]   Fax:  [phone]  Physical Therapy Treatment  Patient Details  Name: Anaaya Coulson MRN: [id number] Date of Birth: [birth year] Referring Provider (PT): Trey Sailors, [place]   Encounter Date: 11/19/2020   PT End of Session - 11/19/20 1341     Visit Number 3    Date for PT Re-Evaluation 02/04/21    PT Start Time 1257    PT Stop Time 1340    PT Time Calculation (min) 43 min    Activity Tolerance Patient tolerated treatment well;Patient limited by fatigue    Behavior During Therapy [facility] for tasks assessed/performed             Past Medical History:  Diagnosis Date   Anemia    Anxiety    Arthritis    Diabetes mellitus without complication (HCC)    Heart murmur    echo 07/02/20: Mild MR/TR, mild-moderate AV sclerosis, bicuspid or functional bicuspid AV, no evidence of AS. Murmr felt due to AV.   History of blood transfusion    Hyperlipidemia    Hypertension    Pneumonia    PONV (postoperative nausea and vomiting)    Renal disorder     Past Surgical History:  Procedure Laterality Date   AV FISTULA PLACEMENT Left 05/20/2020   Procedure: INSERTION OF LEFT ARM ARTERIOVENOUS (AV) FISTULA;  Surgeon: Marty Heck, MD;  Location: [place];  Service: Vascular;  Laterality: Left;   [place] Left 08/05/2020   Procedure: SECOND STAGE BASILIC VEIN TRANSPOSITION;  Surgeon: Marty Heck, MD;  Location: [place];  Service: Vascular;  Laterality: Left;   CESAREAN SECTION     CHOLECYSTECTOMY     INSERTION OF DIALYSIS CATHETER N/A 05/20/2020   Procedure: INSERTION OF DIALYSIS CATHETER;  Surgeon: Marty Heck, MD;  Location: [facility] OR;  Service: Vascular;  Laterality: N/A;    There were no vitals filed for this visit.   Subjective Assessment - 11/19/20 1300     Subjective Pt states a little tired after last session  but no increased pain. No new changes this rx.    Currently in Pain? No/denies    Pain Score 0-No pain                               [facility] Adult PT Treatment/Exercise - 11/19/20 0001       Knee/Hip Exercises: Aerobic   Nustep L5 x 6 min    Other Aerobic UBE L1.5 x3 min each way      Knee/Hip Exercises: Standing   Heel Raises Both;10 reps;2 sets    Heel Raises Limitations 2.5#    Hip Flexion Both;10 reps;2 sets    Hip Flexion Limitations 2.5#    Hip Abduction Both;2 sets;10 reps    Abduction Limitations 2.5#    Hip Extension Both;2 sets;10 reps    Extension Limitations 2.5#      Knee/Hip Exercises: Seated   Long Arc Quad Both;2 sets;10 reps    Long Arc Quad Limitations 2.5#    Marching Both;2 sets;10 reps    Marching Limitations 2.5#    Hamstring Curl Both;2 sets;10 reps    Hamstring Limitations green tb    Abduction/Adduction  Both;1 set;15 reps    Abd/Adduction Limitations green tb seated clams    Sit to Sand 2 sets;10 reps;without UE support  with 2# chest press and OHP                     PT Short Term Goals - 11/12/20 1323       PT SHORT TERM GOAL #1   Title Independent with initial HEP    Time 2    Period Weeks    Status New    Target Date 11/26/20               PT Long Term Goals - 11/12/20 1323       PT LONG TERM GOAL #1   Title Independent with advanced HEP    Time 6    Period Weeks    Status New    Target Date 12/24/20      PT LONG TERM GOAL #2   Title Pt will achieve 5TSTS in less than 10 seconds with good control    Baseline 12 seconds on eval  11/12/20.    Time 8    Period Weeks    Status New    Target Date 01/07/21      PT LONG TERM GOAL #3   Title Pt will achieve gait speed of at least 1.2 m/second to facilitate safe community ambulation without fatigue or LOB>    Time 12    Period Weeks    Status New    Target Date 02/04/21      PT LONG TERM GOAL #4   Title Pt will improve Berg score to at  least 53/56 to demonstrate decreased falls risk    Baseline 11/12/20: 45/56 with most difficulty noted with SLS, tandem    Time 12    Period Weeks    Status New    Target Date 02/04/21      PT LONG TERM GOAL #5   Title Pt will achieve distance walked for at least 163.7 m on the 2MWT with good VSS to demonstrate improved aerobic capacity.    Baseline 138 m    Time 12    Period Weeks    Status New    Target Date 02/04/21      Additional Long Term Goals   Additional Long Term Goals Yes      PT LONG TERM GOAL #6   Title BLE strength to at least 4+/5    Time 8    Period Weeks    Status New    Target Date 01/07/21                   Plan - 11/19/20 1342     Clinical Impression Statement Pt tolerated progression of TE well. Required fewer rest breaks throughout session. Able to complete 6 min on nustep with no break; 3 min on UBE. Cues to avoid compensations with standing hip ex's. [place] with gait this rx; close supervision, no LOB.    PT Treatment/Interventions ADLs/Self Care Home Management;Cryotherapy;Electrical Stimulation;Moist Heat;Iontophoresis '4mg'$ /ml Dexamethasone;Gait training;Therapeutic activities;Therapeutic exercise;Balance training;Manual techniques;Patient/family education;Taping;Functional mobility training;Neuromuscular re-education    PT Next Visit Plan Reassess HEP. May complete a 6MWT to get better understanding of aerobic capacity. Initiate LE strength and endurance exercises, mobiltiy, and balance as tolerated.    Consulted and Agree with Plan of Care Patient             Patient will benefit from skilled therapeutic intervention in order to improve the following deficits and impairments:  Difficulty walking, Abnormal gait, Dizziness, Pain, Decreased balance, Decreased mobility, Decreased strength, Decreased activity tolerance  Visit Diagnosis: Unsteadiness on feet  Muscle weakness (generalized)  History of falling     Problem List Patient  Active Problem List   Diagnosis Date Noted   End stage renal disease on dialysis ([place]) 07/23/2020   Generalized anxiety disorder 07/23/2020   Acute renal failure superimposed on chronic kidney disease ([place]) 12/29/2019   Anemia associated with chronic renal failure 12/29/2019   Symptomatic anemia 04/08/2019   Hyperlipidemia 04/08/2019   AKI (acute kidney injury) ([place]) 04/07/2019   Hypertension associated with diabetes ([place]) 04/06/2019   Type 2 diabetes mellitus with complication, without long-term current use of insulin ([place]) 01/04/2018   Amador Cunas, PT, DPT Donald Prose Saria Haran 11/19/2020, 1:43 PM  [place]. [place], [place], [address] Phone: [phone]   Fax:  [phone]  Name: Sylvesta Winding MRN: [id number] Date of Birth: 06/03/1955

## 2020-11-21 ENCOUNTER — Encounter: Payer: Self-pay | Admitting: Physical Therapy

## 2020-11-21 ENCOUNTER — Other Ambulatory Visit: Payer: Self-pay

## 2020-11-21 ENCOUNTER — Ambulatory Visit: Payer: Medicare Other | Admitting: Physical Therapy

## 2020-11-21 DIAGNOSIS — Z9181 History of falling: Secondary | ICD-10-CM

## 2020-11-21 DIAGNOSIS — R2681 Unsteadiness on feet: Secondary | ICD-10-CM

## 2020-11-21 DIAGNOSIS — M6281 Muscle weakness (generalized): Secondary | ICD-10-CM

## 2020-11-21 NOTE — Therapy (Signed)
[place]. [place], [place], [address] Phone: [phone]   Fax:  [phone]  Physical Therapy Treatment  Patient Details  Name: Rachael Burke MRN: [id number] Date of Birth: 19-Aug-1955 Referring Provider (PT): Trey Sailors, [place]   Encounter Date: 11/21/2020   PT End of Session - 11/21/20 1400     Visit Number 4    Date for PT Re-Evaluation 02/04/21    PT Start Time 1301    PT Stop Time 1344    PT Time Calculation (min) 43 min    Activity Tolerance Patient tolerated treatment well    Behavior During Therapy [facility] for tasks assessed/performed             Past Medical History:  Diagnosis Date   Anemia    Anxiety    Arthritis    Diabetes mellitus without complication ([place])    Heart murmur    echo 07/02/20: Mild MR/TR, mild-moderate AV sclerosis, bicuspid or functional bicuspid AV, no evidence of AS. Murmr felt due to AV.   History of blood transfusion    Hyperlipidemia    Hypertension    Pneumonia    PONV (postoperative nausea and vomiting)    Renal disorder     Past Surgical History:  Procedure Laterality Date   AV FISTULA PLACEMENT Left 05/20/2020   Procedure: INSERTION OF LEFT ARM ARTERIOVENOUS (AV) FISTULA;  Surgeon: Marty Heck, MD;  Location: [place];  Service: Vascular;  Laterality: Left;   [place] Left 08/05/2020   Procedure: SECOND STAGE BASILIC VEIN TRANSPOSITION;  Surgeon: Marty Heck, MD;  Location: [place];  Service: Vascular;  Laterality: Left;   CESAREAN SECTION     CHOLECYSTECTOMY     INSERTION OF DIALYSIS CATHETER N/A 05/20/2020   Procedure: INSERTION OF DIALYSIS CATHETER;  Surgeon: Marty Heck, MD;  Location: [facility] OR;  Service: Vascular;  Laterality: N/A;    There were no vitals filed for this visit.   Subjective Assessment - 11/21/20 1303     Subjective Pt states tired after last session but doing well overall. No new changes  this rx.    Currently in Pain? No/denies                [facility] PT Assessment - 11/21/20 0001       6 minute walk test results    Endurance additional comments 6MWT: 1200 ft; RPE: 3/10; O2 97%, HR 72bpm                           [facility] Adult PT Treatment/Exercise - 11/21/20 0001       Knee/Hip Exercises: Aerobic   Nustep L5 x 6 min    Other Aerobic UBE L2 x 3 min each      Knee/Hip Exercises: Standing   Heel Raises Both;10 reps;2 sets    Heel Raises Limitations 2.5#    Hip Flexion Both;10 reps;2 sets    Hip Flexion Limitations 2.5#    Hip Abduction Both;2 sets;10 reps    Abduction Limitations 2.5#    Hip Extension Both;2 sets;10 reps    Extension Limitations 2.5#      Knee/Hip Exercises: Seated   Sit to Sand 2 sets;10 reps;without UE support   with 2# chest press and OHP                     PT Short Term  Goals - 11/12/20 1323       PT SHORT TERM GOAL #1   Title Independent with initial HEP    Time 2    Period Weeks    Status New    Target Date 11/26/20               PT Long Term Goals - 11/12/20 1323       PT LONG TERM GOAL #1   Title Independent with advanced HEP    Time 6    Period Weeks    Status New    Target Date 12/24/20      PT LONG TERM GOAL #2   Title Pt will achieve 5TSTS in less than 10 seconds with good control    Baseline 12 seconds on eval  11/12/20.    Time 8    Period Weeks    Status New    Target Date 01/07/21      PT LONG TERM GOAL #3   Title Pt will achieve gait speed of at least 1.2 m/second to facilitate safe community ambulation without fatigue or LOB>    Time 12    Period Weeks    Status New    Target Date 02/04/21      PT LONG TERM GOAL #4   Title Pt will improve Berg score to at least 53/56 to demonstrate decreased falls risk    Baseline 11/12/20: 45/56 with most difficulty noted with SLS, tandem    Time 12    Period Weeks    Status New    Target Date 02/04/21      PT LONG TERM  GOAL #5   Title Pt will achieve distance walked for at least 163.7 m on the 2MWT with good VSS to demonstrate improved aerobic capacity.    Baseline 138 m    Time 12    Period Weeks    Status New    Target Date 02/04/21      Additional Long Term Goals   Additional Long Term Goals Yes      PT LONG TERM GOAL #6   Title BLE strength to at least 4+/5    Time 8    Period Weeks    Status New    Target Date 01/07/21                   Plan - 11/21/20 1401     Clinical Impression Statement Pt demos significant improvement in endurance from time of eval. 6MWT with RPE </= 3/10 and 1200 ft; this is greatly improved from 2MWT at first appt. Pt does endorse more SOB and difficulty with UBE/nustep than with walking. Did well with ex's this rx; able to tolerate most of session standing with limited rest breaks. Progress endurance ex's, incorporate high level balance, and functional LE strengthening.    PT Treatment/Interventions ADLs/Self Care Home Management;Cryotherapy;Electrical Stimulation;Moist Heat;Iontophoresis '4mg'$ /ml Dexamethasone;Gait training;Therapeutic activities;Therapeutic exercise;Balance training;Manual techniques;Patient/family education;Taping;Functional mobility training;Neuromuscular re-education    PT Next Visit Plan LE strength and endurance exercises, mobiltiy, and balance as tolerated.    Consulted and Agree with Plan of Care Patient             Patient will benefit from skilled therapeutic intervention in order to improve the following deficits and impairments:  Difficulty walking, Abnormal gait, Dizziness, Pain, Decreased balance, Decreased mobility, Decreased strength, Decreased activity tolerance  Visit Diagnosis: Unsteadiness on feet  Muscle weakness (generalized)  History of falling     Problem  List Patient Active Problem List   Diagnosis Date Noted   End stage renal disease on dialysis ([place]) 07/23/2020   Generalized anxiety disorder 07/23/2020    Acute renal failure superimposed on chronic kidney disease ([place]) 12/29/2019   Anemia associated with chronic renal failure 12/29/2019   Symptomatic anemia 04/08/2019   Hyperlipidemia 04/08/2019   AKI (acute kidney injury) ([place]) 04/07/2019   Hypertension associated with diabetes ([place]) 04/06/2019   Type 2 diabetes mellitus with complication, without long-term current use of insulin ([place]) 01/04/2018   Amador Cunas, PT, DPT Donald Prose Brysun Eschmann 11/21/2020, 2:09 PM  [place]. [place], [place], [address] Phone: [phone]   Fax:  [phone]  Name: Windy Cos MRN: [id number] Date of Birth: 1955-12-01

## 2020-11-26 ENCOUNTER — Ambulatory Visit: Payer: Medicare Other | Admitting: Physical Therapy

## 2020-11-26 ENCOUNTER — Other Ambulatory Visit: Payer: Self-pay

## 2020-11-26 ENCOUNTER — Encounter: Payer: Self-pay | Admitting: Physical Therapy

## 2020-11-26 DIAGNOSIS — R2681 Unsteadiness on feet: Secondary | ICD-10-CM | POA: Diagnosis not present

## 2020-11-26 DIAGNOSIS — M542 Cervicalgia: Secondary | ICD-10-CM

## 2020-11-26 DIAGNOSIS — Z9181 History of falling: Secondary | ICD-10-CM

## 2020-11-26 DIAGNOSIS — R29898 Other symptoms and signs involving the musculoskeletal system: Secondary | ICD-10-CM

## 2020-11-26 DIAGNOSIS — M6281 Muscle weakness (generalized): Secondary | ICD-10-CM

## 2020-11-26 NOTE — Therapy (Signed)
[place]. [place], [place], [address] Phone: [phone]   Fax:  [phone]  Physical Therapy Treatment  Patient Details  Name: Rachael Burke MRN: [id number] Date of Birth: 03/28/1956 Referring Provider (PT): Trey Sailors, [place]   Encounter Date: 11/26/2020   PT End of Session - 11/26/20 1410     Visit Number 5    Number of Visits 13    Date for PT Re-Evaluation 02/04/21    Authorization Type [place] MEDICAID [employer]    PT Start Time 1317    PT Stop Time 1357    PT Time Calculation (min) 40 min    Activity Tolerance Patient tolerated treatment well    Behavior During Therapy [facility] for tasks assessed/performed             Past Medical History:  Diagnosis Date   Anemia    Anxiety    Arthritis    Diabetes mellitus without complication ([place])    Heart murmur    echo 07/02/20: Mild MR/TR, mild-moderate AV sclerosis, bicuspid or functional bicuspid AV, no evidence of AS. Murmr felt due to AV.   History of blood transfusion    Hyperlipidemia    Hypertension    Pneumonia    PONV (postoperative nausea and vomiting)    Renal disorder     Past Surgical History:  Procedure Laterality Date   AV FISTULA PLACEMENT Left 05/20/2020   Procedure: INSERTION OF LEFT ARM ARTERIOVENOUS (AV) FISTULA;  Surgeon: Marty Heck, MD;  Location: [place];  Service: Vascular;  Laterality: Left;   [place] Left 08/05/2020   Procedure: SECOND STAGE BASILIC VEIN TRANSPOSITION;  Surgeon: Marty Heck, MD;  Location: [place];  Service: Vascular;  Laterality: Left;   CESAREAN SECTION     CHOLECYSTECTOMY     INSERTION OF DIALYSIS CATHETER N/A 05/20/2020   Procedure: INSERTION OF DIALYSIS CATHETER;  Surgeon: Marty Heck, MD;  Location: [facility] OR;  Service: Vascular;  Laterality: N/A;    There were no vitals filed for this visit.   Subjective Assessment - 11/26/20 1320      Subjective Pt is doing well.    Pain Score 5     Pain Location Back                               [facility] Adult PT Treatment/Exercise - 11/26/20 0001       Ambulation/Gait   Gait Comments forward and lateral step ups 2x10 SBA   LOB x2 fwd step ups with L leg     High Level Balance   High Level Balance Comments B SLS, narrow stance, tandem stance on airex 2x30, stepping fwd,bwd,lateral over 2 1/2 foam rollers, tandem walking   CGA with min LOB     Knee/Hip Exercises: Aerobic   Other Aerobic UBE L2 x 3 min each      Knee/Hip Exercises: Standing   Other Standing Knee Exercises heel and toe raises 2x10 while balancing      Knee/Hip Exercises: Seated   Sit to Sand 2 sets;10 reps   yellow ball OHP, chest press                   PT Education - 11/26/20 1356     Education Details High level balance activities at [employer]) Educated Patient    Methods Explanation;Demonstration;Handout  Comprehension Verbalized understanding;Returned demonstration              PT Short Term Goals - 11/26/20 1410       PT SHORT TERM GOAL #1   Title Independent with initial HEP    Time 2    Status On-going    Target Date 11/26/20               PT Long Term Goals - 11/12/20 1323       PT LONG TERM GOAL #1   Title Independent with advanced HEP    Time 6    Period Weeks    Status New    Target Date 12/24/20      PT LONG TERM GOAL #2   Title Pt will achieve 5TSTS in less than 10 seconds with good control    Baseline 12 seconds on eval  11/12/20.    Time 8    Period Weeks    Status New    Target Date 01/07/21      PT LONG TERM GOAL #3   Title Pt will achieve gait speed of at least 1.2 m/second to facilitate safe community ambulation without fatigue or LOB>    Time 12    Period Weeks    Status New    Target Date 02/04/21      PT LONG TERM GOAL #4   Title Pt will improve Berg score to at least 53/56 to demonstrate decreased  falls risk    Baseline 11/12/20: 45/56 with most difficulty noted with SLS, tandem    Time 12    Period Weeks    Status New    Target Date 02/04/21      PT LONG TERM GOAL #5   Title Pt will achieve distance walked for at least 163.7 m on the 2MWT with good VSS to demonstrate improved aerobic capacity.    Baseline 138 m    Time 12    Period Weeks    Status New    Target Date 02/04/21      Additional Long Term Goals   Additional Long Term Goals Yes      PT LONG TERM GOAL #6   Title BLE strength to at least 4+/5    Time 8    Period Weeks    Status New    Target Date 01/07/21                   Plan - 11/26/20 1403     Clinical Impression Statement Pt completed high level balance activites completed pt required CGA with min LOB during activites. Pt tended to lose balance when lifting L leg during step ups and compensated by leaning to the R. She recieved balance activitiy handout for at home exercises. Pt tolerated all exericses well and felt good following treatment. Educated pt on exercising and moving outside of treatment.    PT Treatment/Interventions ADLs/Self Care Home Management;Cryotherapy;Electrical Stimulation;Moist Heat;Iontophoresis '4mg'$ /ml Dexamethasone;Gait training;Therapeutic activities;Therapeutic exercise;Balance training;Manual techniques;Patient/family education;Taping;Functional mobility training;Neuromuscular re-education    PT Next Visit Plan LE strength and endurance exercises, mobiltiy, and balance as tolerated.    PT Home Exercise Plan [id number]             Patient will benefit from skilled therapeutic intervention in order to improve the following deficits and impairments:  Difficulty walking, Abnormal gait, Dizziness, Pain, Decreased balance, Decreased mobility, Decreased strength, Decreased activity tolerance  Visit Diagnosis: Unsteadiness on feet  Muscle weakness (generalized)  History  of falling  Cervicalgia  Decreased ROM of  neck     Problem List Patient Active Problem List   Diagnosis Date Noted   End stage renal disease on dialysis ([place]) 07/23/2020   Generalized anxiety disorder 07/23/2020   Acute renal failure superimposed on chronic kidney disease ([place]) 12/29/2019   Anemia associated with chronic renal failure 12/29/2019   Symptomatic anemia 04/08/2019   Hyperlipidemia 04/08/2019   AKI (acute kidney injury) ([place]) 04/07/2019   Hypertension associated with diabetes ([place]) 04/06/2019   Type 2 diabetes mellitus with complication, without long-term current use of insulin ([place]) 01/04/2018    Dawayne Cirri, SPTA 11/26/2020, 2:14 PM  [place]. [place], [place], [address] Phone: [phone]   Fax:  [phone]  Name: Rachael Burke MRN: [id number] Date of Birth: 1955/09/13

## 2020-11-28 ENCOUNTER — Other Ambulatory Visit: Payer: Self-pay

## 2020-11-28 ENCOUNTER — Ambulatory Visit: Payer: Medicare Other

## 2020-11-28 DIAGNOSIS — R2681 Unsteadiness on feet: Secondary | ICD-10-CM

## 2020-11-28 DIAGNOSIS — Z9181 History of falling: Secondary | ICD-10-CM

## 2020-11-28 DIAGNOSIS — R29898 Other symptoms and signs involving the musculoskeletal system: Secondary | ICD-10-CM

## 2020-11-28 DIAGNOSIS — M6281 Muscle weakness (generalized): Secondary | ICD-10-CM

## 2020-11-28 DIAGNOSIS — M542 Cervicalgia: Secondary | ICD-10-CM

## 2020-11-28 NOTE — Therapy (Signed)
[place]. [place], [place], [address] Phone: [phone]   Fax:  [phone]  Physical Therapy Treatment  Patient Details  Name: Rachael Burke MRN: [id number] Date of Birth: 12-14-1955 Referring Provider (PT): Trey Sailors, [place]   Encounter Date: 11/28/2020   PT End of Session - 11/28/20 1358     Visit Number 6    Number of Visits 13    Date for PT Re-Evaluation 02/04/21    Authorization Type [place] MEDICAID [employer]    PT Start Time 1317    PT Stop Time 1358    PT Time Calculation (min) 41 min    Equipment Utilized During Treatment Gait belt    Activity Tolerance Patient tolerated treatment well    Behavior During Therapy [facility] for tasks assessed/performed             Past Medical History:  Diagnosis Date   Anemia    Anxiety    Arthritis    Diabetes mellitus without complication ([place])    Heart murmur    echo 07/02/20: Mild MR/TR, mild-moderate AV sclerosis, bicuspid or functional bicuspid AV, no evidence of AS. Murmr felt due to AV.   History of blood transfusion    Hyperlipidemia    Hypertension    Pneumonia    PONV (postoperative nausea and vomiting)    Renal disorder     Past Surgical History:  Procedure Laterality Date   AV FISTULA PLACEMENT Left 05/20/2020   Procedure: INSERTION OF LEFT ARM ARTERIOVENOUS (AV) FISTULA;  Surgeon: Marty Heck, MD;  Location: [facility];  Service: Vascular;  Laterality: Left;   [place] Left 08/05/2020   Procedure: SECOND STAGE BASILIC VEIN TRANSPOSITION;  Surgeon: Marty Heck, MD;  Location: [facility];  Service: Vascular;  Laterality: Left;   CESAREAN SECTION     CHOLECYSTECTOMY     INSERTION OF DIALYSIS CATHETER N/A 05/20/2020   Procedure: INSERTION OF DIALYSIS CATHETER;  Surgeon: Marty Heck, MD;  Location[facility];  Service: Vascular;  Laterality: N/A;    There were no vitals filed for this  visit.   Subjective Assessment - 11/28/20 1359     Subjective Pt is doing well and has been practicing her new exercises at home.    Patient is accompained by: Family member;Interpreter   daughter in waiting room; Interpreter, Johnsie Cancel   Currently in Pain? No/denies                               [facility] Adult PT Treatment/Exercise - 11/28/20 0001       Ambulation/Gait   Gait Comments --   LOB x2 fwd step ups with L leg     High Level Balance   High Level Balance Comments forward and lateral step ups on 6" step 2x10 SBA   cues to use HR as needed, even light touch, to maintain balance     Neuro Re-ed    Neuro Re-ed Details  In  bars: side stepping with red band around ankles, tandem walking and side stepping on foam beam, reverse tandem on flat surface      Knee/Hip Exercises: Aerobic   Nustep L5 x 6 min UE/LE      Knee/Hip Exercises: Seated   Sit to Sand 2 sets;10 reps   yellow ball chest press with verbal cues to keep shoulders down and relaxed which reduced L  shoulder pain                     PT Short Term Goals - 11/26/20 1410       PT SHORT TERM GOAL #1   Title Independent with initial HEP    Time 2    Status On-going    Target Date 11/26/20               PT Long Term Goals - 11/12/20 1323       PT LONG TERM GOAL #1   Title Independent with advanced HEP    Time 6    Period Weeks    Status New    Target Date 12/24/20      PT LONG TERM GOAL #2   Title Pt will achieve 5TSTS in less than 10 seconds with good control    Baseline 12 seconds on eval  11/12/20.    Time 8    Period Weeks    Status New    Target Date 01/07/21      PT LONG TERM GOAL #3   Title Pt will achieve gait speed of at least 1.2 m/second to facilitate safe community ambulation without fatigue or LOB>    Time 12    Period Weeks    Status New    Target Date 02/04/21      PT LONG TERM GOAL #4   Title Pt will improve Berg score to at least 53/56 to  demonstrate decreased falls risk    Baseline 11/12/20: 45/56 with most difficulty noted with SLS, tandem    Time 12    Period Weeks    Status New    Target Date 02/04/21      PT LONG TERM GOAL #5   Title Pt will achieve distance walked for at least 163.7 m on the 2MWT with good VSS to demonstrate improved aerobic capacity.    Baseline 138 m    Time 12    Period Weeks    Status New    Target Date 02/04/21      Additional Long Term Goals   Additional Long Term Goals Yes      PT LONG TERM GOAL #6   Title BLE strength to at least 4+/5    Time 8    Period Weeks    Status New    Target Date 01/07/21                   Plan - 11/28/20 1359     Clinical Impression Statement Pt participated in higher level balance activities in  bars with PT providing SBA to CGA for all. Pt demonstrates near LOB to R x2 with left lateral step ups - educated to use HR lightly as needed via interpreter, Johnsie Cancel. Pt did not follow instructions to use UE as need throughout sessions with moderate cueing provided, and had near LOB backward in  bars with reverse tandem. She does follow cues to form, but needs frequent reminders to maintain. She was diligent with HEP prescribed Tuesday, already practicing at home and is motivated to get better.    PT Treatment/Interventions ADLs/Self Care Home Management;Cryotherapy;Electrical Stimulation;Moist Heat;Iontophoresis '4mg'$ /ml Dexamethasone;Gait training;Therapeutic activities;Therapeutic exercise;Balance training;Manual techniques;Patient/family education;Taping;Functional mobility training;Neuromuscular re-education    PT Next Visit Plan LE strength and endurance exercises, mobility, and balance as tolerated.    PT Home Exercise Plan [id number]    Consulted and Agree with Plan of Care Patient  Patient will benefit from skilled therapeutic intervention in order to improve the following deficits and impairments:  Difficulty walking, Abnormal  gait, Dizziness, Pain, Decreased balance, Decreased mobility, Decreased strength, Decreased activity tolerance  Visit Diagnosis: Unsteadiness on feet  Muscle weakness (generalized)  History of falling  Cervicalgia  Decreased ROM of neck     Problem List Patient Active Problem List   Diagnosis Date Noted   End stage renal disease on dialysis ([place]) 07/23/2020   Generalized anxiety disorder 07/23/2020   Acute renal failure superimposed on chronic kidney disease ([place]) 12/29/2019   Anemia associated with chronic renal failure 12/29/2019   Symptomatic anemia 04/08/2019   Hyperlipidemia 04/08/2019   AKI (acute kidney injury) ([place]) 04/07/2019   Hypertension associated with diabetes ([place]) 04/06/2019   Type 2 diabetes mellitus with complication, without long-term current use of insulin ([place]) 01/04/2018    Izell [place]ville, PT, DPT 11/28/2020, 3:00 PM  [place]. [place], [place], [address] Phone: [phone]   Fax:  [phone]  Name: Rachael Burke MRN: [id number] Date of Birth: May 15, 1956

## 2020-12-03 ENCOUNTER — Other Ambulatory Visit: Payer: Self-pay

## 2020-12-03 ENCOUNTER — Ambulatory Visit: Payer: Medicare Other | Attending: Physician Assistant

## 2020-12-03 DIAGNOSIS — M542 Cervicalgia: Secondary | ICD-10-CM | POA: Diagnosis present

## 2020-12-03 DIAGNOSIS — R29898 Other symptoms and signs involving the musculoskeletal system: Secondary | ICD-10-CM | POA: Insufficient documentation

## 2020-12-03 DIAGNOSIS — R2681 Unsteadiness on feet: Secondary | ICD-10-CM | POA: Diagnosis not present

## 2020-12-03 DIAGNOSIS — M6281 Muscle weakness (generalized): Secondary | ICD-10-CM | POA: Insufficient documentation

## 2020-12-03 DIAGNOSIS — Z9181 History of falling: Secondary | ICD-10-CM | POA: Diagnosis present

## 2020-12-03 NOTE — Therapy (Signed)
[place]. [place], [place], [address] Phone: [phone]   Fax:  [phone]  Physical Therapy Treatment  Patient Details  Name: Rachael Burke MRN: [id number] Date of Birth: [birth year] Referring Provider (PT): Trey Sailors, [place]   Encounter Date: 12/03/2020   PT End of Session - 12/03/20 1140     Visit Number 7    Date for PT Re-Evaluation 02/04/21    Authorization Type [place] MEDICAID UNITEDHEALTHCARE COMMUNITY    PT Start Time 1132    PT Stop Time 1215    PT Time Calculation (min) 43 min    Equipment Utilized During Treatment --    Activity Tolerance Patient tolerated treatment well    Behavior During Therapy [facility] for tasks assessed/performed             Past Medical History:  Diagnosis Date   Anemia    Anxiety    Arthritis    Diabetes mellitus without complication ([place])    Heart murmur    echo 07/02/20: Mild MR/TR, mild-moderate AV sclerosis, bicuspid or functional bicuspid AV, no evidence of AS. Murmr felt due to AV.   History of blood transfusion    Hyperlipidemia    Hypertension    Pneumonia    PONV (postoperative nausea and vomiting)    Renal disorder     Past Surgical History:  Procedure Laterality Date   AV FISTULA PLACEMENT Left 05/20/2020   Procedure: INSERTION OF LEFT ARM ARTERIOVENOUS (AV) FISTULA;  Surgeon: Marty Heck, MD;  Location: [employer];  Service: Vascular;  Laterality: Left;   [place] Left 08/05/2020   Procedure: SECOND STAGE BASILIC VEIN TRANSPOSITION;  Surgeon: Marty Heck, MD;  Location: [facility];  Service: Vascular;  Laterality: Left;   CESAREAN SECTION     CHOLECYSTECTOMY     INSERTION OF DIALYSIS CATHETER N/A 05/20/2020   Procedure: INSERTION OF DIALYSIS CATHETER;  Surgeon: Marty Heck, MD;  Location: [facility] OR;  Service: Vascular;  Laterality: N/A;    There were no vitals filed for this visit.   Subjective Assessment -  12/03/20 1139     Subjective Questions about home exercises and continued PT    Patient is accompained by: Family member;Interpreter   Interpreter: [place]   Currently in Pain? No/denies                               [facility] Adult PT Treatment/Exercise - 12/03/20 0001       Ambulation/Gait   Gait Comments --   LOB x2 fwd step ups with L leg     High Level Balance   High Level Balance Activities --   marching with opp/alt knee taps x 4 laps. bwd walk x 4 laps. side stepping over foam rolls x 5 laps back/forth.  tandem high fives on airex x 10 each. semitandem on airex ball tosses x 10 each.   High Level Balance Comments forward and lateral step ups on 6" step 2x10 SBA   cues to use HR as needed, even light touch, to maintain balance     Neuro Re-ed    Neuro Re-ed Details  In  bars:  tandem walking and side stepping on foam beam, reverse tandem on flat surface      Knee/Hip Exercises: Aerobic   Nustep L5 x 6 min UE/LE   last 3 minutes LE only L3  Knee/Hip Exercises: Standing   Hip Abduction Both;2 sets;10 reps    Abduction Limitations 3#      Knee/Hip Exercises: Seated   Sit to Sand 2 sets;10 reps   yellow ball chest press with verbal cues to keep shoulders down and relaxed which reduced L shoulder pain                     PT Short Term Goals - 11/26/20 1410       PT SHORT TERM GOAL #1   Title Independent with initial HEP    Time 2    Status On-going    Target Date 11/26/20               PT Long Term Goals - 11/12/20 1323       PT LONG TERM GOAL #1   Title Independent with advanced HEP    Time 6    Period Weeks    Status New    Target Date 12/24/20      PT LONG TERM GOAL #2   Title Pt will achieve 5TSTS in less than 10 seconds with good control    Baseline 12 seconds on eval  11/12/20.    Time 8    Period Weeks    Status New    Target Date 01/07/21      PT LONG TERM GOAL #3   Title Pt will achieve gait speed of at least  1.2 m/second to facilitate safe community ambulation without fatigue or LOB>    Time 12    Period Weeks    Status New    Target Date 02/04/21      PT LONG TERM GOAL #4   Title Pt will improve Berg score to at least 53/56 to demonstrate decreased falls risk    Baseline 11/12/20: 45/56 with most difficulty noted with SLS, tandem    Time 12    Period Weeks    Status New    Target Date 02/04/21      PT LONG TERM GOAL #5   Title Pt will achieve distance walked for at least 163.7 m on the 2MWT with good VSS to demonstrate improved aerobic capacity.    Baseline 138 m    Time 12    Period Weeks    Status New    Target Date 02/04/21      Additional Long Term Goals   Additional Long Term Goals Yes      PT LONG TERM GOAL #6   Title BLE strength to at least 4+/5    Time 8    Period Weeks    Status New    Target Date 01/07/21                   Plan - 12/03/20 1216     Clinical Impression Statement Pt continued participation with higher level balance activities today. She does demo decreased SLS stability (left more difficult than right) , continues to require CGA to SBA. Added some balance activities to focus on this in addiiton to lateral hip stabilizatipon and strength, decreased ecccentric control and cues required to prevent compensations with hip ABD.  Tandem continues to be difficult. plan to continue to progress strength and higher level balance as tolerated.    PT Treatment/Interventions ADLs/Self Care Home Management;Cryotherapy;Electrical Stimulation;Moist Heat;Iontophoresis '4mg'$ /ml Dexamethasone;Gait training;Therapeutic activities;Therapeutic exercise;Balance training;Manual techniques;Patient/family education;Taping;Functional mobility training;Neuromuscular re-education    PT Next Visit Plan LE strength and endurance exercises, mobility, and balance  as tolerated. resisted gait    PT Home Exercise Plan [id number]    Consulted and Agree with Plan of Care Patient    Family  Member Consulted Daughter             Patient will benefit from skilled therapeutic intervention in order to improve the following deficits and impairments:  Difficulty walking, Abnormal gait, Dizziness, Pain, Decreased balance, Decreased mobility, Decreased strength, Decreased activity tolerance  Visit Diagnosis: Unsteadiness on feet  Muscle weakness (generalized)  History of falling     Problem List Patient Active Problem List   Diagnosis Date Noted   End stage renal disease on dialysis ([place]) 07/23/2020   Generalized anxiety disorder 07/23/2020   Acute renal failure superimposed on chronic kidney disease ([place]) 12/29/2019   Anemia associated with chronic renal failure 12/29/2019   Symptomatic anemia 04/08/2019   Hyperlipidemia 04/08/2019   AKI (acute kidney injury) ([place]) 04/07/2019   Hypertension associated with diabetes ([place]) 04/06/2019   Type 2 diabetes mellitus with complication, without long-term current use of insulin ([place]) 01/04/2018    Hall Busing, PT, DPT 12/03/2020, 12:21 PM  [place]. [place], [place], [address] Phone: [phone]   Fax:  [phone]  Name: Rachael Burke MRN: [id number] Date of Birth: [birth year]

## 2020-12-05 ENCOUNTER — Ambulatory Visit: Payer: Medicare Other

## 2020-12-05 ENCOUNTER — Other Ambulatory Visit: Payer: Self-pay

## 2020-12-05 DIAGNOSIS — M542 Cervicalgia: Secondary | ICD-10-CM

## 2020-12-05 DIAGNOSIS — R29898 Other symptoms and signs involving the musculoskeletal system: Secondary | ICD-10-CM

## 2020-12-05 DIAGNOSIS — Z9181 History of falling: Secondary | ICD-10-CM

## 2020-12-05 DIAGNOSIS — M6281 Muscle weakness (generalized): Secondary | ICD-10-CM

## 2020-12-05 DIAGNOSIS — R2681 Unsteadiness on feet: Secondary | ICD-10-CM | POA: Diagnosis not present

## 2020-12-05 NOTE — Therapy (Signed)
[place]. [place], [place], [address] Phone: [phone]   Fax:  [phone]  Physical Therapy Treatment  Patient Details  Name: Rachael Burke MRN: [id number] Date of Birth: Mar 24, 1956 Referring Provider (PT): Trey Sailors, [place]   Encounter Date: 12/05/2020   PT End of Session - 12/05/20 1420     Visit Number 8    Date for PT Re-Evaluation 02/04/21    Authorization Type [place] MEDICAID UNITEDHEALTHCARE COMMUNITY    PT Start Time 1345    PT Stop Time 1424    PT Time Calculation (min) 39 min    Activity Tolerance Patient tolerated treatment well    Behavior During Therapy [employer] for tasks assessed/performed             Past Medical History:  Diagnosis Date   Anemia    Anxiety    Arthritis    Diabetes mellitus without complication ([place])    Heart murmur    echo 07/02/20: Mild MR/TR, mild-moderate AV sclerosis, bicuspid or functional bicuspid AV, no evidence of AS. Murmr felt due to AV.   History of blood transfusion    Hyperlipidemia    Hypertension    Pneumonia    PONV (postoperative nausea and vomiting)    Renal disorder     Past Surgical History:  Procedure Laterality Date   AV FISTULA PLACEMENT Left 05/20/2020   Procedure: INSERTION OF LEFT ARM ARTERIOVENOUS (AV) FISTULA;  Surgeon: Marty Heck, MD;  Location: [facility];  Service: Vascular;  Laterality: Left;   [place] Left 08/05/2020   Procedure: SECOND STAGE BASILIC VEIN TRANSPOSITION;  Surgeon: Marty Heck, MD;  Location: [facility];  Service: Vascular;  Laterality: Left;   CESAREAN SECTION     CHOLECYSTECTOMY     INSERTION OF DIALYSIS CATHETER N/A 05/20/2020   Procedure: INSERTION OF DIALYSIS CATHETER;  Surgeon: Marty Heck, MD;  Location: [facility] OR;  Service: Vascular;  Laterality: N/A;    There were no vitals filed for this visit.   Subjective Assessment - 12/05/20 1419     Subjective No new  complaints.    Patient is accompained by: Family member   daughter   Currently in Pain? No/denies                               [facility] Adult PT Treatment/Exercise - 12/05/20 0001       High Level Balance   High Level Balance Comments 6" step + airex on top 5x2 each leg forwad step ups. x10 lateral up and overs B in  bars. Step up + knee up to firm blue airex in  bars.      Neuro Re-ed    Neuro Re-ed Details  tandem and reverse tandem in  bars.      Knee/Hip Exercises: Aerobic   Nustep L5 x 6 min UE/LE      Knee/Hip Exercises: Standing   Heel Raises Both;10 reps;2 sets    Heel Raises Limitations 3#    Knee Flexion Both;2 sets;10 reps    Knee Flexion Limitations 3#    Hip Flexion Both;10 reps;2 sets    Hip Flexion Limitations 3#    Hip Abduction Both;2 sets;10 reps    Abduction Limitations 3#    Hip Extension Both;2 sets;10 reps    Extension Limitations 3#    Walking with Sports Cord 30# x 2 laps  forward      Knee/Hip Exercises: Seated   Sit to Sand 2 sets;10 reps   airex, yellow ball chest press with verbal cues to keep shoulders down and relaxed which reduced L shoulder pain                     PT Short Term Goals - 11/26/20 1410       PT SHORT TERM GOAL #1   Title Independent with initial HEP    Time 2    Status On-going    Target Date 11/26/20               PT Long Term Goals - 11/12/20 1323       PT LONG TERM GOAL #1   Title Independent with advanced HEP    Time 6    Period Weeks    Status New    Target Date 12/24/20      PT LONG TERM GOAL #2   Title Pt will achieve 5TSTS in less than 10 seconds with good control    Baseline 12 seconds on eval  11/12/20.    Time 8    Period Weeks    Status New    Target Date 01/07/21      PT LONG TERM GOAL #3   Title Pt will achieve gait speed of at least 1.2 m/second to facilitate safe community ambulation without fatigue or LOB>    Time 12    Period Weeks    Status New     Target Date 02/04/21      PT LONG TERM GOAL #4   Title Pt will improve Berg score to at least 53/56 to demonstrate decreased falls risk    Baseline 11/12/20: 45/56 with most difficulty noted with SLS, tandem    Time 12    Period Weeks    Status New    Target Date 02/04/21      PT LONG TERM GOAL #5   Title Pt will achieve distance walked for at least 163.7 m on the 2MWT with good VSS to demonstrate improved aerobic capacity.    Baseline 138 m    Time 12    Period Weeks    Status New    Target Date 02/04/21      Additional Long Term Goals   Additional Long Term Goals Yes      PT LONG TERM GOAL #6   Title BLE strength to at least 4+/5    Time 8    Period Weeks    Status New    Target Date 01/07/21                   Plan - 12/05/20 1421     Clinical Impression Statement Tolerated all interventions well. CGA to SBE for balance exercises with SLS stability challenges. Step ups with airex and 6" step today in  bars and pt with more difficulty with LLE > RLE. Also introduced resisted gait today which was difficult especially controlling eccentric required CGA/minA for balance, wuld like to continue to progress this in further sessions for balance training.  Will benefit from further proximal hip strength to help facilitate balance skills    PT Treatment/Interventions ADLs/Self Care Home Management;Cryotherapy;Electrical Stimulation;Moist Heat;Iontophoresis '4mg'$ /ml Dexamethasone;Gait training;Therapeutic activities;Therapeutic exercise;Balance training;Manual techniques;Patient/family education;Taping;Functional mobility training;Neuromuscular re-education    PT Next Visit Plan LE strength and endurance exercises, mobility, and balance as tolerated. resisted gait    PT Home Exercise Plan [phone]  Consulted and Agree with Plan of Care Patient    Family Member Consulted Daughter             Patient will benefit from skilled therapeutic intervention in order to improve  the following deficits and impairments:  Difficulty walking, Abnormal gait, Dizziness, Pain, Decreased balance, Decreased mobility, Decreased strength, Decreased activity tolerance  Visit Diagnosis: Unsteadiness on feet  Muscle weakness (generalized)  History of falling  Cervicalgia  Decreased ROM of neck     Problem List Patient Active Problem List   Diagnosis Date Noted   End stage renal disease on dialysis ([place]) 07/23/2020   Generalized anxiety disorder 07/23/2020   Acute renal failure superimposed on chronic kidney disease ([place]) 12/29/2019   Anemia associated with chronic renal failure 12/29/2019   Symptomatic anemia 04/08/2019   Hyperlipidemia 04/08/2019   AKI (acute kidney injury) ([place]) 04/07/2019   Hypertension associated with diabetes ([place]) 04/06/2019   Type 2 diabetes mellitus with complication, without long-term current use of insulin ([place]) 01/04/2018    Hall Busing, PT, DPT 12/05/2020, 2:29 PM  [place]. [place], [place], [address] Phone: [phone]   Fax:  [phone]  Name: Clea Caine MRN: [id number] Date of Birth: [birth year]

## 2020-12-09 ENCOUNTER — Other Ambulatory Visit: Payer: Self-pay

## 2020-12-09 ENCOUNTER — Ambulatory Visit: Payer: Medicare Other | Admitting: Physical Therapy

## 2020-12-09 ENCOUNTER — Encounter: Payer: Self-pay | Admitting: Physical Therapy

## 2020-12-09 DIAGNOSIS — R2681 Unsteadiness on feet: Secondary | ICD-10-CM | POA: Diagnosis not present

## 2020-12-09 DIAGNOSIS — M6281 Muscle weakness (generalized): Secondary | ICD-10-CM

## 2020-12-09 DIAGNOSIS — Z9181 History of falling: Secondary | ICD-10-CM

## 2020-12-09 NOTE — Therapy (Signed)
[place]. [place], [place], [address] Phone: [phone]   Fax:  [phone]  Physical Therapy Treatment  Patient Details  Name: Rachael Burke MRN: [id number] Date of Birth: 01/20/1956 Referring Provider (PT): Trey Sailors, [place]   Encounter Date: 12/09/2020   PT End of Session - 12/09/20 1612     Visit Number 9    Date for PT Re-Evaluation 02/04/21    PT Start Time 1518    PT Stop Time [id number]    PT Time Calculation (min) 39 min    Activity Tolerance Patient tolerated treatment well    Behavior During Therapy [facility] for tasks assessed/performed             Past Medical History:  Diagnosis Date   Anemia    Anxiety    Arthritis    Diabetes mellitus without complication ([place])    Heart murmur    echo 07/02/20: Mild MR/TR, mild-moderate AV sclerosis, bicuspid or functional bicuspid AV, no evidence of AS. Murmr felt due to AV.   History of blood transfusion    Hyperlipidemia    Hypertension    Pneumonia    PONV (postoperative nausea and vomiting)    Renal disorder     Past Surgical History:  Procedure Laterality Date   AV FISTULA PLACEMENT Left 05/20/2020   Procedure: INSERTION OF LEFT ARM ARTERIOVENOUS (AV) FISTULA;  Surgeon: Marty Heck, MD;  Location: [place];  Service: Vascular;  Laterality: Left;   [place] Left 08/05/2020   Procedure: SECOND STAGE BASILIC VEIN TRANSPOSITION;  Surgeon: Marty Heck, MD;  Location: [place];  Service: Vascular;  Laterality: Left;   CESAREAN SECTION     CHOLECYSTECTOMY     INSERTION OF DIALYSIS CATHETER N/A 05/20/2020   Procedure: INSERTION OF DIALYSIS CATHETER;  Surgeon: Marty Heck, MD;  Location: [facility] OR;  Service: Vascular;  Laterality: N/A;    There were no vitals filed for this visit.   Subjective Assessment - 12/09/20 1527     Subjective Pt reports that R knee is hurting her more today    Currently in Pain?  Yes    Pain Score 5     Pain Location Knee    Pain Orientation Right                               [facility] Adult PT Treatment/Exercise - 12/09/20 0001       High Level Balance   High Level Balance Activities Side stepping;Backward walking    High Level Balance Comments marching on airex, lateral step ups to airex, rhomberg EO/EC on airex      Knee/Hip Exercises: Aerobic   Recumbent Bike L1.5 x 4 min    Nustep L5 x 6 min UE/LE      Knee/Hip Exercises: Standing   Heel Raises Both;10 reps;2 sets    Knee Flexion Both;2 sets;10 reps    Knee Flexion Limitations 3#    Hip Flexion Both;10 reps;2 sets    Hip Flexion Limitations 3#    Hip Abduction Both;2 sets;10 reps    Abduction Limitations 3#    Hip Extension Both;2 sets;10 reps    Extension Limitations 3#    Walking with Sports Cord 20# x4 each direction      Knee/Hip Exercises: Seated   Long Arc Quad Both;2 sets;10 reps    Long Arc Quad Weight 3  lbs.    Long Arc Quad Limitations 1-2 sec hold with TKE    Marching Both;2 sets;10 reps    Marching Weights 3 lbs.                      PT Short Term Goals - 11/26/20 1410       PT SHORT TERM GOAL #1   Title Independent with initial HEP    Time 2    Status On-going    Target Date 11/26/20               PT Long Term Goals - 11/12/20 1323       PT LONG TERM GOAL #1   Title Independent with advanced HEP    Time 6    Period Weeks    Status New    Target Date 12/24/20      PT LONG TERM GOAL #2   Title Pt will achieve 5TSTS in less than 10 seconds with good control    Baseline 12 seconds on eval  11/12/20.    Time 8    Period Weeks    Status New    Target Date 01/07/21      PT LONG TERM GOAL #3   Title Pt will achieve gait speed of at least 1.2 m/second to facilitate safe community ambulation without fatigue or LOB>    Time 12    Period Weeks    Status New    Target Date 02/04/21      PT LONG TERM GOAL #4   Title Pt will  improve Berg score to at least 53/56 to demonstrate decreased falls risk    Baseline 11/12/20: 45/56 with most difficulty noted with SLS, tandem    Time 12    Period Weeks    Status New    Target Date 02/04/21      PT LONG TERM GOAL #5   Title Pt will achieve distance walked for at least 163.7 m on the 2MWT with good VSS to demonstrate improved aerobic capacity.    Baseline 138 m    Time 12    Period Weeks    Status New    Target Date 02/04/21      Additional Long Term Goals   Additional Long Term Goals Yes      PT LONG TERM GOAL #6   Title BLE strength to at least 4+/5    Time 8    Period Weeks    Status New    Target Date 01/07/21                   Plan - 12/09/20 1613     Clinical Impression Statement Pt reporting increased R knee pain at beginning of session, decreasing with movement and exercise. Close supervision-CGA with all high level balance ex's. Occasional LOB with resisted gait sidestepping. Cues to avoid compensations with standing hip ex's.    PT Treatment/Interventions ADLs/Self Care Home Management;Cryotherapy;Electrical Stimulation;Moist Heat;Iontophoresis '4mg'$ /ml Dexamethasone;Gait training;Therapeutic activities;Therapeutic exercise;Balance training;Manual techniques;Patient/family education;Taping;Functional mobility training;Neuromuscular re-education    PT Next Visit Plan LE strength and endurance exercises, mobility, and balance as tolerated. resisted gait    Consulted and Agree with Plan of Care Patient             Patient will benefit from skilled therapeutic intervention in order to improve the following deficits and impairments:  Difficulty walking, Abnormal gait, Dizziness, Pain, Decreased balance, Decreased mobility, Decreased strength, Decreased activity tolerance  Visit Diagnosis: Unsteadiness on feet  Muscle weakness (generalized)  History of falling     Problem List Patient Active Problem List   Diagnosis Date Noted   End  stage renal disease on dialysis ([place]) 07/23/2020   Generalized anxiety disorder 07/23/2020   Acute renal failure superimposed on chronic kidney disease ([place]) 12/29/2019   Anemia associated with chronic renal failure 12/29/2019   Symptomatic anemia 04/08/2019   Hyperlipidemia 04/08/2019   AKI (acute kidney injury) ([place]) 04/07/2019   Hypertension associated with diabetes ([place]) 04/06/2019   Type 2 diabetes mellitus with complication, without long-term current use of insulin ([place]) 01/04/2018   Amador Cunas, PT, DPT [place] Tomislav Micale 12/09/2020, 4:14 PM  [place]. [place], [place], [address] Phone: [phone]   Fax:  [phone]  Name: Rachael Burke MRN: [id number] Date of Birth: 1955-08-19

## 2020-12-11 ENCOUNTER — Ambulatory Visit: Payer: Medicare Other | Admitting: Physical Therapy

## 2020-12-17 ENCOUNTER — Ambulatory Visit: Payer: Medicare Other

## 2020-12-19 ENCOUNTER — Ambulatory Visit: Payer: Medicare Other

## 2020-12-24 ENCOUNTER — Other Ambulatory Visit: Payer: Self-pay

## 2020-12-24 ENCOUNTER — Ambulatory Visit: Payer: Medicare Other

## 2020-12-24 DIAGNOSIS — R2681 Unsteadiness on feet: Secondary | ICD-10-CM | POA: Diagnosis not present

## 2020-12-24 DIAGNOSIS — M542 Cervicalgia: Secondary | ICD-10-CM

## 2020-12-24 DIAGNOSIS — Z9181 History of falling: Secondary | ICD-10-CM

## 2020-12-24 DIAGNOSIS — M6281 Muscle weakness (generalized): Secondary | ICD-10-CM

## 2020-12-24 DIAGNOSIS — R29898 Other symptoms and signs involving the musculoskeletal system: Secondary | ICD-10-CM

## 2020-12-24 NOTE — Therapy (Signed)
[place]. [place], [place], [address] Phone: [phone]   Fax:  [phone]  Physical Therapy Treatment 10th visit Progress Note Reporting Period 11/12/20 to 12/24/20  See note below for Objective Data and Assessment of Progress/Goals.     Patient Details  Name: Rachael Burke MRN: [id number] Date of Birth: 03/19/1956 Referring Provider (PT): Trey Sailors, [place]   Encounter Date: 12/24/2020   PT End of Session - 12/24/20 1308     Visit Number 10    Date for PT Re-Evaluation 02/04/21    PT Start Time 1300    PT Stop Time 1345    PT Time Calculation (min) 45 min    Activity Tolerance Patient tolerated treatment well    Behavior During Therapy [facility] for tasks assessed/performed             Past Medical History:  Diagnosis Date   Anemia    Anxiety    Arthritis    Diabetes mellitus without complication ([place])    Heart murmur    echo 07/02/20: Mild MR/TR, mild-moderate AV sclerosis, bicuspid or functional bicuspid AV, no evidence of AS. Murmr felt due to AV.   History of blood transfusion    Hyperlipidemia    Hypertension    Pneumonia    PONV (postoperative nausea and vomiting)    Renal disorder     Past Surgical History:  Procedure Laterality Date   AV FISTULA PLACEMENT Left 05/20/2020   Procedure: INSERTION OF LEFT ARM ARTERIOVENOUS (AV) FISTULA;  Surgeon: Marty Heck, MD;  Location: [place];  Service: Vascular;  Laterality: Left;   [place] Left 08/05/2020   Procedure: SECOND STAGE BASILIC VEIN TRANSPOSITION;  Surgeon: Marty Heck, MD;  Location: [place];  Service: Vascular;  Laterality: Left;   CESAREAN SECTION     CHOLECYSTECTOMY     INSERTION OF DIALYSIS CATHETER N/A 05/20/2020   Procedure: INSERTION OF DIALYSIS CATHETER;  Surgeon: Marty Heck, MD;  Location: [facility] OR;  Service: Vascular;  Laterality: N/A;    There were no vitals filed for this  visit.   Subjective Assessment - 12/24/20 1303     Subjective some arm pain but only a little on the left. Had covid on 7/14 but has been asymptomatic, had symptoms one day, reportes not symptoms since    Currently in Pain? Yes    Pain Score 5     Pain Location Shoulder    Pain Orientation Left                [facility] PT Assessment - 12/24/20 0001       Berg Balance Test   Sit to Stand Able to stand without using hands and stabilize independently    Standing Unsupported Able to stand safely 2 minutes    Sitting with Back Unsupported but Feet Supported on Floor or Stool Able to sit safely and securely 2 minutes    Stand to Sit Sits safely with minimal use of hands    Transfers Able to transfer safely, minor use of hands    Standing Unsupported with Eyes Closed Able to stand 10 seconds safely    Standing Unsupported with Feet Together Able to place feet together independently and stand 1 minute safely    From Standing, Reach Forward with Outstretched Arm Can reach forward >12 cm safely (5")    From Standing Position, Pick up Object from Floor Able to pick up shoe  safely and easily    From Standing Position, Turn to Look Behind Over each Shoulder Looks behind from both sides and weight shifts well    Turn 360 Degrees Able to turn 360 degrees safely in 4 seconds or less    Standing Unsupported, Alternately Place Feet on Step/Stool Able to stand independently and complete 8 steps >20 seconds    Standing Unsupported, One Foot in Front Needs help to step but can hold 15 seconds    Standing on One Leg Tries to lift leg/unable to hold 3 seconds but remains standing independently    Total Score 48            5TSTS 11 seconds good control               [facility] Adult PT Treatment/Exercise - 12/24/20 0001       High Level Balance   High Level Balance Activities Tandem walking   manually resited gait 4 ways x 20 ft , 2 laps each.   High Level Balance Comments tandem stance  ball taps in  bars. step taps from airex to 6" x 10 B alt. forward and lateral  step up+ opp knee up x 10 B with HHA x 1.            STS on airex with yellow med ball 10 x 2           PT Short Term Goals - 11/26/20 1410       PT SHORT TERM GOAL #1   Title Independent with initial HEP    Time 2    Status On-going    Target Date 11/26/20               PT Long Term Goals - 12/24/20 1310       PT LONG TERM GOAL #1   Title Independent with advanced HEP    Time 6    Period Weeks    Status New      PT LONG TERM GOAL #2   Title Pt will achieve 5TSTS in less than 10 seconds with good control    Baseline 12 seconds on eval  11/12/20. 11 seconds 12/24/20, good control.    Time 8    Period Weeks    Status On-going      PT LONG TERM GOAL #3   Title Pt will achieve gait speed of at least 1.2 m/second to facilitate safe community ambulation without fatigue or LOB>    Time 12    Period Weeks    Status New      PT LONG TERM GOAL #4   Title Pt will improve Berg score to at least 53/56 to demonstrate decreased falls risk    Baseline 11/12/20: 45/56 with most difficulty noted with SLS, tandem    Time 12    Period Weeks    Status New      PT LONG TERM GOAL #5   Title Pt will achieve distance walked for at least 163.7 m on the 2MWT with good VSS to demonstrate improved aerobic capacity.    Baseline 138 m    Time 12    Period Weeks    Status On-going      PT LONG TERM GOAL #6   Title BLE strength to at least 4+/5    Time 8    Period Weeks    Status On-going  Plan - 12/24/20 1343     Clinical Impression Statement Rachael Burke is making overall good progress towards goals for strength and balance. She does need frequent cues for pacing with balance exercises especially those requioring NBOS or SLS as these are very unstable, especially with addition of a dynamic challenge in which case she requires HHA for safety.  discussed with pt and daughter  continuing HEP while they are away on vacation in august with plan to resume PT upon return, at which time we will reassess goals and progress.    PT Treatment/Interventions ADLs/Self Care Home Management;Cryotherapy;Electrical Stimulation;Moist Heat;Iontophoresis '4mg'$ /ml Dexamethasone;Gait training;Therapeutic activities;Therapeutic exercise;Balance training;Manual techniques;Patient/family education;Taping;Functional mobility training;Neuromuscular re-education    PT Next Visit Plan LE strength and endurance exercises, mobility, and balance as tolerated. resisted gait. reinforce HEP    Consulted and Agree with Plan of Care Patient             Patient will benefit from skilled therapeutic intervention in order to improve the following deficits and impairments:  Difficulty walking, Abnormal gait, Dizziness, Pain, Decreased balance, Decreased mobility, Decreased strength, Decreased activity tolerance  Visit Diagnosis: Unsteadiness on feet  Muscle weakness (generalized)  History of falling  Cervicalgia  Decreased ROM of neck     Problem List Patient Active Problem List   Diagnosis Date Noted   End stage renal disease on dialysis ([place]) 07/23/2020   Generalized anxiety disorder 07/23/2020   Acute renal failure superimposed on chronic kidney disease ([place]) 12/29/2019   Anemia associated with chronic renal failure 12/29/2019   Symptomatic anemia 04/08/2019   Hyperlipidemia 04/08/2019   AKI (acute kidney injury) ([place]) 04/07/2019   Hypertension associated with diabetes ([place]) 04/06/2019   Type 2 diabetes mellitus with complication, without long-term current use of insulin ([place]) 01/04/2018    Hall Busing, PT, DPT 12/24/2020, 1:46 PM  [place]. [place], [place], [address] Phone: [phone]   Fax:  [phone]  Name: Rachael Burke MRN: [id number] Date of Birth: Oct 03, 1955

## 2020-12-26 ENCOUNTER — Other Ambulatory Visit: Payer: Self-pay

## 2020-12-26 ENCOUNTER — Ambulatory Visit: Payer: Medicare Other

## 2020-12-26 DIAGNOSIS — R2681 Unsteadiness on feet: Secondary | ICD-10-CM

## 2020-12-26 DIAGNOSIS — R42 Dizziness and giddiness: Secondary | ICD-10-CM | POA: Diagnosis not present

## 2020-12-26 DIAGNOSIS — Z0000 Encounter for general adult medical examination without abnormal findings: Secondary | ICD-10-CM | POA: Diagnosis not present

## 2020-12-26 DIAGNOSIS — E782 Mixed hyperlipidemia: Secondary | ICD-10-CM | POA: Diagnosis not present

## 2020-12-26 DIAGNOSIS — R29898 Other symptoms and signs involving the musculoskeletal system: Secondary | ICD-10-CM

## 2020-12-26 DIAGNOSIS — E1122 Type 2 diabetes mellitus with diabetic chronic kidney disease: Secondary | ICD-10-CM | POA: Diagnosis not present

## 2020-12-26 DIAGNOSIS — K219 Gastro-esophageal reflux disease without esophagitis: Secondary | ICD-10-CM | POA: Diagnosis not present

## 2020-12-26 DIAGNOSIS — R531 Weakness: Secondary | ICD-10-CM | POA: Diagnosis not present

## 2020-12-26 DIAGNOSIS — Z9181 History of falling: Secondary | ICD-10-CM

## 2020-12-26 DIAGNOSIS — R269 Unspecified abnormalities of gait and mobility: Secondary | ICD-10-CM | POA: Diagnosis not present

## 2020-12-26 DIAGNOSIS — R110 Nausea: Secondary | ICD-10-CM | POA: Diagnosis not present

## 2020-12-26 DIAGNOSIS — I10 Essential (primary) hypertension: Secondary | ICD-10-CM | POA: Diagnosis not present

## 2020-12-26 DIAGNOSIS — Z992 Dependence on renal dialysis: Secondary | ICD-10-CM | POA: Diagnosis not present

## 2020-12-26 DIAGNOSIS — M542 Cervicalgia: Secondary | ICD-10-CM

## 2020-12-26 DIAGNOSIS — K5909 Other constipation: Secondary | ICD-10-CM | POA: Diagnosis not present

## 2020-12-26 DIAGNOSIS — N186 End stage renal disease: Secondary | ICD-10-CM | POA: Diagnosis not present

## 2020-12-26 DIAGNOSIS — M6281 Muscle weakness (generalized): Secondary | ICD-10-CM

## 2020-12-26 NOTE — Patient Instructions (Signed)
Access Code: [email or web address] URL: [email or web address] Date: 12/26/2020 Prepared by: Sherlynn Stalls  Exercises Standing Hip Extension with Counter Support - 1 x daily - 4 x weekly - 3 sets - 10 reps Standing Hip Abduction with Counter Support - 1 x daily - 4 x weekly - 3 sets - 10 reps Standing March with Counter Support - 1 x daily - 4 x weekly - 3 sets - 10 reps Tandem Stance in Corner - 1 x daily - 4 x weekly - 10 reps - 20 segundos hold Side Stepping with Counter Support - 1 x daily - 4 x weekly - 5 sets Backward Walking with Counter Support - 1 x daily - 4 x weekly - 5 sets Tandem Walking with Counter Support - 1 x daily - 7 x weekly - 3 sets - 10 reps Sit to Stand - 1 x daily - 4 x weekly - 3 sets - 10 reps Runner's Step Up/Down - 1 x daily - 7 x weekly - 2 sets - 10 reps

## 2020-12-26 NOTE — Therapy (Signed)
[place]. [place], [place], [address] Phone: [phone]   Fax:  [phone]  Physical Therapy Treatment  Patient Details  Name: Rachael Burke MRN: [id number] Date of Birth: 25-Jan-1956 Referring Provider (PT): Trey Sailors, [place]   Encounter Date: 12/26/2020   PT End of Session - 12/26/20 1005     Visit Number 11    Date for PT Re-Evaluation 02/04/21    PT Start Time 1000    PT Stop Time 1040    PT Time Calculation (min) 40 min    Activity Tolerance Patient tolerated treatment well    Behavior During Therapy [facility] for tasks assessed/performed             Past Medical History:  Diagnosis Date   Anemia    Anxiety    Arthritis    Diabetes mellitus without complication ([place])    Heart murmur    echo 07/02/20: Mild MR/TR, mild-moderate AV sclerosis, bicuspid or functional bicuspid AV, no evidence of AS. Murmr felt due to AV.   History of blood transfusion    Hyperlipidemia    Hypertension    Pneumonia    PONV (postoperative nausea and vomiting)    Renal disorder     Past Surgical History:  Procedure Laterality Date   AV FISTULA PLACEMENT Left 05/20/2020   Procedure: INSERTION OF LEFT ARM ARTERIOVENOUS (AV) FISTULA;  Surgeon: Marty Heck, MD;  Location: [place];  Service: Vascular;  Laterality: Left;   [place] Left 08/05/2020   Procedure: SECOND STAGE BASILIC VEIN TRANSPOSITION;  Surgeon: Marty Heck, MD;  Location: [place];  Service: Vascular;  Laterality: Left;   CESAREAN SECTION     CHOLECYSTECTOMY     INSERTION OF DIALYSIS CATHETER N/A 05/20/2020   Procedure: INSERTION OF DIALYSIS CATHETER;  Surgeon: Marty Heck, MD;  Location: [facility] OR;  Service: Vascular;  Laterality: N/A;    There were no vitals filed for this visit.   Subjective Assessment - 12/26/20 1004     Subjective Some rigth shoulder pain this morning but only a little bit. Everything  has been good.    Currently in Pain? Yes    Pain Score 4     Pain Location Shoulder    Pain Orientation Right               Standing Hip Extension , Standing Hip Abduction , Standing March  with 1 UE support and SBA - 2 x 10 each side.  Tandem Stance in Corner 10 " x 5 each side leading. With head turns x 10 each side leading.  Side Stepping, backward walking, tandem wlaking wit 1 UE support as needed - 5 laps.  Sit to Stand 3 sets - 10 reps Runner's Step Up/Down at 6" step with 1 UE support. 2 sets - 10 reps each leg    Nustep L5 x 6 minutes      PT Education - 12/26/20 1045     Education Details PT initial POC and HEP. Access Code: [facility]    Person(s) Educated Patient    Methods Explanation;Demonstration;Handout    Comprehension Verbalized understanding;Returned demonstration              PT Short Term Goals - 11/26/20 1410       PT SHORT TERM GOAL #1   Title Independent with initial HEP    Time 2    Status On-going    Target Date  11/26/20               PT Long Term Goals - 12/24/20 1310       PT LONG TERM GOAL #1   Title Independent with advanced HEP    Time 6    Period Weeks    Status New      PT LONG TERM GOAL #2   Title Pt will achieve 5TSTS in less than 10 seconds with good control    Baseline 12 seconds on eval  11/12/20. 11 seconds 12/24/20, good control.    Time 8    Period Weeks    Status On-going      PT LONG TERM GOAL #3   Title Pt will achieve gait speed of at least 1.2 m/second to facilitate safe community ambulation without fatigue or LOB>    Time 12    Period Weeks    Status New      PT LONG TERM GOAL #4   Title Pt will improve Berg score to at least 53/56 to demonstrate decreased falls risk    Baseline 11/12/20: 45/56 with most difficulty noted with SLS, tandem    Time 12    Period Weeks    Status New      PT LONG TERM GOAL #5   Title Pt will achieve distance walked for at least 163.7 m on the 2MWT with good VSS  to demonstrate improved aerobic capacity.    Baseline 138 m    Time 12    Period Weeks    Status On-going      PT LONG TERM GOAL #6   Title BLE strength to at least 4+/5    Time 8    Period Weeks    Status On-going                   Plan - 12/26/20 1043     Clinical Impression Statement Todays session spent on reviewing safe performance of updated HEP components. [place] was educated in use of 1 UE support for standing balance exercises, and to progress this by hovering hands over a support surface in case she needs them for balance. Encouraged continued HEP while on vacation at least 2-3x/week to work on continued strength, balance and endurance gains. Pt demod safety with all exercises, plan to reassess upon return from vacation    PT Treatment/Interventions ADLs/Self Care Home Management;Cryotherapy;Electrical Stimulation;Moist Heat;Iontophoresis '4mg'$ /ml Dexamethasone;Gait training;Therapeutic activities;Therapeutic exercise;Balance training;Manual techniques;Patient/family education;Taping;Functional mobility training;Neuromuscular re-education    PT Next Visit Plan reassess upon return from vacation    Consulted and Agree with Plan of Care Patient             Patient will benefit from skilled therapeutic intervention in order to improve the following deficits and impairments:  Difficulty walking, Abnormal gait, Dizziness, Pain, Decreased balance, Decreased mobility, Decreased strength, Decreased activity tolerance  Visit Diagnosis: Unsteadiness on feet  Muscle weakness (generalized)  History of falling  Cervicalgia  Decreased ROM of neck     Problem List Patient Active Problem List   Diagnosis Date Noted   End stage renal disease on dialysis ([place]) 07/23/2020   Generalized anxiety disorder 07/23/2020   Acute renal failure superimposed on chronic kidney disease ([place]) 12/29/2019   Anemia associated with chronic renal failure 12/29/2019   Symptomatic anemia  04/08/2019   Hyperlipidemia 04/08/2019   AKI (acute kidney injury) ([place]) 04/07/2019   Hypertension associated with diabetes ([place]) 04/06/2019   Type 2 diabetes mellitus with complication,  without long-term current use of insulin ([place]) 01/04/2018    Hall Busing, PT, DPT 12/26/2020, 10:46 AM  [place]. [place], [place], [address] Phone: [phone]   Fax:  [phone]  Name: [place] Matczak MRN: [id number] Date of Birth: 1955-07-04

## 2021-02-04 DIAGNOSIS — N186 End stage renal disease: Secondary | ICD-10-CM | POA: Diagnosis not present

## 2021-02-04 DIAGNOSIS — R42 Dizziness and giddiness: Secondary | ICD-10-CM | POA: Diagnosis not present

## 2021-02-04 DIAGNOSIS — K5909 Other constipation: Secondary | ICD-10-CM | POA: Diagnosis not present

## 2021-02-04 DIAGNOSIS — Z992 Dependence on renal dialysis: Secondary | ICD-10-CM | POA: Diagnosis not present

## 2021-02-04 DIAGNOSIS — R269 Unspecified abnormalities of gait and mobility: Secondary | ICD-10-CM | POA: Diagnosis not present

## 2021-02-04 DIAGNOSIS — E782 Mixed hyperlipidemia: Secondary | ICD-10-CM | POA: Diagnosis not present

## 2021-02-04 DIAGNOSIS — K219 Gastro-esophageal reflux disease without esophagitis: Secondary | ICD-10-CM | POA: Diagnosis not present

## 2021-02-04 DIAGNOSIS — R110 Nausea: Secondary | ICD-10-CM | POA: Diagnosis not present

## 2021-02-04 DIAGNOSIS — E1122 Type 2 diabetes mellitus with diabetic chronic kidney disease: Secondary | ICD-10-CM | POA: Diagnosis not present

## 2021-02-04 DIAGNOSIS — I10 Essential (primary) hypertension: Secondary | ICD-10-CM | POA: Diagnosis not present

## 2021-02-04 DIAGNOSIS — R531 Weakness: Secondary | ICD-10-CM | POA: Diagnosis not present

## 2021-02-19 ENCOUNTER — Ambulatory Visit: Payer: Medicare Other | Attending: Physician Assistant | Admitting: Physical Therapy

## 2021-02-19 ENCOUNTER — Encounter: Payer: Self-pay | Admitting: Physical Therapy

## 2021-02-19 ENCOUNTER — Other Ambulatory Visit: Payer: Self-pay

## 2021-02-19 DIAGNOSIS — R2689 Other abnormalities of gait and mobility: Secondary | ICD-10-CM | POA: Diagnosis not present

## 2021-02-19 DIAGNOSIS — R2681 Unsteadiness on feet: Secondary | ICD-10-CM | POA: Diagnosis present

## 2021-02-19 NOTE — Patient Instructions (Signed)
Access Code: [email or web address] URL: [email or web address] Date: 02/19/2021 Prepared by: Almyra Free  Exercises Single Leg Stance with Support - 1 x daily - 7 x weekly - 1 sets - 5 reps - max hold Tandem Stance - 1 x daily - 7 x weekly - 1 sets - 5 reps - max hold

## 2021-02-19 NOTE — Therapy (Signed)
[place] [place], [place], [address] Phone: [phone]   Fax:  [phone]  Physical Therapy Evaluation  Patient Details  Name: Rachael Burke MRN: [id number] Date of Birth: 12-Sep-1955 Referring Provider (PT): Raelyn Number PA   Encounter Date: 02/19/2021   PT End of Session - 02/19/21 1427     Visit Number 1    Number of Visits 8    Date for PT Re-Evaluation 04/18/21    Authorization Type [place] MEDICAID [employer]    PT Start Time 0230    PT Stop Time 0315    PT Time Calculation (min) 45 min    Activity Tolerance Patient tolerated treatment well    Behavior During Therapy [facility] for tasks assessed/performed             Past Medical History:  Diagnosis Date   Anemia    Anxiety    Arthritis    Diabetes mellitus without complication ([place])    Heart murmur    echo 07/02/20: Mild MR/TR, mild-moderate AV sclerosis, bicuspid or functional bicuspid AV, no evidence of AS. Murmr felt due to AV.   History of blood transfusion    Hyperlipidemia    Hypertension    Pneumonia    PONV (postoperative nausea and vomiting)    Renal disorder     Past Surgical History:  Procedure Laterality Date   AV FISTULA PLACEMENT Left 05/20/2020   Procedure: INSERTION OF LEFT ARM ARTERIOVENOUS (AV) FISTULA;  Surgeon: Marty Heck, MD;  Location: [place];  Service: Vascular;  Laterality: Left;   [employer] Left 08/05/2020   Procedure: SECOND STAGE BASILIC VEIN TRANSPOSITION;  Surgeon: Marty Heck, MD;  Location: [place];  Service: Vascular;  Laterality: Left;   CESAREAN SECTION     CHOLECYSTECTOMY     INSERTION OF DIALYSIS CATHETER N/A 05/20/2020   Procedure: INSERTION OF DIALYSIS CATHETER;  Surgeon: Marty Heck, MD;  Location[facility] OR;  Service: Vascular;  Laterality: N/A;    There were no vitals filed for this visit.    Subjective Assessment - 02/19/21 1432      Subjective Patient feels better than the last time she was here. She reports she does not walk in a straight line. She denies weakness like she had before.    Patient is accompained by: Interpreter    Pertinent History dialysis port 3d / wk MWF dialysis port in left upper arm. HTN, DM, arthritis    Patient Stated Goals gent stronger, improve balance, less tired    Currently in Pain? No/denies                [facility] PT Assessment - 02/19/21 0001       Assessment   Medical Diagnosis generalized weakness and abnormal gait    Referring Provider (PT) Raelyn Number PA    Onset Date/Surgical Date --   2 years   Hand Dominance Right    Prior Therapy yes      Precautions   Precautions None    Precaution Comments Dialysis port left brachial      Restrictions   Weight Bearing Restrictions No      Balance Screen   Has the patient fallen in the past 6 months No    Has the patient had a decrease in activity level because of a fear of falling?  No    Is the patient reluctant to leave their home because of a fear  of falling?  No      Home Environment   Living Environment Private residence    Living Arrangements Children    Type of [place] Access Level entry    [place] One level    Additional Comments lives with daughter      Prior Function   Level of Independence Independent    Vocation Retired      Mining engineer Comments WFL      ROM / Strength   AROM / PROM / Strength AROM;Strength      AROM   Overall AROM Comments BLE WFL      Strength   Overall Strength Comments grossly 5/5 in BLE      Flexibility   Soft Tissue Assessment /Muscle Length --   some tightness in bil gastroc/soleus     Transfers   Transfers Sit to Stand    Sit to Stand 7: Independent    Five time sit to stand comments  10.75      Ambulation/Gait   Ambulation/Gait Yes    Ambulation/Gait Assistance 7: Independent    Ambulation Distance (Feet) 60 Feet     Assistive device None    Gait Pattern Step-through pattern;Decreased dorsiflexion - right;Decreased dorsiflexion - left;Scissoring   scissoring of RLE; pt drifts left   Ambulation Surface Level    Gait velocity WNL      6 Minute Walk- Baseline   6 Minute Walk- Baseline yes    Perceived Rate of Exertion ([place]) 12-      6 minute walk test results    Aerobic Endurance Distance Walked 1270      High Level Balance   High Level Balance Activities Tandem walking;Other (comment)    High Level Balance Comments Able to walk 10 ft; SLS less than 2 sec bil      Standardized Balance Assessment   Standardized Balance Assessment Five Times Sit to Stand    Five times sit to stand comments  10.75                        Objective measurements completed on examination: See above findings.                PT Education - 02/19/21 1524     Education Details HEP; discussed POC    Person(s) Educated Patient    Methods Explanation;Demonstration;Handout    Comprehension Verbalized understanding;Returned demonstration              PT Short Term Goals - 02/19/21 1531       PT SHORT TERM GOAL #1   Title Independent with initial HEP    Time 2    Period Weeks    Status New    Target Date 03/05/21               PT Long Term Goals - 02/19/21 1532       PT LONG TERM GOAL #1   Title Independent with advanced HEP    Baseline has HEP from previous episode    Time 4    Period Weeks    Status New    Target Date 03/21/21      PT LONG TERM GOAL #2   Title Pt able to safely amb 500 feet without scissoring or drifting.    Baseline scissoring and drifting with 60 ft    Time 4    Period Weeks  Status New      PT LONG TERM GOAL #3   Title Pt to demonstrate improved balance by increasing SLS B to 8 sec or more    Baseline < 2 sec    Time 4    Period Weeks    Status New      PT LONG TERM GOAL #4   Title -      PT LONG TERM GOAL #5   Title -      PT  LONG TERM GOAL #6   Title -                    Plan - 02/19/21 1525     Clinical Impression Statement Patient returns to clinic with c/o of drifting while walking. She denies weakness or LOB. Evaluation reveals improved strength since last visit one month ago. She has 5/5 strength in BLE with MMT and her 5x sit to stand is 10.75 sec which is below the norm for her age. She does demonstrate scissoring of the R LE and drifting to the left with gait. She has decreased heel strike bil and has functional weakness with SLS B which is limited to  < 2 sec. She would benefit from skilled PT to address her gait and balance deficits.    Personal Factors and Comorbidities Age;Time since onset of injury/illness/exacerbation;Comorbidity 1;Comorbidity 2;Comorbidity 3+    Comorbidities DM, arthritis, anxiety, kidney disease/ hemodialysis    Stability/Clinical Decision Making Stable/Uncomplicated    Clinical Decision Making Low    Rehab Potential Excellent    PT Frequency 2x / week    PT Duration 4 weeks    PT Treatment/Interventions ADLs/Self Care Home Management;Gait training;Therapeutic exercise;Therapeutic activities;Balance training;Neuromuscular re-education;Manual techniques;Patient/family education    PT Next Visit Plan work on correcting gait deviations and balance; add gastroc stretch to HEP    PT Home Exercise Plan [id number]    Consulted and Agree with Plan of Care Patient             Patient will benefit from skilled therapeutic intervention in order to improve the following deficits and impairments:  Abnormal gait, Decreased balance, Decreased strength, Impaired flexibility  Visit Diagnosis: Other abnormalities of gait and mobility  Unsteadiness on feet     Problem List Patient Active Problem List   Diagnosis Date Noted   End stage renal disease on dialysis ([place]) 07/23/2020   Generalized anxiety disorder 07/23/2020   Acute renal failure superimposed on chronic kidney  disease ([place]) 12/29/2019   Anemia associated with chronic renal failure 12/29/2019   Symptomatic anemia 04/08/2019   Hyperlipidemia 04/08/2019   AKI (acute kidney injury) ([place]) 04/07/2019   Hypertension associated with diabetes ([place]) 04/06/2019   Type 2 diabetes mellitus with complication, without long-term current use of insulin ([place]) 01/04/2018   Madelyn Flavors, PT 02/19/2021, 3:38 PM  [address]. [place], [place], [address] Phone: [phone]   Fax:  [phone]  Name: Rachael Burke MRN: [id number] Date of Birth: [birth year]

## 2021-02-25 ENCOUNTER — Ambulatory Visit: Payer: Medicare Other | Admitting: Physical Therapy

## 2021-02-25 ENCOUNTER — Other Ambulatory Visit: Payer: Self-pay

## 2021-02-25 DIAGNOSIS — R2689 Other abnormalities of gait and mobility: Secondary | ICD-10-CM

## 2021-02-25 DIAGNOSIS — R2681 Unsteadiness on feet: Secondary | ICD-10-CM

## 2021-02-25 NOTE — Therapy (Signed)
[place]. [place], [place], [address] Phone: [phone]   Fax:  [phone]  Physical Therapy Treatment  Patient Details  Name: Rachael Burke MRN: [id number] Date of Birth: 1955-08-18 Referring Provider (PT): Raelyn Number PA   Encounter Date: 02/25/2021   PT End of Session - 02/25/21 1012     Visit Number 2    Number of Visits 8    Date for PT Re-Evaluation 04/18/21    Authorization Type [place] MEDICAID [employer]HEALTHCARE COMMUNITY    PT Start Time 0933    PT Stop Time 1011    PT Time Calculation (min) 38 min    Equipment Utilized During Treatment Gait belt    Activity Tolerance Patient tolerated treatment well   dizziness   Behavior During Therapy [facility] for tasks assessed/performed             Past Medical History:  Diagnosis Date   Anemia    Anxiety    Arthritis    Diabetes mellitus without complication ([place])    Heart murmur    echo 07/02/20: Mild MR/TR, mild-moderate AV sclerosis, bicuspid or functional bicuspid AV, no evidence of AS. Murmr felt due to AV.   History of blood transfusion    Hyperlipidemia    Hypertension    Pneumonia    PONV (postoperative nausea and vomiting)    Renal disorder     Past Surgical History:  Procedure Laterality Date   AV FISTULA PLACEMENT Left 05/20/2020   Procedure: INSERTION OF LEFT ARM ARTERIOVENOUS (AV) FISTULA;  Surgeon: Marty Heck, MD;  Location: [place];  Service: Vascular;  Laterality: Left;   [place] Left 08/05/2020   Procedure: SECOND STAGE BASILIC VEIN TRANSPOSITION;  Surgeon: Marty Heck, MD;  Location: [place];  Service: Vascular;  Laterality: Left;   CESAREAN SECTION     CHOLECYSTECTOMY     INSERTION OF DIALYSIS CATHETER N/A 05/20/2020   Procedure: INSERTION OF DIALYSIS CATHETER;  Surgeon: Marty Heck, MD;  Location: [facility] OR;  Service: Vascular;  Laterality: N/A;    There were no vitals filed for  this visit.   Subjective Assessment - 02/25/21 0934     Subjective Doing good. Has been doing her HEP 2x/day. Has had vertigo for 2 years and still has it sometimes- when getting up too fast and walking. Denies dizziness when laying in bed.    Patient is accompained by: Interpreter    Pertinent History dialysis port 3d / wk MWF dialysis port in left upper arm. HTN, DM, arthritis    Patient Stated Goals gent stronger, improve balance, less tired    Currently in Pain? No/denies                [facility] PT Assessment - 02/25/21 0001       Standardized Balance Assessment   Standardized Balance Assessment Dynamic Gait Index      Dynamic Gait Index   Level Surface Normal    Change in Gait Speed Mild Impairment    Gait with Horizontal Head Turns Normal    Gait with Vertical Head Turns Mild Impairment    Gait and Pivot Turn Normal    Step Over Obstacle Normal    Step Around Obstacles Normal    Steps Normal    Total Score 22    DGI comment: no dizziness reported  [facility] Adult PT Treatment/Exercise - 02/25/21 0001       Neuro Re-ed    Neuro Re-ed Details  tandem and reverse tandem with HHA, SLS, rolling ball under foot CW/CCW with 2 finger support on II bar; forward/back step with head nods to targets 10x   cues to decrease speed and touch heel to toe each time     Knee/Hip Exercises: Aerobic   Nustep L6 x 2 min UE/LE, L3 x 4 min                     PT Education - 02/25/21 1012     Education Details update to HEP; edu on importance of expressing if feeling unwell/dizzy during sessions    Person(s) Educated Patient    Methods Explanation;Demonstration;Tactile cues;Verbal cues;Handout    Comprehension Verbalized understanding;Returned demonstration              PT Short Term Goals - 02/25/21 1015       PT SHORT TERM GOAL #1   Title Independent with initial HEP    Time 2    Period Weeks    Status Achieved    Target  Date 03/05/21               PT Long Term Goals - 02/25/21 1015       PT LONG TERM GOAL #1   Title Independent with advanced HEP    Baseline has HEP from previous episode    Time 4    Period Weeks    Status On-going      PT LONG TERM GOAL #2   Title Pt able to safely amb 500 feet without scissoring or drifting.    Baseline scissoring and drifting with 60 ft    Time 4    Period Weeks    Status On-going      PT LONG TERM GOAL #3   Title Pt to demonstrate improved balance by increasing SLS B to 8 sec or more    Baseline < 2 sec    Time 4    Period Weeks    Status On-going      PT LONG TERM GOAL #4   Title -      PT LONG TERM GOAL #5   Title -      PT LONG TERM GOAL #6   Title -                   Plan - 02/25/21 1013     Clinical Impression Statement Patient arrived to session without new complaints. DGI assessment revealed most trouble with vertical head nods and change in gait speed. Patient scored 22/24, indicating a decreased risk of falls. Patient performed static and dynamic balance exercises with challenges including narrow BOS and head turns. Demonstrated quick pace and trouble approximating feet with tandem walk, and difficulty maintaining SLS. Improved success with SLS with ball under foot. Patient with mild-moderate instability with stepping with head nods and reported mild dizziness. Had patient sit to allow this to resolve. Patient reported understanding of HEP update for exercise that was well-tolerated today and reported no complaints upon leaving.    Personal Factors and Comorbidities Age;Time since onset of injury/illness/exacerbation;Comorbidity 1;Comorbidity 2;Comorbidity 3+    Comorbidities DM, arthritis, anxiety, kidney disease/ hemodialysis    Stability/Clinical Decision Making Stable/Uncomplicated    Rehab Potential Excellent    PT Frequency 2x / week    PT Duration 4 weeks    PT  Treatment/Interventions ADLs/Self Care Home  Management;Gait training;Therapeutic exercise;Therapeutic activities;Balance training;Neuromuscular re-education;Manual techniques;Patient/family education    PT Next Visit Plan work on correcting gait deviations and balance; add gastroc stretch to HEP    PT Home Exercise Plan [id number]    Consulted and Agree with Plan of Care Patient             Patient will benefit from skilled therapeutic intervention in order to improve the following deficits and impairments:  Abnormal gait, Decreased balance, Decreased strength, Impaired flexibility  Visit Diagnosis: Other abnormalities of gait and mobility  Unsteadiness on feet     Problem List Patient Active Problem List   Diagnosis Date Noted   End stage renal disease on dialysis ([place]) 07/23/2020   Generalized anxiety disorder 07/23/2020   Acute renal failure superimposed on chronic kidney disease ([place]) 12/29/2019   Anemia associated with chronic renal failure 12/29/2019   Symptomatic anemia 04/08/2019   Hyperlipidemia 04/08/2019   AKI (acute kidney injury) ([place]) 04/07/2019   Hypertension associated with diabetes ([place]) 04/06/2019   Type 2 diabetes mellitus with complication, without long-term current use of insulin ([place]) 01/04/2018    Janene Harvey, PT, DPT 02/25/21 10:16 AM   [place]. [place], [place], [address] Phone: [phone]   Fax:  [phone]  Name: Rachael Burke MRN: [id number] Date of Birth: May 01, 1956

## 2021-02-27 ENCOUNTER — Encounter: Payer: Self-pay | Admitting: Physical Therapy

## 2021-02-27 ENCOUNTER — Ambulatory Visit: Payer: Medicare Other | Admitting: Physical Therapy

## 2021-02-27 ENCOUNTER — Other Ambulatory Visit: Payer: Self-pay

## 2021-02-27 DIAGNOSIS — R2689 Other abnormalities of gait and mobility: Secondary | ICD-10-CM | POA: Diagnosis not present

## 2021-02-27 DIAGNOSIS — R2681 Unsteadiness on feet: Secondary | ICD-10-CM

## 2021-02-27 NOTE — Therapy (Signed)
[place]. [place], [place], [address] Phone: [phone]   Fax:  [phone]  Physical Therapy Treatment  Patient Details  Name: Rachael Burke MRN: [id number] Date of Birth: 1956-05-13 Referring Provider (PT): Raelyn Number PA   Encounter Date: 02/27/2021   PT End of Session - 02/27/21 1015     Visit Number 3    Number of Visits 8    Date for PT Re-Evaluation 04/18/21    Authorization Type [place] MEDICAID UNITEDHEALTHCARE COMMUNITY    PT Start Time 0932    PT Stop Time 1011    PT Time Calculation (min) 39 min    Equipment Utilized During Treatment Gait belt    Activity Tolerance Patient tolerated treatment well   dizziness   Behavior During Therapy [facility] for tasks assessed/performed             Past Medical History:  Diagnosis Date   Anemia    Anxiety    Arthritis    Diabetes mellitus without complication ([place])    Heart murmur    echo 07/02/20: Mild MR/TR, mild-moderate AV sclerosis, bicuspid or functional bicuspid AV, no evidence of AS. Murmr felt due to AV.   History of blood transfusion    Hyperlipidemia    Hypertension    Pneumonia    PONV (postoperative nausea and vomiting)    Renal disorder     Past Surgical History:  Procedure Laterality Date   AV FISTULA PLACEMENT Left 05/20/2020   Procedure: INSERTION OF LEFT ARM ARTERIOVENOUS (AV) FISTULA;  Surgeon: Marty Heck, MD;  Location: [place];  Service: Vascular;  Laterality: Left;   [place] Left 08/05/2020   Procedure: SECOND STAGE BASILIC VEIN TRANSPOSITION;  Surgeon: Marty Heck, MD;  Location: [place];  Service: Vascular;  Laterality: Left;   CESAREAN SECTION     CHOLECYSTECTOMY     INSERTION OF DIALYSIS CATHETER N/A 05/20/2020   Procedure: INSERTION OF DIALYSIS CATHETER;  Surgeon: Marty Heck, MD;  Location: [facility] OR;  Service: Vascular;  Laterality: N/A;    There were no vitals filed for  this visit.   Subjective Assessment - 02/27/21 0933     Subjective Doing good. Has not tried HEP d/t feeling fatigued d/t dialysis yesterday.    Patient is accompained by: Interpreter    Pertinent History dialysis port 3d / wk MWF dialysis port in left upper arm. HTN, DM, arthritis    Patient Stated Goals gent stronger, improve balance, less tired    Currently in Pain? No/denies                [facility] PT Assessment - 02/27/21 0001       Strength   Strength Assessment Site Ankle    Right/Left Ankle Right;Left    Right Ankle Dorsiflexion 4+/5    Right Ankle Plantar Flexion 4+/5    Right Ankle Inversion 4+/5    Right Ankle Eversion 4+/5    Left Ankle Dorsiflexion 4+/5    Left Ankle Plantar Flexion 4+/5    Left Ankle Inversion 4+/5    Left Ankle Eversion 4+/5                           [facility] Adult PT Treatment/Exercise - 02/27/21 0001       Neuro Re-ed    Neuro Re-ed Details  R/L forward/back step with head nods to targets 10x with CGA/min A;  R/L SLS + rolling ball under opposite foot Cw/CCW x20 with 1 ski pole and intermimttent CGA-mod A; march on foam x1 min      Knee/Hip Exercises: Aerobic   Recumbent Bike L1.5 x 6 min      Knee/Hip Exercises: Seated   Sit to Sand 2 sets;5 reps;without UE support   on foam; 5x EO, 5x EC            Vestibular Treatment/Exercise - 02/27/21 0001       Vestibular Treatment/Exercise   Gaze Exercises X1 Viewing Horizontal;X1 Viewing Vertical;Comment      X1 Viewing Horizontal   Foot Position romberg    Reps --   2x20   Comments CGA/min A d/t imbalance      X1 Viewing Vertical   Foot Position romberg, 1 foot on 6" step    Reps --   2x10   Comments CGA      Eye/Head Exercise Vertical   Comment romberg + VOR cancellation x20   very minor instability                     PT Short Term Goals - 02/25/21 1015       PT SHORT TERM GOAL #1   Title Independent with initial HEP    Time 2    Period  Weeks    Status Achieved    Target Date 03/05/21               PT Long Term Goals - 02/25/21 1015       PT LONG TERM GOAL #1   Title Independent with advanced HEP    Baseline has HEP from previous episode    Time 4    Period Weeks    Status On-going      PT LONG TERM GOAL #2   Title Pt able to safely amb 500 feet without scissoring or drifting.    Baseline scissoring and drifting with 60 ft    Time 4    Period Weeks    Status On-going      PT LONG TERM GOAL #3   Title Pt to demonstrate improved balance by increasing SLS B to 8 sec or more    Baseline < 2 sec    Time 4    Period Weeks    Status On-going      PT LONG TERM GOAL #4   Title -      PT LONG TERM GOAL #5   Title -      PT LONG TERM GOAL #6   Title -                   Plan - 02/27/21 1016     Clinical Impression Statement Patient arrived to session with report of not yet trying her HEP d/t feeling fatigued after dialysis yesterday. Worked on standing gaze stabilization exercises to challenge both balance and VOR. Patient initially demonstrated some difficulty with maintaining gaze with horizontal head turns, which improved with practice. Upon discussion with patient, she reports dizziness worse after dialysis and reports that she is on BP meds. Plan to test orthostatics next session. Patient also does report being on a fluid restriction. Patient demonstrated improved tolerance for stepping with head nodding activity today, however patient required CGA-min A d/t LOB to L. More trouble with balance on R LE with SLS activities, thus assessed ankle strength which showed no apparent weakness. Encouraged patient to perform HEP on  non-dialysis days for max benefit. Patient reported understanding and without complaints at end of session.    Personal Factors and Comorbidities Age;Time since onset of injury/illness/exacerbation;Comorbidity 1;Comorbidity 2;Comorbidity 3+    Comorbidities DM, arthritis, anxiety,  kidney disease/ hemodialysis    Stability/Clinical Decision Making Stable/Uncomplicated    Rehab Potential Excellent    PT Frequency 2x / week    PT Duration 4 weeks    PT Treatment/Interventions ADLs/Self Care Home Management;Gait training;Therapeutic exercise;Therapeutic activities;Balance training;Neuromuscular re-education;Manual techniques;Patient/family education    PT Next Visit Plan work on correcting gait deviations and balance; add gastroc stretch to HEP    PT Home Exercise Plan [id number]    Consulted and Agree with Plan of Care Patient             Patient will benefit from skilled therapeutic intervention in order to improve the following deficits and impairments:  Abnormal gait, Decreased balance, Decreased strength, Impaired flexibility  Visit Diagnosis: Other abnormalities of gait and mobility  Unsteadiness on feet     Problem List Patient Active Problem List   Diagnosis Date Noted   End stage renal disease on dialysis ([place]) 07/23/2020   Generalized anxiety disorder 07/23/2020   Acute renal failure superimposed on chronic kidney disease ([place]) 12/29/2019   Anemia associated with chronic renal failure 12/29/2019   Symptomatic anemia 04/08/2019   Hyperlipidemia 04/08/2019   AKI (acute kidney injury) ([place]) 04/07/2019   Hypertension associated with diabetes ([place]) 04/06/2019   Type 2 diabetes mellitus with complication, without long-term current use of insulin ([place]) 01/04/2018     Janene Harvey, PT, DPT 02/27/21 10:17 AM   [place] [address]. [place], [place], [address] Phone: [phone]   Fax:  [phone]  Name: Marcele Avilez MRN: [id number] Date of Birth: 01-12-1956

## 2021-03-01 DIAGNOSIS — N186 End stage renal disease: Secondary | ICD-10-CM | POA: Diagnosis not present

## 2021-03-01 DIAGNOSIS — Z992 Dependence on renal dialysis: Secondary | ICD-10-CM | POA: Diagnosis not present

## 2021-03-01 DIAGNOSIS — E1129 Type 2 diabetes mellitus with other diabetic kidney complication: Secondary | ICD-10-CM | POA: Diagnosis not present

## 2021-03-03 DIAGNOSIS — N2581 Secondary hyperparathyroidism of renal origin: Secondary | ICD-10-CM | POA: Diagnosis not present

## 2021-03-03 DIAGNOSIS — D631 Anemia in chronic kidney disease: Secondary | ICD-10-CM | POA: Diagnosis not present

## 2021-03-03 DIAGNOSIS — D509 Iron deficiency anemia, unspecified: Secondary | ICD-10-CM | POA: Diagnosis not present

## 2021-03-03 DIAGNOSIS — R110 Nausea: Secondary | ICD-10-CM | POA: Diagnosis not present

## 2021-03-03 DIAGNOSIS — N186 End stage renal disease: Secondary | ICD-10-CM | POA: Diagnosis not present

## 2021-03-03 DIAGNOSIS — E1122 Type 2 diabetes mellitus with diabetic chronic kidney disease: Secondary | ICD-10-CM | POA: Diagnosis not present

## 2021-03-03 DIAGNOSIS — R197 Diarrhea, unspecified: Secondary | ICD-10-CM | POA: Diagnosis not present

## 2021-03-03 DIAGNOSIS — D689 Coagulation defect, unspecified: Secondary | ICD-10-CM | POA: Diagnosis not present

## 2021-03-03 DIAGNOSIS — Z992 Dependence on renal dialysis: Secondary | ICD-10-CM | POA: Diagnosis not present

## 2021-03-04 ENCOUNTER — Encounter: Payer: Self-pay | Admitting: Physical Therapy

## 2021-03-04 ENCOUNTER — Other Ambulatory Visit: Payer: Self-pay

## 2021-03-04 ENCOUNTER — Ambulatory Visit: Payer: Medicare Other | Attending: Physician Assistant | Admitting: Physical Therapy

## 2021-03-04 DIAGNOSIS — R2689 Other abnormalities of gait and mobility: Secondary | ICD-10-CM | POA: Insufficient documentation

## 2021-03-04 DIAGNOSIS — Z9181 History of falling: Secondary | ICD-10-CM | POA: Insufficient documentation

## 2021-03-04 DIAGNOSIS — M542 Cervicalgia: Secondary | ICD-10-CM | POA: Diagnosis not present

## 2021-03-04 DIAGNOSIS — M6281 Muscle weakness (generalized): Secondary | ICD-10-CM | POA: Diagnosis not present

## 2021-03-04 DIAGNOSIS — R2681 Unsteadiness on feet: Secondary | ICD-10-CM | POA: Insufficient documentation

## 2021-03-04 NOTE — Patient Instructions (Signed)
Access Code: [email or web address] URL: [email or web address] Date: 03/04/2021 Prepared by: Cheri Fowler  Exercises Gastroc Stretch on Wall - 1 x daily - 7 x weekly - 3 sets - 10 reps

## 2021-03-04 NOTE — Therapy (Signed)
[place]. [place], [place], [address] Phone: [phone]   Fax:  [phone]  Physical Therapy Treatment  Patient Details  Name: Rachael Burke MRN: [id number] Date of Birth: 1955/06/09 Referring Provider (PT): Raelyn Number PA   Encounter Date: 03/04/2021   PT End of Session - 03/04/21 1057     Visit Number 4    Date for PT Re-Evaluation 04/18/21    Authorization Type [place] MEDICAID [employer]    PT Start Time 1015    PT Stop Time 1059    PT Time Calculation (min) 44 min    Activity Tolerance Patient tolerated treatment well    Behavior During Therapy [facility] for tasks assessed/performed             Past Medical History:  Diagnosis Date   Anemia    Anxiety    Arthritis    Diabetes mellitus without complication ([place])    Heart murmur    echo 07/02/20: Mild MR/TR, mild-moderate AV sclerosis, bicuspid or functional bicuspid AV, no evidence of AS. Murmr felt due to AV.   History of blood transfusion    Hyperlipidemia    Hypertension    Pneumonia    PONV (postoperative nausea and vomiting)    Renal disorder     Past Surgical History:  Procedure Laterality Date   AV FISTULA PLACEMENT Left 05/20/2020   Procedure: INSERTION OF LEFT ARM ARTERIOVENOUS (AV) FISTULA;  Surgeon: Marty Heck, MD;  Location: [place];  Service: Vascular;  Laterality: Left;   [place] Left 08/05/2020   Procedure: SECOND STAGE BASILIC VEIN TRANSPOSITION;  Surgeon: Marty Heck, MD;  Location: [place];  Service: Vascular;  Laterality: Left;   CESAREAN SECTION     CHOLECYSTECTOMY     INSERTION OF DIALYSIS CATHETER N/A 05/20/2020   Procedure: INSERTION OF DIALYSIS CATHETER;  Surgeon: Marty Heck, MD;  Location: [facility] OR;  Service: Vascular;  Laterality: N/A;    There were no vitals filed for this visit.   Subjective Assessment - 03/04/21 1017     Subjective "Good"  Compliance with HEP    Currently in Pain? No/denies                               [facility] Adult PT Treatment/Exercise - 03/04/21 0001       High Level Balance   High Level Balance Comments Alt cone taps 2x10; side step on and off airex      Knee/Hip Exercises: Aerobic   Recumbent Bike L1.5 x 6 min      Knee/Hip Exercises: Machines for Strengthening   Cybex Knee Extension 5lb 2x10    Cybex Knee Flexion 20lb 2x10      Knee/Hip Exercises: Standing   Forward Step Up Both;1 set;10 reps;Hand Hold: 0;Step Height: 6"   starting on airex     Knee/Hip Exercises: Seated   Sit to Sand 2 sets;10 reps;without UE support   LE on airex                      PT Short Term Goals - 02/25/21 1015       PT SHORT TERM GOAL #1   Title Independent with initial HEP    Time 2    Period Weeks    Status Achieved    Target Date 03/05/21  PT Long Term Goals - 02/25/21 1015       PT LONG TERM GOAL #1   Title Independent with advanced HEP    Baseline has HEP from previous episode    Time 4    Period Weeks    Status On-going      PT LONG TERM GOAL #2   Title Pt able to safely amb 500 feet without scissoring or drifting.    Baseline scissoring and drifting with 60 ft    Time 4    Period Weeks    Status On-going      PT LONG TERM GOAL #3   Title Pt to demonstrate improved balance by increasing SLS B to 8 sec or more    Baseline < 2 sec    Time 4    Period Weeks    Status On-going      PT LONG TERM GOAL #4   Title -      PT LONG TERM GOAL #5   Title -      PT LONG TERM GOAL #6   Title -                   Plan - 03/04/21 1057     Clinical Impression Statement Pt enters clinic reporting HEP compliance and no pain. Pt reports issues with both legs from her knee down. Some Le weakness and instability noted with sit to stands on airex. SBA to CGA needed with side step on airex and with step up starting on airex. Added machine  level LE strengthening with reports of LE fatigue. Added gastroc stretch to HEP pt provided demonstration and reports verbal understanding.    Personal Factors and Comorbidities Age;Time since onset of injury/illness/exacerbation;Comorbidity 1;Comorbidity 2;Comorbidity 3+    Comorbidities DM, arthritis, anxiety, kidney disease/ hemodialysis    Examination-Activity Limitations Lift;Reach Overhead;Carry    Examination-Participation Restrictions Other    Stability/Clinical Decision Making Stable/Uncomplicated    Rehab Potential Excellent    PT Frequency 2x / week    PT Treatment/Interventions ADLs/Self Care Home Management;Gait training;Therapeutic exercise;Therapeutic activities;Balance training;Neuromuscular re-education;Manual techniques;Patient/family education    PT Next Visit Plan work on correcting gait deviations and balance             Patient will benefit from skilled therapeutic intervention in order to improve the following deficits and impairments:  Abnormal gait, Decreased balance, Decreased strength, Impaired flexibility  Visit Diagnosis: Unsteadiness on feet  History of falling  Muscle weakness (generalized)  Other abnormalities of gait and mobility     Problem List Patient Active Problem List   Diagnosis Date Noted   End stage renal disease on dialysis ([place]) 07/23/2020   Generalized anxiety disorder 07/23/2020   Acute renal failure superimposed on chronic kidney disease ([place]) 12/29/2019   Anemia associated with chronic renal failure 12/29/2019   Symptomatic anemia 04/08/2019   Hyperlipidemia 04/08/2019   AKI (acute kidney injury) ([place]) 04/07/2019   Hypertension associated with diabetes ([place]) 04/06/2019   Type 2 diabetes mellitus with complication, without long-term current use of insulin ([place]) 01/04/2018    Scot Jun, PTA 03/04/2021, 11:00 AM  [place]. [place], [place],  [address] Phone: [phone]   Fax:  [phone]  Name: Rachael Burke MRN: [id number] Date of Birth: 22-Feb-1956

## 2021-03-05 DIAGNOSIS — Z992 Dependence on renal dialysis: Secondary | ICD-10-CM | POA: Diagnosis not present

## 2021-03-05 DIAGNOSIS — N2581 Secondary hyperparathyroidism of renal origin: Secondary | ICD-10-CM | POA: Diagnosis not present

## 2021-03-05 DIAGNOSIS — E1122 Type 2 diabetes mellitus with diabetic chronic kidney disease: Secondary | ICD-10-CM | POA: Diagnosis not present

## 2021-03-05 DIAGNOSIS — D509 Iron deficiency anemia, unspecified: Secondary | ICD-10-CM | POA: Diagnosis not present

## 2021-03-05 DIAGNOSIS — R197 Diarrhea, unspecified: Secondary | ICD-10-CM | POA: Diagnosis not present

## 2021-03-05 DIAGNOSIS — R110 Nausea: Secondary | ICD-10-CM | POA: Diagnosis not present

## 2021-03-05 DIAGNOSIS — D689 Coagulation defect, unspecified: Secondary | ICD-10-CM | POA: Diagnosis not present

## 2021-03-05 DIAGNOSIS — N186 End stage renal disease: Secondary | ICD-10-CM | POA: Diagnosis not present

## 2021-03-05 DIAGNOSIS — D631 Anemia in chronic kidney disease: Secondary | ICD-10-CM | POA: Diagnosis not present

## 2021-03-06 ENCOUNTER — Ambulatory Visit: Payer: Medicare Other | Admitting: Physical Therapy

## 2021-03-06 ENCOUNTER — Encounter: Payer: Self-pay | Admitting: Physical Therapy

## 2021-03-06 ENCOUNTER — Other Ambulatory Visit: Payer: Self-pay

## 2021-03-06 DIAGNOSIS — Z9181 History of falling: Secondary | ICD-10-CM

## 2021-03-06 DIAGNOSIS — R2681 Unsteadiness on feet: Secondary | ICD-10-CM

## 2021-03-06 DIAGNOSIS — R2689 Other abnormalities of gait and mobility: Secondary | ICD-10-CM | POA: Diagnosis not present

## 2021-03-06 DIAGNOSIS — M6281 Muscle weakness (generalized): Secondary | ICD-10-CM

## 2021-03-06 DIAGNOSIS — M542 Cervicalgia: Secondary | ICD-10-CM | POA: Diagnosis not present

## 2021-03-06 NOTE — Therapy (Signed)
[place]. [place], [place], [address] Phone: [phone]   Fax:  [phone]  Physical Therapy Treatment  Patient Details  Name: Rachael Burke MRN: [id number] Date of Birth: 03-31-1956 Referring Provider (PT): Raelyn Number PA   Encounter Date: 03/06/2021   PT End of Session - 03/06/21 1053     Visit Number 5    Date for PT Re-Evaluation 04/18/21    Authorization Type [place] MEDICAID [employer]HE[employer] COMMUNITY    PT Start Time 1015    PT Stop Time 1058    PT Time Calculation (min) 43 min    Behavior During Therapy [facility] for tasks assessed/performed             Past Medical History:  Diagnosis Date   Anemia    Anxiety    Arthritis    Diabetes mellitus without complication ([place])    Heart murmur    echo 07/02/20: Mild MR/TR, mild-moderate AV sclerosis, bicuspid or functional bicuspid AV, no evidence of AS. Murmr felt due to AV.   History of blood transfusion    Hyperlipidemia    Hypertension    Pneumonia    PONV (postoperative nausea and vomiting)    Renal disorder     Past Surgical History:  Procedure Laterality Date   AV FISTULA PLACEMENT Left 05/20/2020   Procedure: INSERTION OF LEFT ARM ARTERIOVENOUS (AV) FISTULA;  Surgeon: Marty Heck, MD;  Location: [place];  Service: Vascular;  Laterality: Left;   [place] Left 08/05/2020   Procedure: SECOND STAGE BASILIC VEIN TRANSPOSITION;  Surgeon: Marty Heck, MD;  Location: [place];  Service: Vascular;  Laterality: Left;   CESAREAN SECTION     CHOLECYSTECTOMY     INSERTION OF DIALYSIS CATHETER N/A 05/20/2020   Procedure: INSERTION OF DIALYSIS CATHETER;  Surgeon: Marty Heck, MD;  Location: [facility] OR;  Service: Vascular;  Laterality: N/A;    There were no vitals filed for this visit.   Subjective Assessment - 03/06/21 1021     Subjective Doing so, so but more so on the good side    Patient is accompained  by: Interpreter    Patient Stated Goals gent stronger, improve balance, less tired    Currently in Pain? No/denies                               [facility] Adult PT Treatment/Exercise - 03/06/21 0001       Neck Exercises: Machines for Strengthening   UBE (Upper Arm Bike) L 1.4 x 3 min      Neck Exercises: Standing   Other Standing Exercises Shoulder Ext 5lb 2x10    Other Standing Exercises Rows 10lb 2x10      Knee/Hip Exercises: Aerobic   Recumbent Bike L2.1 x 6 min      Knee/Hip Exercises: Machines for Strengthening   Cybex Knee Extension 5lb 2x10    Cybex Knee Flexion 20lb 2x10      Knee/Hip Exercises: Standing   Heel Raises Both;10 reps;2 sets    Lateral Step Up Right;Left;1 set;10 reps;Hand Hold: 0;Step Height: 6"    Forward Step Up Both;1 set;10 reps;Hand Hold: 0;Step Height: 6"      Knee/Hip Exercises: Seated   Sit to Sand 2 sets;10 reps;without UE support   OHP yellow ball  PT Short Term Goals - 02/25/21 1015       PT SHORT TERM GOAL #1   Title Independent with initial HEP    Time 2    Period Weeks    Status Achieved    Target Date 03/05/21               PT Long Term Goals - 02/25/21 1015       PT LONG TERM GOAL #1   Title Independent with advanced HEP    Baseline has HEP from previous episode    Time 4    Period Weeks    Status On-going      PT LONG TERM GOAL #2   Title Pt able to safely amb 500 feet without scissoring or drifting.    Baseline scissoring and drifting with 60 ft    Time 4    Period Weeks    Status On-going      PT LONG TERM GOAL #3   Title Pt to demonstrate improved balance by increasing SLS B to 8 sec or more    Baseline < 2 sec    Time 4    Period Weeks    Status On-going      PT LONG TERM GOAL #4   Title -      PT LONG TERM GOAL #5   Title -      PT LONG TERM GOAL #6   Title -                   Plan - 03/06/21 1055     Clinical Impression Statement  Pt continues to progress well towards goals. Some fatigue reported with sit to stands and step ups. Core weakness present with standing rows and extensions requiring constant cue to maintain good posture. No pain reported with today's activities.    Personal Factors and Comorbidities Age;Time since onset of injury/illness/exacerbation;Comorbidity 1;Comorbidity 2;Comorbidity 3+    Comorbidities DM, arthritis, anxiety, kidney disease/ hemodialysis    Examination-Activity Limitations Lift;Reach Overhead;Carry    Stability/Clinical Decision Making Stable/Uncomplicated    Rehab Potential Excellent    PT Frequency 2x / week    PT Duration 4 weeks    PT Treatment/Interventions ADLs/Self Care Home Management;Gait training;Therapeutic exercise;Therapeutic activities;Balance training;Neuromuscular re-education;Manual techniques;Patient/family education    PT Next Visit Plan work on correcting gait deviations and balance             Patient will benefit from skilled therapeutic intervention in order to improve the following deficits and impairments:  Abnormal gait, Decreased balance, Decreased strength, Impaired flexibility, Decreased endurance  Visit Diagnosis: History of falling  Muscle weakness (generalized)  Unsteadiness on feet     Problem List Patient Active Problem List   Diagnosis Date Noted   End stage renal disease on dialysis ([place]) 07/23/2020   Generalized anxiety disorder 07/23/2020   Acute renal failure superimposed on chronic kidney disease ([place]) 12/29/2019   Anemia associated with chronic renal failure 12/29/2019   Symptomatic anemia 04/08/2019   Hyperlipidemia 04/08/2019   AKI (acute kidney injury) ([place]) 04/07/2019   Hypertension associated with diabetes ([place]) 04/06/2019   Type 2 diabetes mellitus with complication, without long-term current use of insulin ([place]) 01/04/2018    Scot Jun, PTA 03/06/2021, 10:58 AM  [place]. [place], [place], [address] Phone: [phone]   Fax:  [phone]  Name: Rachael Burke MRN: [id number] Date of Birth: Mar 05, 1956

## 2021-03-07 DIAGNOSIS — N2581 Secondary hyperparathyroidism of renal origin: Secondary | ICD-10-CM | POA: Diagnosis not present

## 2021-03-07 DIAGNOSIS — D689 Coagulation defect, unspecified: Secondary | ICD-10-CM | POA: Diagnosis not present

## 2021-03-07 DIAGNOSIS — R197 Diarrhea, unspecified: Secondary | ICD-10-CM | POA: Diagnosis not present

## 2021-03-07 DIAGNOSIS — N186 End stage renal disease: Secondary | ICD-10-CM | POA: Diagnosis not present

## 2021-03-07 DIAGNOSIS — E1122 Type 2 diabetes mellitus with diabetic chronic kidney disease: Secondary | ICD-10-CM | POA: Diagnosis not present

## 2021-03-07 DIAGNOSIS — Z992 Dependence on renal dialysis: Secondary | ICD-10-CM | POA: Diagnosis not present

## 2021-03-07 DIAGNOSIS — R110 Nausea: Secondary | ICD-10-CM | POA: Diagnosis not present

## 2021-03-07 DIAGNOSIS — D509 Iron deficiency anemia, unspecified: Secondary | ICD-10-CM | POA: Diagnosis not present

## 2021-03-07 DIAGNOSIS — D631 Anemia in chronic kidney disease: Secondary | ICD-10-CM | POA: Diagnosis not present

## 2021-03-10 DIAGNOSIS — D509 Iron deficiency anemia, unspecified: Secondary | ICD-10-CM | POA: Diagnosis not present

## 2021-03-10 DIAGNOSIS — R197 Diarrhea, unspecified: Secondary | ICD-10-CM | POA: Diagnosis not present

## 2021-03-10 DIAGNOSIS — Z992 Dependence on renal dialysis: Secondary | ICD-10-CM | POA: Diagnosis not present

## 2021-03-10 DIAGNOSIS — N186 End stage renal disease: Secondary | ICD-10-CM | POA: Diagnosis not present

## 2021-03-10 DIAGNOSIS — D689 Coagulation defect, unspecified: Secondary | ICD-10-CM | POA: Diagnosis not present

## 2021-03-10 DIAGNOSIS — E1122 Type 2 diabetes mellitus with diabetic chronic kidney disease: Secondary | ICD-10-CM | POA: Diagnosis not present

## 2021-03-10 DIAGNOSIS — N2581 Secondary hyperparathyroidism of renal origin: Secondary | ICD-10-CM | POA: Diagnosis not present

## 2021-03-10 DIAGNOSIS — D631 Anemia in chronic kidney disease: Secondary | ICD-10-CM | POA: Diagnosis not present

## 2021-03-10 DIAGNOSIS — R110 Nausea: Secondary | ICD-10-CM | POA: Diagnosis not present

## 2021-03-11 ENCOUNTER — Encounter: Payer: Self-pay | Admitting: Physical Therapy

## 2021-03-11 ENCOUNTER — Other Ambulatory Visit: Payer: Self-pay

## 2021-03-11 ENCOUNTER — Ambulatory Visit: Payer: Medicare Other | Admitting: Physical Therapy

## 2021-03-11 DIAGNOSIS — M542 Cervicalgia: Secondary | ICD-10-CM | POA: Diagnosis not present

## 2021-03-11 DIAGNOSIS — R2689 Other abnormalities of gait and mobility: Secondary | ICD-10-CM | POA: Diagnosis not present

## 2021-03-11 DIAGNOSIS — M6281 Muscle weakness (generalized): Secondary | ICD-10-CM | POA: Diagnosis not present

## 2021-03-11 DIAGNOSIS — R2681 Unsteadiness on feet: Secondary | ICD-10-CM | POA: Diagnosis not present

## 2021-03-11 DIAGNOSIS — Z9181 History of falling: Secondary | ICD-10-CM

## 2021-03-11 NOTE — Therapy (Signed)
[place]. [place], [place], [address] Phone: [phone]   Fax:  [phone]  Physical Therapy Treatment  Patient Details  Name: Rachael Burke MRN: [id number] Date of Birth: [birth year] Referring Provider (PT): Raelyn Number PA   Encounter Date: 03/11/2021   PT End of Session - 03/11/21 1100     Visit Number 6    Date for PT Re-Evaluation 04/18/21    PT Start Time 1007    PT Stop Time 1055    PT Time Calculation (min) 48 min    Activity Tolerance Patient tolerated treatment well    Behavior During Therapy [facility] for tasks assessed/performed             Past Medical History:  Diagnosis Date   Anemia    Anxiety    Arthritis    Diabetes mellitus without complication ([place])    Heart murmur    echo 07/02/20: Mild MR/TR, mild-moderate AV sclerosis, bicuspid or functional bicuspid AV, no evidence of AS. Murmr felt due to AV.   History of blood transfusion    Hyperlipidemia    Hypertension    Pneumonia    PONV (postoperative nausea and vomiting)    Renal disorder     Past Surgical History:  Procedure Laterality Date   AV FISTULA PLACEMENT Left 05/20/2020   Procedure: INSERTION OF LEFT ARM ARTERIOVENOUS (AV) FISTULA;  Surgeon: Marty Heck, MD;  Location: [place];  Service: Vascular;  Laterality: Left;   [place] Left 08/05/2020   Procedure: SECOND STAGE BASILIC VEIN TRANSPOSITION;  Surgeon: Marty Heck, MD;  Location: [place];  Service: Vascular;  Laterality: Left;   CESAREAN SECTION     CHOLECYSTECTOMY     INSERTION OF DIALYSIS CATHETER N/A 05/20/2020   Procedure: INSERTION OF DIALYSIS CATHETER;  Surgeon: Marty Heck, MD;  Location: [facility] OR;  Service: Vascular;  Laterality: N/A;    There were no vitals filed for this visit.   Subjective Assessment - 03/11/21 1011     Subjective No falls, legs just feel weak    Currently in Pain? No/denies                                [facility] Adult PT Treatment/Exercise - 03/11/21 0001       High Level Balance   High Level Balance Comments on airex NBOS, WBOS, head turns side to side and up and down and then did with eyes closed, airex balance beam side stepping and tandem walking      Knee/Hip Exercises: Aerobic   Nustep level 5 x 6 minutes      Knee/Hip Exercises: Machines for Strengthening   Cybex Knee Extension 5lb 3x10    Cybex Knee Flexion 20lb 3x10    Cybex Leg Press 20# 2x10    Other Machine 5# straight arm pulls cues for posture and core activation      Knee/Hip Exercises: Standing   Walking with Sports Cord all directions      Knee/Hip Exercises: Seated   Other Seated Knee/Hip Exercises physio ball in lap isometric abs                       PT Short Term Goals - 02/25/21 1015       PT SHORT TERM GOAL #1   Title Independent with initial HEP    Time 2  Period Weeks    Status Achieved    Target Date 03/05/21               PT Long Term Goals - 03/11/21 1134       PT LONG TERM GOAL #1   Title Independent with advanced HEP    Status Partially Met      PT LONG TERM GOAL #2   Title Pt able to safely amb 500 feet without scissoring or drifting.    Status Partially Met                   Plan - 03/11/21 1133     Clinical Impression Statement I added more strength more sets and the leg press, I also added resisted gait and some increaed balance, she was fatigued with the weights and the resisted gait, she had a few issues with being off balance on the airex with the eyes closed.  Seems to be improving the biggest issue she reports is at times off balance and feels weak in the legs    PT Next Visit Plan work on balance and strength as well as increaesd function    Consulted and Agree with Plan of Care Patient             Patient will benefit from skilled therapeutic intervention in order to improve the following deficits and  impairments:  Abnormal gait, Decreased balance, Decreased strength, Impaired flexibility, Decreased endurance  Visit Diagnosis: Muscle weakness (generalized)  Unsteadiness on feet  Other abnormalities of gait and mobility  Cervicalgia  History of falling     Problem List Patient Active Problem List   Diagnosis Date Noted   End stage renal disease on dialysis ([place]) 07/23/2020   Generalized anxiety disorder 07/23/2020   Acute renal failure superimposed on chronic kidney disease ([place]) 12/29/2019   Anemia associated with chronic renal failure 12/29/2019   Symptomatic anemia 04/08/2019   Hyperlipidemia 04/08/2019   AKI (acute kidney injury) ([place]) 04/07/2019   Hypertension associated with diabetes ([place]) 04/06/2019   Type 2 diabetes mellitus with complication, without long-term current use of insulin ([place]) 01/04/2018    Sumner Boast, PT 03/11/2021, 11:35 AM  [place]. [place], [place], [address] Phone: [phone]   Fax:  [phone]  Name: Rachael Burke MRN: [id number] Date of Birth: 10-06-1955

## 2021-03-12 DIAGNOSIS — Z992 Dependence on renal dialysis: Secondary | ICD-10-CM | POA: Diagnosis not present

## 2021-03-12 DIAGNOSIS — D509 Iron deficiency anemia, unspecified: Secondary | ICD-10-CM | POA: Diagnosis not present

## 2021-03-12 DIAGNOSIS — N186 End stage renal disease: Secondary | ICD-10-CM | POA: Diagnosis not present

## 2021-03-12 DIAGNOSIS — D689 Coagulation defect, unspecified: Secondary | ICD-10-CM | POA: Diagnosis not present

## 2021-03-12 DIAGNOSIS — N2581 Secondary hyperparathyroidism of renal origin: Secondary | ICD-10-CM | POA: Diagnosis not present

## 2021-03-12 DIAGNOSIS — R110 Nausea: Secondary | ICD-10-CM | POA: Diagnosis not present

## 2021-03-12 DIAGNOSIS — D631 Anemia in chronic kidney disease: Secondary | ICD-10-CM | POA: Diagnosis not present

## 2021-03-12 DIAGNOSIS — R197 Diarrhea, unspecified: Secondary | ICD-10-CM | POA: Diagnosis not present

## 2021-03-12 DIAGNOSIS — E1122 Type 2 diabetes mellitus with diabetic chronic kidney disease: Secondary | ICD-10-CM | POA: Diagnosis not present

## 2021-03-13 ENCOUNTER — Other Ambulatory Visit: Payer: Self-pay

## 2021-03-13 ENCOUNTER — Encounter: Payer: Self-pay | Admitting: Physical Therapy

## 2021-03-13 ENCOUNTER — Ambulatory Visit: Payer: Medicare Other | Admitting: Physical Therapy

## 2021-03-13 DIAGNOSIS — M6281 Muscle weakness (generalized): Secondary | ICD-10-CM

## 2021-03-13 DIAGNOSIS — Z9181 History of falling: Secondary | ICD-10-CM | POA: Diagnosis not present

## 2021-03-13 DIAGNOSIS — M542 Cervicalgia: Secondary | ICD-10-CM

## 2021-03-13 DIAGNOSIS — R2689 Other abnormalities of gait and mobility: Secondary | ICD-10-CM | POA: Diagnosis not present

## 2021-03-13 DIAGNOSIS — R2681 Unsteadiness on feet: Secondary | ICD-10-CM

## 2021-03-13 NOTE — Therapy (Signed)
[place]. [place], [place], [address] Phone: [phone]   Fax:  [phone]  Physical Therapy Treatment  Patient Details  Name: Rachael Burke MRN: [id number] Date of Birth: 1956/03/12 Referring Provider (PT): Raelyn Number PA   Encounter Date: 03/13/2021   PT End of Session - 03/13/21 1055     Visit Number 7    Date for PT Re-Evaluation 04/18/21    Authorization Type [place] MEDICAID UNITEDHEALTHCARE COMMUNITY    PT Start Time 1013    PT Stop Time 1058    PT Time Calculation (min) 45 min    Activity Tolerance Patient tolerated treatment well    Behavior During Therapy [facility] for tasks assessed/performed             Past Medical History:  Diagnosis Date   Anemia    Anxiety    Arthritis    Diabetes mellitus without complication ([place])    Heart murmur    echo 07/02/20: Mild MR/TR, mild-moderate AV sclerosis, bicuspid or functional bicuspid AV, no evidence of AS. Murmr felt due to AV.   History of blood transfusion    Hyperlipidemia    Hypertension    Pneumonia    PONV (postoperative nausea and vomiting)    Renal disorder     Past Surgical History:  Procedure Laterality Date   AV FISTULA PLACEMENT Left 05/20/2020   Procedure: INSERTION OF LEFT ARM ARTERIOVENOUS (AV) FISTULA;  Surgeon: Marty Heck, MD;  Location: [place];  Service: Vascular;  Laterality: Left;   [place] Left 08/05/2020   Procedure: SECOND STAGE BASILIC VEIN TRANSPOSITION;  Surgeon: Marty Heck, MD;  Location: [place];  Service: Vascular;  Laterality: Left;   CESAREAN SECTION     CHOLECYSTECTOMY     INSERTION OF DIALYSIS CATHETER N/A 05/20/2020   Procedure: INSERTION OF DIALYSIS CATHETER;  Surgeon: Marty Heck, MD;  Location: [facility] OR;  Service: Vascular;  Laterality: N/A;    There were no vitals filed for this visit.   Subjective Assessment - 03/13/21 1015     Subjective I liked what  you did last time, I think that helps me feel safer and stronger    Currently in Pain? No/denies                               [facility] Adult PT Treatment/Exercise - 03/13/21 0001       Ambulation/Gait   Gait Comments outside around the back building at a brisk pace, she did struggle coming up the hill      High Level Balance   High Level Balance Comments stepping over obstacles and then over obstacles onto airex with close CGA, airex balance beam side stepping and tandem walking, sit to stand with weighted ball push out on airex 2x5      Knee/Hip Exercises: Aerobic   Nustep level 5 x 6 minutes      Knee/Hip Exercises: Machines for Strengthening   Cybex Knee Extension 5lb 3x10    Cybex Knee Flexion 20lb 3x10    Cybex Leg Press 20# 3x10    Other Machine 5# straight arm pulls cues for posture and core activation, 10# AR press                       PT Short Term Goals - 02/25/21 1015       PT  SHORT TERM GOAL #1   Title Independent with initial HEP    Time 2    Period Weeks    Status Achieved    Target Date 03/05/21               PT Long Term Goals - 03/13/21 1056       PT LONG TERM GOAL #1   Title Independent with advanced HEP    Status Partially Met      PT LONG TERM GOAL #2   Title Pt able to safely amb 500 feet without scissoring or drifting.    Status Partially Met      PT LONG TERM GOAL #3   Title Pt to demonstrate improved balance by increasing SLS B to 8 sec or more    Status Partially Met                   Plan - 03/13/21 1055     Clinical Impression Statement Patient doing well with the strength, needs some rest, she reports issues with fatigue and starts to drift and feels like she will fall.  She struggles on the dynamic surfaces but overall doing very well with this    PT Next Visit Plan work on balance and strength as well as increaesd function    Consulted and Agree with Plan of Care Patient              Patient will benefit from skilled therapeutic intervention in order to improve the following deficits and impairments:  Abnormal gait, Decreased balance, Decreased strength, Impaired flexibility, Decreased endurance  Visit Diagnosis: Muscle weakness (generalized)  Unsteadiness on feet  Other abnormalities of gait and mobility  Cervicalgia  History of falling     Problem List Patient Active Problem List   Diagnosis Date Noted   End stage renal disease on dialysis ([place]) 07/23/2020   Generalized anxiety disorder 07/23/2020   Acute renal failure superimposed on chronic kidney disease ([place]) 12/29/2019   Anemia associated with chronic renal failure 12/29/2019   Symptomatic anemia 04/08/2019   Hyperlipidemia 04/08/2019   AKI (acute kidney injury) ([place]) 04/07/2019   Hypertension associated with diabetes ([place]) 04/06/2019   Type 2 diabetes mellitus with complication, without long-term current use of insulin ([place]) 01/04/2018    Sumner Boast, PT 03/13/2021, 10:58 AM  [place]. [place], [place], [address] Phone: [phone]   Fax:  [phone]  Name: Rachael Burke MRN: [id number] Date of Birth: 1955-09-09

## 2021-03-14 DIAGNOSIS — D509 Iron deficiency anemia, unspecified: Secondary | ICD-10-CM | POA: Diagnosis not present

## 2021-03-14 DIAGNOSIS — E1122 Type 2 diabetes mellitus with diabetic chronic kidney disease: Secondary | ICD-10-CM | POA: Diagnosis not present

## 2021-03-14 DIAGNOSIS — N186 End stage renal disease: Secondary | ICD-10-CM | POA: Diagnosis not present

## 2021-03-14 DIAGNOSIS — Z992 Dependence on renal dialysis: Secondary | ICD-10-CM | POA: Diagnosis not present

## 2021-03-14 DIAGNOSIS — R197 Diarrhea, unspecified: Secondary | ICD-10-CM | POA: Diagnosis not present

## 2021-03-14 DIAGNOSIS — N2581 Secondary hyperparathyroidism of renal origin: Secondary | ICD-10-CM | POA: Diagnosis not present

## 2021-03-14 DIAGNOSIS — R110 Nausea: Secondary | ICD-10-CM | POA: Diagnosis not present

## 2021-03-14 DIAGNOSIS — D689 Coagulation defect, unspecified: Secondary | ICD-10-CM | POA: Diagnosis not present

## 2021-03-14 DIAGNOSIS — D631 Anemia in chronic kidney disease: Secondary | ICD-10-CM | POA: Diagnosis not present

## 2021-03-17 DIAGNOSIS — N2581 Secondary hyperparathyroidism of renal origin: Secondary | ICD-10-CM | POA: Diagnosis not present

## 2021-03-17 DIAGNOSIS — D509 Iron deficiency anemia, unspecified: Secondary | ICD-10-CM | POA: Diagnosis not present

## 2021-03-17 DIAGNOSIS — D631 Anemia in chronic kidney disease: Secondary | ICD-10-CM | POA: Diagnosis not present

## 2021-03-17 DIAGNOSIS — N186 End stage renal disease: Secondary | ICD-10-CM | POA: Diagnosis not present

## 2021-03-17 DIAGNOSIS — R110 Nausea: Secondary | ICD-10-CM | POA: Diagnosis not present

## 2021-03-17 DIAGNOSIS — E1122 Type 2 diabetes mellitus with diabetic chronic kidney disease: Secondary | ICD-10-CM | POA: Diagnosis not present

## 2021-03-17 DIAGNOSIS — R197 Diarrhea, unspecified: Secondary | ICD-10-CM | POA: Diagnosis not present

## 2021-03-17 DIAGNOSIS — Z992 Dependence on renal dialysis: Secondary | ICD-10-CM | POA: Diagnosis not present

## 2021-03-17 DIAGNOSIS — D689 Coagulation defect, unspecified: Secondary | ICD-10-CM | POA: Diagnosis not present

## 2021-03-18 ENCOUNTER — Encounter: Payer: Self-pay | Admitting: Physical Therapy

## 2021-03-18 ENCOUNTER — Ambulatory Visit: Payer: Medicare Other | Admitting: Physical Therapy

## 2021-03-18 ENCOUNTER — Other Ambulatory Visit: Payer: Self-pay

## 2021-03-18 DIAGNOSIS — R2689 Other abnormalities of gait and mobility: Secondary | ICD-10-CM | POA: Diagnosis not present

## 2021-03-18 DIAGNOSIS — Z9181 History of falling: Secondary | ICD-10-CM | POA: Diagnosis not present

## 2021-03-18 DIAGNOSIS — M542 Cervicalgia: Secondary | ICD-10-CM | POA: Diagnosis not present

## 2021-03-18 DIAGNOSIS — M6281 Muscle weakness (generalized): Secondary | ICD-10-CM | POA: Diagnosis not present

## 2021-03-18 DIAGNOSIS — R2681 Unsteadiness on feet: Secondary | ICD-10-CM | POA: Diagnosis not present

## 2021-03-18 NOTE — Therapy (Signed)
[place]. [place], [place], [address] Phone: [phone]   Fax:  [phone]  Physical Therapy Treatment  Patient Details  Name: Rachael Burke MRN: [id number] Date of Birth: [birth year] Referring Provider (PT): Raelyn Number PA   Encounter Date: 03/18/2021   PT End of Session - 03/18/21 1056     Visit Number 8    Date for PT Re-Evaluation 04/18/21    Authorization Type [place] MEDICAID [employer]    PT Start Time 1015    PT Stop Time 1056    PT Time Calculation (min) 41 min    Activity Tolerance Patient tolerated treatment well    Behavior During Therapy [facility] for tasks assessed/performed             Past Medical History:  Diagnosis Date   Anemia    Anxiety    Arthritis    Diabetes mellitus without complication ([place])    Heart murmur    echo 07/02/20: Mild MR/TR, mild-moderate AV sclerosis, bicuspid or functional bicuspid AV, no evidence of AS. Murmr felt due to AV.   History of blood transfusion    Hyperlipidemia    Hypertension    Pneumonia    PONV (postoperative nausea and vomiting)    Renal disorder     Past Surgical History:  Procedure Laterality Date   AV FISTULA PLACEMENT Left 05/20/2020   Procedure: INSERTION OF LEFT ARM ARTERIOVENOUS (AV) FISTULA;  Surgeon: Marty Heck, MD;  Location: [place];  Service: Vascular;  Laterality: Left;   [place] Left 08/05/2020   Procedure: SECOND STAGE BASILIC VEIN TRANSPOSITION;  Surgeon: Marty Heck, MD;  Location: [place];  Service: Vascular;  Laterality: Left;   CESAREAN SECTION     CHOLECYSTECTOMY     INSERTION OF DIALYSIS CATHETER N/A 05/20/2020   Procedure: INSERTION OF DIALYSIS CATHETER;  Surgeon: Marty Heck, MD;  Location: [facility] OR;  Service: Vascular;  Laterality: N/A;    There were no vitals filed for this visit.   Subjective Assessment - 03/18/21 1018     Subjective Doing good.     Currently in Pain? Yes    Pain Score 3     Pain Location --   knees down   Pain Orientation Right;Left                               [facility] Adult PT Treatment/Exercise - 03/18/21 0001       High Level Balance   High Level Balance Comments Side step on and off airex, side step over foam rool onto airex, on airex alt cone taps      Knee/Hip Exercises: Aerobic   Nustep level 5 x 6 minutes      Knee/Hip Exercises: Machines for Strengthening   Cybex Knee Extension 10lb 2x10    Cybex Knee Flexion 25lb 2x10    Cybex Leg Press 25# 2x10    Other Machine 5# straight arm pulls cues for posture and core activation, 10# AR pres      Knee/Hip Exercises: Standing   Walking with Sports Cord 30lb 4 way x3 each      Knee/Hip Exercises: Seated   Sit to Sand 2 sets;10 reps;without UE support   4lb chest press then OHP                      PT Short  Term Goals - 02/25/21 1015       PT SHORT TERM GOAL #1   Title Independent with initial HEP    Time 2    Period Weeks    Status Achieved    Target Date 03/05/21               PT Long Term Goals - 03/13/21 1056       PT LONG TERM GOAL #1   Title Independent with advanced HEP    Status Partially Met      PT LONG TERM GOAL #2   Title Pt able to safely amb 500 feet without scissoring or drifting.    Status Partially Met      PT LONG TERM GOAL #3   Title Pt to demonstrate improved balance by increasing SLS B to 8 sec or more    Status Partially Met                   Plan - 03/18/21 1057     Clinical Impression Statement Pt id well overall completing the interventions. Some core weakness noted with shoulder ext and AR presses. Pt has some difficulty with alt cone taps on airex. Increase resistance tolerated on machine level interventions.    Personal Factors and Comorbidities Age;Time since onset of injury/illness/exacerbation;Comorbidity 1;Comorbidity 2;Comorbidity 3+    Comorbidities  DM, arthritis, anxiety, kidney disease/ hemodialysis    Examination-Activity Limitations Lift;Reach Overhead;Carry    Examination-Participation Restrictions Other    Stability/Clinical Decision Making Stable/Uncomplicated    Rehab Potential Excellent    PT Frequency 2x / week    PT Duration 4 weeks    PT Treatment/Interventions ADLs/Self Care Home Management;Gait training;Therapeutic exercise;Therapeutic activities;Balance training;Neuromuscular re-education;Manual techniques;Patient/family education    PT Next Visit Plan work on balance and strength as well as increaesd function             Patient will benefit from skilled therapeutic intervention in order to improve the following deficits and impairments:  Abnormal gait, Decreased balance, Decreased strength, Impaired flexibility, Decreased endurance  Visit Diagnosis: Unsteadiness on feet  Other abnormalities of gait and mobility  Muscle weakness (generalized)     Problem List Patient Active Problem List   Diagnosis Date Noted   End stage renal disease on dialysis ([place]) 07/23/2020   Generalized anxiety disorder 07/23/2020   Acute renal failure superimposed on chronic kidney disease ([place]) 12/29/2019   Anemia associated with chronic renal failure 12/29/2019   Symptomatic anemia 04/08/2019   Hyperlipidemia 04/08/2019   AKI (acute kidney injury) ([place]) 04/07/2019   Hypertension associated with diabetes ([place]) 04/06/2019   Type 2 diabetes mellitus with complication, without long-term current use of insulin ([place]) 01/04/2018    Scot Jun, PTA 03/18/2021, 10:59 AM  [place]. [place], [place], [address] Phone: [phone]   Fax:  [phone]  Name: Rachael Burke MRN: [id number] Date of Birth: 1956-04-21

## 2021-03-19 DIAGNOSIS — D631 Anemia in chronic kidney disease: Secondary | ICD-10-CM | POA: Diagnosis not present

## 2021-03-19 DIAGNOSIS — R197 Diarrhea, unspecified: Secondary | ICD-10-CM | POA: Diagnosis not present

## 2021-03-19 DIAGNOSIS — N2581 Secondary hyperparathyroidism of renal origin: Secondary | ICD-10-CM | POA: Diagnosis not present

## 2021-03-19 DIAGNOSIS — N186 End stage renal disease: Secondary | ICD-10-CM | POA: Diagnosis not present

## 2021-03-19 DIAGNOSIS — R110 Nausea: Secondary | ICD-10-CM | POA: Diagnosis not present

## 2021-03-19 DIAGNOSIS — D509 Iron deficiency anemia, unspecified: Secondary | ICD-10-CM | POA: Diagnosis not present

## 2021-03-19 DIAGNOSIS — Z992 Dependence on renal dialysis: Secondary | ICD-10-CM | POA: Diagnosis not present

## 2021-03-19 DIAGNOSIS — D689 Coagulation defect, unspecified: Secondary | ICD-10-CM | POA: Diagnosis not present

## 2021-03-19 DIAGNOSIS — E1122 Type 2 diabetes mellitus with diabetic chronic kidney disease: Secondary | ICD-10-CM | POA: Diagnosis not present

## 2021-03-20 ENCOUNTER — Other Ambulatory Visit: Payer: Self-pay

## 2021-03-20 ENCOUNTER — Encounter: Payer: Self-pay | Admitting: Physical Therapy

## 2021-03-20 ENCOUNTER — Ambulatory Visit: Payer: Medicare Other | Admitting: Physical Therapy

## 2021-03-20 DIAGNOSIS — R2689 Other abnormalities of gait and mobility: Secondary | ICD-10-CM | POA: Diagnosis not present

## 2021-03-20 DIAGNOSIS — M6281 Muscle weakness (generalized): Secondary | ICD-10-CM | POA: Diagnosis not present

## 2021-03-20 DIAGNOSIS — R2681 Unsteadiness on feet: Secondary | ICD-10-CM | POA: Diagnosis not present

## 2021-03-20 DIAGNOSIS — Z9181 History of falling: Secondary | ICD-10-CM | POA: Diagnosis not present

## 2021-03-20 DIAGNOSIS — M542 Cervicalgia: Secondary | ICD-10-CM | POA: Diagnosis not present

## 2021-03-20 NOTE — Therapy (Signed)
[place]. [place], [place], [address] Phone: [phone]   Fax:  [phone]  Physical Therapy Treatment  Patient Details  Name: Rachael Burke MRN: [id number] Date of Birth: 01-04-1956 Referring Provider (PT): Raelyn Number PA   Encounter Date: 03/20/2021   PT End of Session - 03/20/21 1058     Visit Number 9    Date for PT Re-Evaluation 04/18/21    PT Start Time 6144    PT Stop Time 1058    PT Time Calculation (min) 43 min    Activity Tolerance Patient tolerated treatment well    Behavior During Therapy [employer] for tasks assessed/performed             Past Medical History:  Diagnosis Date   Anemia    Anxiety    Arthritis    Diabetes mellitus without complication ([place])    Heart murmur    echo 07/02/20: Mild MR/TR, mild-moderate AV sclerosis, bicuspid or functional bicuspid AV, no evidence of AS. Murmr felt due to AV.   History of blood transfusion    Hyperlipidemia    Hypertension    Pneumonia    PONV (postoperative nausea and vomiting)    Renal disorder     Past Surgical History:  Procedure Laterality Date   AV FISTULA PLACEMENT Left 05/20/2020   Procedure: INSERTION OF LEFT ARM ARTERIOVENOUS (AV) FISTULA;  Surgeon: Marty Heck, MD;  Location: [place];  Service: Vascular;  Laterality: Left;   [place] Left 08/05/2020   Procedure: SECOND STAGE BASILIC VEIN TRANSPOSITION;  Surgeon: Marty Heck, MD;  Location: [place];  Service: Vascular;  Laterality: Left;   CESAREAN SECTION     CHOLECYSTECTOMY     INSERTION OF DIALYSIS CATHETER N/A 05/20/2020   Procedure: INSERTION OF DIALYSIS CATHETER;  Surgeon: Marty Heck, MD;  Location: [facility] OR;  Service: Vascular;  Laterality: N/A;    There were no vitals filed for this visit.   Subjective Assessment - 03/20/21 1018     Subjective "Good"    Currently in Pain? No/denies                                [facility] Adult PT Treatment/Exercise - 03/20/21 0001       Neck Exercises: Standing   Other Standing Exercises 10lb rows 2x10      Knee/Hip Exercises: Aerobic   Nustep level 5 x 6 minutes      Knee/Hip Exercises: Machines for Strengthening   Cybex Knee Extension 10lb 2x12    Cybex Knee Flexion 25lb 2x10    Cybex Leg Press 30lb 2x12    Other Machine 10# 1x12 straight arm pulls cues for posture and core activation      Knee/Hip Exercises: Standing   Heel Raises Both;2 sets;10 reps;15 reps    Lateral Step Up Both;1 set;10 reps;Hand Hold: 0;Step Height: 6"    Forward Step Up Both;1 set;10 reps;Hand Hold: 0;Step Height: 8"      Knee/Hip Exercises: Seated   Long Arc Quad Strengthening;2 sets;10 reps   yello wball OHP, LE on airex                      PT Short Term Goals - 02/25/21 1015       PT SHORT TERM GOAL #1   Title Independent with initial HEP    Time 2  Period Weeks    Status Achieved    Target Date 03/05/21               PT Long Term Goals - 03/13/21 1056       PT LONG TERM GOAL #1   Title Independent with advanced HEP    Status Partially Met      PT LONG TERM GOAL #2   Title Pt able to safely amb 500 feet without scissoring or drifting.    Status Partially Met      PT LONG TERM GOAL #3   Title Pt to demonstrate improved balance by increasing SLS B to 8 sec or more    Status Partially Met                   Plan - 03/20/21 1059     Clinical Impression Statement pt continues to progress with her strength and function. A little difficulty noted with 8 inch step up but pt able to complete. Increase resistance and or reps tolerated with all machine level interventions. Cues to prevent posterior leaning with standing rows. Postural weakness noted with shoulder extensions.    Personal Factors and Comorbidities Age;Time since onset of injury/illness/exacerbation;Comorbidity 1;Comorbidity 2;Comorbidity  3+    Comorbidities DM, arthritis, anxiety, kidney disease/ hemodialysis    Examination-Activity Limitations Lift;Reach Overhead;Carry    Examination-Participation Restrictions Other    Stability/Clinical Decision Making Stable/Uncomplicated    Rehab Potential Excellent    PT Frequency 2x / week    PT Treatment/Interventions ADLs/Self Care Home Management;Gait training;Therapeutic exercise;Therapeutic activities;Balance training;Neuromuscular re-education;Manual techniques;Patient/family education    PT Next Visit Plan work on balance and strength as well as increaesd function             Patient will benefit from skilled therapeutic intervention in order to improve the following deficits and impairments:  Abnormal gait, Decreased balance, Decreased strength, Impaired flexibility, Decreased endurance  Visit Diagnosis: Unsteadiness on feet  Muscle weakness (generalized)  Cervicalgia  Other abnormalities of gait and mobility     Problem List Patient Active Problem List   Diagnosis Date Noted   End stage renal disease on dialysis ([place]) 07/23/2020   Generalized anxiety disorder 07/23/2020   Acute renal failure superimposed on chronic kidney disease ([place]) 12/29/2019   Anemia associated with chronic renal failure 12/29/2019   Symptomatic anemia 04/08/2019   Hyperlipidemia 04/08/2019   AKI (acute kidney injury) ([place]) 04/07/2019   Hypertension associated with diabetes ([place]) 04/06/2019   Type 2 diabetes mellitus with complication, without long-term current use of insulin ([place]) 01/04/2018    Scot Jun, PTA 03/20/2021, 11:04 AM  [place]. [place], [place], [address] Phone: [phone]   Fax:  [phone]  Name: Rachael Burke MRN: [id number] Date of Birth: 1956/04/30

## 2021-03-21 DIAGNOSIS — D631 Anemia in chronic kidney disease: Secondary | ICD-10-CM | POA: Diagnosis not present

## 2021-03-21 DIAGNOSIS — N2581 Secondary hyperparathyroidism of renal origin: Secondary | ICD-10-CM | POA: Diagnosis not present

## 2021-03-21 DIAGNOSIS — N186 End stage renal disease: Secondary | ICD-10-CM | POA: Diagnosis not present

## 2021-03-21 DIAGNOSIS — R197 Diarrhea, unspecified: Secondary | ICD-10-CM | POA: Diagnosis not present

## 2021-03-21 DIAGNOSIS — R110 Nausea: Secondary | ICD-10-CM | POA: Diagnosis not present

## 2021-03-21 DIAGNOSIS — D689 Coagulation defect, unspecified: Secondary | ICD-10-CM | POA: Diagnosis not present

## 2021-03-21 DIAGNOSIS — E1122 Type 2 diabetes mellitus with diabetic chronic kidney disease: Secondary | ICD-10-CM | POA: Diagnosis not present

## 2021-03-21 DIAGNOSIS — D509 Iron deficiency anemia, unspecified: Secondary | ICD-10-CM | POA: Diagnosis not present

## 2021-03-21 DIAGNOSIS — Z992 Dependence on renal dialysis: Secondary | ICD-10-CM | POA: Diagnosis not present

## 2021-03-24 DIAGNOSIS — R197 Diarrhea, unspecified: Secondary | ICD-10-CM | POA: Diagnosis not present

## 2021-03-24 DIAGNOSIS — R110 Nausea: Secondary | ICD-10-CM | POA: Diagnosis not present

## 2021-03-24 DIAGNOSIS — N186 End stage renal disease: Secondary | ICD-10-CM | POA: Diagnosis not present

## 2021-03-24 DIAGNOSIS — D689 Coagulation defect, unspecified: Secondary | ICD-10-CM | POA: Diagnosis not present

## 2021-03-24 DIAGNOSIS — N2581 Secondary hyperparathyroidism of renal origin: Secondary | ICD-10-CM | POA: Diagnosis not present

## 2021-03-24 DIAGNOSIS — Z992 Dependence on renal dialysis: Secondary | ICD-10-CM | POA: Diagnosis not present

## 2021-03-24 DIAGNOSIS — E1122 Type 2 diabetes mellitus with diabetic chronic kidney disease: Secondary | ICD-10-CM | POA: Diagnosis not present

## 2021-03-24 DIAGNOSIS — D631 Anemia in chronic kidney disease: Secondary | ICD-10-CM | POA: Diagnosis not present

## 2021-03-24 DIAGNOSIS — D509 Iron deficiency anemia, unspecified: Secondary | ICD-10-CM | POA: Diagnosis not present

## 2021-03-25 ENCOUNTER — Other Ambulatory Visit: Payer: Self-pay

## 2021-03-25 ENCOUNTER — Ambulatory Visit: Payer: Medicare Other | Admitting: Physical Therapy

## 2021-03-25 DIAGNOSIS — R2689 Other abnormalities of gait and mobility: Secondary | ICD-10-CM | POA: Diagnosis not present

## 2021-03-25 DIAGNOSIS — R2681 Unsteadiness on feet: Secondary | ICD-10-CM

## 2021-03-25 DIAGNOSIS — M6281 Muscle weakness (generalized): Secondary | ICD-10-CM

## 2021-03-25 DIAGNOSIS — Z9181 History of falling: Secondary | ICD-10-CM | POA: Diagnosis not present

## 2021-03-25 DIAGNOSIS — M542 Cervicalgia: Secondary | ICD-10-CM | POA: Diagnosis not present

## 2021-03-25 NOTE — Therapy (Signed)
[place]. [place], [place], [address] Phone: [phone]   Fax:  [phone]  Physical Therapy Treatment  Patient Details  Name: Rachael Burke MRN: [id number] Date of Birth: 05-13-1956 Referring Provider (PT): Raelyn Number PA   Encounter Date: 03/25/2021  Progress Note  Reporting Period 02/19/21 to 03/25/21  See note below for Objective Data and Assessment of Progress/Goals.       PT End of Session - 03/25/21 1123     Visit Number 10    Date for PT Re-Evaluation 03/25/21    Authorization Type  MEDICAID [employer]    PT Start Time 1022   arrived a few minutes late   PT Stop Time 1058    PT Time Calculation (min) 36 min    Activity Tolerance Patient tolerated treatment well    Behavior During Therapy [facility] for tasks assessed/performed             Past Medical History:  Diagnosis Date   Anemia    Anxiety    Arthritis    Diabetes mellitus without complication ([facility])    Heart murmur    echo 07/02/20: Mild MR/TR, mild-moderate AV sclerosis, bicuspid or functional bicuspid AV, no evidence of AS. Murmr felt due to AV.   History of blood transfusion    Hyperlipidemia    Hypertension    Pneumonia    PONV (postoperative nausea and vomiting)    Renal disorder     Past Surgical History:  Procedure Laterality Date   AV FISTULA PLACEMENT Left 05/20/2020   Procedure: INSERTION OF LEFT ARM ARTERIOVENOUS (AV) FISTULA;  Surgeon: Marty Heck, MD;  Location: [place];  Service: Vascular;  Laterality: Left;   [place] Left 08/05/2020   Procedure: SECOND STAGE BASILIC VEIN TRANSPOSITION;  Surgeon: Marty Heck, MD;  Location: [place];  Service: Vascular;  Laterality: Left;   CESAREAN SECTION     CHOLECYSTECTOMY     INSERTION OF DIALYSIS CATHETER N/A 05/20/2020   Procedure: INSERTION OF DIALYSIS CATHETER;  Surgeon: Marty Heck, MD;  Location[facility]  OR;  Service: Vascular;  Laterality: N/A;    There were no vitals filed for this visit.   Subjective Assessment - 03/25/21 1023     Subjective Doing good- yesterday had some pain in my shoulders but has mostly resolved itself.The hardest thing for me is when I wake up to go to the bathroom in the middle of the night and can fall, otherwise I feel fine. Doesn't fall, just feels unsteady.    Patient is accompained by: Interpreter    Pertinent History dialysis port 3d / wk MWF dialysis port in left upper arm. HTN, DM, arthritis    Currently in Pain? No/denies                [facility] Treatment PT Assessment - 03/25/21 0001       Strength   Strength Assessment Site Knee;Hip    Right/Left Knee Left;Right    Right Knee Flexion 4+/5    Right Knee Extension 5/5    Left Knee Flexion 4+/5    Left Knee Extension 5/5    Right Ankle Dorsiflexion 5/5    Left Ankle Dorsiflexion 5/5      Berg Balance Test   Sit to Stand Able to stand without using hands and stabilize independently    Standing Unsupported Able to stand safely 2 minutes    Sitting with Back Unsupported but Feet  Supported on Floor or Stool Able to sit safely and securely 2 minutes    Stand to Sit Sits safely with minimal use of hands    Transfers Able to transfer safely, minor use of hands    Standing Unsupported with Eyes Closed Able to stand 10 seconds safely    Standing Unsupported with Feet Together Able to place feet together independently and stand 1 minute safely    From Standing, Reach Forward with Outstretched Arm Can reach forward >12 cm safely (5")    From Standing Position, Pick up Object from Floor Able to pick up shoe safely and easily    From Standing Position, Turn to Look Behind Over each Shoulder Looks behind from both sides and weight shifts well    Turn 360 Degrees Able to turn 360 degrees safely in 4 seconds or less    Standing Unsupported, Alternately Place Feet on Step/Stool Able to stand independently and safely  and complete 8 steps in 20 seconds    Standing Unsupported, One Foot in Front Able to take small step independently and hold 30 seconds    Standing on One Leg Able to lift leg independently and hold equal to or more than 3 seconds    Total Score 51      Dynamic Gait Index   Level Surface Normal    Change in Gait Speed Normal    Gait with Horizontal Head Turns Normal    Gait with Vertical Head Turns Mild Impairment    Gait and Pivot Turn Normal    Step Over Obstacle Normal    Step Around Obstacles Normal    Steps Normal    Total Score 23                                    PT Education - 03/25/21 1123     Education Details DC today, recommendations for ongoing exercise and safe plan for sit to stand from floor for use at home    Person(s) Educated Patient    Methods Explanation    Comprehension Verbalized understanding              PT Short Term Goals - 03/25/21 1125       PT SHORT TERM GOAL #1   Title Independent with initial HEP    Time 2    Period Weeks    Status Achieved    Target Date 03/05/21               PT Long Term Goals - 03/25/21 1125       PT LONG TERM GOAL #1   Title Independent with advanced HEP    Time 4    Period Weeks    Status Achieved      PT LONG TERM GOAL #2   Title Pt able to safely amb 500 feet without scissoring or drifting.    Time 4    Period Weeks    Status Met      PT LONG TERM GOAL #3   Title Pt to demonstrate improved balance by increasing SLS B to 8 sec or more    Baseline 3 seconds    Period Weeks    Status Partially Met                   Plan - 03/25/21 1124     Clinical Impression Statement [name] arrives today  feeling well- reports she feels great and has no more concerns. Performed reassessment accordingly, scored well on all measures. Still not able to perform SLS for >8 seconds, but did achieve high scores on Berg and DGI, indicating low fall risk nonetheless. Has no  questions for me today. Discharging for now- educated that if any other concerns or problems arise, she may return with new referral from PCP. Thank you for the opportunity to participate in her care!    Personal Factors and Comorbidities Age;Time since onset of injury/illness/exacerbation;Comorbidity 1;Comorbidity 2;Comorbidity 3+    Comorbidities DM, arthritis, anxiety, kidney disease/ hemodialysis    Examination-Activity Limitations Lift;Reach Overhead;Carry    Examination-Participation Restrictions Other    Stability/Clinical Decision Making Stable/Uncomplicated    Clinical Decision Making Low    Rehab Potential Excellent    PT Frequency Other (comment)   DC today   PT Duration Other (comment)   DC today   PT Treatment/Interventions ADLs/Self Care Home Management;Gait training;Therapeutic exercise;Therapeutic activities;Balance training;Neuromuscular re-education;Manual techniques;Patient/family education    PT Next Visit Plan DC today    Consulted and Agree with Plan of Care Patient             Patient will benefit from skilled therapeutic intervention in order to improve the following deficits and impairments:  Abnormal gait, Decreased balance, Decreased strength, Impaired flexibility, Decreased endurance  Visit Diagnosis: Unsteadiness on feet  Muscle weakness (generalized)     Problem List Patient Active Problem List   Diagnosis Date Noted   End stage renal disease on dialysis ([place]) 07/23/2020   Generalized anxiety disorder 07/23/2020   Acute renal failure superimposed on chronic kidney disease ([place]) 12/29/2019   Anemia associated with chronic renal failure 12/29/2019   Symptomatic anemia 04/08/2019   Hyperlipidemia 04/08/2019   AKI (acute kidney injury) ([place]) 04/07/2019   Hypertension associated with diabetes ([place]) 04/06/2019   Type 2 diabetes mellitus with complication, without long-term current use of insulin ([place]) 01/04/2018    PHYSICAL THERAPY DISCHARGE  SUMMARY  Visits from Start of Care: 10  Current functional level related to goals / functional outcomes: Still having mild balance deficit- not able to maintain SLS >8 seconds, but scored well on DGI/Berg. Gait pattern much better today. Feels 100% comfortable with DC today on her end.    Remaining deficits: Mild balance impairment    Education / Equipment: Continued activity, how to get up from floor safely    Patient agrees to discharge. Patient goals were partially met. Patient is being discharged due to being pleased with the current functional level.  Windell Norfolk, DPT, PN2   Supplemental Physical Therapist [place]    Pager [phone] Acute Rehab Office [place]. [place], [place], [address] Phone: [phone]   Fax:  [phone]  Name: Katasha Riga MRN: [id number] Date of Birth: 10/18/1955

## 2021-03-26 DIAGNOSIS — R110 Nausea: Secondary | ICD-10-CM | POA: Diagnosis not present

## 2021-03-26 DIAGNOSIS — Z992 Dependence on renal dialysis: Secondary | ICD-10-CM | POA: Diagnosis not present

## 2021-03-26 DIAGNOSIS — R197 Diarrhea, unspecified: Secondary | ICD-10-CM | POA: Diagnosis not present

## 2021-03-26 DIAGNOSIS — D689 Coagulation defect, unspecified: Secondary | ICD-10-CM | POA: Diagnosis not present

## 2021-03-26 DIAGNOSIS — N2581 Secondary hyperparathyroidism of renal origin: Secondary | ICD-10-CM | POA: Diagnosis not present

## 2021-03-26 DIAGNOSIS — N186 End stage renal disease: Secondary | ICD-10-CM | POA: Diagnosis not present

## 2021-03-26 DIAGNOSIS — E1122 Type 2 diabetes mellitus with diabetic chronic kidney disease: Secondary | ICD-10-CM | POA: Diagnosis not present

## 2021-03-26 DIAGNOSIS — D631 Anemia in chronic kidney disease: Secondary | ICD-10-CM | POA: Diagnosis not present

## 2021-03-26 DIAGNOSIS — D509 Iron deficiency anemia, unspecified: Secondary | ICD-10-CM | POA: Diagnosis not present

## 2021-03-27 ENCOUNTER — Ambulatory Visit: Payer: Medicare Other | Admitting: Physical Therapy

## 2021-03-28 DIAGNOSIS — N186 End stage renal disease: Secondary | ICD-10-CM | POA: Diagnosis not present

## 2021-03-28 DIAGNOSIS — D509 Iron deficiency anemia, unspecified: Secondary | ICD-10-CM | POA: Diagnosis not present

## 2021-03-28 DIAGNOSIS — D631 Anemia in chronic kidney disease: Secondary | ICD-10-CM | POA: Diagnosis not present

## 2021-03-28 DIAGNOSIS — D689 Coagulation defect, unspecified: Secondary | ICD-10-CM | POA: Diagnosis not present

## 2021-03-28 DIAGNOSIS — R110 Nausea: Secondary | ICD-10-CM | POA: Diagnosis not present

## 2021-03-28 DIAGNOSIS — E1122 Type 2 diabetes mellitus with diabetic chronic kidney disease: Secondary | ICD-10-CM | POA: Diagnosis not present

## 2021-03-28 DIAGNOSIS — N2581 Secondary hyperparathyroidism of renal origin: Secondary | ICD-10-CM | POA: Diagnosis not present

## 2021-03-28 DIAGNOSIS — Z992 Dependence on renal dialysis: Secondary | ICD-10-CM | POA: Diagnosis not present

## 2021-03-28 DIAGNOSIS — R197 Diarrhea, unspecified: Secondary | ICD-10-CM | POA: Diagnosis not present

## 2021-03-31 DIAGNOSIS — D689 Coagulation defect, unspecified: Secondary | ICD-10-CM | POA: Diagnosis not present

## 2021-03-31 DIAGNOSIS — R197 Diarrhea, unspecified: Secondary | ICD-10-CM | POA: Diagnosis not present

## 2021-03-31 DIAGNOSIS — Z992 Dependence on renal dialysis: Secondary | ICD-10-CM | POA: Diagnosis not present

## 2021-03-31 DIAGNOSIS — D631 Anemia in chronic kidney disease: Secondary | ICD-10-CM | POA: Diagnosis not present

## 2021-03-31 DIAGNOSIS — N186 End stage renal disease: Secondary | ICD-10-CM | POA: Diagnosis not present

## 2021-03-31 DIAGNOSIS — N2581 Secondary hyperparathyroidism of renal origin: Secondary | ICD-10-CM | POA: Diagnosis not present

## 2021-03-31 DIAGNOSIS — R110 Nausea: Secondary | ICD-10-CM | POA: Diagnosis not present

## 2021-03-31 DIAGNOSIS — E1122 Type 2 diabetes mellitus with diabetic chronic kidney disease: Secondary | ICD-10-CM | POA: Diagnosis not present

## 2021-03-31 DIAGNOSIS — D509 Iron deficiency anemia, unspecified: Secondary | ICD-10-CM | POA: Diagnosis not present

## 2021-04-01 ENCOUNTER — Ambulatory Visit: Payer: Medicare Other | Admitting: Physical Therapy

## 2021-04-01 DIAGNOSIS — Z992 Dependence on renal dialysis: Secondary | ICD-10-CM | POA: Diagnosis not present

## 2021-04-01 DIAGNOSIS — E1129 Type 2 diabetes mellitus with other diabetic kidney complication: Secondary | ICD-10-CM | POA: Diagnosis not present

## 2021-04-01 DIAGNOSIS — N186 End stage renal disease: Secondary | ICD-10-CM | POA: Diagnosis not present

## 2021-04-02 DIAGNOSIS — D509 Iron deficiency anemia, unspecified: Secondary | ICD-10-CM | POA: Diagnosis not present

## 2021-04-02 DIAGNOSIS — Z7184 Encounter for health counseling related to travel: Secondary | ICD-10-CM | POA: Diagnosis not present

## 2021-04-02 DIAGNOSIS — D689 Coagulation defect, unspecified: Secondary | ICD-10-CM | POA: Diagnosis not present

## 2021-04-02 DIAGNOSIS — D631 Anemia in chronic kidney disease: Secondary | ICD-10-CM | POA: Diagnosis not present

## 2021-04-02 DIAGNOSIS — Z992 Dependence on renal dialysis: Secondary | ICD-10-CM | POA: Diagnosis not present

## 2021-04-02 DIAGNOSIS — N2581 Secondary hyperparathyroidism of renal origin: Secondary | ICD-10-CM | POA: Diagnosis not present

## 2021-04-02 DIAGNOSIS — E1122 Type 2 diabetes mellitus with diabetic chronic kidney disease: Secondary | ICD-10-CM | POA: Diagnosis not present

## 2021-04-02 DIAGNOSIS — R110 Nausea: Secondary | ICD-10-CM | POA: Diagnosis not present

## 2021-04-02 DIAGNOSIS — N186 End stage renal disease: Secondary | ICD-10-CM | POA: Diagnosis not present

## 2021-04-03 ENCOUNTER — Ambulatory Visit: Payer: Medicare Other | Admitting: Physical Therapy

## 2021-04-04 DIAGNOSIS — D689 Coagulation defect, unspecified: Secondary | ICD-10-CM | POA: Diagnosis not present

## 2021-04-04 DIAGNOSIS — N2581 Secondary hyperparathyroidism of renal origin: Secondary | ICD-10-CM | POA: Diagnosis not present

## 2021-04-04 DIAGNOSIS — D509 Iron deficiency anemia, unspecified: Secondary | ICD-10-CM | POA: Diagnosis not present

## 2021-04-04 DIAGNOSIS — Z7184 Encounter for health counseling related to travel: Secondary | ICD-10-CM | POA: Diagnosis not present

## 2021-04-04 DIAGNOSIS — D631 Anemia in chronic kidney disease: Secondary | ICD-10-CM | POA: Diagnosis not present

## 2021-04-04 DIAGNOSIS — R110 Nausea: Secondary | ICD-10-CM | POA: Diagnosis not present

## 2021-04-04 DIAGNOSIS — N186 End stage renal disease: Secondary | ICD-10-CM | POA: Diagnosis not present

## 2021-04-04 DIAGNOSIS — Z992 Dependence on renal dialysis: Secondary | ICD-10-CM | POA: Diagnosis not present

## 2021-04-04 DIAGNOSIS — E1122 Type 2 diabetes mellitus with diabetic chronic kidney disease: Secondary | ICD-10-CM | POA: Diagnosis not present

## 2021-04-07 DIAGNOSIS — D509 Iron deficiency anemia, unspecified: Secondary | ICD-10-CM | POA: Diagnosis not present

## 2021-04-07 DIAGNOSIS — Z7184 Encounter for health counseling related to travel: Secondary | ICD-10-CM | POA: Diagnosis not present

## 2021-04-07 DIAGNOSIS — D689 Coagulation defect, unspecified: Secondary | ICD-10-CM | POA: Diagnosis not present

## 2021-04-07 DIAGNOSIS — N2581 Secondary hyperparathyroidism of renal origin: Secondary | ICD-10-CM | POA: Diagnosis not present

## 2021-04-07 DIAGNOSIS — R110 Nausea: Secondary | ICD-10-CM | POA: Diagnosis not present

## 2021-04-07 DIAGNOSIS — N186 End stage renal disease: Secondary | ICD-10-CM | POA: Diagnosis not present

## 2021-04-07 DIAGNOSIS — D631 Anemia in chronic kidney disease: Secondary | ICD-10-CM | POA: Diagnosis not present

## 2021-04-07 DIAGNOSIS — Z992 Dependence on renal dialysis: Secondary | ICD-10-CM | POA: Diagnosis not present

## 2021-04-07 DIAGNOSIS — E1122 Type 2 diabetes mellitus with diabetic chronic kidney disease: Secondary | ICD-10-CM | POA: Diagnosis not present

## 2021-04-08 ENCOUNTER — Ambulatory Visit: Payer: Medicare Other | Admitting: Physical Therapy

## 2021-04-09 DIAGNOSIS — R110 Nausea: Secondary | ICD-10-CM | POA: Diagnosis not present

## 2021-04-09 DIAGNOSIS — D631 Anemia in chronic kidney disease: Secondary | ICD-10-CM | POA: Diagnosis not present

## 2021-04-09 DIAGNOSIS — Z7184 Encounter for health counseling related to travel: Secondary | ICD-10-CM | POA: Diagnosis not present

## 2021-04-09 DIAGNOSIS — N2581 Secondary hyperparathyroidism of renal origin: Secondary | ICD-10-CM | POA: Diagnosis not present

## 2021-04-09 DIAGNOSIS — N186 End stage renal disease: Secondary | ICD-10-CM | POA: Diagnosis not present

## 2021-04-09 DIAGNOSIS — Z992 Dependence on renal dialysis: Secondary | ICD-10-CM | POA: Diagnosis not present

## 2021-04-09 DIAGNOSIS — D509 Iron deficiency anemia, unspecified: Secondary | ICD-10-CM | POA: Diagnosis not present

## 2021-04-09 DIAGNOSIS — E1122 Type 2 diabetes mellitus with diabetic chronic kidney disease: Secondary | ICD-10-CM | POA: Diagnosis not present

## 2021-04-09 DIAGNOSIS — D689 Coagulation defect, unspecified: Secondary | ICD-10-CM | POA: Diagnosis not present

## 2021-04-10 ENCOUNTER — Ambulatory Visit: Payer: Medicare Other | Admitting: Physical Therapy

## 2021-04-11 DIAGNOSIS — D509 Iron deficiency anemia, unspecified: Secondary | ICD-10-CM | POA: Diagnosis not present

## 2021-04-11 DIAGNOSIS — N186 End stage renal disease: Secondary | ICD-10-CM | POA: Diagnosis not present

## 2021-04-11 DIAGNOSIS — E1122 Type 2 diabetes mellitus with diabetic chronic kidney disease: Secondary | ICD-10-CM | POA: Diagnosis not present

## 2021-04-11 DIAGNOSIS — N2581 Secondary hyperparathyroidism of renal origin: Secondary | ICD-10-CM | POA: Diagnosis not present

## 2021-04-11 DIAGNOSIS — Z992 Dependence on renal dialysis: Secondary | ICD-10-CM | POA: Diagnosis not present

## 2021-04-11 DIAGNOSIS — D689 Coagulation defect, unspecified: Secondary | ICD-10-CM | POA: Diagnosis not present

## 2021-04-11 DIAGNOSIS — Z7184 Encounter for health counseling related to travel: Secondary | ICD-10-CM | POA: Diagnosis not present

## 2021-04-11 DIAGNOSIS — R110 Nausea: Secondary | ICD-10-CM | POA: Diagnosis not present

## 2021-04-11 DIAGNOSIS — D631 Anemia in chronic kidney disease: Secondary | ICD-10-CM | POA: Diagnosis not present

## 2021-04-14 DIAGNOSIS — N186 End stage renal disease: Secondary | ICD-10-CM | POA: Diagnosis not present

## 2021-04-14 DIAGNOSIS — R110 Nausea: Secondary | ICD-10-CM | POA: Diagnosis not present

## 2021-04-14 DIAGNOSIS — D689 Coagulation defect, unspecified: Secondary | ICD-10-CM | POA: Diagnosis not present

## 2021-04-14 DIAGNOSIS — E1122 Type 2 diabetes mellitus with diabetic chronic kidney disease: Secondary | ICD-10-CM | POA: Diagnosis not present

## 2021-04-14 DIAGNOSIS — N2581 Secondary hyperparathyroidism of renal origin: Secondary | ICD-10-CM | POA: Diagnosis not present

## 2021-04-14 DIAGNOSIS — Z7184 Encounter for health counseling related to travel: Secondary | ICD-10-CM | POA: Diagnosis not present

## 2021-04-14 DIAGNOSIS — D509 Iron deficiency anemia, unspecified: Secondary | ICD-10-CM | POA: Diagnosis not present

## 2021-04-14 DIAGNOSIS — D631 Anemia in chronic kidney disease: Secondary | ICD-10-CM | POA: Diagnosis not present

## 2021-04-14 DIAGNOSIS — Z992 Dependence on renal dialysis: Secondary | ICD-10-CM | POA: Diagnosis not present

## 2021-04-16 DIAGNOSIS — Z992 Dependence on renal dialysis: Secondary | ICD-10-CM | POA: Diagnosis not present

## 2021-04-16 DIAGNOSIS — D631 Anemia in chronic kidney disease: Secondary | ICD-10-CM | POA: Diagnosis not present

## 2021-04-16 DIAGNOSIS — D509 Iron deficiency anemia, unspecified: Secondary | ICD-10-CM | POA: Diagnosis not present

## 2021-04-16 DIAGNOSIS — N2581 Secondary hyperparathyroidism of renal origin: Secondary | ICD-10-CM | POA: Diagnosis not present

## 2021-04-16 DIAGNOSIS — E1122 Type 2 diabetes mellitus with diabetic chronic kidney disease: Secondary | ICD-10-CM | POA: Diagnosis not present

## 2021-04-16 DIAGNOSIS — Z7184 Encounter for health counseling related to travel: Secondary | ICD-10-CM | POA: Diagnosis not present

## 2021-04-16 DIAGNOSIS — D689 Coagulation defect, unspecified: Secondary | ICD-10-CM | POA: Diagnosis not present

## 2021-04-16 DIAGNOSIS — R110 Nausea: Secondary | ICD-10-CM | POA: Diagnosis not present

## 2021-04-16 DIAGNOSIS — N186 End stage renal disease: Secondary | ICD-10-CM | POA: Diagnosis not present

## 2021-04-18 DIAGNOSIS — Z992 Dependence on renal dialysis: Secondary | ICD-10-CM | POA: Diagnosis not present

## 2021-04-18 DIAGNOSIS — R110 Nausea: Secondary | ICD-10-CM | POA: Diagnosis not present

## 2021-04-18 DIAGNOSIS — D689 Coagulation defect, unspecified: Secondary | ICD-10-CM | POA: Diagnosis not present

## 2021-04-18 DIAGNOSIS — D509 Iron deficiency anemia, unspecified: Secondary | ICD-10-CM | POA: Diagnosis not present

## 2021-04-18 DIAGNOSIS — E1122 Type 2 diabetes mellitus with diabetic chronic kidney disease: Secondary | ICD-10-CM | POA: Diagnosis not present

## 2021-04-18 DIAGNOSIS — Z7184 Encounter for health counseling related to travel: Secondary | ICD-10-CM | POA: Diagnosis not present

## 2021-04-18 DIAGNOSIS — N2581 Secondary hyperparathyroidism of renal origin: Secondary | ICD-10-CM | POA: Diagnosis not present

## 2021-04-18 DIAGNOSIS — N186 End stage renal disease: Secondary | ICD-10-CM | POA: Diagnosis not present

## 2021-04-18 DIAGNOSIS — D631 Anemia in chronic kidney disease: Secondary | ICD-10-CM | POA: Diagnosis not present

## 2021-04-20 DIAGNOSIS — D689 Coagulation defect, unspecified: Secondary | ICD-10-CM | POA: Diagnosis not present

## 2021-04-20 DIAGNOSIS — Z992 Dependence on renal dialysis: Secondary | ICD-10-CM | POA: Diagnosis not present

## 2021-04-20 DIAGNOSIS — N2581 Secondary hyperparathyroidism of renal origin: Secondary | ICD-10-CM | POA: Diagnosis not present

## 2021-04-20 DIAGNOSIS — E1122 Type 2 diabetes mellitus with diabetic chronic kidney disease: Secondary | ICD-10-CM | POA: Diagnosis not present

## 2021-04-20 DIAGNOSIS — D631 Anemia in chronic kidney disease: Secondary | ICD-10-CM | POA: Diagnosis not present

## 2021-04-20 DIAGNOSIS — D509 Iron deficiency anemia, unspecified: Secondary | ICD-10-CM | POA: Diagnosis not present

## 2021-04-20 DIAGNOSIS — N186 End stage renal disease: Secondary | ICD-10-CM | POA: Diagnosis not present

## 2021-04-20 DIAGNOSIS — Z7184 Encounter for health counseling related to travel: Secondary | ICD-10-CM | POA: Diagnosis not present

## 2021-04-20 DIAGNOSIS — R110 Nausea: Secondary | ICD-10-CM | POA: Diagnosis not present

## 2021-04-22 DIAGNOSIS — E1122 Type 2 diabetes mellitus with diabetic chronic kidney disease: Secondary | ICD-10-CM | POA: Diagnosis not present

## 2021-04-22 DIAGNOSIS — N2581 Secondary hyperparathyroidism of renal origin: Secondary | ICD-10-CM | POA: Diagnosis not present

## 2021-04-22 DIAGNOSIS — D509 Iron deficiency anemia, unspecified: Secondary | ICD-10-CM | POA: Diagnosis not present

## 2021-04-22 DIAGNOSIS — Z7184 Encounter for health counseling related to travel: Secondary | ICD-10-CM | POA: Diagnosis not present

## 2021-04-22 DIAGNOSIS — Z992 Dependence on renal dialysis: Secondary | ICD-10-CM | POA: Diagnosis not present

## 2021-04-22 DIAGNOSIS — N186 End stage renal disease: Secondary | ICD-10-CM | POA: Diagnosis not present

## 2021-04-22 DIAGNOSIS — R110 Nausea: Secondary | ICD-10-CM | POA: Diagnosis not present

## 2021-04-22 DIAGNOSIS — D631 Anemia in chronic kidney disease: Secondary | ICD-10-CM | POA: Diagnosis not present

## 2021-04-22 DIAGNOSIS — D689 Coagulation defect, unspecified: Secondary | ICD-10-CM | POA: Diagnosis not present

## 2021-04-25 DIAGNOSIS — D689 Coagulation defect, unspecified: Secondary | ICD-10-CM | POA: Diagnosis not present

## 2021-04-25 DIAGNOSIS — E1122 Type 2 diabetes mellitus with diabetic chronic kidney disease: Secondary | ICD-10-CM | POA: Diagnosis not present

## 2021-04-25 DIAGNOSIS — D631 Anemia in chronic kidney disease: Secondary | ICD-10-CM | POA: Diagnosis not present

## 2021-04-25 DIAGNOSIS — R110 Nausea: Secondary | ICD-10-CM | POA: Diagnosis not present

## 2021-04-25 DIAGNOSIS — N2581 Secondary hyperparathyroidism of renal origin: Secondary | ICD-10-CM | POA: Diagnosis not present

## 2021-04-25 DIAGNOSIS — Z7184 Encounter for health counseling related to travel: Secondary | ICD-10-CM | POA: Diagnosis not present

## 2021-04-25 DIAGNOSIS — N186 End stage renal disease: Secondary | ICD-10-CM | POA: Diagnosis not present

## 2021-04-25 DIAGNOSIS — Z992 Dependence on renal dialysis: Secondary | ICD-10-CM | POA: Diagnosis not present

## 2021-04-25 DIAGNOSIS — D509 Iron deficiency anemia, unspecified: Secondary | ICD-10-CM | POA: Diagnosis not present

## 2021-04-28 DIAGNOSIS — D689 Coagulation defect, unspecified: Secondary | ICD-10-CM | POA: Diagnosis not present

## 2021-04-28 DIAGNOSIS — E1122 Type 2 diabetes mellitus with diabetic chronic kidney disease: Secondary | ICD-10-CM | POA: Diagnosis not present

## 2021-04-28 DIAGNOSIS — Z992 Dependence on renal dialysis: Secondary | ICD-10-CM | POA: Diagnosis not present

## 2021-04-28 DIAGNOSIS — N186 End stage renal disease: Secondary | ICD-10-CM | POA: Diagnosis not present

## 2021-04-28 DIAGNOSIS — Z7184 Encounter for health counseling related to travel: Secondary | ICD-10-CM | POA: Diagnosis not present

## 2021-04-28 DIAGNOSIS — D631 Anemia in chronic kidney disease: Secondary | ICD-10-CM | POA: Diagnosis not present

## 2021-04-28 DIAGNOSIS — N2581 Secondary hyperparathyroidism of renal origin: Secondary | ICD-10-CM | POA: Diagnosis not present

## 2021-04-28 DIAGNOSIS — D509 Iron deficiency anemia, unspecified: Secondary | ICD-10-CM | POA: Diagnosis not present

## 2021-04-28 DIAGNOSIS — R110 Nausea: Secondary | ICD-10-CM | POA: Diagnosis not present

## 2021-04-30 DIAGNOSIS — Z7184 Encounter for health counseling related to travel: Secondary | ICD-10-CM | POA: Diagnosis not present

## 2021-04-30 DIAGNOSIS — D689 Coagulation defect, unspecified: Secondary | ICD-10-CM | POA: Diagnosis not present

## 2021-04-30 DIAGNOSIS — R110 Nausea: Secondary | ICD-10-CM | POA: Diagnosis not present

## 2021-04-30 DIAGNOSIS — D631 Anemia in chronic kidney disease: Secondary | ICD-10-CM | POA: Diagnosis not present

## 2021-04-30 DIAGNOSIS — N186 End stage renal disease: Secondary | ICD-10-CM | POA: Diagnosis not present

## 2021-04-30 DIAGNOSIS — Z992 Dependence on renal dialysis: Secondary | ICD-10-CM | POA: Diagnosis not present

## 2021-04-30 DIAGNOSIS — D509 Iron deficiency anemia, unspecified: Secondary | ICD-10-CM | POA: Diagnosis not present

## 2021-04-30 DIAGNOSIS — E1122 Type 2 diabetes mellitus with diabetic chronic kidney disease: Secondary | ICD-10-CM | POA: Diagnosis not present

## 2021-04-30 DIAGNOSIS — N2581 Secondary hyperparathyroidism of renal origin: Secondary | ICD-10-CM | POA: Diagnosis not present

## 2021-05-02 DIAGNOSIS — D631 Anemia in chronic kidney disease: Secondary | ICD-10-CM | POA: Diagnosis not present

## 2021-05-02 DIAGNOSIS — N2581 Secondary hyperparathyroidism of renal origin: Secondary | ICD-10-CM | POA: Diagnosis not present

## 2021-05-02 DIAGNOSIS — D689 Coagulation defect, unspecified: Secondary | ICD-10-CM | POA: Diagnosis not present

## 2021-05-02 DIAGNOSIS — N186 End stage renal disease: Secondary | ICD-10-CM | POA: Diagnosis not present

## 2021-05-02 DIAGNOSIS — Z992 Dependence on renal dialysis: Secondary | ICD-10-CM | POA: Diagnosis not present

## 2021-05-02 DIAGNOSIS — D509 Iron deficiency anemia, unspecified: Secondary | ICD-10-CM | POA: Diagnosis not present

## 2021-05-05 DIAGNOSIS — N2581 Secondary hyperparathyroidism of renal origin: Secondary | ICD-10-CM | POA: Diagnosis not present

## 2021-05-05 DIAGNOSIS — Z992 Dependence on renal dialysis: Secondary | ICD-10-CM | POA: Diagnosis not present

## 2021-05-05 DIAGNOSIS — D689 Coagulation defect, unspecified: Secondary | ICD-10-CM | POA: Diagnosis not present

## 2021-05-05 DIAGNOSIS — D509 Iron deficiency anemia, unspecified: Secondary | ICD-10-CM | POA: Diagnosis not present

## 2021-05-05 DIAGNOSIS — D631 Anemia in chronic kidney disease: Secondary | ICD-10-CM | POA: Diagnosis not present

## 2021-05-05 DIAGNOSIS — N186 End stage renal disease: Secondary | ICD-10-CM | POA: Diagnosis not present

## 2021-07-09 DIAGNOSIS — R110 Nausea: Secondary | ICD-10-CM | POA: Diagnosis not present

## 2021-07-09 DIAGNOSIS — D689 Coagulation defect, unspecified: Secondary | ICD-10-CM | POA: Diagnosis not present

## 2021-07-09 DIAGNOSIS — Z992 Dependence on renal dialysis: Secondary | ICD-10-CM | POA: Diagnosis not present

## 2021-07-09 DIAGNOSIS — D509 Iron deficiency anemia, unspecified: Secondary | ICD-10-CM | POA: Diagnosis not present

## 2021-07-09 DIAGNOSIS — D631 Anemia in chronic kidney disease: Secondary | ICD-10-CM | POA: Diagnosis not present

## 2021-07-09 DIAGNOSIS — N186 End stage renal disease: Secondary | ICD-10-CM | POA: Diagnosis not present

## 2021-07-09 DIAGNOSIS — E1122 Type 2 diabetes mellitus with diabetic chronic kidney disease: Secondary | ICD-10-CM | POA: Diagnosis not present

## 2021-07-09 DIAGNOSIS — N2581 Secondary hyperparathyroidism of renal origin: Secondary | ICD-10-CM | POA: Diagnosis not present

## 2021-07-11 DIAGNOSIS — D689 Coagulation defect, unspecified: Secondary | ICD-10-CM | POA: Diagnosis not present

## 2021-07-11 DIAGNOSIS — Z992 Dependence on renal dialysis: Secondary | ICD-10-CM | POA: Diagnosis not present

## 2021-07-11 DIAGNOSIS — N2581 Secondary hyperparathyroidism of renal origin: Secondary | ICD-10-CM | POA: Diagnosis not present

## 2021-07-11 DIAGNOSIS — D509 Iron deficiency anemia, unspecified: Secondary | ICD-10-CM | POA: Diagnosis not present

## 2021-07-11 DIAGNOSIS — D631 Anemia in chronic kidney disease: Secondary | ICD-10-CM | POA: Diagnosis not present

## 2021-07-11 DIAGNOSIS — R110 Nausea: Secondary | ICD-10-CM | POA: Diagnosis not present

## 2021-07-11 DIAGNOSIS — E1122 Type 2 diabetes mellitus with diabetic chronic kidney disease: Secondary | ICD-10-CM | POA: Diagnosis not present

## 2021-07-11 DIAGNOSIS — N186 End stage renal disease: Secondary | ICD-10-CM | POA: Diagnosis not present

## 2021-07-14 DIAGNOSIS — E1122 Type 2 diabetes mellitus with diabetic chronic kidney disease: Secondary | ICD-10-CM | POA: Diagnosis not present

## 2021-07-14 DIAGNOSIS — R110 Nausea: Secondary | ICD-10-CM | POA: Diagnosis not present

## 2021-07-14 DIAGNOSIS — Z992 Dependence on renal dialysis: Secondary | ICD-10-CM | POA: Diagnosis not present

## 2021-07-14 DIAGNOSIS — N186 End stage renal disease: Secondary | ICD-10-CM | POA: Diagnosis not present

## 2021-07-14 DIAGNOSIS — D631 Anemia in chronic kidney disease: Secondary | ICD-10-CM | POA: Diagnosis not present

## 2021-07-14 DIAGNOSIS — D509 Iron deficiency anemia, unspecified: Secondary | ICD-10-CM | POA: Diagnosis not present

## 2021-07-14 DIAGNOSIS — D689 Coagulation defect, unspecified: Secondary | ICD-10-CM | POA: Diagnosis not present

## 2021-07-14 DIAGNOSIS — N2581 Secondary hyperparathyroidism of renal origin: Secondary | ICD-10-CM | POA: Diagnosis not present

## 2021-07-16 DIAGNOSIS — D631 Anemia in chronic kidney disease: Secondary | ICD-10-CM | POA: Diagnosis not present

## 2021-07-16 DIAGNOSIS — E1122 Type 2 diabetes mellitus with diabetic chronic kidney disease: Secondary | ICD-10-CM | POA: Diagnosis not present

## 2021-07-16 DIAGNOSIS — N2581 Secondary hyperparathyroidism of renal origin: Secondary | ICD-10-CM | POA: Diagnosis not present

## 2021-07-16 DIAGNOSIS — R110 Nausea: Secondary | ICD-10-CM | POA: Diagnosis not present

## 2021-07-16 DIAGNOSIS — N186 End stage renal disease: Secondary | ICD-10-CM | POA: Diagnosis not present

## 2021-07-16 DIAGNOSIS — Z992 Dependence on renal dialysis: Secondary | ICD-10-CM | POA: Diagnosis not present

## 2021-07-16 DIAGNOSIS — D509 Iron deficiency anemia, unspecified: Secondary | ICD-10-CM | POA: Diagnosis not present

## 2021-07-16 DIAGNOSIS — D689 Coagulation defect, unspecified: Secondary | ICD-10-CM | POA: Diagnosis not present

## 2021-07-18 ENCOUNTER — Encounter (HOSPITAL_BASED_OUTPATIENT_CLINIC_OR_DEPARTMENT_OTHER): Payer: Self-pay

## 2021-07-18 ENCOUNTER — Emergency Department (HOSPITAL_BASED_OUTPATIENT_CLINIC_OR_DEPARTMENT_OTHER)
Admission: EM | Admit: 2021-07-18 | Discharge: 2021-07-19 | Disposition: A | Discharge status: Home / Self Care | Payer: Medicare Other | Attending: Emergency Medicine | Admitting: Emergency Medicine

## 2021-07-18 ENCOUNTER — Other Ambulatory Visit: Payer: Self-pay

## 2021-07-18 DIAGNOSIS — Z992 Dependence on renal dialysis: Secondary | ICD-10-CM | POA: Insufficient documentation

## 2021-07-18 DIAGNOSIS — N2581 Secondary hyperparathyroidism of renal origin: Secondary | ICD-10-CM | POA: Diagnosis not present

## 2021-07-18 DIAGNOSIS — E1122 Type 2 diabetes mellitus with diabetic chronic kidney disease: Secondary | ICD-10-CM | POA: Diagnosis not present

## 2021-07-18 DIAGNOSIS — D689 Coagulation defect, unspecified: Secondary | ICD-10-CM | POA: Diagnosis not present

## 2021-07-18 DIAGNOSIS — Z79899 Other long term (current) drug therapy: Secondary | ICD-10-CM | POA: Diagnosis not present

## 2021-07-18 DIAGNOSIS — N186 End stage renal disease: Secondary | ICD-10-CM | POA: Diagnosis not present

## 2021-07-18 DIAGNOSIS — D509 Iron deficiency anemia, unspecified: Secondary | ICD-10-CM | POA: Diagnosis not present

## 2021-07-18 DIAGNOSIS — R110 Nausea: Secondary | ICD-10-CM | POA: Diagnosis not present

## 2021-07-18 DIAGNOSIS — Z20822 Contact with and (suspected) exposure to covid-19: Secondary | ICD-10-CM | POA: Insufficient documentation

## 2021-07-18 DIAGNOSIS — R5383 Other fatigue: Secondary | ICD-10-CM | POA: Diagnosis present

## 2021-07-18 DIAGNOSIS — D631 Anemia in chronic kidney disease: Secondary | ICD-10-CM | POA: Diagnosis not present

## 2021-07-18 DIAGNOSIS — I120 Hypertensive chronic kidney disease with stage 5 chronic kidney disease or end stage renal disease: Secondary | ICD-10-CM | POA: Diagnosis not present

## 2021-07-18 LAB — COMPREHENSIVE METABOLIC PANEL
ALT: 10 U/L (ref 0–44)
AST: 13 U/L — ABNORMAL LOW (ref 15–41)
Albumin: 4.2 g/dL (ref 3.5–5.0)
Alkaline Phosphatase: 78 U/L (ref 38–126)
Anion gap: 8 (ref 5–15)
BUN: 9 mg/dL (ref 8–23)
CO2: 36 mmol/L — ABNORMAL HIGH (ref 22–32)
Calcium: 8.4 mg/dL — ABNORMAL LOW (ref 8.9–10.3)
Chloride: 95 mmol/L — ABNORMAL LOW (ref 98–111)
Creatinine, Ser: 3.17 mg/dL — ABNORMAL HIGH (ref 0.44–1.00)
GFR, Estimated: 16 mL/min — ABNORMAL LOW (ref >60)
Glucose, Bld: 108 mg/dL — ABNORMAL HIGH (ref 70–99)
Potassium: 4.2 mmol/L (ref 3.5–5.1)
Sodium: 139 mmol/L (ref 135–145)
Total Bilirubin: 0.3 mg/dL (ref 0.3–1.2)
Total Protein: 7.4 g/dL (ref 6.5–8.1)

## 2021-07-18 LAB — CBC
HCT: 19.6 % — ABNORMAL LOW (ref 36.0–46.0)
Hemoglobin: 6.3 g/dL — CL (ref 12.0–15.0)
MCH: 30.3 pg (ref 26.0–34.0)
MCHC: 32.1 g/dL (ref 30.0–36.0)
MCV: 94.2 fL (ref 80.0–100.0)
Platelets: 156 10*3/uL (ref 150–400)
RBC: 2.08 MIL/uL — ABNORMAL LOW (ref 3.87–5.11)
RDW: 14.2 % (ref 11.5–15.5)
WBC: 8.7 10*3/uL (ref 4.0–10.5)
nRBC: 0 % (ref 0.0–0.2)

## 2021-07-18 LAB — PREPARE RBC (CROSSMATCH)

## 2021-07-18 LAB — RESP PANEL BY RT-PCR (FLU A&B, COVID) ARPGX2
Influenza A by PCR: NEGATIVE
Influenza B by PCR: NEGATIVE
SARS Coronavirus 2 by RT PCR: NEGATIVE

## 2021-07-18 MED ORDER — SODIUM CHLORIDE 0.9 % IV SOLN: 10.0000 mL/h | Freq: Once | INTRAVENOUS | Status: DC

## 2021-07-18 NOTE — ED Provider Notes (Signed)
7:41 PM Patient transferred from [facility] for a blood transfusion.  She has been feeling generally weak in her legs according to the daughter for several weeks.  No blood in her stools.  Plan for a unit of blood and discharged home for further outpatient follow-up and work-up.  Likely this is from the kidney disease.  11:16 PM Transfusion now in process. Care to Dr. Christy Gentles with plan to d/c after transfusion   Sherwood Gambler, MD 07/18/21 2316

## 2021-07-18 NOTE — ED Triage Notes (Signed)
Pt. States that she was at the dialysis center and that her hemoglobin was 5.8. Last week it was 6.2. and hemoglobin keeps dropping.

## 2021-07-18 NOTE — ED Provider Notes (Signed)
[facility] EMERGENC[facility] DEPT Provider Note   CSN: [id number] Arrival date & time: 07/18/21  1615     History  Chief Complaint  Patient presents with   Abnormal Lab values    Rachael Burke is a 66 y.o. female.  Patient with history of end-stage renal disease, on dialysis for the past year, recently dialyzed in [place] for 2 months --dialyzes currently at [place] --presents to the emergency department for low hemoglobin.  This was noted on lab draw through dialysis.  Patient's daughter at bedside states that they were called and told to come to the emergency department today for blood transfusion.  Patient has had blood transfusion prior to initiating dialysis.  Patient denies any black or bloody stools, blood in the urine, or other bleeding.  She feels fatigued, however daughter states that patient did participate today at home cooking.  No lightheadedness or syncope.  No chest pain or shortness of breath.  She had a normal dialysis session today.  No infectious symptoms or fevers.      Home Medications Prior to Admission medications   Medication Sig Start Date End Date Taking? Authorizing Provider  amLODipine (NORVASC) 10 MG tablet Take 10 mg by mouth daily.    [provider]  atorvastatin (LIPITOR) 20 MG tablet Take 1 tablet (20 mg total) by mouth daily. 01/01/20   Charlynne Cousins, MD  diclofenac Sodium (VOLTAREN) 1 % GEL Apply 4 g topically 4 (four) times daily. Patient taking differently: Apply 4 g topically daily as needed (Pain). 06/18/20   Just, Laurita Quint, FNP  docusate sodium (COLACE) 100 MG capsule Take 1-2 capsules (100-200 mg total) by mouth 2 (two) times daily. Start with twice a day once having regular BM switch to daily Patient taking differently: Take 100 mg by mouth daily. 05/15/20   Just, Laurita Quint, FNP  escitalopram (LEXAPRO) 10 MG tablet Take 1 tablet (10 mg total) by mouth daily. 07/23/20 10/21/20  Horald Pollen, MD   furosemide (LASIX) 80 MG tablet Take 1 tablet (80 mg total) by mouth 2 (two) times daily. 01/01/20   Charlynne Cousins, MD  labetalol (NORMODYNE) 200 MG tablet Take 200 mg by mouth 2 (two) times daily.    [provider]  linagliptin (TRADJENTA) 5 MG TABS tablet Take 5 mg by mouth daily.    [provider]  ondansetron (ZOFRAN) 4 MG tablet Take 1 tablet (4 mg total) by mouth 2 (two) times daily as needed for nausea or vomiting. 08/06/20 08/06/21  Dagoberto Ligas, PA-C  oxyCODONE-acetaminophen (PERCOCET/ROXICET) 5-325 MG tablet Take 1 tablet by mouth every 6 (six) hours as needed. 08/05/20   Ulyses Amor, PA-C  oxyCODONE-acetaminophen (PERCOCET/ROXICET) 5-325 MG tablet Take 1 tablet by mouth every 6 (six) hours as needed. 08/05/20   Ulyses Amor, PA-C  polyethylene glycol powder (GLYCOLAX/MIRALAX) 17 GM/SCOOP powder Take 17 g by mouth 2 (two) times daily as needed. 05/15/20   Just, Laurita Quint, FNP  sitaGLIPtin (JANUVIA) 25 MG tablet Take 1 tablet (25 mg total) by mouth daily. 07/23/20 10/21/20  Horald Pollen, MD      Allergies    Patient has no allergy information on record.    Review of Systems   Review of Systems  Physical Exam Updated Vital Signs BP (!) 171/64    Pulse 72    Temp 99.4 F (37.4 C) (Oral)    Resp 14    Ht 5\' 2"  (1.575 m)  Wt 69.9 kg    SpO2 95%    BMI 28.19 kg/m  Physical Exam Vitals and nursing note reviewed.  Constitutional:      General: She is not in acute distress.    Appearance: She is well-developed.  HENT:     Head: Normocephalic and atraumatic.     Right Ear: External ear normal.     Left Ear: External ear normal.     Nose: Nose normal.  Eyes:     Comments: Conjunctive of eyelids moderately pale  Cardiovascular:     Rate and Rhythm: Normal rate and regular rhythm.     Heart sounds: No murmur heard. Pulmonary:     Effort: No respiratory distress.     Breath sounds: No wheezing, rhonchi or rales.  Abdominal:     Palpations:  Abdomen is soft.     Tenderness: There is no abdominal tenderness. There is no guarding or rebound.  Musculoskeletal:     Cervical back: Normal range of motion and neck supple.     Right lower leg: No edema.     Left lower leg: No edema.  Skin:    General: Skin is warm and dry.     Findings: No rash.  Neurological:     General: No focal deficit present.     Mental Status: She is alert. Mental status is at baseline.     Motor: No weakness.  Psychiatric:        Mood and Affect: Mood normal.    ED Results / Procedures / Treatments   Labs (all labs ordered are listed, but only abnormal results are displayed) Labs Reviewed  CBC - Abnormal; Notable for the following components:      Result Value   RBC 2.08 (*)    Hemoglobin 6.3 (*)    HCT 19.6 (*)    All other components within normal limits  COMPREHENSIVE METABOLIC PANEL - Abnormal; Notable for the following components:   Chloride 95 (*)    CO2 36 (*)    Glucose, Bld 108 (*)    Creatinine, Ser 3.17 (*)    Calcium 8.4 (*)    AST 13 (*)    GFR, Estimated 16 (*)    All other components within normal limits  RESP PANEL BY RT-PCR (FLU A&B, COVID) ARPGX2    EKG None  Radiology No results found.  Procedures Procedures    Medications Ordered in ED Medications - No data to display  ED Course/ Medical Decision Making/ A&P    Patient seen and examined. History obtained directly from patient. Work-up including labs, imaging, EKG ordered in triage, if performed, were reviewed.    Labs/EKG: Independently reviewed and interpreted.  This included: CBC with hemoglobin of 6.3; CMP with elevated creatinine as expected for dialysis patient, normal BUN, normal potassium; respiratory panel in progress.  Imaging: None ordered  Medications/Fluids: None ordered  Most recent vital signs reviewed and are as follows: BP (!) 171/64    Pulse 72    Temp 99.4 F (37.4 C) (Oral)    Resp 14    Ht 5\' 2"  (1.575 m)    Wt 69.9 kg    SpO2 95%     BMI 28.19 kg/m   Initial impression: Anemia of chronic disease  H&P, work-up, and plan reviewed with: Dr. Pearline Cables  Plan: Transfer by private [facility] to [facility] emergency department.  I spoken with Dr. Regenia Skeeter by secure chat who accepts patient.  Patient has IV in place and  will be sent with this after appropriately bandaged.  Patient and daughter instructed to go directly to the emergency department.  Discussed that they will need transfusion.  Patient stable.                            Medical Decision Making Amount and/or Complexity of Data Reviewed Labs: ordered.   Patient with low hemoglobin, sent by dialysis.  We do not have the ability to transfuse blood here at med [facility].  Patient will be sent to [facility] ED for transfusion.  She may be able to receive transfusion and go home, but will leave this up to ED provider at [facility].        Final Clinical Impression(s) / ED Diagnoses Final diagnoses:  Anemia due to chronic kidney disease, on chronic dialysis [facility])    Rx / DC Orders ED Discharge Orders     None         Carlisle Cater, PA-C 81/01/75 1025    Campbell Stall P, DO 85/27/78 1051

## 2021-07-18 NOTE — ED Provider Triage Note (Signed)
Emergency Medicine Provider Triage Evaluation Note  Earlena Werst , a 66 y.o. female  was evaluated in triage.  Pt complains of low hemoglobin.  Patient transferred from [facility] with hemoglobin 6.3 for blood transfusion.  She is a dialysis patient who received full course of dialysis today at [place]. Patient had blood transfusion at dialysis today.  Recently dialyzed and [place] for the past 2 months.  Denies any other associated symptoms.  Review of Systems  Positive:  Negative: See above  Physical Exam  BP (!) 171/64    Pulse 72    Temp 99.4 F (37.4 C) (Oral)    Resp 14    Ht 5\' 2"  (1.575 m)    Wt 69.9 kg    SpO2 95%    BMI 28.19 kg/m  Gen:   Awake, no distress   Resp:  Normal effort  MSK:   Moves extremities without difficulty  Other:    Medical Decision Making  Medically screening exam initiated at 6:43 PM.  Appropriate orders placed.  [name] was informed that the remainder of the evaluation will be completed by another provider, this initial triage assessment does not replace that evaluation, and the importance of remaining in the ED until their evaluation is complete.     Bud Face, PA-C 07/18/21 1846

## 2021-07-19 LAB — BPAM RBC
Blood Product Expiration Date: 202302202359
ISSUE DATE / TIME: 202302172239
Unit Type and Rh: 600

## 2021-07-19 LAB — TYPE AND SCREEN
ABO/RH(D): A POS
Antibody Screen: NEGATIVE
Unit division: 0

## 2021-07-21 DIAGNOSIS — E1122 Type 2 diabetes mellitus with diabetic chronic kidney disease: Secondary | ICD-10-CM | POA: Diagnosis not present

## 2021-07-21 DIAGNOSIS — N2581 Secondary hyperparathyroidism of renal origin: Secondary | ICD-10-CM | POA: Diagnosis not present

## 2021-07-21 DIAGNOSIS — R110 Nausea: Secondary | ICD-10-CM | POA: Diagnosis not present

## 2021-07-21 DIAGNOSIS — D509 Iron deficiency anemia, unspecified: Secondary | ICD-10-CM | POA: Diagnosis not present

## 2021-07-21 DIAGNOSIS — D631 Anemia in chronic kidney disease: Secondary | ICD-10-CM | POA: Diagnosis not present

## 2021-07-21 DIAGNOSIS — D689 Coagulation defect, unspecified: Secondary | ICD-10-CM | POA: Diagnosis not present

## 2021-07-21 DIAGNOSIS — Z992 Dependence on renal dialysis: Secondary | ICD-10-CM | POA: Diagnosis not present

## 2021-07-21 DIAGNOSIS — N186 End stage renal disease: Secondary | ICD-10-CM | POA: Diagnosis not present

## 2021-07-23 ENCOUNTER — Other Ambulatory Visit: Payer: Medicare Other | Admitting: Obstetrics and Gynecology

## 2021-07-23 ENCOUNTER — Other Ambulatory Visit: Payer: Self-pay

## 2021-07-23 DIAGNOSIS — D631 Anemia in chronic kidney disease: Secondary | ICD-10-CM | POA: Diagnosis not present

## 2021-07-23 DIAGNOSIS — Z992 Dependence on renal dialysis: Secondary | ICD-10-CM | POA: Diagnosis not present

## 2021-07-23 DIAGNOSIS — D509 Iron deficiency anemia, unspecified: Secondary | ICD-10-CM | POA: Diagnosis not present

## 2021-07-23 DIAGNOSIS — R110 Nausea: Secondary | ICD-10-CM | POA: Diagnosis not present

## 2021-07-23 DIAGNOSIS — E1122 Type 2 diabetes mellitus with diabetic chronic kidney disease: Secondary | ICD-10-CM | POA: Diagnosis not present

## 2021-07-23 DIAGNOSIS — N186 End stage renal disease: Secondary | ICD-10-CM | POA: Diagnosis not present

## 2021-07-23 DIAGNOSIS — D689 Coagulation defect, unspecified: Secondary | ICD-10-CM | POA: Diagnosis not present

## 2021-07-23 DIAGNOSIS — N2581 Secondary hyperparathyroidism of renal origin: Secondary | ICD-10-CM | POA: Diagnosis not present

## 2021-07-23 NOTE — Patient Instructions (Signed)
Visit Information  Ms. Rachael Burke  - as a part of your Medicaid benefit, you are eligible for care management and care coordination services at no cost or copay. I was unable to reach you by phone today but would be happy to help you with your health related needs. Please feel free to call me at [phone].  A member of the Managed Medicaid care management team will reach out to you again over the next 7 -14 days.   Aida Raider RN, BSN [place]   [facility] [employer] - Managed Medicaid High Risk [phone]

## 2021-07-23 NOTE — Patient Outreach (Signed)
°  Medicaid Managed Care   Unsuccessful Outreach Note  07/23/2021 Name: Rachael Burke MRN: [id number] DOB: [birth year]  An unsuccessful telephone outreach to offer case management/care coordination services was attempted today.   Plan: The care management team will reach out to the patient again over the next 7-14 days.   Aida Raider RN, BSN [place]   [facility] - Managed Medicaid High Risk [phone]

## 2021-07-25 DIAGNOSIS — D509 Iron deficiency anemia, unspecified: Secondary | ICD-10-CM | POA: Diagnosis not present

## 2021-07-25 DIAGNOSIS — N2581 Secondary hyperparathyroidism of renal origin: Secondary | ICD-10-CM | POA: Diagnosis not present

## 2021-07-25 DIAGNOSIS — D631 Anemia in chronic kidney disease: Secondary | ICD-10-CM | POA: Diagnosis not present

## 2021-07-25 DIAGNOSIS — N186 End stage renal disease: Secondary | ICD-10-CM | POA: Diagnosis not present

## 2021-07-25 DIAGNOSIS — E1122 Type 2 diabetes mellitus with diabetic chronic kidney disease: Secondary | ICD-10-CM | POA: Diagnosis not present

## 2021-07-25 DIAGNOSIS — Z992 Dependence on renal dialysis: Secondary | ICD-10-CM | POA: Diagnosis not present

## 2021-07-25 DIAGNOSIS — D689 Coagulation defect, unspecified: Secondary | ICD-10-CM | POA: Diagnosis not present

## 2021-07-25 DIAGNOSIS — R110 Nausea: Secondary | ICD-10-CM | POA: Diagnosis not present

## 2021-07-28 DIAGNOSIS — N2581 Secondary hyperparathyroidism of renal origin: Secondary | ICD-10-CM | POA: Diagnosis not present

## 2021-07-28 DIAGNOSIS — D689 Coagulation defect, unspecified: Secondary | ICD-10-CM | POA: Diagnosis not present

## 2021-07-28 DIAGNOSIS — D509 Iron deficiency anemia, unspecified: Secondary | ICD-10-CM | POA: Diagnosis not present

## 2021-07-28 DIAGNOSIS — E1122 Type 2 diabetes mellitus with diabetic chronic kidney disease: Secondary | ICD-10-CM | POA: Diagnosis not present

## 2021-07-28 DIAGNOSIS — D631 Anemia in chronic kidney disease: Secondary | ICD-10-CM | POA: Diagnosis not present

## 2021-07-28 DIAGNOSIS — Z992 Dependence on renal dialysis: Secondary | ICD-10-CM | POA: Diagnosis not present

## 2021-07-28 DIAGNOSIS — R110 Nausea: Secondary | ICD-10-CM | POA: Diagnosis not present

## 2021-07-28 DIAGNOSIS — N186 End stage renal disease: Secondary | ICD-10-CM | POA: Diagnosis not present

## 2021-07-29 DIAGNOSIS — N186 End stage renal disease: Secondary | ICD-10-CM | POA: Diagnosis not present

## 2021-07-29 DIAGNOSIS — Z992 Dependence on renal dialysis: Secondary | ICD-10-CM | POA: Diagnosis not present

## 2021-07-29 DIAGNOSIS — E1129 Type 2 diabetes mellitus with other diabetic kidney complication: Secondary | ICD-10-CM | POA: Diagnosis not present

## 2021-07-30 DIAGNOSIS — E1122 Type 2 diabetes mellitus with diabetic chronic kidney disease: Secondary | ICD-10-CM | POA: Diagnosis not present

## 2021-07-30 DIAGNOSIS — D631 Anemia in chronic kidney disease: Secondary | ICD-10-CM | POA: Diagnosis not present

## 2021-07-30 DIAGNOSIS — D509 Iron deficiency anemia, unspecified: Secondary | ICD-10-CM | POA: Diagnosis not present

## 2021-07-30 DIAGNOSIS — N2581 Secondary hyperparathyroidism of renal origin: Secondary | ICD-10-CM | POA: Diagnosis not present

## 2021-07-30 DIAGNOSIS — Z7184 Encounter for health counseling related to travel: Secondary | ICD-10-CM | POA: Diagnosis not present

## 2021-07-30 DIAGNOSIS — N186 End stage renal disease: Secondary | ICD-10-CM | POA: Diagnosis not present

## 2021-07-30 DIAGNOSIS — Z992 Dependence on renal dialysis: Secondary | ICD-10-CM | POA: Diagnosis not present

## 2021-07-30 DIAGNOSIS — D689 Coagulation defect, unspecified: Secondary | ICD-10-CM | POA: Diagnosis not present

## 2021-07-31 ENCOUNTER — Ambulatory Visit (INDEPENDENT_AMBULATORY_CARE_PROVIDER_SITE_OTHER): Payer: Self-pay | Admitting: Plastic Surgery

## 2021-07-31 ENCOUNTER — Encounter: Payer: Self-pay | Admitting: Plastic Surgery

## 2021-07-31 ENCOUNTER — Other Ambulatory Visit: Payer: Self-pay

## 2021-07-31 VITALS — BP 169/87 | HR 70

## 2021-07-31 DIAGNOSIS — Z411 Encounter for cosmetic surgery: Secondary | ICD-10-CM

## 2021-07-31 NOTE — Progress Notes (Signed)
Patient presents to discuss aesthetic concerns regarding her face.  She is here with her daughter who is translating for her.  She is bothered by the excess skin beneath her eyes and the lines on her forehead specifically.  On exam she has static and dynamic lines in the forehead, glabella and crows feet.  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  They did ask about neuromodulator treatment.  I explained that neuromodulator treatment would be reasonable but too much would cause brow ptosis and exacerbation of her upper eyelid dermatochalasis.  We also discussed laser resurfacing which she is interested in.  She wanted to go forward with a small amount of Botox to see if that would help.  We reviewed the risks and benefits of Botox treatment.  28 units of Botox was drawn up and injected in the upper forehead in a straight line and in a standard injection pattern for the glabella.  She tolerated this fine.  We will give her some information on the halo laser.  All of their questions were answered. ?

## 2021-08-01 DIAGNOSIS — Z7184 Encounter for health counseling related to travel: Secondary | ICD-10-CM | POA: Diagnosis not present

## 2021-08-01 DIAGNOSIS — N186 End stage renal disease: Secondary | ICD-10-CM | POA: Diagnosis not present

## 2021-08-01 DIAGNOSIS — N2581 Secondary hyperparathyroidism of renal origin: Secondary | ICD-10-CM | POA: Diagnosis not present

## 2021-08-01 DIAGNOSIS — E1122 Type 2 diabetes mellitus with diabetic chronic kidney disease: Secondary | ICD-10-CM | POA: Diagnosis not present

## 2021-08-01 DIAGNOSIS — D631 Anemia in chronic kidney disease: Secondary | ICD-10-CM | POA: Diagnosis not present

## 2021-08-01 DIAGNOSIS — D689 Coagulation defect, unspecified: Secondary | ICD-10-CM | POA: Diagnosis not present

## 2021-08-01 DIAGNOSIS — Z992 Dependence on renal dialysis: Secondary | ICD-10-CM | POA: Diagnosis not present

## 2021-08-01 DIAGNOSIS — D509 Iron deficiency anemia, unspecified: Secondary | ICD-10-CM | POA: Diagnosis not present

## 2021-08-04 DIAGNOSIS — Z7184 Encounter for health counseling related to travel: Secondary | ICD-10-CM | POA: Diagnosis not present

## 2021-08-04 DIAGNOSIS — D631 Anemia in chronic kidney disease: Secondary | ICD-10-CM | POA: Diagnosis not present

## 2021-08-04 DIAGNOSIS — N186 End stage renal disease: Secondary | ICD-10-CM | POA: Diagnosis not present

## 2021-08-04 DIAGNOSIS — E1122 Type 2 diabetes mellitus with diabetic chronic kidney disease: Secondary | ICD-10-CM | POA: Diagnosis not present

## 2021-08-04 DIAGNOSIS — D689 Coagulation defect, unspecified: Secondary | ICD-10-CM | POA: Diagnosis not present

## 2021-08-04 DIAGNOSIS — N2581 Secondary hyperparathyroidism of renal origin: Secondary | ICD-10-CM | POA: Diagnosis not present

## 2021-08-04 DIAGNOSIS — D509 Iron deficiency anemia, unspecified: Secondary | ICD-10-CM | POA: Diagnosis not present

## 2021-08-04 DIAGNOSIS — Z992 Dependence on renal dialysis: Secondary | ICD-10-CM | POA: Diagnosis not present

## 2021-08-11 ENCOUNTER — Ambulatory Visit: Payer: Medicare Other

## 2021-08-20 DIAGNOSIS — Z7184 Encounter for health counseling related to travel: Secondary | ICD-10-CM | POA: Diagnosis not present

## 2021-08-20 DIAGNOSIS — D689 Coagulation defect, unspecified: Secondary | ICD-10-CM | POA: Diagnosis not present

## 2021-08-20 DIAGNOSIS — E1122 Type 2 diabetes mellitus with diabetic chronic kidney disease: Secondary | ICD-10-CM | POA: Diagnosis not present

## 2021-08-20 DIAGNOSIS — N2581 Secondary hyperparathyroidism of renal origin: Secondary | ICD-10-CM | POA: Diagnosis not present

## 2021-08-20 DIAGNOSIS — D631 Anemia in chronic kidney disease: Secondary | ICD-10-CM | POA: Diagnosis not present

## 2021-08-20 DIAGNOSIS — N186 End stage renal disease: Secondary | ICD-10-CM | POA: Diagnosis not present

## 2021-08-20 DIAGNOSIS — Z992 Dependence on renal dialysis: Secondary | ICD-10-CM | POA: Diagnosis not present

## 2021-08-20 DIAGNOSIS — D509 Iron deficiency anemia, unspecified: Secondary | ICD-10-CM | POA: Diagnosis not present

## 2021-08-22 DIAGNOSIS — N186 End stage renal disease: Secondary | ICD-10-CM | POA: Diagnosis not present

## 2021-08-22 DIAGNOSIS — E782 Mixed hyperlipidemia: Secondary | ICD-10-CM | POA: Diagnosis not present

## 2021-08-22 DIAGNOSIS — Z7184 Encounter for health counseling related to travel: Secondary | ICD-10-CM | POA: Diagnosis not present

## 2021-08-22 DIAGNOSIS — R110 Nausea: Secondary | ICD-10-CM | POA: Diagnosis not present

## 2021-08-22 DIAGNOSIS — N2581 Secondary hyperparathyroidism of renal origin: Secondary | ICD-10-CM | POA: Diagnosis not present

## 2021-08-22 DIAGNOSIS — H1013 Acute atopic conjunctivitis, bilateral: Secondary | ICD-10-CM | POA: Diagnosis not present

## 2021-08-22 DIAGNOSIS — Z992 Dependence on renal dialysis: Secondary | ICD-10-CM | POA: Diagnosis not present

## 2021-08-22 DIAGNOSIS — R269 Unspecified abnormalities of gait and mobility: Secondary | ICD-10-CM | POA: Diagnosis not present

## 2021-08-22 DIAGNOSIS — I10 Essential (primary) hypertension: Secondary | ICD-10-CM | POA: Diagnosis not present

## 2021-08-22 DIAGNOSIS — K219 Gastro-esophageal reflux disease without esophagitis: Secondary | ICD-10-CM | POA: Diagnosis not present

## 2021-08-22 DIAGNOSIS — K5909 Other constipation: Secondary | ICD-10-CM | POA: Diagnosis not present

## 2021-08-22 DIAGNOSIS — D509 Iron deficiency anemia, unspecified: Secondary | ICD-10-CM | POA: Diagnosis not present

## 2021-08-22 DIAGNOSIS — D631 Anemia in chronic kidney disease: Secondary | ICD-10-CM | POA: Diagnosis not present

## 2021-08-22 DIAGNOSIS — R531 Weakness: Secondary | ICD-10-CM | POA: Diagnosis not present

## 2021-08-22 DIAGNOSIS — E1122 Type 2 diabetes mellitus with diabetic chronic kidney disease: Secondary | ICD-10-CM | POA: Diagnosis not present

## 2021-08-22 DIAGNOSIS — R42 Dizziness and giddiness: Secondary | ICD-10-CM | POA: Diagnosis not present

## 2021-08-22 DIAGNOSIS — D689 Coagulation defect, unspecified: Secondary | ICD-10-CM | POA: Diagnosis not present

## 2021-08-25 DIAGNOSIS — D689 Coagulation defect, unspecified: Secondary | ICD-10-CM | POA: Diagnosis not present

## 2021-08-25 DIAGNOSIS — N186 End stage renal disease: Secondary | ICD-10-CM | POA: Diagnosis not present

## 2021-08-25 DIAGNOSIS — Z992 Dependence on renal dialysis: Secondary | ICD-10-CM | POA: Diagnosis not present

## 2021-08-25 DIAGNOSIS — Z7184 Encounter for health counseling related to travel: Secondary | ICD-10-CM | POA: Diagnosis not present

## 2021-08-25 DIAGNOSIS — D509 Iron deficiency anemia, unspecified: Secondary | ICD-10-CM | POA: Diagnosis not present

## 2021-08-25 DIAGNOSIS — N2581 Secondary hyperparathyroidism of renal origin: Secondary | ICD-10-CM | POA: Diagnosis not present

## 2021-08-25 DIAGNOSIS — D631 Anemia in chronic kidney disease: Secondary | ICD-10-CM | POA: Diagnosis not present

## 2021-08-25 DIAGNOSIS — E1122 Type 2 diabetes mellitus with diabetic chronic kidney disease: Secondary | ICD-10-CM | POA: Diagnosis not present

## 2021-08-27 DIAGNOSIS — E1122 Type 2 diabetes mellitus with diabetic chronic kidney disease: Secondary | ICD-10-CM | POA: Diagnosis not present

## 2021-08-27 DIAGNOSIS — D689 Coagulation defect, unspecified: Secondary | ICD-10-CM | POA: Diagnosis not present

## 2021-08-27 DIAGNOSIS — D509 Iron deficiency anemia, unspecified: Secondary | ICD-10-CM | POA: Diagnosis not present

## 2021-08-27 DIAGNOSIS — D631 Anemia in chronic kidney disease: Secondary | ICD-10-CM | POA: Diagnosis not present

## 2021-08-27 DIAGNOSIS — N2581 Secondary hyperparathyroidism of renal origin: Secondary | ICD-10-CM | POA: Diagnosis not present

## 2021-08-27 DIAGNOSIS — Z992 Dependence on renal dialysis: Secondary | ICD-10-CM | POA: Diagnosis not present

## 2021-08-27 DIAGNOSIS — N186 End stage renal disease: Secondary | ICD-10-CM | POA: Diagnosis not present

## 2021-08-27 DIAGNOSIS — Z7184 Encounter for health counseling related to travel: Secondary | ICD-10-CM | POA: Diagnosis not present

## 2021-08-29 DIAGNOSIS — D509 Iron deficiency anemia, unspecified: Secondary | ICD-10-CM | POA: Diagnosis not present

## 2021-08-29 DIAGNOSIS — N186 End stage renal disease: Secondary | ICD-10-CM | POA: Diagnosis not present

## 2021-08-29 DIAGNOSIS — D631 Anemia in chronic kidney disease: Secondary | ICD-10-CM | POA: Diagnosis not present

## 2021-08-29 DIAGNOSIS — E1122 Type 2 diabetes mellitus with diabetic chronic kidney disease: Secondary | ICD-10-CM | POA: Diagnosis not present

## 2021-08-29 DIAGNOSIS — Z7184 Encounter for health counseling related to travel: Secondary | ICD-10-CM | POA: Diagnosis not present

## 2021-08-29 DIAGNOSIS — E1129 Type 2 diabetes mellitus with other diabetic kidney complication: Secondary | ICD-10-CM | POA: Diagnosis not present

## 2021-08-29 DIAGNOSIS — Z992 Dependence on renal dialysis: Secondary | ICD-10-CM | POA: Diagnosis not present

## 2021-08-29 DIAGNOSIS — N2581 Secondary hyperparathyroidism of renal origin: Secondary | ICD-10-CM | POA: Diagnosis not present

## 2021-08-29 DIAGNOSIS — D689 Coagulation defect, unspecified: Secondary | ICD-10-CM | POA: Diagnosis not present

## 2021-09-01 DIAGNOSIS — Z992 Dependence on renal dialysis: Secondary | ICD-10-CM | POA: Diagnosis not present

## 2021-09-01 DIAGNOSIS — E1122 Type 2 diabetes mellitus with diabetic chronic kidney disease: Secondary | ICD-10-CM | POA: Diagnosis not present

## 2021-09-01 DIAGNOSIS — R110 Nausea: Secondary | ICD-10-CM | POA: Diagnosis not present

## 2021-09-01 DIAGNOSIS — D509 Iron deficiency anemia, unspecified: Secondary | ICD-10-CM | POA: Diagnosis not present

## 2021-09-01 DIAGNOSIS — N186 End stage renal disease: Secondary | ICD-10-CM | POA: Diagnosis not present

## 2021-09-01 DIAGNOSIS — D631 Anemia in chronic kidney disease: Secondary | ICD-10-CM | POA: Diagnosis not present

## 2021-09-01 DIAGNOSIS — N2581 Secondary hyperparathyroidism of renal origin: Secondary | ICD-10-CM | POA: Diagnosis not present

## 2021-09-01 DIAGNOSIS — D689 Coagulation defect, unspecified: Secondary | ICD-10-CM | POA: Diagnosis not present

## 2021-09-03 DIAGNOSIS — R110 Nausea: Secondary | ICD-10-CM | POA: Diagnosis not present

## 2021-09-03 DIAGNOSIS — K5909 Other constipation: Secondary | ICD-10-CM | POA: Diagnosis not present

## 2021-09-03 DIAGNOSIS — D689 Coagulation defect, unspecified: Secondary | ICD-10-CM | POA: Diagnosis not present

## 2021-09-03 DIAGNOSIS — D631 Anemia in chronic kidney disease: Secondary | ICD-10-CM | POA: Diagnosis not present

## 2021-09-03 DIAGNOSIS — E782 Mixed hyperlipidemia: Secondary | ICD-10-CM | POA: Diagnosis not present

## 2021-09-03 DIAGNOSIS — D509 Iron deficiency anemia, unspecified: Secondary | ICD-10-CM | POA: Diagnosis not present

## 2021-09-03 DIAGNOSIS — Z992 Dependence on renal dialysis: Secondary | ICD-10-CM | POA: Diagnosis not present

## 2021-09-03 DIAGNOSIS — N2581 Secondary hyperparathyroidism of renal origin: Secondary | ICD-10-CM | POA: Diagnosis not present

## 2021-09-03 DIAGNOSIS — R531 Weakness: Secondary | ICD-10-CM | POA: Diagnosis not present

## 2021-09-03 DIAGNOSIS — R269 Unspecified abnormalities of gait and mobility: Secondary | ICD-10-CM | POA: Diagnosis not present

## 2021-09-03 DIAGNOSIS — K219 Gastro-esophageal reflux disease without esophagitis: Secondary | ICD-10-CM | POA: Diagnosis not present

## 2021-09-03 DIAGNOSIS — E1122 Type 2 diabetes mellitus with diabetic chronic kidney disease: Secondary | ICD-10-CM | POA: Diagnosis not present

## 2021-09-03 DIAGNOSIS — N186 End stage renal disease: Secondary | ICD-10-CM | POA: Diagnosis not present

## 2021-09-03 DIAGNOSIS — R42 Dizziness and giddiness: Secondary | ICD-10-CM | POA: Diagnosis not present

## 2021-09-03 DIAGNOSIS — I10 Essential (primary) hypertension: Secondary | ICD-10-CM | POA: Diagnosis not present

## 2021-09-05 ENCOUNTER — Emergency Department (HOSPITAL_COMMUNITY): Payer: Medicare Other

## 2021-09-05 ENCOUNTER — Emergency Department (HOSPITAL_COMMUNITY)
Admission: EM | Admit: 2021-09-05 | Discharge: 2021-09-05 | Disposition: A | Discharge status: Home / Self Care | Payer: Medicare Other | Attending: Emergency Medicine | Admitting: Emergency Medicine

## 2021-09-05 ENCOUNTER — Encounter (HOSPITAL_COMMUNITY): Payer: Self-pay | Admitting: Emergency Medicine

## 2021-09-05 ENCOUNTER — Encounter (HOSPITAL_COMMUNITY): Payer: Self-pay

## 2021-09-05 ENCOUNTER — Ambulatory Visit (HOSPITAL_COMMUNITY)
Admission: EM | Admit: 2021-09-05 | Discharge: 2021-09-05 | Disposition: A | Discharge status: Home / Self Care | Payer: Medicare Other | Attending: Internal Medicine | Admitting: Internal Medicine

## 2021-09-05 ENCOUNTER — Other Ambulatory Visit: Payer: Self-pay

## 2021-09-05 DIAGNOSIS — N186 End stage renal disease: Secondary | ICD-10-CM | POA: Insufficient documentation

## 2021-09-05 DIAGNOSIS — R112 Nausea with vomiting, unspecified: Secondary | ICD-10-CM | POA: Diagnosis not present

## 2021-09-05 DIAGNOSIS — Z79899 Other long term (current) drug therapy: Secondary | ICD-10-CM | POA: Insufficient documentation

## 2021-09-05 DIAGNOSIS — R1084 Generalized abdominal pain: Secondary | ICD-10-CM

## 2021-09-05 DIAGNOSIS — R3911 Hesitancy of micturition: Secondary | ICD-10-CM | POA: Diagnosis not present

## 2021-09-05 DIAGNOSIS — R109 Unspecified abdominal pain: Secondary | ICD-10-CM | POA: Diagnosis not present

## 2021-09-05 DIAGNOSIS — Z992 Dependence on renal dialysis: Secondary | ICD-10-CM | POA: Diagnosis not present

## 2021-09-05 DIAGNOSIS — Z7984 Long term (current) use of oral hypoglycemic drugs: Secondary | ICD-10-CM | POA: Diagnosis not present

## 2021-09-05 DIAGNOSIS — R110 Nausea: Secondary | ICD-10-CM | POA: Insufficient documentation

## 2021-09-05 DIAGNOSIS — E1122 Type 2 diabetes mellitus with diabetic chronic kidney disease: Secondary | ICD-10-CM | POA: Diagnosis not present

## 2021-09-05 DIAGNOSIS — M545 Low back pain, unspecified: Secondary | ICD-10-CM | POA: Diagnosis not present

## 2021-09-05 DIAGNOSIS — D631 Anemia in chronic kidney disease: Secondary | ICD-10-CM | POA: Diagnosis not present

## 2021-09-05 DIAGNOSIS — D509 Iron deficiency anemia, unspecified: Secondary | ICD-10-CM | POA: Diagnosis not present

## 2021-09-05 DIAGNOSIS — I120 Hypertensive chronic kidney disease with stage 5 chronic kidney disease or end stage renal disease: Secondary | ICD-10-CM | POA: Insufficient documentation

## 2021-09-05 DIAGNOSIS — N2581 Secondary hyperparathyroidism of renal origin: Secondary | ICD-10-CM | POA: Diagnosis not present

## 2021-09-05 DIAGNOSIS — D689 Coagulation defect, unspecified: Secondary | ICD-10-CM | POA: Diagnosis not present

## 2021-09-05 LAB — URINALYSIS, ROUTINE W REFLEX MICROSCOPIC
Bilirubin Urine: NEGATIVE
Glucose, UA: 50 mg/dL — AB
Hgb urine dipstick: NEGATIVE
Ketones, ur: NEGATIVE mg/dL
Leukocytes,Ua: NEGATIVE
Nitrite: NEGATIVE
Protein, ur: >=300 mg/dL — AB
Specific Gravity, Urine: 1.011 (ref 1.005–1.030)
pH: 9 — ABNORMAL HIGH (ref 5.0–8.0)

## 2021-09-05 LAB — LIPASE, BLOOD: Lipase: 43 U/L (ref 11–51)

## 2021-09-05 LAB — CBC
HCT: 24.6 % — ABNORMAL LOW (ref 36.0–46.0)
Hemoglobin: 7.9 g/dL — ABNORMAL LOW (ref 12.0–15.0)
MCH: 28.7 pg (ref 26.0–34.0)
MCHC: 32.1 g/dL (ref 30.0–36.0)
MCV: 89.5 fL (ref 80.0–100.0)
Platelets: 170 10*3/uL (ref 150–400)
RBC: 2.75 MIL/uL — ABNORMAL LOW (ref 3.87–5.11)
RDW: 14.7 % (ref 11.5–15.5)
WBC: 10.5 10*3/uL (ref 4.0–10.5)
nRBC: 0 % (ref 0.0–0.2)

## 2021-09-05 LAB — COMPREHENSIVE METABOLIC PANEL
ALT: 11 U/L (ref 0–44)
AST: 17 U/L (ref 15–41)
Albumin: 3.7 g/dL (ref 3.5–5.0)
Alkaline Phosphatase: 87 U/L (ref 38–126)
Anion gap: 10 (ref 5–15)
BUN: 24 mg/dL — ABNORMAL HIGH (ref 8–23)
CO2: 30 mmol/L (ref 22–32)
Calcium: 8.8 mg/dL — ABNORMAL LOW (ref 8.9–10.3)
Chloride: 98 mmol/L (ref 98–111)
Creatinine, Ser: 5.99 mg/dL — ABNORMAL HIGH (ref 0.44–1.00)
GFR, Estimated: 7 mL/min — ABNORMAL LOW (ref >60)
Glucose, Bld: 117 mg/dL — ABNORMAL HIGH (ref 70–99)
Potassium: 4 mmol/L (ref 3.5–5.1)
Sodium: 138 mmol/L (ref 135–145)
Total Bilirubin: 0.8 mg/dL (ref 0.3–1.2)
Total Protein: 7.6 g/dL (ref 6.5–8.1)

## 2021-09-05 LAB — POCT URINALYSIS DIPSTICK, ED / UC
Bilirubin Urine: NEGATIVE
Glucose, UA: NEGATIVE mg/dL
Ketones, ur: NEGATIVE mg/dL
Nitrite: NEGATIVE
Protein, ur: >=300 mg/dL — AB
Specific Gravity, Urine: 1.02 (ref 1.005–1.030)
Urobilinogen, UA: 0.2 mg/dL (ref 0.0–1.0)
pH: 8.5 — ABNORMAL HIGH (ref 5.0–8.0)

## 2021-09-05 MED ORDER — ACETAMINOPHEN 325 MG PO TABS
650.0000 mg | ORAL_TABLET | Freq: Once | ORAL | Status: AC
  Administered 2021-09-05: 650 mg via ORAL
  Filled 2021-09-05: qty 2

## 2021-09-05 MED ORDER — ONDANSETRON 4 MG PO TBDP
4.0000 mg | ORAL_TABLET | Freq: Once | ORAL | Status: AC
  Administered 2021-09-05: 4 mg via ORAL
  Filled 2021-09-05: qty 1

## 2021-09-05 MED ORDER — ACETAMINOPHEN 325 MG PO TABS
650.0000 mg | ORAL_TABLET | Freq: Four times a day (QID) | ORAL | 0 refills | Status: AC | PRN
Start: 2021-09-05 — End: ?

## 2021-09-05 MED ORDER — ONDANSETRON 4 MG PO TBDP: 4.0000 mg | ORAL_TABLET | Freq: Three times a day (TID) | ORAL | 0 refills | Status: DC | PRN

## 2021-09-05 NOTE — ED Notes (Signed)
Pt given water for PO challenge 

## 2021-09-05 NOTE — ED Notes (Signed)
Pt pulse ox 95% on RA walking back and forth down hall app 136ft. ?

## 2021-09-05 NOTE — ED Notes (Signed)
Patient is being discharged from the Urgent Care and sent to the Emergency Department via POV. Per Hildred Alamin FNP, patient is in need of higher level of care due to flank pain. Patient is aware and verbalizes understanding of plan of care.  ?Vitals:  ? 09/05/21 0835 09/05/21 0836  ?BP:    ?Pulse:    ?Resp:    ?Temp:    ?SpO2: 91% 96%  ?  ?

## 2021-09-05 NOTE — ED Triage Notes (Signed)
Pt c/o lt flank pain since yesterday. States urinating in small amounts. Pt goes to dialysis 3 x a wk. States saw her PCP on Wednesday for lower abdominal pain.  ?

## 2021-09-05 NOTE — ED Provider Notes (Signed)
?[facility] ?Provider Note ? ? ?CSN: [id number] ?Arrival date & time: 09/05/21  0909 ? ?  ? ?History ? ?Chief Complaint  ?Patient presents with  ? Abdominal Pain  ? ? ?Rachael Burke is a 66 y.o. female. ? ?Pt presents today for severe nausea, weakness, right sided back pain and the inability to urinate since Saturday 08/31/21. Pt's PMH include HTN, ESRD with dialysis 3 days a week, and Type 2 DM. Pt states that Saturday morning, she woke up and was extremely nauseous and had to vomit. Since that incident, she has been extremely nauseous with right sided lower back pain but has not thrown up again. She states that she has been experiencing trouble urinating and having bowel movements since Saturday. Pt's daughter states that she had a similar episode in 2020 and was admitted in the hospital for over a week and discharged without a diagnosis. Pt's daughter states that the pt's last colonscopy was in 2020 during the hospital stay. Pt has tried a solution of an unknown medication from [place] to relieve her bowel without relief. Denies fever, weight loss, bloody vomiting, diarrhea, SOB, or chest pain.  ? ? ?  ? ?Home Medications ?Prior to Admission medications   ?Medication Sig Start Date End Date Taking? Authorizing Provider  ?amLODipine (NORVASC) 10 MG tablet Take 10 mg by mouth daily.    [provider]  ?atorvastatin (LIPITOR) 20 MG tablet Take 1 tablet (20 mg total) by mouth daily. 01/01/20   Charlynne Cousins, MD  ?diclofenac Sodium (VOLTAREN) 1 % GEL Apply 4 g topically 4 (four) times daily. ?Patient taking differently: Apply 4 g topically daily as needed (Pain). 06/18/20   Just, Laurita Quint, FNP  ?docusate sodium (COLACE) 100 MG capsule Take 1-2 capsules (100-200 mg total) by mouth 2 (two) times daily. Start with twice a day once having regular BM switch to daily ?Patient taking differently: Take 100 mg by mouth daily. 05/15/20   Just, Laurita Quint, FNP   ?escitalopram (LEXAPRO) 10 MG tablet Take 1 tablet (10 mg total) by mouth daily. 07/23/20 10/21/20  Horald Pollen, MD  ?furosemide (LASIX) 80 MG tablet Take 1 tablet (80 mg total) by mouth 2 (two) times daily. 01/01/20   Charlynne Cousins, MD  ?labetalol (NORMODYNE) 200 MG tablet Take 200 mg by mouth 2 (two) times daily.    [provider]  ?linagliptin (TRADJENTA) 5 MG TABS tablet Take 5 mg by mouth daily.    [provider]  ?oxyCODONE-acetaminophen (PERCOCET/ROXICET) 5-325 MG tablet Take 1 tablet by mouth every 6 (six) hours as needed. 08/05/20   Ulyses Amor, PA-C  ?oxyCODONE-acetaminophen (PERCOCET/ROXICET) 5-325 MG tablet Take 1 tablet by mouth every 6 (six) hours as needed. 08/05/20   Ulyses Amor, PA-C  ?polyethylene glycol powder (GLYCOLAX/MIRALAX) 17 GM/SCOOP powder Take 17 g by mouth 2 (two) times daily as needed. 05/15/20   Just, Laurita Quint, FNP  ?sitaGLIPtin (JANUVIA) 25 MG tablet Take 1 tablet (25 mg total) by mouth daily. 07/23/20 10/21/20  Horald Pollen, MD  ?   ? ?Allergies    ?Patient has no known allergies.   ? ?Review of Systems   ?Review of Systems ? ?Physical Exam ?Updated Vital Signs ?BP (!) 165/62   Pulse 70   Temp 98.7 ?F (37.1 ?C) (Oral)   Resp 18   SpO2 96%  ? ?Physical Exam ?Vitals and nursing note reviewed.  ?Constitutional:   ?   General:  She is not in acute distress. ?   Appearance: She is well-developed.  ?HENT:  ?   Head: Normocephalic and atraumatic.  ?   Right Ear: External ear normal.  ?   Left Ear: External ear normal.  ?   Nose: Nose normal.  ?Eyes:  ?   Conjunctiva/sclera: Conjunctivae normal.  ?Cardiovascular:  ?   Rate and Rhythm: Normal rate and regular rhythm.  ?   Pulses:     ?     Radial pulses are 2+ on the right side and 2+ on the left side.  ?     Dorsalis pedis pulses are 2+ on the right side and 2+ on the left side.  ?   Heart sounds: No murmur heard. ?   Comments: L UE dialysis fistula ?Pulmonary:  ?   Effort: No respiratory  distress.  ?   Breath sounds: No wheezing, rhonchi or rales.  ?Abdominal:  ?   Palpations: Abdomen is soft.  ?   Tenderness: There is generalized abdominal tenderness (mild). There is no guarding or rebound. Negative signs include Murphy's sign and McBurney's sign.  ?Musculoskeletal:  ?   Cervical back: Normal range of motion and neck supple.  ?   Right lower leg: No edema.  ?   Left lower leg: No edema.  ?Skin: ?   General: Skin is warm and dry.  ?   Findings: No rash.  ?Neurological:  ?   General: No focal deficit present.  ?   Mental Status: She is alert. Mental status is at baseline.  ?   Motor: No weakness.  ?Psychiatric:     ?   Mood and Affect: Mood normal.  ? ? ?ED Results / Procedures / Treatments   ?Labs ?(all labs ordered are listed, but only abnormal results are displayed) ?Labs Reviewed  ?COMPREHENSIVE METABOLIC PANEL - Abnormal; Notable for the following components:  ?    Result Value  ? Glucose, Bld 117 (*)   ? BUN 24 (*)   ? Creatinine, Ser 5.99 (*)   ? Calcium 8.8 (*)   ? GFR, Estimated 7 (*)   ? All other components within normal limits  ?CBC - Abnormal; Notable for the following components:  ? RBC 2.75 (*)   ? Hemoglobin 7.9 (*)   ? HCT 24.6 (*)   ? All other components within normal limits  ?URINALYSIS, ROUTINE W REFLEX MICROSCOPIC - Abnormal; Notable for the following components:  ? APPearance HAZY (*)   ? pH 9.0 (*)   ? Glucose, UA 50 (*)   ? Protein, ur >=300 (*)   ? Bacteria, UA FEW (*)   ? All other components within normal limits  ?URINE CULTURE  ?LIPASE, BLOOD  ? ?ED ECG REPORT ? ? Date: 09/05/2021 ? Rate: 70 ? Rhythm: normal sinus rhythm ? QRS Axis: normal ? Intervals: normal ? ST/T Wave abnormalities: normal ? Conduction Disutrbances:none ? Narrative Interpretation:  ? Old EKG Reviewed: unchanged ? ?I have personally reviewed the EKG tracing and agree with the computerized printout as noted. ? ? ?Radiology ?CT ABDOMEN PELVIS WO CONTRAST ? ?Result Date: 09/05/2021 ?CLINICAL DATA:  Left  flank pain EXAM: CT ABDOMEN AND PELVIS WITHOUT CONTRAST TECHNIQUE: Multidetector CT imaging of the abdomen and pelvis was performed following the standard protocol without IV contrast. RADIATION DOSE REDUCTION: This exam was performed according to the departmental dose-optimization program which includes automated exposure control, adjustment of the mA and/or kV according to patient size and/or use  of iterative reconstruction technique. COMPARISON:  None. FINDINGS: Lower chest: Small bilateral pleural effusions are seen. There are patchy infiltrates in both lower lung fields. Heart is enlarged in size. Small pericardial effusion is seen. Hepatobiliary: Liver is enlarged measuring 20.6 cm in length. No focal abnormality is seen. Surgical clips are seen in gallbladder fossa. Pancreas: No focal abnormality is seen. Spleen: Unremarkable. Adrenals/Urinary Tract: Adrenals are unremarkable. There is no hydronephrosis. There are no renal or ureteral stones. Urinary bladder is not distended. There is mild diffuse wall thickening in the bladder. This may be due to incomplete distention. Less likely possibility would be cystitis. Stomach/Bowel: Stomach is unremarkable. Small bowel loops are not dilated. Appendix is difficult to visualize. In the coronal image 57, there is a small caliber tubular structure adjacent to the cecum, possibly normal appendix. There is no pericecal inflammation. There is no significant wall thickening in the colon. There is no pericolic stranding. Vascular/Lymphatic: Scattered arterial calcifications are seen. Reproductive: There are coarse calcifications in the arcuate arteries in the uterus. Other: Small pelvic ascites is seen.  There is no pneumoperitoneum. Musculoskeletal: Unremarkable. IMPRESSION: There is no evidence of intestinal obstruction or pneumoperitoneum. There is no hydronephrosis. Small ascites. Small bilateral pleural effusions. Small pericardial effusion. There are patchy  infiltrates in both lower lung fields suggesting multifocal atelectasis/pneumonia. Other findings as described in the body of the report. Electronically Signed   By: Elmer Picker M.D.   On: 09/05/2021 12:14   ? ?Procedures ?Proc

## 2021-09-05 NOTE — Discharge Instructions (Addendum)
Please read and follow all provided instructions. ? ?Your diagnoses today include:  ?1. Generalized abdominal pain   ?2. Nausea   ? ? ?Tests performed today include: ?Blood cell counts and platelets: normal infection cell count, chronically low red blood cells ?Kidney and liver function tests ?Pancreas function test (called lipase) ?Urine test to look for infection: no signs of infection ?EKG: no sign of stress on the heart ?CT scan of the abdomen and pelvis: no signs of blockage, infection, or other major problems ?Vital signs. See below for your results today.  ? ?Medications prescribed:  ?Zofran (ondansetron) - for nausea and vomiting ? ?Acetaminophen (Tylenol) -medication for pain ? ?Take any prescribed medications only as directed. ? ?Home care instructions:  ?Follow any educational materials contained in this packet. ? ?Follow-up instructions: ?Please follow-up with your primary care provider in the next 2 days for further evaluation of your symptoms.   ? ?Return instructions:  ?SEEK IMMEDIATE MEDICAL ATTENTION IF: ?The pain does not go away or becomes severe  ?A temperature above 101F develops  ?Repeated vomiting occurs (multiple episodes)  ?The pain becomes localized to portions of the abdomen. The right side could possibly be appendicitis. In an adult, the left lower portion of the abdomen could be colitis or diverticulitis.  ?Blood is being passed in stools or vomit (bright red or black tarry stools)  ?You develop chest pain, difficulty breathing, dizziness or fainting, or become confused, poorly responsive, or inconsolable (young children) ?If you have any other emergent concerns regarding your health ? ?Additional Information: ?Abdominal (belly) pain can be caused by many things. Your caregiver performed an examination and possibly ordered blood/urine tests and imaging (CT scan, x-rays, ultrasound). Many cases can be observed and treated at home after initial evaluation in the emergency department. Even  though you are being discharged home, abdominal pain can be unpredictable. Therefore, you need a repeated exam if your pain does not resolve, returns, or worsens. Most patients with abdominal pain don't have to be admitted to the hospital or have surgery, but serious problems like appendicitis and gallbladder attacks can start out as nonspecific pain. Many abdominal conditions cannot be diagnosed in one visit, so follow-up evaluations are very important. ? ?Your vital signs today were: ?BP (!) 183/75   Pulse 72   Temp 98.7 ?F (37.1 ?C) (Oral)   Resp 18   SpO2 94%  ?If your blood pressure (bp) was elevated above 135/85 this visit, please have this repeated by your doctor within one month. ?-------------- ? ?

## 2021-09-05 NOTE — ED Provider Triage Note (Signed)
Emergency Medicine Provider Triage Evaluation Note ? ?Rachael Burke , a 66 y.o. female  was evaluated in triage.  Pt complains of left flank pain, abdominal pain, and dysuria. States that same has been ongoing for the past 6 days with worsening in the past 1-2 days. Presented to urgent care this morning who sent her here for further evaluation. Of note, patient with hx of ESRD on dialysis. ? ?Review of Systems  ?Positive:  ?Negative: See above ? ?Physical Exam  ?BP (!) 176/71 (BP Location: Right Arm)   Pulse 73   Temp 98.7 ?F (37.1 ?C) (Oral)   Resp 18   SpO2 96%  ?Gen:   Awake, no distress   ?Resp:  Normal effort  ?MSK:   Moves extremities without difficulty  ?Other:   ? ?Medical Decision Making  ?Medically screening exam initiated at 9:30 AM.  Appropriate orders placed.  [name] was informed that the remainder of the evaluation will be completed by another provider, this initial triage assessment does not replace that evaluation, and the importance of remaining in the ED until their evaluation is complete. ? ? ?  ?Bud Face, PA-C ?09/05/21 8441 ? ?

## 2021-09-05 NOTE — Discharge Instructions (Signed)
Please go to the emergency room as soon as you leave urgent care for further evaluation and management. ?

## 2021-09-05 NOTE — ED Triage Notes (Signed)
Patient complains left lower abdominal pain that radiates to left flank that started yesterday. Also complains of fatigue and generalized weakness for the last week. Patient reports difficulty and burning with urination and constipation for three days. Patient is alert, oriented, and in no apparent distress at this time. ?

## 2021-09-05 NOTE — ED Provider Notes (Signed)
?[place] ? ? ? ?CSN: [id number] ?Arrival date & time: 09/05/21  0802 ? ? ?  ? ?History   ?Chief Complaint ?Chief Complaint  ?Patient presents with  ? Flank Pain  ? ? ?HPI ?Rachael Burke is a 66 y.o. female.  ? ?Patient presents with left flank pain, abdominal pain, dysuria, urinary hesitancy.  Patient reports that lower abdominal pain and urinary symptoms have been present for approximately 6 days and flank pain started approximately 1 to 2 days ago.  Denies any fever, vaginal discharge, hematuria.  Patient reports that she saw her PCP 3 days prior for lower abdominal pain but is very vague on tests and treatment when evaluated by PCP.  Patient does have a history of end-stage renal disease where she has dialysis three times weekly and is currently trying to qualify for transplant list.  Daughter reports that she did not qualify last year for transplant due to having vertigo.  Patient also has low oxygen on initial triage but denies any shortness of breath or any chronic pulmonary conditions. ? ? ? ?Flank Pain ? ? ?Past Medical History:  ?Diagnosis Date  ? Anemia   ? Anxiety   ? Arthritis   ? Diabetes mellitus without complication ([place])   ? Heart murmur   ? echo 07/02/20: Mild MR/TR, mild-moderate AV sclerosis, bicuspid or functional bicuspid AV, no evidence of AS. Murmr felt due to AV.  ? History of blood transfusion   ? Hyperlipidemia   ? Hypertension   ? Pneumonia   ? PONV (postoperative nausea and vomiting)   ? Renal disorder   ? ? ?Patient Active Problem List  ? Diagnosis Date Noted  ? End stage renal disease on dialysis [facility]) 07/23/2020  ? Generalized anxiety disorder 07/23/2020  ? Acute renal failure superimposed on chronic kidney disease ([place]) 12/29/2019  ? Anemia associated with chronic renal failure 12/29/2019  ? Symptomatic anemia 04/08/2019  ? Hyperlipidemia 04/08/2019  ? AKI (acute kidney injury) ([place]) 04/07/2019  ? Hypertension associated with diabetes ([place]) 04/06/2019  ? Type 2  diabetes mellitus with complication, without long-term current use of insulin ([place]) 01/04/2018  ? ? ?Past Surgical History:  ?Procedure Laterality Date  ? AV FISTULA PLACEMENT Left 05/20/2020  ? Procedure: INSERTION OF LEFT ARM ARTERIOVENOUS (AV) FISTULA;  Surgeon: Marty Heck, MD;  Location: [place];  Service: Vascular;  Laterality: Left;  ? BASCILIC VEIN TRANSPOSITION Left 08/05/2020  ? Procedure: SECOND STAGE BASILIC VEIN TRANSPOSITION;  Surgeon: Marty Heck, MD;  Location: [place];  Service: Vascular;  Laterality: Left;  ? CESAREAN SECTION    ? CHOLECYSTECTOMY    ? INSERTION OF DIALYSIS CATHETER N/A 05/20/2020  ? Procedure: INSERTION OF DIALYSIS CATHETER;  Surgeon: Marty Heck, MD;  Location: [place];  Service: Vascular;  Laterality: N/A;  ? ? ?OB History   ?No obstetric history on file. ?  ? ? ? ?Home Medications   ? ?Prior to Admission medications   ?Medication Sig Start Date End Date Taking? Authorizing Provider  ?amLODipine (NORVASC) 10 MG tablet Take 10 mg by mouth daily.    [provider]  ?atorvastatin (LIPITOR) 20 MG tablet Take 1 tablet (20 mg total) by mouth daily. 01/01/20   Charlynne Cousins, MD  ?diclofenac Sodium (VOLTAREN) 1 % GEL Apply 4 g topically 4 (four) times daily. ?Patient taking differently: Apply 4 g topically daily as needed (Pain). 06/18/20   Just, Laurita Quint, FNP  ?docusate sodium (COLACE) 100 MG  capsule Take 1-2 capsules (100-200 mg total) by mouth 2 (two) times daily. Start with twice a day once having regular BM switch to daily ?Patient taking differently: Take 100 mg by mouth daily. 05/15/20   Just, Laurita Quint, FNP  ?escitalopram (LEXAPRO) 10 MG tablet Take 1 tablet (10 mg total) by mouth daily. 07/23/20 10/21/20  Horald Pollen, MD  ?furosemide (LASIX) 80 MG tablet Take 1 tablet (80 mg total) by mouth 2 (two) times daily. 01/01/20   Charlynne Cousins, MD  ?labetalol (NORMODYNE) 200 MG tablet Take 200 mg by mouth 2 (two) times daily.    [provider]  ?linagliptin (TRADJENTA) 5 MG TABS tablet Take 5 mg by mouth daily.    [provider]  ?oxyCODONE-acetaminophen (PERCOCET/ROXICET) 5-325 MG tablet Take 1 tablet by mouth every 6 (six) hours as needed. 08/05/20   Ulyses Amor, PA-C  ?oxyCODONE-acetaminophen (PERCOCET/ROXICET) 5-325 MG tablet Take 1 tablet by mouth every 6 (six) hours as needed. 08/05/20   Ulyses Amor, PA-C  ?polyethylene glycol powder (GLYCOLAX/MIRALAX) 17 GM/SCOOP powder Take 17 g by mouth 2 (two) times daily as needed. 05/15/20   Just, Laurita Quint, FNP  ?sitaGLIPtin (JANUVIA) 25 MG tablet Take 1 tablet (25 mg total) by mouth daily. 07/23/20 10/21/20  Horald Pollen, MD  ? ? ?Family History ?Family History  ?Problem Relation Age of Onset  ? Diabetes Mother   ? Hypertension Father   ? Diabetes Sister   ? Diabetes Brother   ? Diabetes Brother   ? ? ?Social History ?Social History  ? ?Tobacco Use  ? Smoking status: Never  ? Smokeless tobacco: Never  ?Vaping Use  ? Vaping Use: Never used  ?Substance Use Topics  ? Alcohol use: Never  ? Drug use: Never  ? ? ? ?Allergies   ?Patient has no known allergies. ? ? ?Review of Systems ?Review of Systems ?Per HPI ? ?Physical Exam ?Triage Vital Signs ?ED Triage Vitals  ?Enc Vitals Group  ?   BP 09/05/21 0832 (!) 163/74  ?   Pulse Rate 09/05/21 0832 72  ?   Resp 09/05/21 0832 18  ?   Temp 09/05/21 0832 97.8 ?F (36.6 ?C)  ?   Temp Source 09/05/21 0832 Oral  ?   SpO2 09/05/21 0832 (!) 89 %  ?   Weight --   ?   Height --   ?   Head Circumference --   ?   Peak Flow --   ?   Pain Score 09/05/21 0833 6  ?   Pain Loc --   ?   Pain Edu? --   ?   Excl. in [place]? --   ? ?No data found. ? ?Updated Vital Signs ?BP (!) 163/74 (BP Location: Right Arm)   Pulse 72   Temp 97.8 ?F (36.6 ?C) (Oral)   Resp 18   SpO2 96%  ? ?Visual Acuity ?Right Eye Distance:   ?Left Eye Distance:   ?Bilateral Distance:   ? ?Right Eye Near:   ?Left Eye Near:    ?Bilateral Near:    ? ?Physical Exam ?Constitutional:   ?    General: She is not in acute distress. ?   Appearance: Normal appearance. She is ill-appearing. She is not toxic-appearing or diaphoretic.  ?HENT:  ?   Head: Normocephalic and atraumatic.  ?Eyes:  ?   Extraocular Movements: Extraocular movements intact.  ?   Conjunctiva/sclera: Conjunctivae normal.  ?Cardiovascular:  ?   Rate  and Rhythm: Normal rate and regular rhythm.  ?   Pulses: Normal pulses.  ?   Heart sounds: Normal heart sounds.  ?Pulmonary:  ?   Effort: Pulmonary effort is normal. No respiratory distress.  ?   Breath sounds: Normal breath sounds.  ?Abdominal:  ?   General: Bowel sounds are normal. There is no distension.  ?   Palpations: Abdomen is soft.  ?   Tenderness: There is no abdominal tenderness.  ?Musculoskeletal:  ?   Comments: Tenderness to palpation to left flank.  ?Neurological:  ?   General: No focal deficit present.  ?   Mental Status: She is alert and oriented to person, place, and time. Mental status is at baseline.  ?Psychiatric:     ?   Mood and Affect: Mood normal.     ?   Behavior: Behavior normal.     ?   Thought Content: Thought content normal.     ?   Judgment: Judgment normal.  ? ? ? ?UC Treatments / Results  ?Labs ?(all labs ordered are listed, but only abnormal results are displayed) ?Labs Reviewed  ?POCT URINALYSIS DIPSTICK, ED / UC - Abnormal; Notable for the following components:  ?    Result Value  ? Hgb urine dipstick SMALL (*)   ? pH 8.5 (*)   ? Protein, ur >=300 (*)   ? Leukocytes,Ua TRACE (*)   ? All other components within normal limits  ? ? ?EKG ? ? ?Radiology ?No results found. ? ?Procedures ?Procedures (including critical care time) ? ?Medications Ordered in UC ?Medications - No data to display ? ?Initial Impression / Assessment and Plan / UC Course  ?I have reviewed the triage vital signs and the nursing notes. ? ?Pertinent labs & imaging results that were available during my care of the patient were reviewed by me and considered in my medical decision making (see  chart for details). ? ?  ? ?Unable to view PCP notes from previous visit this week.  Urinalysis was not definitive for urinary tract infection.  Patient is very ill-appearing and oxygen saturation is ranging from 8

## 2021-09-06 LAB — URINE CULTURE: Culture: NO GROWTH

## 2021-09-08 DIAGNOSIS — N186 End stage renal disease: Secondary | ICD-10-CM | POA: Diagnosis not present

## 2021-09-08 DIAGNOSIS — D689 Coagulation defect, unspecified: Secondary | ICD-10-CM | POA: Diagnosis not present

## 2021-09-08 DIAGNOSIS — E1122 Type 2 diabetes mellitus with diabetic chronic kidney disease: Secondary | ICD-10-CM | POA: Diagnosis not present

## 2021-09-08 DIAGNOSIS — R110 Nausea: Secondary | ICD-10-CM | POA: Diagnosis not present

## 2021-09-08 DIAGNOSIS — N2581 Secondary hyperparathyroidism of renal origin: Secondary | ICD-10-CM | POA: Diagnosis not present

## 2021-09-08 DIAGNOSIS — Z992 Dependence on renal dialysis: Secondary | ICD-10-CM | POA: Diagnosis not present

## 2021-09-08 DIAGNOSIS — D509 Iron deficiency anemia, unspecified: Secondary | ICD-10-CM | POA: Diagnosis not present

## 2021-09-08 DIAGNOSIS — D631 Anemia in chronic kidney disease: Secondary | ICD-10-CM | POA: Diagnosis not present

## 2021-09-10 DIAGNOSIS — D631 Anemia in chronic kidney disease: Secondary | ICD-10-CM | POA: Diagnosis not present

## 2021-09-10 DIAGNOSIS — N186 End stage renal disease: Secondary | ICD-10-CM | POA: Diagnosis not present

## 2021-09-10 DIAGNOSIS — D689 Coagulation defect, unspecified: Secondary | ICD-10-CM | POA: Diagnosis not present

## 2021-09-10 DIAGNOSIS — D509 Iron deficiency anemia, unspecified: Secondary | ICD-10-CM | POA: Diagnosis not present

## 2021-09-10 DIAGNOSIS — Z992 Dependence on renal dialysis: Secondary | ICD-10-CM | POA: Diagnosis not present

## 2021-09-10 DIAGNOSIS — R110 Nausea: Secondary | ICD-10-CM | POA: Diagnosis not present

## 2021-09-10 DIAGNOSIS — N2581 Secondary hyperparathyroidism of renal origin: Secondary | ICD-10-CM | POA: Diagnosis not present

## 2021-09-10 DIAGNOSIS — E1122 Type 2 diabetes mellitus with diabetic chronic kidney disease: Secondary | ICD-10-CM | POA: Diagnosis not present

## 2021-09-12 DIAGNOSIS — N2581 Secondary hyperparathyroidism of renal origin: Secondary | ICD-10-CM | POA: Diagnosis not present

## 2021-09-12 DIAGNOSIS — E1122 Type 2 diabetes mellitus with diabetic chronic kidney disease: Secondary | ICD-10-CM | POA: Diagnosis not present

## 2021-09-12 DIAGNOSIS — R110 Nausea: Secondary | ICD-10-CM | POA: Diagnosis not present

## 2021-09-12 DIAGNOSIS — N186 End stage renal disease: Secondary | ICD-10-CM | POA: Diagnosis not present

## 2021-09-12 DIAGNOSIS — D509 Iron deficiency anemia, unspecified: Secondary | ICD-10-CM | POA: Diagnosis not present

## 2021-09-12 DIAGNOSIS — Z992 Dependence on renal dialysis: Secondary | ICD-10-CM | POA: Diagnosis not present

## 2021-09-12 DIAGNOSIS — D631 Anemia in chronic kidney disease: Secondary | ICD-10-CM | POA: Diagnosis not present

## 2021-09-12 DIAGNOSIS — D689 Coagulation defect, unspecified: Secondary | ICD-10-CM | POA: Diagnosis not present

## 2021-09-15 DIAGNOSIS — R110 Nausea: Secondary | ICD-10-CM | POA: Diagnosis not present

## 2021-09-15 DIAGNOSIS — D689 Coagulation defect, unspecified: Secondary | ICD-10-CM | POA: Diagnosis not present

## 2021-09-15 DIAGNOSIS — D509 Iron deficiency anemia, unspecified: Secondary | ICD-10-CM | POA: Diagnosis not present

## 2021-09-15 DIAGNOSIS — N2581 Secondary hyperparathyroidism of renal origin: Secondary | ICD-10-CM | POA: Diagnosis not present

## 2021-09-15 DIAGNOSIS — N186 End stage renal disease: Secondary | ICD-10-CM | POA: Diagnosis not present

## 2021-09-15 DIAGNOSIS — D631 Anemia in chronic kidney disease: Secondary | ICD-10-CM | POA: Diagnosis not present

## 2021-09-15 DIAGNOSIS — E1122 Type 2 diabetes mellitus with diabetic chronic kidney disease: Secondary | ICD-10-CM | POA: Diagnosis not present

## 2021-09-15 DIAGNOSIS — Z992 Dependence on renal dialysis: Secondary | ICD-10-CM | POA: Diagnosis not present

## 2021-09-17 DIAGNOSIS — D509 Iron deficiency anemia, unspecified: Secondary | ICD-10-CM | POA: Diagnosis not present

## 2021-09-17 DIAGNOSIS — N186 End stage renal disease: Secondary | ICD-10-CM | POA: Diagnosis not present

## 2021-09-17 DIAGNOSIS — E1122 Type 2 diabetes mellitus with diabetic chronic kidney disease: Secondary | ICD-10-CM | POA: Diagnosis not present

## 2021-09-17 DIAGNOSIS — N2581 Secondary hyperparathyroidism of renal origin: Secondary | ICD-10-CM | POA: Diagnosis not present

## 2021-09-17 DIAGNOSIS — D631 Anemia in chronic kidney disease: Secondary | ICD-10-CM | POA: Diagnosis not present

## 2021-09-17 DIAGNOSIS — D689 Coagulation defect, unspecified: Secondary | ICD-10-CM | POA: Diagnosis not present

## 2021-09-17 DIAGNOSIS — Z992 Dependence on renal dialysis: Secondary | ICD-10-CM | POA: Diagnosis not present

## 2021-09-17 DIAGNOSIS — R110 Nausea: Secondary | ICD-10-CM | POA: Diagnosis not present

## 2021-09-19 DIAGNOSIS — D631 Anemia in chronic kidney disease: Secondary | ICD-10-CM | POA: Diagnosis not present

## 2021-09-19 DIAGNOSIS — R110 Nausea: Secondary | ICD-10-CM | POA: Diagnosis not present

## 2021-09-19 DIAGNOSIS — D689 Coagulation defect, unspecified: Secondary | ICD-10-CM | POA: Diagnosis not present

## 2021-09-19 DIAGNOSIS — Z992 Dependence on renal dialysis: Secondary | ICD-10-CM | POA: Diagnosis not present

## 2021-09-19 DIAGNOSIS — D509 Iron deficiency anemia, unspecified: Secondary | ICD-10-CM | POA: Diagnosis not present

## 2021-09-19 DIAGNOSIS — N2581 Secondary hyperparathyroidism of renal origin: Secondary | ICD-10-CM | POA: Diagnosis not present

## 2021-09-19 DIAGNOSIS — N186 End stage renal disease: Secondary | ICD-10-CM | POA: Diagnosis not present

## 2021-09-19 DIAGNOSIS — E1122 Type 2 diabetes mellitus with diabetic chronic kidney disease: Secondary | ICD-10-CM | POA: Diagnosis not present

## 2021-09-21 ENCOUNTER — Encounter (HOSPITAL_COMMUNITY): Payer: Self-pay | Admitting: Emergency Medicine

## 2021-09-21 ENCOUNTER — Observation Stay (HOSPITAL_COMMUNITY)
Admission: EM | Admit: 2021-09-21 | Discharge: 2021-09-23 | Disposition: A | Discharge status: Home / Self Care | Payer: Medicare Other | Attending: Internal Medicine | Admitting: Internal Medicine

## 2021-09-21 ENCOUNTER — Emergency Department (HOSPITAL_COMMUNITY): Payer: Medicare Other

## 2021-09-21 ENCOUNTER — Other Ambulatory Visit: Payer: Self-pay

## 2021-09-21 DIAGNOSIS — R042 Hemoptysis: Secondary | ICD-10-CM | POA: Diagnosis not present

## 2021-09-21 DIAGNOSIS — K2950 Unspecified chronic gastritis without bleeding: Secondary | ICD-10-CM | POA: Insufficient documentation

## 2021-09-21 DIAGNOSIS — Z79899 Other long term (current) drug therapy: Secondary | ICD-10-CM | POA: Insufficient documentation

## 2021-09-21 DIAGNOSIS — R1013 Epigastric pain: Secondary | ICD-10-CM | POA: Diagnosis not present

## 2021-09-21 DIAGNOSIS — J810 Acute pulmonary edema: Secondary | ICD-10-CM | POA: Diagnosis not present

## 2021-09-21 DIAGNOSIS — Z992 Dependence on renal dialysis: Secondary | ICD-10-CM | POA: Diagnosis not present

## 2021-09-21 DIAGNOSIS — R112 Nausea with vomiting, unspecified: Secondary | ICD-10-CM

## 2021-09-21 DIAGNOSIS — I3131 Malignant pericardial effusion in diseases classified elsewhere: Secondary | ICD-10-CM | POA: Diagnosis not present

## 2021-09-21 DIAGNOSIS — E1122 Type 2 diabetes mellitus with diabetic chronic kidney disease: Secondary | ICD-10-CM | POA: Insufficient documentation

## 2021-09-21 DIAGNOSIS — R918 Other nonspecific abnormal finding of lung field: Secondary | ICD-10-CM

## 2021-09-21 DIAGNOSIS — K920 Hematemesis: Principal | ICD-10-CM

## 2021-09-21 DIAGNOSIS — K3189 Other diseases of stomach and duodenum: Secondary | ICD-10-CM | POA: Insufficient documentation

## 2021-09-21 DIAGNOSIS — I3139 Other pericardial effusion (noninflammatory): Secondary | ICD-10-CM | POA: Insufficient documentation

## 2021-09-21 DIAGNOSIS — J90 Pleural effusion, not elsewhere classified: Secondary | ICD-10-CM | POA: Diagnosis not present

## 2021-09-21 DIAGNOSIS — N186 End stage renal disease: Secondary | ICD-10-CM | POA: Diagnosis not present

## 2021-09-21 DIAGNOSIS — I132 Hypertensive heart and chronic kidney disease with heart failure and with stage 5 chronic kidney disease, or end stage renal disease: Secondary | ICD-10-CM | POA: Insufficient documentation

## 2021-09-21 DIAGNOSIS — R911 Solitary pulmonary nodule: Secondary | ICD-10-CM | POA: Diagnosis not present

## 2021-09-21 DIAGNOSIS — I5033 Acute on chronic diastolic (congestive) heart failure: Secondary | ICD-10-CM | POA: Diagnosis not present

## 2021-09-21 DIAGNOSIS — R1110 Vomiting, unspecified: Secondary | ICD-10-CM | POA: Diagnosis not present

## 2021-09-21 DIAGNOSIS — J811 Chronic pulmonary edema: Secondary | ICD-10-CM | POA: Diagnosis not present

## 2021-09-21 DIAGNOSIS — R0602 Shortness of breath: Secondary | ICD-10-CM | POA: Diagnosis not present

## 2021-09-21 LAB — HEMOGLOBIN A1C
Hgb A1c MFr Bld: 4.5 % — ABNORMAL LOW (ref 4.8–5.6)
Mean Plasma Glucose: 82.45 mg/dL

## 2021-09-21 LAB — POC OCCULT BLOOD, ED: Fecal Occult Bld: NEGATIVE

## 2021-09-21 LAB — COMPREHENSIVE METABOLIC PANEL
ALT: 11 U/L (ref 0–44)
AST: 16 U/L (ref 15–41)
Albumin: 3.8 g/dL (ref 3.5–5.0)
Alkaline Phosphatase: 79 U/L (ref 38–126)
Anion gap: 13 (ref 5–15)
BUN: 29 mg/dL — ABNORMAL HIGH (ref 8–23)
CO2: 27 mmol/L (ref 22–32)
Calcium: 9.2 mg/dL (ref 8.9–10.3)
Chloride: 98 mmol/L (ref 98–111)
Creatinine, Ser: 7.14 mg/dL — ABNORMAL HIGH (ref 0.44–1.00)
GFR, Estimated: 6 mL/min — ABNORMAL LOW (ref >60)
Glucose, Bld: 95 mg/dL (ref 70–99)
Potassium: 4.3 mmol/L (ref 3.5–5.1)
Sodium: 138 mmol/L (ref 135–145)
Total Bilirubin: 0.9 mg/dL (ref 0.3–1.2)
Total Protein: 7.5 g/dL (ref 6.5–8.1)

## 2021-09-21 LAB — TYPE AND SCREEN
ABO/RH(D): A POS
Antibody Screen: NEGATIVE

## 2021-09-21 LAB — CBC WITH DIFFERENTIAL/PLATELET
Abs Immature Granulocytes: 0.05 10*3/uL (ref 0.00–0.07)
Basophils Absolute: 0 10*3/uL (ref 0.0–0.1)
Basophils Relative: 1 %
Eosinophils Absolute: 0.2 10*3/uL (ref 0.0–0.5)
Eosinophils Relative: 2 %
HCT: 29.5 % — ABNORMAL LOW (ref 36.0–46.0)
Hemoglobin: 9 g/dL — ABNORMAL LOW (ref 12.0–15.0)
Immature Granulocytes: 1 %
Lymphocytes Relative: 12 %
Lymphs Abs: 1 10*3/uL (ref 0.7–4.0)
MCH: 27.8 pg (ref 26.0–34.0)
MCHC: 30.5 g/dL (ref 30.0–36.0)
MCV: 91 fL (ref 80.0–100.0)
Monocytes Absolute: 0.7 10*3/uL (ref 0.1–1.0)
Monocytes Relative: 8 %
Neutro Abs: 6.8 10*3/uL (ref 1.7–7.7)
Neutrophils Relative %: 76 %
Platelets: 140 10*3/uL — ABNORMAL LOW (ref 150–400)
RBC: 3.24 MIL/uL — ABNORMAL LOW (ref 3.87–5.11)
RDW: 17.2 % — ABNORMAL HIGH (ref 11.5–15.5)
WBC: 8.9 10*3/uL (ref 4.0–10.5)
nRBC: 0.2 % (ref 0.0–0.2)

## 2021-09-21 LAB — HEMOGLOBIN AND HEMATOCRIT, BLOOD
HCT: 28.2 % — ABNORMAL LOW (ref 36.0–46.0)
Hemoglobin: 8.6 g/dL — ABNORMAL LOW (ref 12.0–15.0)

## 2021-09-21 LAB — TROPONIN I (HIGH SENSITIVITY)
Troponin I (High Sensitivity): 8 ng/L (ref <18)
Troponin I (High Sensitivity): 7 ng/L (ref <18)

## 2021-09-21 LAB — GLUCOSE, CAPILLARY: Glucose-Capillary: 91 mg/dL (ref 70–99)

## 2021-09-21 LAB — LIPASE, BLOOD: Lipase: 40 U/L (ref 11–51)

## 2021-09-21 LAB — MRSA NEXT GEN BY PCR, NASAL: MRSA by PCR Next Gen: NOT DETECTED

## 2021-09-21 LAB — PHOSPHORUS: Phosphorus: 4.2 mg/dL (ref 2.5–4.6)

## 2021-09-21 LAB — PROTIME-INR
INR: 1 (ref 0.8–1.2)
Prothrombin Time: 13.3 seconds (ref 11.4–15.2)

## 2021-09-21 LAB — CBG MONITORING, ED: Glucose-Capillary: 107 mg/dL — ABNORMAL HIGH (ref 70–99)

## 2021-09-21 MED ORDER — HYDRALAZINE HCL 25 MG PO TABS
25.0000 mg | ORAL_TABLET | Freq: Four times a day (QID) | ORAL | Status: DC | PRN
  Administered 2021-09-21: 25 mg via ORAL
  Filled 2021-09-21: qty 1

## 2021-09-21 MED ORDER — LINAGLIPTIN 5 MG PO TABS
5.0000 mg | ORAL_TABLET | Freq: Every day | ORAL | Status: DC
  Administered 2021-09-23: 5 mg via ORAL
  Filled 2021-09-21: qty 1

## 2021-09-21 MED ORDER — PANTOPRAZOLE SODIUM 40 MG IV SOLR
40.0000 mg | Freq: Once | INTRAVENOUS | Status: AC
  Administered 2021-09-21: 40 mg via INTRAVENOUS
  Filled 2021-09-21: qty 10

## 2021-09-21 MED ORDER — DOCUSATE SODIUM 100 MG PO CAPS
100.0000 mg | ORAL_CAPSULE | Freq: Two times a day (BID) | ORAL | Status: DC
  Administered 2021-09-21 – 2021-09-22 (×2): 100 mg via ORAL
  Filled 2021-09-21 (×2): qty 1

## 2021-09-21 MED ORDER — INSULIN ASPART 100 UNIT/ML IJ SOLN: 0.0000 [IU] | Freq: Three times a day (TID) | INTRAMUSCULAR | Status: DC

## 2021-09-21 MED ORDER — CHLORHEXIDINE GLUCONATE CLOTH 2 % EX PADS
6.0000 | MEDICATED_PAD | Freq: Every day | CUTANEOUS | Status: DC
  Administered 2021-09-23: 6 via TOPICAL

## 2021-09-21 MED ORDER — METOPROLOL TARTRATE 5 MG/5ML IV SOLN
5.0000 mg | Freq: Four times a day (QID) | INTRAVENOUS | Status: DC
  Administered 2021-09-21 – 2021-09-23 (×3): 5 mg via INTRAVENOUS
  Filled 2021-09-21 (×3): qty 5

## 2021-09-21 MED ORDER — ACETAMINOPHEN 325 MG PO TABS
650.0000 mg | ORAL_TABLET | Freq: Four times a day (QID) | ORAL | Status: DC | PRN
  Administered 2021-09-22 (×2): 650 mg via ORAL
  Filled 2021-09-21 (×2): qty 2

## 2021-09-21 MED ORDER — LABETALOL HCL 200 MG PO TABS: 200.0000 mg | ORAL_TABLET | Freq: Two times a day (BID) | ORAL | Status: DC

## 2021-09-21 MED ORDER — ONDANSETRON HCL 4 MG/2ML IJ SOLN
4.0000 mg | Freq: Once | INTRAMUSCULAR | Status: AC
  Administered 2021-09-21: 4 mg via INTRAVENOUS
  Filled 2021-09-21: qty 2

## 2021-09-21 MED ORDER — AMLODIPINE BESYLATE 10 MG PO TABS: 10.0000 mg | ORAL_TABLET | Freq: Every day | ORAL | Status: DC

## 2021-09-21 MED ORDER — AMLODIPINE BESYLATE 10 MG PO TABS
10.0000 mg | ORAL_TABLET | Freq: Every day | ORAL | Status: DC
Start: 2021-09-21 — End: 2021-09-23
  Administered 2021-09-21 – 2021-09-23 (×2): 10 mg via ORAL
  Filled 2021-09-21 (×2): qty 1

## 2021-09-21 MED ORDER — LABETALOL HCL 200 MG PO TABS
200.0000 mg | ORAL_TABLET | Freq: Two times a day (BID) | ORAL | Status: DC
Start: 2021-09-21 — End: 2021-09-23
  Administered 2021-09-21 – 2021-09-22 (×2): 200 mg via ORAL
  Filled 2021-09-21 (×2): qty 1

## 2021-09-21 MED ORDER — ATORVASTATIN CALCIUM 10 MG PO TABS
20.0000 mg | ORAL_TABLET | Freq: Every day | ORAL | Status: DC
  Administered 2021-09-23: 20 mg via ORAL
  Filled 2021-09-21: qty 2

## 2021-09-21 MED ORDER — ONDANSETRON 4 MG PO TBDP: 4.0000 mg | ORAL_TABLET | Freq: Three times a day (TID) | ORAL | Status: DC | PRN

## 2021-09-21 MED ORDER — IOHEXOL 350 MG/ML SOLN
100.0000 mL | Freq: Once | INTRAVENOUS | Status: AC | PRN
  Administered 2021-09-21: 100 mL via INTRAVENOUS

## 2021-09-21 MED ORDER — PANTOPRAZOLE SODIUM 40 MG PO TBEC
40.0000 mg | DELAYED_RELEASE_TABLET | Freq: Two times a day (BID) | ORAL | Status: DC
  Administered 2021-09-21 – 2021-09-23 (×3): 40 mg via ORAL
  Filled 2021-09-21 (×3): qty 1

## 2021-09-21 MED ORDER — HYDRALAZINE HCL 20 MG/ML IJ SOLN: 5.0000 mg | Freq: Four times a day (QID) | INTRAMUSCULAR | Status: DC | PRN

## 2021-09-21 MED ORDER — POLYETHYLENE GLYCOL 3350 17 GM/SCOOP PO POWD
17.0000 g | Freq: Two times a day (BID) | ORAL | Status: DC | PRN
  Filled 2021-09-21: qty 255

## 2021-09-21 MED ORDER — DICLOFENAC SODIUM 1 % EX GEL
4.0000 g | Freq: Four times a day (QID) | CUTANEOUS | Status: DC
  Filled 2021-09-21: qty 100

## 2021-09-21 NOTE — ED Triage Notes (Signed)
Pt reports bloody emesis x3 yesterday and x2 overnight. Also endorses SOB overnight. Last HD Friday. Pt has a tightness in her abd.  ?

## 2021-09-21 NOTE — Progress Notes (Signed)
?   09/21/21 1831  ?Assess: MEWS Score  ?Temp 98.2 ?F (36.8 ?C)  ?BP (!) 206/82  ?Pulse Rate 79  ?Resp 16  ?Level of Consciousness Alert  ?SpO2 98 %  ?O2 Device Room Air  ?Assess: MEWS Score  ?MEWS Temp 0  ?MEWS Systolic 2  ?MEWS Pulse 0  ?MEWS RR 0  ?MEWS LOC 0  ?MEWS Score 2  ?MEWS Score Color Yellow  ?Assess: if the MEWS score is Yellow or Red  ?Were vital signs taken at a resting state? Yes  ?Focused Assessment No change from prior assessment  ?MEWS guidelines implemented *See Row Information* Yes  ?Treat  ?Pain Scale 0-10  ?Pain Score 0  ?Take Vital Signs  ?Increase Vital Sign Frequency  Yellow: Q 2hr X 2 then Q 4hr X 2, if remains yellow, continue Q 4hrs  ?Escalate  ?MEWS: Escalate Yellow: discuss with charge nurse/RN and consider discussing with provider and RRT  ?Notify: Charge Nurse/RN  ?Name of Charge Nurse/RN Notified Gwenlyn Perking RN  ?Date Charge Nurse/RN Notified 09/21/21  ?Time Charge Nurse/RN Notified 1855  ?Notify: Provider  ?Provider Name/Title Roosevelt Locks MD  ?Date Provider Notified 09/21/21  ?Time Provider Notified [phone]  ?Notification Type Page  ?Notification Reason Other (Comment) ?Thora Lance mews)  ?Provider response See new orders  ?Date of Provider Response 09/21/21  ?Time of Provider Response 1905  ?Document  ?Patient Outcome Other (Comment) ?(continue to monitor)  ?Progress note created (see row info) Yes  ? ? ?

## 2021-09-21 NOTE — Progress Notes (Signed)
D/W pulm attending, who feels hematemesis> hemoptysis. ? ?[facility] consulted, NPO after midnight. ?

## 2021-09-21 NOTE — H&P (Signed)
?History and Physical  ? ? ?Rachael Burke UXL:[id number] DOB: [birth year] DOA: 09/21/2021 ? ?PCP: Trey Sailors, PA (Confirm with patient/family/NH records and if not entered, this has to be entered at [facility] point of entry) ?Patient coming from: Home ? ?I have personally briefly reviewed patient's old medical records in [place] ? ?Chief Complaint: Delsa Grana out blood clot ? ?HPI: Rachael Burke is a 66 y.o. female with medical history significant of ESRD on HD, HTN, IIDM, chronic diastolic CHF, presented with new onset of spitting out fresh red blood. ? ?Her symptoms started about 2 weeks ago, usual symptoms including severe nauseous, new onset of epigastric pain radiating to the back, sharp-like, sharp-like and frequent retching but no vomiting.  She reported the pain has been, goes but no particular relation to her meals.  She does feel frequent epigastric pain at night as well.  She came to ED on April 7, CT scan abdomen pelvis negative for acute findings patient discharged home.  Patient then went to see PCP, who suspect she developed an ulcer and started on sucralfate.  She takes sucralfate 1-2 times a day for the last 1 week with no significant improvement.  Yesterday morning, she woke up with " something in her throat" and spit up fresh red blood.  First time, size about one quarter of a cup, bright bright with clotting.  Daughter took 1 picture (attached below).  The patient denies any cough or shortness of breath nauseous vomiting.  Then she subsequently spat 3 times with bright red blood with clots.  Including most recent episode this morning about 5 AM.  She does become more nauseous this morning and did not take her morning BP pills.  Her last dialysis was on Friday. ? ?ED Course: Blood pressure significant elevated, no hypoxia.  Hemoglobin 9.0 compared to 7.92 weeks ago.  Guaiac test negative. ?CT angiogram negative for PE, incidental finding of 8 mm right lung nodule, and some  lung vascular congestion.  Nephrology consulted.  BUN 29, creatinine 7.1. ? ?Review of Systems: As per HPI otherwise 14 point review of systems negative.  ? ? ?Past Medical History:  ?Diagnosis Date  ? Anemia   ? Anxiety   ? Arthritis   ? Diabetes mellitus without complication ([place])   ? Heart murmur   ? echo 07/02/20: Mild MR/TR, mild-moderate AV sclerosis, bicuspid or functional bicuspid AV, no evidence of AS. Murmr felt due to AV.  ? History of blood transfusion   ? Hyperlipidemia   ? Hypertension   ? Pneumonia   ? PONV (postoperative nausea and vomiting)   ? Renal disorder   ? ? ?Past Surgical History:  ?Procedure Laterality Date  ? AV FISTULA PLACEMENT Left 05/20/2020  ? Procedure: INSERTION OF LEFT ARM ARTERIOVENOUS (AV) FISTULA;  Surgeon: Marty Heck, MD;  Location: [place];  Service: Vascular;  Laterality: Left;  ? BASCILIC VEIN TRANSPOSITION Left 08/05/2020  ? Procedure: SECOND STAGE BASILIC VEIN TRANSPOSITION;  Surgeon: Marty Heck, MD;  Location: [place];  Service: Vascular;  Laterality: Left;  ? CESAREAN SECTION    ? CHOLECYSTECTOMY    ? INSERTION OF DIALYSIS CATHETER N/A 05/20/2020  ? Procedure: INSERTION OF DIALYSIS CATHETER;  Surgeon: Marty Heck, MD;  Location: [place];  Service: Vascular;  Laterality: N/A;  ? ? ? reports that she has never smoked. She has never used smokeless tobacco. She reports that she does not drink alcohol and does not use drugs. ? ?  No Known Allergies ? ?Family History  ?Problem Relation Age of Onset  ? Diabetes Mother   ? Hypertension Father   ? Diabetes Sister   ? Diabetes Brother   ? Diabetes Brother   ? ? ? ?Prior to Admission medications   ?Medication Sig Start Date End Date Taking? Authorizing Provider  ?acetaminophen (TYLENOL) 325 MG tablet Take 2 tablets (650 mg total) by mouth every 6 (six) hours as needed for mild pain or moderate pain. 09/05/21   Carlisle Cater, PA-C  ?amLODipine (NORVASC) 10 MG tablet Take 10 mg by mouth daily.    [provider]  ?atorvastatin (LIPITOR) 20 MG tablet Take 1 tablet (20 mg total) by mouth daily. 01/01/20   Charlynne Cousins, MD  ?diclofenac Sodium (VOLTAREN) 1 % GEL Apply 4 g topically 4 (four) times daily. ?Patient taking differently: Apply 4 g topically daily as needed (Pain). 06/18/20   Just, Laurita Quint, FNP  ?docusate sodium (COLACE) 100 MG capsule Take 1-2 capsules (100-200 mg total) by mouth 2 (two) times daily. Start with twice a day once having regular BM switch to daily ?Patient taking differently: Take 100 mg by mouth daily. 05/15/20   Just, Laurita Quint, FNP  ?escitalopram (LEXAPRO) 10 MG tablet Take 1 tablet (10 mg total) by mouth daily. 07/23/20 10/21/20  Horald Pollen, MD  ?furosemide (LASIX) 80 MG tablet Take 1 tablet (80 mg total) by mouth 2 (two) times daily. 01/01/20   Charlynne Cousins, MD  ?labetalol (NORMODYNE) 200 MG tablet Take 200 mg by mouth 2 (two) times daily.    [provider]  ?linagliptin (TRADJENTA) 5 MG TABS tablet Take 5 mg by mouth daily.    [provider]  ?ondansetron (ZOFRAN-ODT) 4 MG disintegrating tablet Take 1 tablet (4 mg total) by mouth every 8 (eight) hours as needed for nausea or vomiting. 09/05/21   Carlisle Cater, PA-C  ?polyethylene glycol powder (GLYCOLAX/MIRALAX) 17 GM/SCOOP powder Take 17 g by mouth 2 (two) times daily as needed. 05/15/20   Just, Laurita Quint, FNP  ?sitaGLIPtin (JANUVIA) 25 MG tablet Take 1 tablet (25 mg total) by mouth daily. 07/23/20 10/21/20  Horald Pollen, MD  ? ? ?Physical Exam: ?Vitals:  ? 09/21/21 1215 09/21/21 1409 09/21/21 1445 09/21/21 1500  ?BP: (!) 191/74 (!) 189/77 (!) 189/72 (!) 194/78  ?Pulse: 70 70 73 71  ?Resp: 18 17 19 16   ?Temp:      ?TempSrc:      ?SpO2: 95% 93% 93% 92%  ?Weight:      ?Height:      ? ? ?Constitutional: NAD, calm, comfortable ?Vitals:  ? 09/21/21 1215 09/21/21 1409 09/21/21 1445 09/21/21 1500  ?BP: (!) 191/74 (!) 189/77 (!) 189/72 (!) 194/78  ?Pulse: 70 70 73 71  ?Resp: 18 17 19 16   ?Temp:       ?TempSrc:      ?SpO2: 95% 93% 93% 92%  ?Weight:      ?Height:      ? ?Eyes: PERRL, lids and conjunctivae normal ?ENMT: Mucous membranes are moist. Posterior pharynx clear of any exudate or lesions.Normal dentition.  ?Neck: normal, supple, no masses, no thyromegaly ?Respiratory: clear to auscultation bilaterally, no wheezing, fine crackles on B/L lower fields. Normal respiratory effort. No accessory muscle use.  ?Cardiovascular: Regular rate and rhythm, no murmurs / rubs / gallops. No extremity edema. 2+ pedal pulses. No carotid bruits.  ?Abdomen: no tenderness, no masses palpated. No hepatosplenomegaly. Bowel sounds positive.  ?Musculoskeletal: no  clubbing / cyanosis. No joint deformity upper and lower extremities. Good ROM, no contractures. Normal muscle tone.  ?Skin: no rashes, lesions, ulcers. No induration ?Neurologic: CN 2-12 grossly intact. Sensation intact, DTR normal. Strength 5/5 in all 4.  ?Psychiatric: Normal judgment and insight. Alert and oriented x 3. Normal mood.  ? ? ? ? ?Labs on Admission: I have personally reviewed following labs and imaging studies ? ?CBC: ?Recent Labs  ?Lab 09/21/21 ?1007  ?WBC 8.9  ?NEUTROABS 6.8  ?HGB 9.0*  ?HCT 29.5*  ?MCV 91.0  ?PLT 140*  ? ?Basic Metabolic Panel: ?Recent Labs  ?Lab 09/21/21 ?1007  ?NA 138  ?K 4.3  ?CL 98  ?CO2 27  ?GLUCOSE 95  ?BUN 29*  ?CREATININE 7.14*  ?CALCIUM 9.2  ? ?GFR: ?Estimated Creatinine Clearance: 7.3 mL/min (A) (by C-G formula based on SCr of 7.14 mg/dL (H)). ?Liver Function Tests: ?Recent Labs  ?Lab 09/21/21 ?1007  ?AST 16  ?ALT 11  ?ALKPHOS 79  ?BILITOT 0.9  ?PROT 7.5  ?ALBUMIN 3.8  ? ?Recent Labs  ?Lab 09/21/21 ?1007  ?LIPASE 40  ? ?No results for input(s): AMMONIA in the last 168 hours. ?Coagulation Profile: ?Recent Labs  ?Lab 09/21/21 ?1007  ?INR 1.0  ? ?Cardiac Enzymes: ?No results for input(s): CKTOTAL, CKMB, CKMBINDEX, TROPONINI in the last 168 hours. ?BNP (last 3 results) ?No results for input(s): PROBNP in the last 8760  hours. ?HbA1C: ?No results for input(s): HGBA1C in the last 72 hours. ?CBG: ?No results for input(s): GLUCAP in the last 168 hours. ?Lipid Profile: ?No results for input(s): CHOL, HDL, LDLCALC, TRIG, CHOLHDL, LDLDIRECT in

## 2021-09-21 NOTE — ED Provider Notes (Signed)
?[facility] ?Provider Note ? ? ?CSN: [id number] ?Arrival date & time: 09/21/21  1007 ? ?  ? ?History ? ?Chief Complaint  ?Patient presents with  ? Emesis  ? Shortness of Breath  ? ? ?Rachael Burke is a 66 y.o. female. ? ? ?Emesis ?Associated symptoms: no fever   ?Shortness of Breath ?Associated symptoms: vomiting   ?Associated symptoms: no fever   ? ?Patient presents to the ER for evaluation of shortness of breath and possible hematemesis.  Patient has history of diabetes, anemia, hypertension hyperlipidemia chronic kidney disease on dialysis.  Patient states she has been told by her dialysis team that her hemoglobin has been trending downward.  I have checked her stools and she was guaiac negative.  Patient states last evening she started to feel like there is something in the back of her throat.  She felt nauseated and vomited.  Patient did notice bright red blood.  She has had 3 episodes yesterday and 2 last night.  She is having some pain in her upper abdomen.  She also started feeling short of breath.  She has not been coughing.  No fevers.  Pain in upper abdomen does move towards her chest. ? ?Home Medications ?Prior to Admission medications   ?Medication Sig Start Date End Date Taking? Authorizing Provider  ?acetaminophen (TYLENOL) 325 MG tablet Take 2 tablets (650 mg total) by mouth every 6 (six) hours as needed for mild pain or moderate pain. 09/05/21   Carlisle Cater, PA-C  ?amLODipine (NORVASC) 10 MG tablet Take 10 mg by mouth daily.    [provider]  ?atorvastatin (LIPITOR) 20 MG tablet Take 1 tablet (20 mg total) by mouth daily. 01/01/20   Charlynne Cousins, MD  ?diclofenac Sodium (VOLTAREN) 1 % GEL Apply 4 g topically 4 (four) times daily. ?Patient taking differently: Apply 4 g topically daily as needed (Pain). 06/18/20   Just, Laurita Quint, FNP  ?docusate sodium (COLACE) 100 MG capsule Take 1-2 capsules (100-200 mg total) by mouth 2 (two) times  daily. Start with twice a day once having regular BM switch to daily ?Patient taking differently: Take 100 mg by mouth daily. 05/15/20   Just, Laurita Quint, FNP  ?escitalopram (LEXAPRO) 10 MG tablet Take 1 tablet (10 mg total) by mouth daily. 07/23/20 10/21/20  Horald Pollen, MD  ?furosemide (LASIX) 80 MG tablet Take 1 tablet (80 mg total) by mouth 2 (two) times daily. 01/01/20   Charlynne Cousins, MD  ?labetalol (NORMODYNE) 200 MG tablet Take 200 mg by mouth 2 (two) times daily.    [provider]  ?linagliptin (TRADJENTA) 5 MG TABS tablet Take 5 mg by mouth daily.    [provider]  ?ondansetron (ZOFRAN-ODT) 4 MG disintegrating tablet Take 1 tablet (4 mg total) by mouth every 8 (eight) hours as needed for nausea or vomiting. 09/05/21   Carlisle Cater, PA-C  ?polyethylene glycol powder (GLYCOLAX/MIRALAX) 17 GM/SCOOP powder Take 17 g by mouth 2 (two) times daily as needed. 05/15/20   Just, Laurita Quint, FNP  ?sitaGLIPtin (JANUVIA) 25 MG tablet Take 1 tablet (25 mg total) by mouth daily. 07/23/20 10/21/20  Horald Pollen, MD  ?   ? ?Allergies    ?Patient has no known allergies.   ? ?Review of Systems   ?Review of Systems  ?Constitutional:  Negative for fever.  ?Respiratory:  Positive for shortness of breath.   ?Gastrointestinal:  Positive for vomiting.  ? ?Physical Exam ?Updated  Vital Signs ?BP (!) 194/78   Pulse 71   Temp 98.3 ?F (36.8 ?C) (Oral)   Resp 16   Ht 1.6 m (5\' 3" )   Wt 69 kg   SpO2 92%   BMI 26.95 kg/m?  ?Physical Exam ?Vitals and nursing note reviewed.  ?Constitutional:   ?   Appearance: She is well-developed. She is ill-appearing.  ?HENT:  ?   Head: Normocephalic and atraumatic.  ?   Right Ear: External ear normal.  ?   Left Ear: External ear normal.  ?Eyes:  ?   General: No scleral icterus.    ?   Right eye: No discharge.     ?   Left eye: No discharge.  ?   Conjunctiva/sclera: Conjunctivae normal.  ?Neck:  ?   Trachea: No tracheal deviation.  ?Cardiovascular:  ?   Rate and  Rhythm: Normal rate and regular rhythm.  ?Pulmonary:  ?   Effort: Pulmonary effort is normal. No respiratory distress.  ?   Breath sounds: Normal breath sounds. No stridor. No wheezing or rales.  ?Chest:  ?   Chest wall: Tenderness present.  ?Abdominal:  ?   General: Bowel sounds are normal. There is no distension.  ?   Palpations: Abdomen is soft.  ?   Tenderness: There is abdominal tenderness in the epigastric area. There is guarding. There is no rebound.  ?Musculoskeletal:     ?   General: No tenderness or deformity.  ?   Cervical back: Neck supple.  ?Skin: ?   General: Skin is warm and dry.  ?   Findings: No rash.  ?Neurological:  ?   General: No focal deficit present.  ?   Mental Status: She is alert.  ?   Cranial Nerves: No cranial nerve deficit (no facial droop, extraocular movements intact, no slurred speech).  ?   Sensory: No sensory deficit.  ?   Motor: No abnormal muscle tone or seizure activity.  ?   Coordination: Coordination normal.  ?Psychiatric:     ?   Mood and Affect: Mood normal.  ? ? ?ED Results / Procedures / Treatments   ?Labs ?(all labs ordered are listed, but only abnormal results are displayed) ?Labs Reviewed  ?COMPREHENSIVE METABOLIC PANEL - Abnormal; Notable for the following components:  ?    Result Value  ? BUN 29 (*)   ? Creatinine, Ser 7.14 (*)   ? GFR, Estimated 6 (*)   ? All other components within normal limits  ?CBC WITH DIFFERENTIAL/PLATELET - Abnormal; Notable for the following components:  ? RBC 3.24 (*)   ? Hemoglobin 9.0 (*)   ? HCT 29.5 (*)   ? RDW 17.2 (*)   ? Platelets 140 (*)   ? All other components within normal limits  ?PROTIME-INR  ?LIPASE, BLOOD  ?POC OCCULT BLOOD, ED  ?TYPE AND SCREEN  ?TROPONIN I (HIGH SENSITIVITY)  ?TROPONIN I (HIGH SENSITIVITY)  ? ? ?EKG ?EKG Interpretation ? ?Date/Time:  Sunday September 21 2021 10:31:18 EDT ?Ventricular Rate:  75 ?PR Interval:  183 ?QRS Duration: 88 ?QT Interval:  415 ?QTC Calculation: 464 ?R Axis:   -2 ?Text Interpretation: Sinus  rhythm No significant change since last tracing Confirmed by Dorie Rank [phone]) on 09/21/2021 10:41:55 AM ? ?Radiology ?CT Angio Chest PE W and/or Wo Contrast ? ?Result Date: 09/21/2021 ?CLINICAL DATA:  High probability of pulmonary embolism. EXAM: CT ANGIOGRAPHY CHEST WITH CONTRAST TECHNIQUE: Multidetector CT imaging of the chest was performed using the standard protocol  during bolus administration of intravenous contrast. Multiplanar CT image reconstructions and MIPs were obtained to evaluate the vascular anatomy. RADIATION DOSE REDUCTION: This exam was performed according to the departmental dose-optimization program which includes automated exposure control, adjustment of the mA and/or kV according to patient size and/or use of iterative reconstruction technique. CONTRAST:  17mL OMNIPAQUE IOHEXOL 350 MG/ML SOLN COMPARISON:  Chest radiograph dated September 21, 2021 FINDINGS: Cardiovascular: Satisfactory opacification of the pulmonary arteries to the segmental level. No evidence of pulmonary embolism. Normal heart size. No pericardial effusion. Enlarged heart. Pericardial effusion measuring 9 mm in greatest thickness. Calcific atherosclerotic disease of the aorta. Mediastinum/Nodes: No enlarged mediastinal, hilar, or axillary lymph nodes. Thyroid gland, trachea, and esophagus demonstrate no significant findings. Lungs/Pleura: Interstitial pulmonary edema. Multiple few mm right upper lobe ground-glass subpleural nodules. 3 mm subpleural pulmonary nodule, image 49/134. Areas of probable rounded atelectasis in the right middle lobe, lingula and the dependent portions of the bilateral lower lobes. 8 mm soft tissue mass with coarse central calcification in the right lower lobe, image 58/134. bilateral small pleural effusions. Upper Abdomen: No acute abnormality. Musculoskeletal: No chest wall abnormality. No acute or significant osseous findings. Review of the MIP images confirms the above findings. IMPRESSION: 1. No  evidence of pulmonary embolism. 2. Cardiomegaly with moderate in size pericardial effusion. 3. Interstitial pulmonary edema with small bilateral pleural effusions. 4. Areas of probable rounded atelectasis in the righ

## 2021-09-21 NOTE — Consult Note (Addendum)
NAME:  Rachael Burke, MRN:  [id number], DOB:  1956/02/27, LOS: 0 ADMISSION DATE:  09/21/2021, CONSULTATION DATE: 09/21/2021 REFERRING MD:  Dr. Chipper Herb, [facility], CHIEF COMPLAINT:  Bringing up blood   History of Present Illness:  66 yo female presented to ER with epigastric pain radiating to her back associated with bringing up blood, and dyspnea.  There was question regarding whether she was coughing blood or vomiting blood.  She reports that doctors in [place] told her she had a stomach ulcer a few years ago, and she was recently started on sucralfate as an outpt.  No cough, fever, or wheeze.  Noted to regurgitate blood while eating a sandwich in the ER.  She was seen in the ER also on 09/05/21 for abdominal pain, nausea and vomiting.  She was treated with zofran and tylenol at that time.  She was seen in ER on 07/28/21 for anemia with Hb 5.8 and required blood transfusion.  It is unclear what assessment was done for her anemia at that time.  Patient reports that she feels food gets stuck in her chest.  Pt is Spanish speaking and assistance obtained from family in translating.  Pertinent  Medical History  ESRD on HD M/W/F, Anemia, Anxiety, Arthritis, DM type 2, HLD, HTN, Pneumonia, Diastolic CHF  Significant Hospital Events: Including procedures, antibiotic start and stop dates in addition to other pertinent events   4/23 Admit, GI consulted  Studies:  Echo 07/15/21 >> EF 55 to 60%, prolonged LV relaxation pattern, mild LA/RA dilation, no pericardial effusion CT abd/pelvis 09/05/21 >> small effusions with patchy lower lobe infiltrates, cardiomegaly, small pericardial effusion, enlarged liver, atherosclerosis CT angio chest 09/21/21 >> cardiomegaly, pericardial effusion, atherosclerosis, interstitial edema, multiple GGO subpleural nodules up to 3 mm, rounded ATX in RML/Lingula, and lower lobes, 8 mm nodule with central calcification RLL, small pleural effusions  Interim History / Subjective:   She had dialysis last on 4/21.  Her breathing improved temporarily after dialysis, but she started getting short of breath again in the evening of 4/21 and this has persisted.  Her daughter reports that the patients systolic blood pressure is normal 170 to 180 and her doctor recently increased her blood pressure medication.  No history of NSAID or aspirin use, alcohol abuse, or cigarette smoking.  Objective   Blood pressure (!) 199/73, pulse 73, temperature 98.3 F (36.8 C), temperature source Oral, resp. rate 17, height 5\' 3"  (1.6 m), weight 69 kg, SpO2 91 %.       No intake or output data in the 24 hours ending 09/21/21 1617 Filed Weights   09/21/21 1019  Weight: 69 kg    Examination:  General - alert Eyes - pupils reactive ENT - no sinus tenderness, no stridor Cardiac - regular rate/rhythm, no murmur Chest - equal breath sounds b/l, no wheezing or rales Abdomen - soft, mild epigastric discomfort with palpation Extremities - no cyanosis, clubbing, or edema, AV fistula in Lt arm Skin - no rashes Neuro - normal strength, moves extremities, follows commands Psych - normal mood and behavior   Resolved Hospital Problem list     Assessment & Plan:   Bringing up blood. - her history and clinical exam are more consistent with a GI source of bleeding - primary team to consult gastroenterology and start protonix infusion - f/u Hb/Hct and transfuse if < 7 - would keep NPO pending GI evaluation - seems less likely that she is having hemoptysis; however, if GI assessment is unrevealing,  then she might need to undergo bronchoscopy  Lung nodules. - don't think these are causing any of her symptoms at this time - she will need radiographic follow with non contrast CT chest in October 2023  Dyspnea with interstitial edema. - suspect this is related to acute on chronic diastolic CHF in setting of ESRD - f/u CXR after she has dialysis again  ESRD on HD M/W/F. - primary team  consulting nephrology  Acute on chronic diastolic CHF, Hypertension, Hyperlipidemia. - family reports her systolic BP is normal in 170's to 180's - per primary team  Pericardial effusion. - repeat Echocardiogram  Diabetes mellitus type 2. - per primary team  Best Practice (right click and "Reselect all SmartList Selections" daily)   Diet/type: NPO DVT prophylaxis: SCD GI prophylaxis: PPI Lines: N/A Foley:  N/A Code Status:  full code Last date of multidisciplinary goals of care discussion [Updated pt's family at bedside in ER]  Labs   CBC: Recent Labs  Lab 09/21/21 1007  WBC 8.9  NEUTROABS 6.8  HGB 9.0*  HCT 29.5*  MCV 91.0  PLT 140*    Basic Metabolic Panel: Recent Labs  Lab 09/21/21 1007  NA 138  K 4.3  CL 98  CO2 27  GLUCOSE 95  BUN 29*  CREATININE 7.14*  CALCIUM 9.2   GFR: Estimated Creatinine Clearance: 7.3 mL/min (A) (by C-G formula based on SCr of 7.14 mg/dL (H)). Recent Labs  Lab 09/21/21 1007  WBC 8.9    Liver Function Tests: Recent Labs  Lab 09/21/21 1007  AST 16  ALT 11  ALKPHOS 79  BILITOT 0.9  PROT 7.5  ALBUMIN 3.8   Recent Labs  Lab 09/21/21 1007  LIPASE 40   No results for input(s): AMMONIA in the last 168 hours.  ABG    Component Value Date/Time   TCO2 20 (L) 08/05/2020 0928     Coagulation Profile: Recent Labs  Lab 09/21/21 1007  INR 1.0    Cardiac Enzymes: No results for input(s): CKTOTAL, CKMB, CKMBINDEX, TROPONINI in the last 168 hours.  HbA1C: Hemoglobin A1C  Date/Time Value Ref Range Status  04/06/2019 11:40 AM 6.7 (A) 4.0 - 5.6 % Final  01/04/2018 10:46 AM 8.8 (A) 4.0 - 5.6 % Final   Hgb A1c MFr Bld  Date/Time Value Ref Range Status  12/29/2019 09:33 AM 5.5 4.8 - 5.6 % Final    Comment:    (NOTE) Pre diabetes:          5.7%-6.4%  Diabetes:              >6.4%  Glycemic control for   <7.0% adults with diabetes     CBG: No results for input(s): GLUCAP in the last 168 hours.  Review of  Systems:   Reviewed and negative  Past Medical History:  She,  has a past medical history of Anemia, Anxiety, Arthritis, Diabetes mellitus without complication (HCC), Heart murmur, History of blood transfusion, Hyperlipidemia, Hypertension, Pneumonia, PONV (postoperative nausea and vomiting), and Renal disorder.   Surgical History:   Past Surgical History:  Procedure Laterality Date   AV FISTULA PLACEMENT Left 05/20/2020   Procedure: INSERTION OF LEFT ARM ARTERIOVENOUS (AV) FISTULA;  Surgeon: Cephus Shelling, MD;  Location: [facility] OR;  Service: Vascular;  Laterality: Left;   BASCILIC VEIN TRANSPOSITION Left 08/05/2020   Procedure: SECOND STAGE BASILIC VEIN TRANSPOSITION;  Surgeon: Cephus Shelling, MD;  Location: [facility] OR;  Service: Vascular;  Laterality: Left;   CESAREAN SECTION  CHOLECYSTECTOMY     INSERTION OF DIALYSIS CATHETER N/A 05/20/2020   Procedure: INSERTION OF DIALYSIS CATHETER;  Surgeon: Cephus Shelling, MD;  Location[facility];  Service: Vascular;  Laterality: N/A;     Social History:   reports that she has never smoked. She has never used smokeless tobacco. She reports that she does not drink alcohol and does not use drugs.   Family History:  Her family history includes Diabetes in her brother, brother, mother, and sister; Hypertension in her father.   Allergies No Known Allergies   Home Medications  Prior to Admission medications   Medication Sig Start Date End Date Taking? Authorizing Provider  acetaminophen (TYLENOL) 325 MG tablet Take 2 tablets (650 mg total) by mouth every 6 (six) hours as needed for mild pain or moderate pain. 09/05/21   Renne Crigler, PA-C  amLODipine (NORVASC) 10 MG tablet Take 10 mg by mouth daily.    [provider]  atorvastatin (LIPITOR) 20 MG tablet Take 1 tablet (20 mg total) by mouth daily. 01/01/20   Marinda Elk, MD  diclofenac Sodium (VOLTAREN) 1 % GEL Apply 4 g topically 4 (four) times daily. Patient taking  differently: Apply 4 g topically daily as needed (Pain). 06/18/20   Just, Azalee Course, FNP  docusate sodium (COLACE) 100 MG capsule Take 1-2 capsules (100-200 mg total) by mouth 2 (two) times daily. Start with twice a day once having regular BM switch to daily Patient taking differently: Take 100 mg by mouth daily. 05/15/20   Just, Azalee Course, FNP  escitalopram (LEXAPRO) 10 MG tablet Take 1 tablet (10 mg total) by mouth daily. 07/23/20 10/21/20  Georgina Quint, MD  furosemide (LASIX) 80 MG tablet Take 1 tablet (80 mg total) by mouth 2 (two) times daily. 01/01/20   Marinda Elk, MD  labetalol (NORMODYNE) 200 MG tablet Take 200 mg by mouth 2 (two) times daily.    [provider]  linagliptin (TRADJENTA) 5 MG TABS tablet Take 5 mg by mouth daily.    [provider]  ondansetron (ZOFRAN-ODT) 4 MG disintegrating tablet Take 1 tablet (4 mg total) by mouth every 8 (eight) hours as needed for nausea or vomiting. 09/05/21   Renne Crigler, PA-C  polyethylene glycol powder (GLYCOLAX/MIRALAX) 17 GM/SCOOP powder Take 17 g by mouth 2 (two) times daily as needed. 05/15/20   Just, Azalee Course, FNP  sitaGLIPtin (JANUVIA) 25 MG tablet Take 1 tablet (25 mg total) by mouth daily. 07/23/20 10/21/20  Georgina Quint, MD     Signature:  Coralyn Helling, MD [facility] Pulmonary/Critical Care Pager - [phone] 09/21/2021, 4:48 PM

## 2021-09-21 NOTE — Plan of Care (Signed)
?  Problem: Clinical Measurements: ?Goal: Respiratory complications will improve ?Outcome: Progressing ?  ?Problem: Clinical Measurements: ?Goal: Cardiovascular complication will be avoided ?Outcome: Not Progressing ?  ?

## 2021-09-21 NOTE — ED Notes (Signed)
Patient transported to CT 

## 2021-09-21 NOTE — Consult Note (Signed)
Renal Service ?Consult Note ?[facility] ? ?Rachael Burke [place] ?09/21/2021 ?Sol Blazing, MD ?Requesting Physician: Dr. Roosevelt Locks ? ?Reason for Consult: ESRD pt coughing up blood ?HPI: The patient is a 66 y.o. year-old w/ hx of DM2, HTN, HL, PNA, ESRD on HD presents to ED w/ several episodes of coughing up blood. 3-4 times, a tsp or tbsp each time. Also some N/V and epigastric pain for several days. No cough or fevers. In ED VVS, BP's high, HR 70s and RR 18 afeb. Creat 7, BUN 29 K+ 4.3 alb 3.8, LFT's wnl. Hb 9.0, WBC 8.9k  plt 140. CTA chest neg for PE, minimally + GG changes in upper lobes. CXR clear. Pt admitted for hematemesis. Asked to see for ESRD.   ? ?Pt seen in room w/ interpreter. Started HD in Dec 2022, using LUA AVF.  Lives w/ her daughter in [place].  ? ?ROS - denies CP, no joint pain, no HA, no blurry vision, no rash, no diarrhea ? ? ?Past Medical History  ?Past Medical History:  ?Diagnosis Date  ? Anemia   ? Anxiety   ? Arthritis   ? Diabetes mellitus without complication ([place])   ? Heart murmur   ? echo 07/02/20: Mild MR/TR, mild-moderate AV sclerosis, bicuspid or functional bicuspid AV, no evidence of AS. Murmr felt due to AV.  ? History of blood transfusion   ? Hyperlipidemia   ? Hypertension   ? Pneumonia   ? PONV (postoperative nausea and vomiting)   ? Renal disorder   ? ?Past Surgical History  ?Past Surgical History:  ?Procedure Laterality Date  ? AV FISTULA PLACEMENT Left 05/20/2020  ? Procedure: INSERTION OF LEFT ARM ARTERIOVENOUS (AV) FISTULA;  Surgeon: Marty Heck, MD;  Location: [facility];  Service: Vascular;  Laterality: Left;  ? BASCILIC VEIN TRANSPOSITION Left 08/05/2020  ? Procedure: SECOND STAGE BASILIC VEIN TRANSPOSITION;  Surgeon: Marty Heck, MD;  Location: [facility];  Service: Vascular;  Laterality: Left;  ? CESAREAN SECTION    ? CHOLECYSTECTOMY    ? INSERTION OF DIALYSIS CATHETER N/A 05/20/2020  ? Procedure: INSERTION OF DIALYSIS CATHETER;  Surgeon: Marty Heck, MD;  Location: [facility];  Service: Vascular;  Laterality: N/A;  ? ?Family History  ?Family History  ?Problem Relation Age of Onset  ? Diabetes Mother   ? Hypertension Father   ? Diabetes Sister   ? Diabetes Brother   ? Diabetes Brother   ? ?Social History  reports that she has never smoked. She has never used smokeless tobacco. She reports that she does not drink alcohol and does not use drugs. ?Allergies No Known Allergies ?Home medications ?Prior to Admission medications   ?Medication Sig Start Date End Date Taking? Authorizing Provider  ?acetaminophen (TYLENOL) 325 MG tablet Take 2 tablets (650 mg total) by mouth every 6 (six) hours as needed for mild pain or moderate pain. 09/05/21   Carlisle Cater, PA-C  ?amLODipine (NORVASC) 10 MG tablet Take 10 mg by mouth daily.    [provider]  ?atorvastatin (LIPITOR) 20 MG tablet Take 1 tablet (20 mg total) by mouth daily. 01/01/20   Charlynne Cousins, MD  ?diclofenac Sodium (VOLTAREN) 1 % GEL Apply 4 g topically 4 (four) times daily. ?Patient taking differently: Apply 4 g topically daily as needed (Pain). 06/18/20   Just, Laurita Quint, FNP  ?docusate sodium (COLACE) 100 MG capsule Take 1-2 capsules (100-200 mg total) by mouth 2 (two) times daily. Start with twice  a day once having regular BM switch to daily ?Patient taking differently: Take 100 mg by mouth daily. 05/15/20   Just, Laurita Quint, FNP  ?escitalopram (LEXAPRO) 10 MG tablet Take 1 tablet (10 mg total) by mouth daily. 07/23/20 10/21/20  Horald Pollen, MD  ?furosemide (LASIX) 80 MG tablet Take 1 tablet (80 mg total) by mouth 2 (two) times daily. 01/01/20   Charlynne Cousins, MD  ?labetalol (NORMODYNE) 200 MG tablet Take 200 mg by mouth 2 (two) times daily.    [provider]  ?linagliptin (TRADJENTA) 5 MG TABS tablet Take 5 mg by mouth daily.    [provider]  ?ondansetron (ZOFRAN-ODT) 4 MG disintegrating tablet Take 1 tablet (4 mg total) by mouth every 8 (eight) hours as  needed for nausea or vomiting. 09/05/21   Carlisle Cater, PA-C  ?polyethylene glycol powder (GLYCOLAX/MIRALAX) 17 GM/SCOOP powder Take 17 g by mouth 2 (two) times daily as needed. 05/15/20   Just, Laurita Quint, FNP  ?sitaGLIPtin (JANUVIA) 25 MG tablet Take 1 tablet (25 mg total) by mouth daily. 07/23/20 10/21/20  Horald Pollen, MD  ? ? ? ?Vitals:  ? 09/21/21 1530 09/21/21 1545 09/21/21 1645 09/21/21 1831  ?BP: (!) 192/77 (!) 199/73 (!) 184/100 (!) 206/82  ?Pulse: 75 73 78 79  ?Resp: 16 17 (!) 22 16  ?Temp:    98.2 ?F (36.8 ?C)  ?TempSrc:    Oral  ?SpO2: 93% 91% 91% 98%  ?Weight:      ?Height:      ? ?Exam ?Gen alert, no distress, older adult female ?No rash, cyanosis or gangrene ?Sclera anicteric, throat clear  ?No jvd or bruits ?Chest crackles R base, L clear ?RRR no RG ?Abd soft ntnd no mass or ascites +bs ?GU defer ?MS no joint effusions or deformity ?Ext no LE or UE edema, no wounds or ulcers ?Neuro is alert, Ox 3 , nf ?   LUA aVF+bruit ? ? Home meds include - norvasc 10, lipitor, lexapro, lasix 80 bid, labetalol 200 bid, tradjenta 5, januvia 25, prns/ vits/ supps ? ? ? ? OP HD: MWF HD  LUA AVF started dec 2022 ? - get records ?   ?   CXR 4/23- IMPRESSION: Cardiomegaly with pulmonary vascular congestion and equivocal mild interstitial opacities/edema. ? ? ? ?Assessment/ Plan: ?Hematemesis - vs hemoptysis. Seen by CCM in consultation. GI also being consulted. Hb stable around 9, not sure baseline.  ?ESRD - on HD MWF. Last HD Friday. Next HD tomorrow on schedule.  ?HTN - uncontrolled, unable to swallow pills due to nausea 1-2 days. Cont home meds when able. Will start scheduled IV metoprolol 5 mg q 6 hrs for now.  ?Volume - slight rales and early edema by CXR. On room air. No urgent need for HD. Plan HD 1st shift tomorrow w/ large UF goal.  ?DM2 - per pmd ?Anemia esrd - Hb 8.6, 9.0 here, get records ?MBD ckd - CCa in range, will add on phos, get records.  ?  ? ? ? ?Kelly Splinter  MD ?09/21/2021, 7:35 PM ?Recent Labs   ?Lab 09/21/21 ?1007  ?HGB 8.6*  9.0*  ?ALBUMIN 3.8  ?CALCIUM 9.2  ?CREATININE 7.14*  ?K 4.3  ? ? ?

## 2021-09-21 NOTE — ED Notes (Signed)
ED TO INPATIENT HANDOFF REPORT ? ?ED Nurse Name and Phone #: Isyss Espinal, [phone] ? ?S ?Name/Age/Gender ?Rachael Burke ?66 y.o. ?female ?Room/Bed: RESUSC/RESUSC ? ?Code Status ?  Code Status: Full Code ? ?Home/SNF/Other ?Home ?Patient oriented to: self, place, time, and situation ?Is this baseline? Yes  ? ?Triage Complete: Triage complete  ?Chief Complaint ?Hemoptysis [R04.2] ? ?Triage Note ?Pt reports bloody emesis x3 yesterday and x2 overnight. Also endorses SOB overnight. Last HD Friday. Pt has a tightness in her abd.   ? ?Allergies ?No Known Allergies ? ?Level of Care/Admitting Diagnosis ?ED Disposition   ? ? ED Disposition  ?Admit  ? Condition  ?--  ? Comment  ?Hospital Area: [facility] [[id number]] ? Level of Care: Telemetry Medical [104] ? May place patient in observation at [facility] or [place] if equivalent level of care is available:: No ? Covid Evaluation: Asymptomatic - no recent exposure (last 10 days) testing not required ? Diagnosis: Hemoptysis [741752] ? Admitting Physician: Lequita Halt [[id number]] ? Attending Physician: Lequita Halt [[id number]] ?  ?  ? ?  ? ? ?B ?Medical/Surgery History ?Past Medical History:  ?Diagnosis Date  ? Anemia   ? Anxiety   ? Arthritis   ? Diabetes mellitus without complication ([place])   ? Heart murmur   ? echo 07/02/20: Mild MR/TR, mild-moderate AV sclerosis, bicuspid or functional bicuspid AV, no evidence of AS. Murmr felt due to AV.  ? History of blood transfusion   ? Hyperlipidemia   ? Hypertension   ? Pneumonia   ? PONV (postoperative nausea and vomiting)   ? Renal disorder   ? ?Past Surgical History:  ?Procedure Laterality Date  ? AV FISTULA PLACEMENT Left 05/20/2020  ? Procedure: INSERTION OF LEFT ARM ARTERIOVENOUS (AV) FISTULA;  Surgeon: Marty Heck, MD;  Location: [place];  Service: Vascular;  Laterality: Left;  ? BASCILIC VEIN TRANSPOSITION Left 08/05/2020  ? Procedure: SECOND STAGE BASILIC VEIN TRANSPOSITION;  Surgeon: Marty Heck,  MD;  Location: [place];  Service: Vascular;  Laterality: Left;  ? CESAREAN SECTION    ? CHOLECYSTECTOMY    ? INSERTION OF DIALYSIS CATHETER N/A 05/20/2020  ? Procedure: INSERTION OF DIALYSIS CATHETER;  Surgeon: Marty Heck, MD;  Location: [place];  Service: Vascular;  Laterality: N/A;  ?  ? ?A ?IV Location/Drains/Wounds ?Patient Lines/Drains/Airways Status   ? ? Active Line/Drains/Airways   ? ? Name Placement date Placement time Site Days  ? Peripheral IV 09/21/21 20 G Right Forearm 09/21/21  1039  Forearm  less than 1  ? Fistula / Graft Left Upper arm Arteriovenous fistula 05/20/20  1050  Upper arm  489  ? Hemodialysis Catheter Right Internal jugular Double lumen Permanent (Tunneled) 05/20/20  0955  Internal jugular  489  ? Incision (Closed) 05/20/20 Arm Left 05/20/20  1107  -- 489  ? Incision (Closed) 05/20/20 Chest Right 05/20/20  1107  -- 489  ? Incision (Closed) 08/05/20 Arm Left 08/05/20  1037  -- 412  ? ?  ?  ? ?  ? ? ?Intake/Output Last 24 hours ?No intake or output data in the 24 hours ending 09/21/21 1715 ? ?Labs/Imaging ?Results for orders placed or performed during the hospital encounter of 09/21/21 (from the past 48 hour(s))  ?Comprehensive metabolic panel     Status: Abnormal  ? Collection Time: 09/21/21 10:07 AM  ?Result Value Ref Range  ? Sodium 138 135 - 145 mmol/L  ? Potassium 4.3 3.5 -  5.1 mmol/L  ? Chloride 98 98 - 111 mmol/L  ? CO2 27 22 - 32 mmol/L  ? Glucose, Bld 95 70 - 99 mg/dL  ?  Comment: Glucose reference range applies only to samples taken after fasting for at least 8 hours.  ? BUN 29 (H) 8 - 23 mg/dL  ? Creatinine, Ser 7.14 (H) 0.44 - 1.00 mg/dL  ? Calcium 9.2 8.9 - 10.3 mg/dL  ? Total Protein 7.5 6.5 - 8.1 g/dL  ? Albumin 3.8 3.5 - 5.0 g/dL  ? AST 16 15 - 41 U/L  ? ALT 11 0 - 44 U/L  ? Alkaline Phosphatase 79 38 - 126 U/L  ? Total Bilirubin 0.9 0.3 - 1.2 mg/dL  ? GFR, Estimated 6 (L) >60 mL/min  ?  Comment: (NOTE) ?Calculated using the CKD-EPI Creatinine Equation (2021) ?  ? Anion  gap 13 5 - 15  ?  Comment: Performed at [facility], [place] [address], [place], [place] [address]  ?CBC with Differential     Status: Abnormal  ? Collection Time: 09/21/21 10:07 AM  ?Result Value Ref Range  ? WBC 8.9 4.0 - 10.5 K/uL  ? RBC 3.24 (L) 3.87 - 5.11 MIL/uL  ? Hemoglobin 9.0 (L) 12.0 - 15.0 g/dL  ? HCT 29.5 (L) 36.0 - 46.0 %  ? MCV 91.0 80.0 - 100.0 fL  ? MCH 27.8 26.0 - 34.0 pg  ? MCHC 30.5 30.0 - 36.0 g/dL  ? RDW 17.2 (H) 11.5 - 15.5 %  ? Platelets 140 (L) 150 - 400 K/uL  ? nRBC 0.2 0.0 - 0.2 %  ? Neutrophils Relative % 76 %  ? Neutro Abs 6.8 1.7 - 7.7 K/uL  ? Lymphocytes Relative 12 %  ? Lymphs Abs 1.0 0.7 - 4.0 K/uL  ? Monocytes Relative 8 %  ? Monocytes Absolute 0.7 0.1 - 1.0 K/uL  ? Eosinophils Relative 2 %  ? Eosinophils Absolute 0.2 0.0 - 0.5 K/uL  ? Basophils Relative 1 %  ? Basophils Absolute 0.0 0.0 - 0.1 K/uL  ? Immature Granulocytes 1 %  ? Abs Immature Granulocytes 0.05 0.00 - 0.07 K/uL  ?  Comment: Performed at [facility], [place] [address], [place], [place] [address]  ?Protime-INR     Status: None  ? Collection Time: 09/21/21 10:07 AM  ?Result Value Ref Range  ? Prothrombin Time 13.3 11.4 - 15.2 seconds  ? INR 1.0 0.8 - 1.2  ?  Comment: (NOTE) ?INR goal varies based on device and disease states. ?Performed at [facility], [place] [address] [place], [place] ?[address] ?  ?Lipase, blood     Status: None  ? Collection Time: 09/21/21 10:07 AM  ?Result Value Ref Range  ? Lipase 40 11 - 51 U/L  ?  Comment: Performed at [facility], [place] [address], [place], [place] [address]  ?Troponin I (High Sensitivity)     Status: None  ? Collection Time: 09/21/21 10:07 AM  ?Result Value Ref Range  ? Troponin I (High Sensitivity) 8 <18 ng/L  ?  Comment: (NOTE) ?Elevated high sensitivity troponin I (hsTnI) values and significant  ?changes across serial measurements may suggest ACS but many other  ?chronic and acute conditions are known to elevate hsTnI results.  ?Refer to the "Links"  section for chest pain algorithms and additional  ?guidance. ?Performed at [facility], [place] [address] [place], [place] ?[address] ?  ?Troponin I (High Sensitivity)  Status: None  ? Collection Time: 09/21/21 10:07 AM  ?Result Value Ref Range  ? Troponin I (High Sensitivity) 7 <18 ng/L  ?  Comment: (NOTE) ?Elevated high sensitivity troponin I (hsTnI) values and significant  ?changes across serial measurements may suggest ACS but many other  ?chronic and acute conditions are known to elevate hsTnI results.  ?Refer to the "Links" section for chest pain algorithms and additional  ?guidance. ?Performed at [facility], [place] [address], [place], [place] ?[address] ?  ?Type and screen [place]     Status: None  ? Collection Time: 09/21/21 10:50 AM  ?Result Value Ref Range  ? ABO/RH(D) A POS   ? Antibody Screen NEG   ? Sample Expiration    ?  09/24/2021,2359 ?Performed at [facility], [place] [address], [place], [place] [address] ?  ?POC occult blood, ED     Status: None  ? Collection Time: 09/21/21 11:11 AM  ?Result Value Ref Range  ? Fecal Occult Bld NEGATIVE NEGATIVE  ? ?CT Angio Chest PE W and/or Wo Contrast ? ?Result Date: 09/21/2021 ?CLINICAL DATA:  High probability of pulmonary embolism. EXAM: CT ANGIOGRAPHY CHEST WITH CONTRAST TECHNIQUE: Multidetector CT imaging of the chest was performed using the standard protocol during bolus administration of intravenous contrast. Multiplanar CT image reconstructions and MIPs were obtained to evaluate the vascular anatomy. RADIATION DOSE REDUCTION: This exam was performed according to the departmental dose-optimization program which includes automated exposure control, adjustment of the mA and/or kV according to patient size and/or use of iterative reconstruction technique. CONTRAST:  192mL OMNIPAQUE IOHEXOL 350 MG/ML SOLN COMPARISON:  Chest radiograph dated September 21, 2021 FINDINGS: Cardiovascular: Satisfactory opacification of the  pulmonary arteries to the segmental level. No evidence of pulmonary embolism. Normal heart size. No pericardial effusion. Enlarged heart. Pericardial effusion measuring 9 mm in greatest thickness. Calcific atheroscl

## 2021-09-22 ENCOUNTER — Other Ambulatory Visit (HOSPITAL_COMMUNITY): Payer: Medicare Other

## 2021-09-22 ENCOUNTER — Observation Stay (HOSPITAL_BASED_OUTPATIENT_CLINIC_OR_DEPARTMENT_OTHER): Payer: Medicare Other

## 2021-09-22 ENCOUNTER — Observation Stay (HOSPITAL_COMMUNITY): Payer: Medicare Other | Admitting: Certified Registered Nurse Anesthetist

## 2021-09-22 ENCOUNTER — Encounter (HOSPITAL_COMMUNITY)
Admission: EM | Disposition: A | Discharge status: Home / Self Care | Payer: Self-pay | Source: Home / Self Care | Attending: Emergency Medicine

## 2021-09-22 ENCOUNTER — Observation Stay (HOSPITAL_BASED_OUTPATIENT_CLINIC_OR_DEPARTMENT_OTHER): Payer: Medicare Other | Admitting: Certified Registered Nurse Anesthetist

## 2021-09-22 ENCOUNTER — Encounter (HOSPITAL_COMMUNITY): Payer: Self-pay | Admitting: Internal Medicine

## 2021-09-22 DIAGNOSIS — I3139 Other pericardial effusion (noninflammatory): Secondary | ICD-10-CM

## 2021-09-22 DIAGNOSIS — K319 Disease of stomach and duodenum, unspecified: Secondary | ICD-10-CM | POA: Diagnosis not present

## 2021-09-22 DIAGNOSIS — K920 Hematemesis: Secondary | ICD-10-CM | POA: Diagnosis not present

## 2021-09-22 DIAGNOSIS — J810 Acute pulmonary edema: Secondary | ICD-10-CM

## 2021-09-22 DIAGNOSIS — E1122 Type 2 diabetes mellitus with diabetic chronic kidney disease: Secondary | ICD-10-CM | POA: Diagnosis not present

## 2021-09-22 DIAGNOSIS — K2951 Unspecified chronic gastritis with bleeding: Secondary | ICD-10-CM | POA: Diagnosis not present

## 2021-09-22 DIAGNOSIS — I120 Hypertensive chronic kidney disease with stage 5 chronic kidney disease or end stage renal disease: Secondary | ICD-10-CM

## 2021-09-22 DIAGNOSIS — R112 Nausea with vomiting, unspecified: Secondary | ICD-10-CM | POA: Diagnosis not present

## 2021-09-22 DIAGNOSIS — K3189 Other diseases of stomach and duodenum: Secondary | ICD-10-CM | POA: Diagnosis not present

## 2021-09-22 DIAGNOSIS — Z7984 Long term (current) use of oral hypoglycemic drugs: Secondary | ICD-10-CM | POA: Diagnosis not present

## 2021-09-22 DIAGNOSIS — N186 End stage renal disease: Secondary | ICD-10-CM

## 2021-09-22 DIAGNOSIS — D631 Anemia in chronic kidney disease: Secondary | ICD-10-CM | POA: Diagnosis not present

## 2021-09-22 DIAGNOSIS — R160 Hepatomegaly, not elsewhere classified: Secondary | ICD-10-CM | POA: Diagnosis not present

## 2021-09-22 DIAGNOSIS — R042 Hemoptysis: Secondary | ICD-10-CM | POA: Diagnosis not present

## 2021-09-22 DIAGNOSIS — Z992 Dependence on renal dialysis: Secondary | ICD-10-CM

## 2021-09-22 DIAGNOSIS — K254 Chronic or unspecified gastric ulcer with hemorrhage: Secondary | ICD-10-CM | POA: Diagnosis not present

## 2021-09-22 HISTORY — PX: BIOPSY: SHX5522

## 2021-09-22 HISTORY — PX: ESOPHAGOGASTRODUODENOSCOPY (EGD) WITH PROPOFOL: SHX5813

## 2021-09-22 LAB — CBC
HCT: 26.9 % — ABNORMAL LOW (ref 36.0–46.0)
Hemoglobin: 8.4 g/dL — ABNORMAL LOW (ref 12.0–15.0)
MCH: 28.1 pg (ref 26.0–34.0)
MCHC: 31.2 g/dL (ref 30.0–36.0)
MCV: 90 fL (ref 80.0–100.0)
Platelets: 113 10*3/uL — ABNORMAL LOW (ref 150–400)
RBC: 2.99 MIL/uL — ABNORMAL LOW (ref 3.87–5.11)
RDW: 17.2 % — ABNORMAL HIGH (ref 11.5–15.5)
WBC: 9.4 10*3/uL (ref 4.0–10.5)
nRBC: 0 % (ref 0.0–0.2)

## 2021-09-22 LAB — ECHOCARDIOGRAM COMPLETE
AR max vel: 1.72 cm2
AV Peak grad: 16.1 mmHg
Ao pk vel: 2.01 m/s
Area-P 1/2: 4.93 cm2
Height: 63 in
S' Lateral: 3 cm
Weight: 2345.69 oz

## 2021-09-22 LAB — BASIC METABOLIC PANEL
Anion gap: 10 (ref 5–15)
BUN: 34 mg/dL — ABNORMAL HIGH (ref 8–23)
CO2: 28 mmol/L (ref 22–32)
Calcium: 8.6 mg/dL — ABNORMAL LOW (ref 8.9–10.3)
Chloride: 99 mmol/L (ref 98–111)
Creatinine, Ser: 8.44 mg/dL — ABNORMAL HIGH (ref 0.44–1.00)
GFR, Estimated: 5 mL/min — ABNORMAL LOW (ref >60)
Glucose, Bld: 88 mg/dL (ref 70–99)
Potassium: 4.8 mmol/L (ref 3.5–5.1)
Sodium: 137 mmol/L (ref 135–145)

## 2021-09-22 LAB — GLUCOSE, CAPILLARY
Glucose-Capillary: 64 mg/dL — ABNORMAL LOW (ref 70–99)
Glucose-Capillary: 102 mg/dL — ABNORMAL HIGH (ref 70–99)
Glucose-Capillary: 99 mg/dL (ref 70–99)

## 2021-09-22 SURGERY — ESOPHAGOGASTRODUODENOSCOPY (EGD) WITH PROPOFOL
Anesthesia: Monitor Anesthesia Care

## 2021-09-22 MED ORDER — HYDRALAZINE HCL 20 MG/ML IJ SOLN: 5.0000 mg | INTRAMUSCULAR | Status: DC | PRN

## 2021-09-22 MED ORDER — PROPOFOL 10 MG/ML IV BOLUS
INTRAVENOUS | Status: DC | PRN
  Administered 2021-09-22: 10 mg via INTRAVENOUS
  Administered 2021-09-22 (×2): 20 mg via INTRAVENOUS

## 2021-09-22 MED ORDER — LIDOCAINE 2% (20 MG/ML) 5 ML SYRINGE
INTRAMUSCULAR | Status: DC | PRN
  Administered 2021-09-22: 60 mg via INTRAVENOUS

## 2021-09-22 MED ORDER — SODIUM CHLORIDE 0.9 % IV SOLN
INTRAVENOUS | Status: AC | PRN
Start: 2021-09-22 — End: 2021-09-22
  Administered 2021-09-22: 500 mL via INTRAMUSCULAR

## 2021-09-22 MED ORDER — PROPOFOL 500 MG/50ML IV EMUL
INTRAVENOUS | Status: DC | PRN
  Administered 2021-09-22: 150 ug/kg/min via INTRAVENOUS

## 2021-09-22 MED ORDER — DOXERCALCIFEROL 4 MCG/2ML IV SOLN
9.0000 ug | INTRAVENOUS | Status: DC
Start: 2021-09-22 — End: 2021-09-23
  Administered 2021-09-22: 9 ug via INTRAVENOUS
  Filled 2021-09-22: qty 6

## 2021-09-22 SURGICAL SUPPLY — 15 items

## 2021-09-22 NOTE — Consult Note (Addendum)
? ?                                                                          [facility] Consult: ?9:11 AM ?09/22/2021 ? LOS: 0 days  ? ? ?Referring Provider: Dr Waldron Labs.    ?Primary Care Physician:  Trey Sailors, PA ?Primary Gastroenterologist:  unassigned  ? ? ? ?Reason for Consultation: Hematemesis, hemoptysis, anemia. ?  ?HPI: Rachael Burke is a 66 y.o. female.  Spanish speaking patient originally from [place].  Lives with her daughter in [place].  Anemia with Hb as low as 6.3 on 07/18/2021 and was transfused with 1 PRBC at [facility] ER that same day.  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 ESRD on hemodialysis MWF.  Heart murmur.  No advanced valvular disease by echo of 07/2020.  Colonoscopy around 2020 in [place] was unremarkable per patient and daughters reports/recall. ? ?Beginning 3 weeks ago she had occurrence of vomiting, difficulty urinating, constipation and weakness that lasted for about 3 days.  Event precipitated by eating shrimp.  Symptoms persisted where she had a globus sensation as well as epigastric sensation that was perhaps at most mildly painful.  She was seen and had CT scan abdomen pelvis April 7 which was negative for acute findings, however it did show hepatomegaly without obvious cirrhosis or liver lesions.  Small volume of ascites present also seen was cardiomegaly with small pericardial effusion, small pleural effusions.  Some patchy changes in the lower lungs suggested atelectasis versus pneumonia.  Discharged home.  Within a few days her PCP started sucralfate but symptoms persisted.  On 4/22 she developed acute nausea and vomiting and was spitting up fresh blood.  This happened 3 times on Saturday, and 3 times on Sunday before she got to the ER.  No reports of melena or bleeding per rectum.  Generally has a good appetite though it was reduced during the week after the episode  of eating shrimp precipitating GI symptoms.  Was prescribed esomeprazole on Saturday but no PPI or H2 blocker previously. ? ?Labs thus far reveal Hb 8.4, platelets 113, MCV 90.  Normal WBC expected derangements in BUN/creatinine.  Platelets in early April and in February normal.    ?The last time iron levels were checked in 04/2019 she had low iron and low TIBC but normal ferritin, folate and elevated B12 ? ?Family history negative for ulcer disease, colorectal cancer, gastric cancers, esophageal issues, liver issues. ?Lives with her daughter in [place]. Martiza Burke [phone].   ?  ? ?Past Medical History:  ?Diagnosis Date  ? Anemia   ? Anxiety   ? Arthritis   ? Diabetes mellitus without complication ([place])   ? Heart murmur   ? echo 07/02/20: Mild MR/TR, mild-moderate AV sclerosis, bicuspid or functional bicuspid AV, no evidence of AS. Murmr felt due to AV.  ? History of blood transfusion   ? Hyperlipidemia   ? Hypertension   ? Pneumonia   ? PONV (postoperative nausea and vomiting)   ? Renal disorder   ? ? ?Past Surgical History:  ?Procedure Laterality Date  ? AV FISTULA PLACEMENT Left 05/20/2020  ? Procedure: INSERTION OF LEFT ARM ARTERIOVENOUS (AV) FISTULA;  Surgeon: Marty Heck,  MD;  Location: [place];  Service: Vascular;  Laterality: Left;  ? BASCILIC VEIN TRANSPOSITION Left 08/05/2020  ? Procedure: SECOND STAGE BASILIC VEIN TRANSPOSITION;  Surgeon: Marty Heck, MD;  Location: [place];  Service: Vascular;  Laterality: Left;  ? CESAREAN SECTION    ? CHOLECYSTECTOMY    ? INSERTION OF DIALYSIS CATHETER N/A 05/20/2020  ? Procedure: INSERTION OF DIALYSIS CATHETER;  Surgeon: Marty Heck, MD;  Location: [place];  Service: Vascular;  Laterality: N/A;  ? ? ?Prior to Admission medications   ?Medication Sig Start Date End Date Taking? Authorizing Provider  ?acetaminophen (TYLENOL) 325 MG tablet Take 2 tablets (650 mg total) by mouth every 6 (six) hours as needed for mild pain or moderate pain.  09/05/21   Carlisle Cater, PA-C  ?amLODipine (NORVASC) 10 MG tablet Take 10 mg by mouth daily.    [provider]  ?atorvastatin (LIPITOR) 20 MG tablet Take 1 tablet (20 mg total) by mouth daily. 01/01/20   Charlynne Cousins, MD  ?diclofenac Sodium (VOLTAREN) 1 % GEL Apply 4 g topically 4 (four) times daily. ?Patient taking differently: Apply 4 g topically daily as needed (Pain). 06/18/20   Just, Laurita Quint, FNP  ?docusate sodium (COLACE) 100 MG capsule Take 1-2 capsules (100-200 mg total) by mouth 2 (two) times daily. Start with twice a day once having regular BM switch to daily ?Patient taking differently: Take 100 mg by mouth daily. 05/15/20   Just, Laurita Quint, FNP  ?escitalopram (LEXAPRO) 10 MG tablet Take 1 tablet (10 mg total) by mouth daily. 07/23/20 10/21/20  Horald Pollen, MD  ?furosemide (LASIX) 80 MG tablet Take 1 tablet (80 mg total) by mouth 2 (two) times daily. 01/01/20   Charlynne Cousins, MD  ?labetalol (NORMODYNE) 200 MG tablet Take 200 mg by mouth 2 (two) times daily.    [provider]  ?linagliptin (TRADJENTA) 5 MG TABS tablet Take 5 mg by mouth daily.    [provider]  ?ondansetron (ZOFRAN-ODT) 4 MG disintegrating tablet Take 1 tablet (4 mg total) by mouth every 8 (eight) hours as needed for nausea or vomiting. 09/05/21   Carlisle Cater, PA-C  ?polyethylene glycol powder (GLYCOLAX/MIRALAX) 17 GM/SCOOP powder Take 17 g by mouth 2 (two) times daily as needed. 05/15/20   Just, Laurita Quint, FNP  ?sitaGLIPtin (JANUVIA) 25 MG tablet Take 1 tablet (25 mg total) by mouth daily. 07/23/20 10/21/20  Horald Pollen, MD  ? ? ?Scheduled Meds: ? amLODipine  10 mg Oral Daily  ? atorvastatin  20 mg Oral Daily  ? Chlorhexidine Gluconate Cloth  6 each Topical Q0600  ? docusate sodium  100-200 mg Oral BID  ? doxercalciferol  9 mcg Intravenous Q M,W,F-HD  ? insulin aspart  0-6 Units Subcutaneous TID WC  ? labetalol  200 mg Oral BID  ? linagliptin  5 mg Oral Daily  ? metoprolol tartrate   5 mg Intravenous Q6H  ? pantoprazole  40 mg Oral BID AC  ? ?Infusions: ? ?PRN Meds: ?acetaminophen, hydrALAZINE, ondansetron, polyethylene glycol powder ? ? ?Allergies as of 09/21/2021  ? (No Known Allergies)  ? ? ?Family History  ?Problem Relation Age of Onset  ? Diabetes Mother   ? Hypertension Father   ? Diabetes Sister   ? Diabetes Brother   ? Diabetes Brother   ? ? ?Social History  ? ?Socioeconomic History  ? Marital status: Married  ?  Spouse name: Not on file  ? Number of children:  Not on file  ? Years of education: Not on file  ? Highest education level: Not on file  ?Occupational History  ? Not on file  ?Tobacco Use  ? Smoking status: Never  ? Smokeless tobacco: Never  ?Vaping Use  ? Vaping Use: Never used  ?Substance and Sexual Activity  ? Alcohol use: Never  ? Drug use: Never  ? Sexual activity: Not on file  ?Other Topics Concern  ? Not on file  ?Social History Narrative  ? Not on file  ? ?Social Determinants of Health  ? ?Financial Resource Strain: Not on file  ?Food Insecurity: Not on file  ?Transportation Needs: Not on file  ?Physical Activity: Not on file  ?Stress: Not on file  ?Social Connections: Not on file  ?Intimate Partner Violence: Not on file  ? ? ?REVIEW OF SYSTEMS: ?Constitutional: Some fatigue but nothing profound and no profound weakness. ?ENT:  No nose bleeds ?Pulm: No difficulty breathing or cough ?CV:  No palpitations, no LE edema.  Reported angina. ?GU:  No hematuria, no frequency ?GI: See HPI. ?Heme: Other than the hematemesis, no reports of unusual or excessive bleeding or bruising. ?Transfusions: In February per HPI. ?Neuro:  No headaches, no peripheral tingling or numbness.  No dizziness, no syncope. ?Derm:  No itching, no rash or sores.  ?Endocrine:  No sweats or chills.  No polyuria or dysuria ?Immunization: Vaccination history reviewed. ?Travel:  None beyond local counties in last few months.  ? ? ?PHYSICAL EXAM: ?Vital signs in last 24 hours: ?Vitals:  ? 09/22/21 0754 09/22/21  0830  ?BP: (!) 169/74 (!) 169/74  ?Pulse: 74 68  ?Resp: 13 19  ?Temp:    ?SpO2:    ? ?Wt Readings from Last 3 Encounters:  ?09/22/21 69.5 kg  ?07/18/21 69.9 kg  ?08/27/20 69.9 kg  ? ? ?Dialysis nurse as

## 2021-09-22 NOTE — Interval H&P Note (Signed)
History and Physical Interval Note: ? ?09/22/2021 ?3:02 PM ? ?Rachael Burke  has presented today for surgery, with the diagnosis of Epigastric pain, ? hematemesis.  The various methods of treatment have been discussed with the patient and family. After consideration of risks, benefits and other options for treatment, the patient has consented to  Procedure(s): ?ESOPHAGOGASTRODUODENOSCOPY (EGD) WITH PROPOFOL (N/A) ?BIOPSY as a surgical intervention.  The patient's history has been reviewed, patient examined, no change in status, stable for surgery.  I have reviewed the patient's chart and labs.  Questions were answered to the patient's satisfaction.   ? ? ?Pricilla Riffle. Fuller Plan ? ? ?

## 2021-09-22 NOTE — Progress Notes (Signed)
PCCM Interval Progress Note ? ?Chart reviewed. Likely hematemesis and not hemoptysis but not 100% clear. ?GI has seen, planning for EGD this afternoon. ? ?If EGD negative and pt has ongoing symptoms, please call us back and we can re-evaluate and consider pursuing diagnostic bronchoscopy. ? ? ?Montey Hora, PA - C ?[facility] ?For pager details, please see AMION or use Epic chat  ?After 1900, please call [facility] for cross coverage needs ?09/22/2021, 10:04 AM ? ?

## 2021-09-22 NOTE — Progress Notes (Signed)
[facility] ?NEPHROLOGY PROGRESS NOTE ? ?Assessment/ Plan: ?Pt is a 66 y.o. yo female  w/ hx of DM2, HTN, HL, PNA, ESRD on HD presents to ED w/ several episodes of coughing up blood. ? ?OP HD orders:  ?[facility], MWF ?3.5 hrs, 2K, 2ca, 400/500, edw 69 kg, LUE AVF ?Mircera 150 mcg 4/19 ?Hectorol 9 mcg 3x/week ? ?# Hematemesis vs Hemopytsis: Seen by pulmonary thought to be mostly hematemesis, seems like GI was consulted.  Patient is still coughing up blood.  Per primary team, monitor hemoglobin. ? ?# ESRD MWF: Dialysis today, goal UF around 3 L, AV fistula for the access.  So far tolerating dialysis well.  Avoid heparin because of blood loss. ? ?# Anemia, complicated by blood loss via hemoptysis versus hematemesis: Received Mircera on 4/19.  Monitor hemoglobin and transfuse as needed. ? ?# Secondary hyperparathyroidism: Continue Hectorol, monitor calcium and phosphorus level. ? ?# HTN/volume: Resume amlodipine and labetalol.  Monitor blood pressure after ultrafiltration. ? ?Subjective: Seen and examined at the dialysis unit.  Patient is coughing up blood.  Tolerating dialysis well, BP elevated.  UF goal around 3 L.   ? ?Objective ?Vital signs in last 24 hours: ?Vitals:  ? 09/22/21 0555 09/22/21 0750 09/22/21 0754 09/22/21 0830  ?BP: (!) 149/58 (!) 145/76 (!) 169/74 (!) 169/74  ?Pulse: 69 76 74 68  ?Resp: 18 15 13 19   ?Temp: 98.6 ?F (37 ?C) 97.9 ?F (36.6 ?C)    ?TempSrc: Oral     ?SpO2: 91% 96%    ?Weight:  69.5 kg    ?Height:      ? ?Weight change:  ? ?Intake/Output Summary (Last 24 hours) at 09/22/2021 0852 ?Last data filed at 09/22/2021 0600 ?Gross per 24 hour  ?Intake 0 ml  ?Output 0 ml  ?Net 0 ml  ? ? ? ? ? ?Labs: ?Basic Metabolic Panel: ?Recent Labs  ?Lab 09/21/21 ?1007 09/21/21 ?2034 09/22/21 ?0330  ?NA 138  --  137  ?K 4.3  --  4.8  ?CL 98  --  99  ?CO2 27  --  28  ?GLUCOSE 95  --  88  ?BUN 29*  --  34*  ?CREATININE 7.14*  --  8.44*  ?CALCIUM 9.2  --  8.6*  ?PHOS  --  4.2  --   ? ?Liver  Function Tests: ?Recent Labs  ?Lab 09/21/21 ?1007  ?AST 16  ?ALT 11  ?ALKPHOS 79  ?BILITOT 0.9  ?PROT 7.5  ?ALBUMIN 3.8  ? ?Recent Labs  ?Lab 09/21/21 ?1007  ?LIPASE 40  ? ?No results for input(s): AMMONIA in the last 168 hours. ?CBC: ?Recent Labs  ?Lab 09/21/21 ?1007 09/22/21 ?0330  ?WBC 8.9 9.4  ?NEUTROABS 6.8  --   ?HGB 8.6*  9.0* 8.4*  ?HCT 28.2*  29.5* 26.9*  ?MCV 91.0 90.0  ?PLT 140* 113*  ? ?Cardiac Enzymes: ?No results for input(s): CKTOTAL, CKMB, CKMBINDEX, TROPONINI in the last 168 hours. ?CBG: ?Recent Labs  ?Lab 09/21/21 ?1724 09/21/21 ?2253  ?GLUCAP 107* 91  ? ? ?Iron Studies: No results for input(s): IRON, TIBC, TRANSFERRIN, FERRITIN in the last 72 hours. ?Studies/Results: ?CT Angio Chest PE W and/or Wo Contrast ? ?Result Date: 09/21/2021 ?CLINICAL DATA:  High probability of pulmonary embolism. EXAM: CT ANGIOGRAPHY CHEST WITH CONTRAST TECHNIQUE: Multidetector CT imaging of the chest was performed using the standard protocol during bolus administration of intravenous contrast. Multiplanar CT image reconstructions and MIPs were obtained to evaluate the vascular anatomy. RADIATION DOSE REDUCTION: This  exam was performed according to the departmental dose-optimization program which includes automated exposure control, adjustment of the mA and/or kV according to patient size and/or use of iterative reconstruction technique. CONTRAST:  145mL OMNIPAQUE IOHEXOL 350 MG/ML SOLN COMPARISON:  Chest radiograph dated September 21, 2021 FINDINGS: Cardiovascular: Satisfactory opacification of the pulmonary arteries to the segmental level. No evidence of pulmonary embolism. Normal heart size. No pericardial effusion. Enlarged heart. Pericardial effusion measuring 9 mm in greatest thickness. Calcific atherosclerotic disease of the aorta. Mediastinum/Nodes: No enlarged mediastinal, hilar, or axillary lymph nodes. Thyroid gland, trachea, and esophagus demonstrate no significant findings. Lungs/Pleura: Interstitial pulmonary  edema. Multiple few mm right upper lobe ground-glass subpleural nodules. 3 mm subpleural pulmonary nodule, image 49/134. Areas of probable rounded atelectasis in the right middle lobe, lingula and the dependent portions of the bilateral lower lobes. 8 mm soft tissue mass with coarse central calcification in the right lower lobe, image 58/134. bilateral small pleural effusions. Upper Abdomen: No acute abnormality. Musculoskeletal: No chest wall abnormality. No acute or significant osseous findings. Review of the MIP images confirms the above findings. IMPRESSION: 1. No evidence of pulmonary embolism. 2. Cardiomegaly with moderate in size pericardial effusion. 3. Interstitial pulmonary edema with small bilateral pleural effusions. 4. Areas of probable rounded atelectasis in the right middle lobe, lingula and the dependent portions of the bilateral lower lobes. 5. 8 mm soft tissue mass with coarse central calcification in the right lower lobe. 6. Multiple few mm right upper lobe ground-glass subpleural nodules, and 3 mm soft tissue subpleural pulmonary nodule in the left lower lobe. Non-contrast chest CT at 3-6 months is recommended. If the nodules are stable at time of repeat CT, then future CT at 18-24 months (from today's scan) is considered optional for low-risk patients, but is recommended for high-risk patients. This recommendation follows the consensus statement: Guidelines for Management of Incidental Pulmonary Nodules Detected on CT Images: From the Fleischner Society 2017; Radiology 2017; 284:228-243. Aortic Atherosclerosis (ICD10-I70.0). Electronically Signed   By: Fidela Salisbury M.D.   On: 09/21/2021 13:47  ? ?DG Chest Port 1 View ? ?Result Date: 09/21/2021 ?CLINICAL DATA:  Acute shortness of breath and vomiting. EXAM: PORTABLE CHEST 1 VIEW COMPARISON:  06/27/2020 prior radiographs FINDINGS: Cardiomegaly and pulmonary vascular congestion noted. Equivocal mild interstitial opacities are noted. There is  no evidence of focal airspace disease, suspicious pulmonary nodule/mass, pleural effusion, or pneumothorax. No acute bony abnormalities are identified. IMPRESSION: Cardiomegaly with pulmonary vascular congestion and equivocal mild interstitial opacities/edema. Electronically Signed   By: Margarette Canada M.D.   On: 09/21/2021 11:38   ? ?Medications: ?Infusions: ? ? ?Scheduled Medications: ? amLODipine  10 mg Oral Daily  ? atorvastatin  20 mg Oral Daily  ? Chlorhexidine Gluconate Cloth  6 each Topical Q0600  ? docusate sodium  100-200 mg Oral BID  ? insulin aspart  0-6 Units Subcutaneous TID WC  ? labetalol  200 mg Oral BID  ? linagliptin  5 mg Oral Daily  ? metoprolol tartrate  5 mg Intravenous Q6H  ? pantoprazole  40 mg Oral BID AC  ? ? have reviewed scheduled and prn medications. ? ?Physical Exam: ?General:NAD, comfortable ?Heart:RRR, s1s2 nl ?Lungs: Clear, no increased work of breathing. ?Abdomen:soft, Non-tender, non-distended ?Extremities:No edema ?Dialysis Access: Right upper extremity AV fistula has good thrill and bruit. ? ?[place] ?09/22/2021,8:52 AM ? LOS: 0 days  ? ?

## 2021-09-22 NOTE — Transfer of Care (Signed)
Immediate Anesthesia Transfer of Care Note ? ?Patient: Rachael Burke ? ?Procedure(s) Performed: ESOPHAGOGASTRODUODENOSCOPY (EGD) WITH PROPOFOL ?BIOPSY ? ?Patient Location: Endoscopy Unit ? ?Anesthesia Type:MAC ? ?Level of Consciousness: awake, drowsy and patient cooperative ? ?Airway & Oxygen Therapy: Patient Spontanous Breathing ? ?Post-op Assessment: Report given to RN and Post -op Vital signs reviewed and stable ? ?Post vital signs: Reviewed and stable ? ?Last Vitals:  ?Vitals Value Taken Time  ?BP 157/46 09/22/21 1450  ?Temp    ?Pulse 63 09/22/21 1451  ?Resp 13 09/22/21 1451  ?SpO2 98 % 09/22/21 1451  ?Vitals shown include unvalidated device data. ? ?Last Pain:  ?Vitals:  ? 09/22/21 1312  ?TempSrc: Temporal  ?PainSc: 5   ?   ? ?  ? ?Complications: No notable events documented. ?

## 2021-09-22 NOTE — Care Management Obs Status (Signed)
MEDICARE OBSERVATION STATUS NOTIFICATION ? ? ?Patient Details  ?Name: Rachael Burke ?MRN: [id number] ?Date of Birth: 03-05-1956 ? ? ?Medicare Observation Status Notification Given:  Yes ? ? ? ?Tom-Johnson, Renea Ee, RN ?09/22/2021, 2:14 PM ?

## 2021-09-22 NOTE — Progress Notes (Addendum)
?PROGRESS NOTE ? ? ? ?Rachael Burke  BJY:[id number] DOB: [birth year] DOA: 09/21/2021 ?PCP: Trey Sailors, PA  ? ? ?Chief Complaint  ?Patient presents with  ? Emesis  ? Shortness of Breath  ? ? ?Brief Narrative:  ? ? ?Rachael Burke is a 66 y.o. female with medical history significant of ESRD on HD, HTN, IIDM, chronic diastolic CHF, presented with new onset of spitting out fresh red blood. ?  ?Her symptoms started about 2 weeks ago, usual symptoms including severe nauseous, new onset of epigastric pain radiating to the back, sharp-like, sharp-like and frequent retching but no vomiting.  She reported the pain has been, goes but no particular relation to her meals.  She does feel frequent epigastric pain at night as well.  She came to ED on April 7, CT scan abdomen pelvis negative for acute findings patient discharged home.  Patient then went to see PCP, who suspect she developed an ulcer and started on sucralfate.  She takes sucralfate 1-2 times a day for the last 1 week with no significant improvement.  Yesterday morning, she woke up with " something in her throat" and spit up fresh red blood.  First time, size about one quarter of a cup, bright bright with clotting.  Daughter took 1 picture (attached below).  The patient denies any cough or shortness of breath nauseous vomiting.  Then she subsequently spat 3 times with bright red blood with clots.  Including most recent episode this morning about 5 AM.  She does become more nauseous this morning and did not take her morning BP pills.  Her last dialysis was on Friday. ?-Her hemoglobin has been stable, around baseline, her Hemoccult was negative, she was admitted for further work-up. ?CT angiogram negative for PE, incidental finding of 8 mm right lung nodule, and some lung vascular congestion.  Nephrology consulted.  BUN 29, creatinine 7.1. ?  ? ?Assessment & Plan: ?  ?Principal Problem: ?  Hemoptysis ?Active Problems: ?  End stage renal disease  on dialysis [facility]) ?  Hematemesis with nausea ?  Nausea and vomiting ? ? ?Hemoptysis versus upper GI bleed ?-CT showed lung congestion, compatible with CHF/fluid overload, but she does not have a cough and blood clot and bright red blood coughing/spitting appears more lung/Bronchial source.  There is also incidental finding of a lung 8 mm nodule. ?-GI input greatly appreciated, she went for endoscopy, with no evidence of active upper GI bleed. ?-Have discussed with pulmonary, will monitor over next 24 hours to evaluate for any recurrence of mopped assist that she received HD today as some evidence of pleural effusion and pulmonary edema which may be contributing to her hemoptysis, now she is dialyzed this will help.  ?-She continues to have symptoms, then likely she would require bronchoscopy.  ?-Continue to monitor CBC closely. ?-Hold DVT prophylaxis for now ?-Acute pulmonary edema would be causing symptoms resembling hemoptysis, now she is dialyzed, and blood pressure is better controlled hopefully symptoms will improve. ?  ?Acute on chronic diastolic CHF decompensation/acute pulmonary edema ?-At bedtime significant for interstitial pulmonary edema, with bilateral pleural effusion, most likely combination due to volume overload from ESRD, decompensated heart failure, and hypertensive urgency uncontrolled blood pressure ?-Secondary to uncontrolled hypertension, likely from noncoherent with BP meds ?-Resume home BP meds plus as needed hydralazine ?-Received HD today. ? ?Hypertension ?-  continue with home medication including labetalol, Norvasc , did add as needed hydralazine. ? ?IIDM ?-Continue with insulin sliding scale  for now ?-Continue with home medication including Januvia and Tradjenta ? ?Hyperlipidemia ?-Continue with statin ?  ?  ?ESRD ?-HD MWF ?  ? ? ?DVT prophylaxis: SCD ?Code Status: Full ?Family Communication: None at bedside ?Disposition:  ? ?Status is: Observation ?The patient will require care  spanning > 2 midnights and should be moved to inpatient because: Still needs further work-up and evaluation of her hemoptysis,  ?Consultants:  ?PCCM ?Renal ? ? ?Subjective: ? ?Patient was seen earlier today during dialysis, she denies any chest pain, or shortness of breath ? ?Objective: ?Vitals:  ? 09/22/21 1312 09/22/21 1453 09/22/21 1503 09/22/21 1513  ?BP: (!) 193/58 (!) 157/46 (!) 168/53 (!) 174/54  ?Pulse: 71 64 66 67  ?Resp: 15 13 12 11   ?Temp: (!) 97.5 ?F (36.4 ?C)     ?TempSrc: Temporal     ?SpO2: 93% 98% 95% 99%  ?Weight: 66.5 kg     ?Height:      ? ? ?Intake/Output Summary (Last 24 hours) at 09/22/2021 1522 ?Last data filed at 09/22/2021 1445 ?Gross per 24 hour  ?Intake 400 ml  ?Output 3000 ml  ?Net -2600 ml  ? ?Filed Weights  ? 09/22/21 0750 09/22/21 1130 09/22/21 1312  ?Weight: 69.5 kg 66.5 kg 66.5 kg  ? ? ?Examination: ? ?Awake Alert, Oriented X 3, No new F.N deficits, Normal affect ?Symmetrical Chest wall movement, diminished air entry at the bases ?RRR,No Gallops,Rubs or new Murmurs, No Parasternal Heave ?+ve B.Sounds, Abd Soft, No tenderness, No rebound - guarding or rigidity. ?No Cyanosis, Clubbing or edema, No new Rash or bruise   ? ? ? ?Data Reviewed: I have personally reviewed following labs and imaging studies ? ?CBC: ?Recent Labs  ?Lab 09/21/21 ?1007 09/22/21 ?0330  ?WBC 8.9 9.4  ?NEUTROABS 6.8  --   ?HGB 8.6*  9.0* 8.4*  ?HCT 28.2*  29.5* 26.9*  ?MCV 91.0 90.0  ?PLT 140* 113*  ? ? ?Basic Metabolic Panel: ?Recent Labs  ?Lab 09/21/21 ?1007 09/21/21 ?2034 09/22/21 ?0330  ?NA 138  --  137  ?K 4.3  --  4.8  ?CL 98  --  99  ?CO2 27  --  28  ?GLUCOSE 95  --  88  ?BUN 29*  --  34*  ?CREATININE 7.14*  --  8.44*  ?CALCIUM 9.2  --  8.6*  ?PHOS  --  4.2  --   ? ? ?GFR: ?Estimated Creatinine Clearance: 6.1 mL/min (A) (by C-G formula based on SCr of 8.44 mg/dL (H)). ? ?Liver Function Tests: ?Recent Labs  ?Lab 09/21/21 ?1007  ?AST 16  ?ALT 11  ?ALKPHOS 79  ?BILITOT 0.9  ?PROT 7.5  ?ALBUMIN 3.8   ? ? ?CBG: ?Recent Labs  ?Lab 09/21/21 ?1724 09/21/21 ?2253  ?GLUCAP 107* 91  ? ? ? ?Recent Results (from the past 240 hour(s))  ?MRSA Next Gen by PCR, Nasal     Status: None  ? Collection Time: 09/21/21  8:56 PM  ? Specimen: Nasal Mucosa; Nasal Swab  ?Result Value Ref Range Status  ? MRSA by PCR Next Gen NOT DETECTED NOT DETECTED Final  ?  Comment: (NOTE) ?The GeneXpert MRSA Assay (FDA approved for NASAL specimens only), ?is one component of a comprehensive MRSA colonization surveillance ?program. It is not intended to diagnose MRSA infection nor to guide ?or monitor treatment for MRSA infections. ?Test performance is not FDA approved in patients less than 2 years ?old. ?Performed at [facility], [place] [address], [place], [place] ?[address] ?  ?  ? ? ? ? ? ?  Radiology Studies: ?CT Angio Chest PE W and/or Wo Contrast ? ?Result Date: 09/21/2021 ?CLINICAL DATA:  High probability of pulmonary embolism. EXAM: CT ANGIOGRAPHY CHEST WITH CONTRAST TECHNIQUE: Multidetector CT imaging of the chest was performed using the standard protocol during bolus administration of intravenous contrast. Multiplanar CT image reconstructions and MIPs were obtained to evaluate the vascular anatomy. RADIATION DOSE REDUCTION: This exam was performed according to the departmental dose-optimization program which includes automated exposure control, adjustment of the mA and/or kV according to patient size and/or use of iterative reconstruction technique. CONTRAST:  116mL OMNIPAQUE IOHEXOL 350 MG/ML SOLN COMPARISON:  Chest radiograph dated September 21, 2021 FINDINGS: Cardiovascular: Satisfactory opacification of the pulmonary arteries to the segmental level. No evidence of pulmonary embolism. Normal heart size. No pericardial effusion. Enlarged heart. Pericardial effusion measuring 9 mm in greatest thickness. Calcific atherosclerotic disease of the aorta. Mediastinum/Nodes: No enlarged mediastinal, hilar, or axillary lymph nodes. Thyroid gland,  trachea, and esophagus demonstrate no significant findings. Lungs/Pleura: Interstitial pulmonary edema. Multiple few mm right upper lobe ground-glass subpleural nodules. 3 mm subpleural pulmonary nodule, image 49/134. Areas

## 2021-09-22 NOTE — H&P (View-Only) (Signed)
? ?                                                                          [facility] Consult: ?9:11 AM ?09/22/2021 ? LOS: 0 days  ? ? ?Referring Provider: Dr Waldron Labs.    ?Primary Care Physician:  Trey Sailors, PA ?Primary Gastroenterologist:  unassigned  ? ? ? ?Reason for Consultation: Hematemesis, hemoptysis, anemia. ?  ?HPI: Rachael Burke is a 66 y.o. female.  Spanish speaking patient originally from [place].  Lives with her daughter in [place].  Anemia with Hb as low as 6.3 on 07/18/2021 and was transfused with 1 PRBC at [facility] ER that same day.  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 ESRD on hemodialysis MWF.  Heart murmur.  No advanced valvular disease by echo of 07/2020.  Colonoscopy around 2020 in [place] was unremarkable per patient and daughters reports/recall. ? ?Beginning 3 weeks ago she had occurrence of vomiting, difficulty urinating, constipation and weakness that lasted for about 3 days.  Event precipitated by eating shrimp.  Symptoms persisted where she had a globus sensation as well as epigastric sensation that was perhaps at most mildly painful.  She was seen and had CT scan abdomen pelvis April 7 which was negative for acute findings, however it did show hepatomegaly without obvious cirrhosis or liver lesions.  Small volume of ascites present also seen was cardiomegaly with small pericardial effusion, small pleural effusions.  Some patchy changes in the lower lungs suggested atelectasis versus pneumonia.  Discharged home.  Within a few days her PCP started sucralfate but symptoms persisted.  On 4/22 she developed acute nausea and vomiting and was spitting up fresh blood.  This happened 3 times on Saturday, and 3 times on Sunday before she got to the ER.  No reports of melena or bleeding per rectum.  Generally has a good appetite though it was reduced during the week after the episode  of eating shrimp precipitating GI symptoms.  Was prescribed esomeprazole on Saturday but no PPI or H2 blocker previously. ? ?Labs thus far reveal Hb 8.4, platelets 113, MCV 90.  Normal WBC expected derangements in BUN/creatinine.  Platelets in early April and in February normal.    ?The last time iron levels were checked in 04/2019 she had low iron and low TIBC but normal ferritin, folate and elevated B12 ? ?Family history negative for ulcer disease, colorectal cancer, gastric cancers, esophageal issues, liver issues. ?Lives with her daughter in [place]. [name] [phone].   ?  ? ?Past Medical History:  ?Diagnosis Date  ? Anemia   ? Anxiety   ? Arthritis   ? Diabetes mellitus without complication ([place])   ? Heart murmur   ? echo 07/02/20: Mild MR/TR, mild-moderate AV sclerosis, bicuspid or functional bicuspid AV, no evidence of AS. Murmr felt due to AV.  ? History of blood transfusion   ? Hyperlipidemia   ? Hypertension   ? Pneumonia   ? PONV (postoperative nausea and vomiting)   ? Renal disorder   ? ? ?Past Surgical History:  ?Procedure Laterality Date  ? AV FISTULA PLACEMENT Left 05/20/2020  ? Procedure: INSERTION OF LEFT ARM ARTERIOVENOUS (AV) FISTULA;  Surgeon: Marty Heck,  MD;  Location: [facility];  Service: Vascular;  Laterality: Left;  ? BASCILIC VEIN TRANSPOSITION Left 08/05/2020  ? Procedure: SECOND STAGE BASILIC VEIN TRANSPOSITION;  Surgeon: Marty Heck, MD;  Location: [facility];  Service: Vascular;  Laterality: Left;  ? CESAREAN SECTION    ? CHOLECYSTECTOMY    ? INSERTION OF DIALYSIS CATHETER N/A 05/20/2020  ? Procedure: INSERTION OF DIALYSIS CATHETER;  Surgeon: Marty Heck, MD;  Location: [facility];  Service: Vascular;  Laterality: N/A;  ? ? ?Prior to Admission medications   ?Medication Sig Start Date End Date Taking? Authorizing Provider  ?acetaminophen (TYLENOL) 325 MG tablet Take 2 tablets (650 mg total) by mouth every 6 (six) hours as needed for mild pain or moderate pain.  09/05/21   Carlisle Cater, PA-C  ?amLODipine (NORVASC) 10 MG tablet Take 10 mg by mouth daily.    [provider]  ?atorvastatin (LIPITOR) 20 MG tablet Take 1 tablet (20 mg total) by mouth daily. 01/01/20   Charlynne Cousins, MD  ?diclofenac Sodium (VOLTAREN) 1 % GEL Apply 4 g topically 4 (four) times daily. ?Patient taking differently: Apply 4 g topically daily as needed (Pain). 06/18/20   Just, Laurita Quint, FNP  ?docusate sodium (COLACE) 100 MG capsule Take 1-2 capsules (100-200 mg total) by mouth 2 (two) times daily. Start with twice a day once having regular BM switch to daily ?Patient taking differently: Take 100 mg by mouth daily. 05/15/20   Just, Laurita Quint, FNP  ?escitalopram (LEXAPRO) 10 MG tablet Take 1 tablet (10 mg total) by mouth daily. 07/23/20 10/21/20  Horald Pollen, MD  ?furosemide (LASIX) 80 MG tablet Take 1 tablet (80 mg total) by mouth 2 (two) times daily. 01/01/20   Charlynne Cousins, MD  ?labetalol (NORMODYNE) 200 MG tablet Take 200 mg by mouth 2 (two) times daily.    [provider]  ?linagliptin (TRADJENTA) 5 MG TABS tablet Take 5 mg by mouth daily.    [provider]  ?ondansetron (ZOFRAN-ODT) 4 MG disintegrating tablet Take 1 tablet (4 mg total) by mouth every 8 (eight) hours as needed for nausea or vomiting. 09/05/21   Carlisle Cater, PA-C  ?polyethylene glycol powder (GLYCOLAX/MIRALAX) 17 GM/SCOOP powder Take 17 g by mouth 2 (two) times daily as needed. 05/15/20   Just, Laurita Quint, FNP  ?sitaGLIPtin (JANUVIA) 25 MG tablet Take 1 tablet (25 mg total) by mouth daily. 07/23/20 10/21/20  Horald Pollen, MD  ? ? ?Scheduled Meds: ? amLODipine  10 mg Oral Daily  ? atorvastatin  20 mg Oral Daily  ? Chlorhexidine Gluconate Cloth  6 each Topical Q0600  ? docusate sodium  100-200 mg Oral BID  ? doxercalciferol  9 mcg Intravenous Q M,W,F-HD  ? insulin aspart  0-6 Units Subcutaneous TID WC  ? labetalol  200 mg Oral BID  ? linagliptin  5 mg Oral Daily  ? metoprolol tartrate   5 mg Intravenous Q6H  ? pantoprazole  40 mg Oral BID AC  ? ?Infusions: ? ?PRN Meds: ?acetaminophen, hydrALAZINE, ondansetron, polyethylene glycol powder ? ? ?Allergies as of 09/21/2021  ? (No Known Allergies)  ? ? ?Family History  ?Problem Relation Age of Onset  ? Diabetes Mother   ? Hypertension Father   ? Diabetes Sister   ? Diabetes Brother   ? Diabetes Brother   ? ? ?Social History  ? ?Socioeconomic History  ? Marital status: Married  ?  Spouse name: Not on file  ? Number of children:  Not on file  ? Years of education: Not on file  ? Highest education level: Not on file  ?Occupational History  ? Not on file  ?Tobacco Use  ? Smoking status: Never  ? Smokeless tobacco: Never  ?Vaping Use  ? Vaping Use: Never used  ?Substance and Sexual Activity  ? Alcohol use: Never  ? Drug use: Never  ? Sexual activity: Not on file  ?Other Topics Concern  ? Not on file  ?Social History Narrative  ? Not on file  ? ?Social Determinants of Health  ? ?Financial Resource Strain: Not on file  ?Food Insecurity: Not on file  ?Transportation Needs: Not on file  ?Physical Activity: Not on file  ?Stress: Not on file  ?Social Connections: Not on file  ?Intimate Partner Violence: Not on file  ? ? ?REVIEW OF SYSTEMS: ?Constitutional: Some fatigue but nothing profound and no profound weakness. ?ENT:  No nose bleeds ?Pulm: No difficulty breathing or cough ?CV:  No palpitations, no LE edema.  Reported angina. ?GU:  No hematuria, no frequency ?GI: See HPI. ?Heme: Other than the hematemesis, no reports of unusual or excessive bleeding or bruising. ?Transfusions: In February per HPI. ?Neuro:  No headaches, no peripheral tingling or numbness.  No dizziness, no syncope. ?Derm:  No itching, no rash or sores.  ?Endocrine:  No sweats or chills.  No polyuria or dysuria ?Immunization: Vaccination history reviewed. ?Travel:  None beyond local counties in last few months.  ? ? ?PHYSICAL EXAM: ?Vital signs in last 24 hours: ?Vitals:  ? 09/22/21 0754 09/22/21  0830  ?BP: (!) 169/74 (!) 169/74  ?Pulse: 74 68  ?Resp: 13 19  ?Temp:    ?SpO2:    ? ?Wt Readings from Last 3 Encounters:  ?09/22/21 69.5 kg  ?07/18/21 69.9 kg  ?08/27/20 69.9 kg  ? ? ?Dialysis nurse as

## 2021-09-22 NOTE — Progress Notes (Addendum)
?  Transition of Care (TOC) Screening Note ? ? ?Patient Details  ?Name: Rachael Burke ?Date of Birth: 03/02/1956 ? ? ?Transition of Care (TOC) CM/SW Contact:    ?Tom-Johnson, Renea Ee, RN ?Phone Number: ?09/22/2021, 2:15 PM ? ?Patient is admitted for Hemoptysis. Scheduled for EGD today. ?From home with daughter. Has 10 children. Retired. Does not have any DME's at home. Daughter transports to all appointments including dialysis.  ?PCP is Trey Sailors, PA and uses [facility] on [address]	[place]. ? ?Transition of Care Department [facility]) has reviewed patient and no TOC needs or recommendations have been identified at this time. We will continue to monitor patient advancement through interdisciplinary progression rounds. If new patient transition needs arise, please place a TOC consult. ?  ?

## 2021-09-22 NOTE — Procedures (Signed)
Pt arrived to hemodialysis in stable condition, BP upon arrival was 145/76, Pt has no complaints throughout her treatment.  She continues to spit up frank red blood and large size clots. Fistula was easily cannulated , and pressure was held for 10 min for hemostasis after decannulation. Following her hemodialysis treatment her BP was 172/75 and we were able to remove 3000 net UF. ?

## 2021-09-22 NOTE — Anesthesia Procedure Notes (Signed)
Procedure Name: [place] ?Date/Time: 09/22/2021 2:32 PM ?Performed by: Dorthea Cove, CRNA ?Pre-anesthesia Checklist: Patient identified, Emergency Drugs available, Suction available, Patient being monitored and Timeout performed ?Patient Re-evaluated:Patient Re-evaluated prior to induction ?Oxygen Delivery Method: Nasal cannula ?Preoxygenation: Pre-oxygenation with 100% oxygen ?Induction Type: IV induction ?Placement Confirmation: positive ETCO2 and CO2 detector ?Dental Injury: Teeth and Oropharynx as per pre-operative assessment  ? ? ? ? ?

## 2021-09-22 NOTE — Anesthesia Postprocedure Evaluation (Signed)
Anesthesia Post Note ? ?Patient: Rachael Burke ? ?Procedure(s) Performed: ESOPHAGOGASTRODUODENOSCOPY (EGD) WITH PROPOFOL ?BIOPSY ? ?  ? ?Patient location during evaluation: PACU ?Anesthesia Type: MAC ?Level of consciousness: awake ?Pain management: pain level controlled ?Vital Signs Assessment: post-procedure vital signs reviewed and stable ?Respiratory status: spontaneous breathing and respiratory function stable ?Cardiovascular status: stable ?Postop Assessment: no apparent nausea or vomiting ?Anesthetic complications: no ? ? ?No notable events documented. ? ?Last Vitals:  ?Vitals:  ? 09/22/21 1503 09/22/21 1513  ?BP: (!) 168/53 (!) 174/54  ?Pulse: 66 67  ?Resp: 12 11  ?Temp:    ?SpO2: 95% 99%  ?  ?Last Pain:  ?Vitals:  ? 09/22/21 1513  ?TempSrc:   ?PainSc: 0-No pain  ? ? ?  ?  ?  ?  ?  ?  ? ?Merlinda Frederick ? ? ? ? ?

## 2021-09-22 NOTE — Op Note (Signed)
[facility] ?Patient Name: Rachael Burke ?Procedure Date : 09/22/2021 ?MRN: [id number] ?Attending MD: Ladene Artist , MD ?Date of Birth: 1955/08/31 ?CSN: [id number] ?Age: 66 ?Admit Type: Inpatient ?Procedure:                Upper GI endoscopy ?Indications:              Hematemesis vs hemoptysis, Nausea with vomiting ?Providers:                Pricilla Riffle. Fuller Plan, MD, Jaci Carrel, RN, Elberta Fortis  ?                          Johnella Moloney, Technician ?Referring MD:             [facility] ?Medicines:                Monitored Anesthesia Care ?Complications:            No immediate complications. ?Estimated Blood Loss:     Estimated blood loss was minimal. ?Procedure:                Pre-Anesthesia Assessment: ?                          - Prior to the procedure, a History and Physical  ?                          was performed, and patient medications and  ?                          allergies were reviewed. The patient's tolerance of  ?                          previous anesthesia was also reviewed. The risks  ?                          and benefits of the procedure and the sedation  ?                          options and risks were discussed with the patient.  ?                          All questions were answered, and informed consent  ?                          was obtained. Prior Anticoagulants: The patient has  ?                          taken no previous anticoagulant or antiplatelet  ?                          agents. ASA Grade Assessment: III - A patient with  ?                          severe systemic disease. After reviewing the risks  ?  and benefits, the patient was deemed in  ?                          satisfactory condition to undergo the procedure. ?                          After obtaining informed consent, the endoscope was  ?                          passed under direct vision. Throughout the  ?                          procedure, the patient's blood pressure, pulse, and   ?                          oxygen saturations were monitored continuously. The  ?                          [id number]-H190 ([id number]) Olympus endoscope was introduced  ?                          through the mouth, and advanced to the second part  ?                          of duodenum. The upper GI endoscopy was  ?                          accomplished without difficulty. The patient  ?                          tolerated the procedure well. ?Scope In: ?Scope Out: ?Findings: ?     The examined esophagus was normal. ?     Localized mildly erythematous mucosa without bleeding was found in the  ?     gastric fundus c/w vomiting induced changes. Biopsies were taken with a  ?     cold forceps for histology. ?     Striped mildly erythematous mucosa without bleeding was found in the  ?     gastric body. Biopsies were taken with a cold forceps for histology. ?     The exam of the stomach was otherwise normal. ?     The duodenal bulb and second portion of the duodenum were normal. ?     - No blood in the UGI tract ?Impression:               - Normal esophagus. ?                          - Erythematous mucosa in the gastric fundus.  ?                          Biopsied. ?                          - Erythematous mucosa in the gastric body. Biopsied. ?                          -  Normal duodenal bulb and second portion of the  ?                          duodenum. ?Recommendation:           - Return patient to hospital ward for ongoing care. ?                          - Full liquid diet today. ?                          - Continue present medications. ?                          - Await pathology results. ?                          - No clear source for hematemesis noted. Fundic  ?                          gastropathy is a potential source. ?                          - Consider pulmonary evaluation for possible  ?                          hemoptysis. ?Procedure Code(s):        --- Professional --- ?                          [phone],  Esophagogastroduodenoscopy, flexible,  ?                          transoral; with biopsy, single or multiple ?Diagnosis Code(s):        --- Professional --- ?                          K31.89, Other diseases of stomach and duodenum ?                          K92.0, Hematemesis ?                          R11.2, Nausea with vomiting, unspecified ?CPT copyright 2019 American Medical Association. All rights reserved. ?The codes documented in this report are preliminary and upon coder review may  ?be revised to meet current compliance requirements. ?Ladene Artist, MD ?09/22/2021 2:53:00 PM ?This report has been signed electronically. ?Number of Addenda: 0 ?

## 2021-09-22 NOTE — Anesthesia Preprocedure Evaluation (Addendum)
Anesthesia Evaluation  ? ?Patient awake ? ? ? ?Reviewed: ?Allergy & Precautions, NPO status , Patient's Chart, lab work & pertinent test results ? ?History of Anesthesia Complications ?(+) PONV and history of anesthetic complications ? ?Airway ?Mallampati: III ? ?TM Distance: >3 FB ?Neck ROM: Full ? ? ? Dental ? ?(+) Poor Dentition, Missing ?  ?Pulmonary ?neg pulmonary ROS,  ?  ?Pulmonary exam normal ?breath sounds clear to auscultation ? ? ? ? ? ? Cardiovascular ?hypertension, Pt. on medications ?+ Valvular Problems/Murmurs (bicuspid aortic valve)  ?Rhythm:Regular Rate:Tachycardia ? ? ?  ?Neuro/Psych ?PSYCHIATRIC DISORDERS Anxiety negative neurological ROS ?   ? GI/Hepatic ?negative GI ROS, Neg liver ROS,   ?Endo/Other  ?negative endocrine ROSdiabetes, Type 2, Oral Hypoglycemic Agents ? Renal/GU ?Dialysis and ESRFRenal disease  ?negative genitourinary ?  ?Musculoskeletal ? ?(+) Arthritis , Osteoarthritis,   ? Abdominal ?  ?Peds ?negative pediatric ROS ?(+)  Hematology ? ?(+) Blood dyscrasia, anemia ,   ?Anesthesia Other Findings ? ? Reproductive/Obstetrics ?negative OB ROS ? ?  ? ? ? ? ? ? ? ? ? ? ? ? ? ?  ?  ? ? ? ? ? ? ? ?Anesthesia Physical ?Anesthesia Plan ? ?ASA: 4 ? ?Anesthesia Plan: MAC  ? ?Post-op Pain Management: Minimal or no pain anticipated  ? ?Induction: Intravenous ? ?PONV Risk Score and Plan: 3 and Treatment may vary due to age or medical condition ? ?Airway Management Planned: Natural Airway ? ?Additional Equipment: None ? ?Intra-op Plan:  ? ?Post-operative Plan:  ? ?Informed Consent: I have reviewed the patients History and Physical, chart, labs and discussed the procedure including the risks, benefits and alternatives for the proposed anesthesia with the patient or authorized representative who has indicated his/her understanding and acceptance.  ? ? ? ?Interpreter used for interveiw ? ?Plan Discussed with: CRNA, Anesthesiologist and Surgeon ? ?Anesthesia Plan  Comments:   ? ? ? ? ? ? ?Anesthesia Quick Evaluation ? ?

## 2021-09-23 DIAGNOSIS — Z992 Dependence on renal dialysis: Secondary | ICD-10-CM | POA: Diagnosis not present

## 2021-09-23 DIAGNOSIS — N186 End stage renal disease: Secondary | ICD-10-CM | POA: Diagnosis not present

## 2021-09-23 DIAGNOSIS — K920 Hematemesis: Secondary | ICD-10-CM | POA: Diagnosis not present

## 2021-09-23 DIAGNOSIS — R042 Hemoptysis: Secondary | ICD-10-CM | POA: Diagnosis not present

## 2021-09-23 DIAGNOSIS — J810 Acute pulmonary edema: Secondary | ICD-10-CM | POA: Diagnosis not present

## 2021-09-23 LAB — HIV ANTIBODY (ROUTINE TESTING W REFLEX): HIV Screen 4th Generation wRfx: NONREACTIVE

## 2021-09-23 LAB — CBC
HCT: 30 % — ABNORMAL LOW (ref 36.0–46.0)
Hemoglobin: 9.5 g/dL — ABNORMAL LOW (ref 12.0–15.0)
MCH: 27.9 pg (ref 26.0–34.0)
MCHC: 31.7 g/dL (ref 30.0–36.0)
MCV: 88.2 fL (ref 80.0–100.0)
Platelets: 131 10*3/uL — ABNORMAL LOW (ref 150–400)
RBC: 3.4 MIL/uL — ABNORMAL LOW (ref 3.87–5.11)
RDW: 16.9 % — ABNORMAL HIGH (ref 11.5–15.5)
WBC: 7.3 10*3/uL (ref 4.0–10.5)
nRBC: 0 % (ref 0.0–0.2)

## 2021-09-23 LAB — GLUCOSE, CAPILLARY
Glucose-Capillary: 104 mg/dL — ABNORMAL HIGH (ref 70–99)
Glucose-Capillary: 109 mg/dL — ABNORMAL HIGH (ref 70–99)

## 2021-09-23 LAB — HEPATITIS B SURFACE ANTIBODY, QUANTITATIVE: Hep B S AB Quant (Post): <3.1 m[IU]/mL — ABNORMAL LOW (ref >9.9)

## 2021-09-23 MED ORDER — LABETALOL HCL 200 MG PO TABS
400.0000 mg | ORAL_TABLET | Freq: Two times a day (BID) | ORAL | Status: DC
  Administered 2021-09-23: 400 mg via ORAL
  Filled 2021-09-23: qty 2

## 2021-09-23 MED ORDER — PANTOPRAZOLE SODIUM 40 MG PO TBEC: 40.0000 mg | DELAYED_RELEASE_TABLET | Freq: Every day | ORAL | 0 refills | Status: DC

## 2021-09-23 MED ORDER — HYDRALAZINE HCL 25 MG PO TABS
25.0000 mg | ORAL_TABLET | Freq: Three times a day (TID) | ORAL | Status: DC
  Administered 2021-09-23 (×2): 25 mg via ORAL
  Filled 2021-09-23 (×2): qty 1

## 2021-09-23 MED ORDER — CHLORHEXIDINE GLUCONATE CLOTH 2 % EX PADS: 6.0000 | MEDICATED_PAD | Freq: Every day | CUTANEOUS | Status: DC

## 2021-09-23 MED ORDER — HYDRALAZINE HCL 25 MG PO TABS: 25.0000 mg | ORAL_TABLET | Freq: Three times a day (TID) | ORAL | 0 refills | Status: DC

## 2021-09-23 MED ORDER — LABETALOL HCL 300 MG PO TABS: 300.0000 mg | ORAL_TABLET | Freq: Two times a day (BID) | ORAL | Status: DC

## 2021-09-23 MED ORDER — LABETALOL HCL 200 MG PO TABS: 400.0000 mg | ORAL_TABLET | Freq: Two times a day (BID) | ORAL | 0 refills | Status: DC

## 2021-09-23 MED ORDER — FUROSEMIDE 80 MG PO TABS: 80.0000 mg | ORAL_TABLET | Freq: Every day | ORAL | Status: DC | PRN

## 2021-09-23 NOTE — Discharge Instructions (Signed)
Follow with Primary MD Trey Sailors, PA in 7 days  ? ?Get CBC, CMP, checked  by Primary MD next visit.  ? ? ?Activity: As tolerated with Full fall precautions use walker/cane & assistance as needed ? ? ?Disposition Home  ? ? ?Diet: Renal Diet, Carb modified . ? ? ?On your next visit with your primary care physician please Get Medicines reviewed and adjusted. ? ? ?Please request your Prim.MD to go over all Hospital Tests and Procedure/Radiological results at the follow up, please get all Hospital records sent to your Prim MD by signing hospital release before you go home. ? ? ?If you experience worsening of your admission symptoms, develop shortness of breath, life threatening emergency, suicidal or homicidal thoughts you must seek medical attention immediately by calling 911 or calling your MD immediately  if symptoms less severe. ? ?You Must read complete instructions/literature along with all the possible adverse reactions/side effects for all the Medicines you take and that have been prescribed to you. Take any new Medicines after you have completely understood and accpet all the possible adverse reactions/side effects.  ? ?Do not drive, operating heavy machinery, perform activities at heights, swimming or participation in water activities or provide baby sitting services if your were admitted for syncope or siezures until you have seen by Primary MD or a Neurologist and advised to do so again. ? ?Do not drive when taking Pain medications.  ? ? ?Do not take more than prescribed Pain, Sleep and Anxiety Medications ? ?Special Instructions: If you have smoked or chewed Tobacco  in the last 2 yrs please stop smoking, stop any regular Alcohol  and or any Recreational drug use. ? ?Wear Seat belts while driving. ? ? ?Please note ? ?You were cared for by a hospitalist during your hospital stay. If you have any questions about your discharge medications or the care you received while you were in the hospital after  you are discharged, you can call the unit and asked to speak with the hospitalist on call if the hospitalist that took care of you is not available. Once you are discharged, your primary care physician will handle any further medical issues. Please note that NO REFILLS for any discharge medications will be authorized once you are discharged, as it is imperative that you return to your primary care physician (or establish a relationship with a primary care physician if you do not have one) for your aftercare needs so that they can reassess your need for medications and monitor your lab values.  ?

## 2021-09-23 NOTE — Progress Notes (Signed)
Patients daughter had an appointment at 2:15pm and said that she would come and pick patient up after her appointment because patient does not speak [place].  Daughter said that she would like for [place] to go over the discharge information when she comes back to pick patient up.   ? ?Aurther Loft, RN ?

## 2021-09-23 NOTE — Progress Notes (Signed)
Pt receives out-pt HD at [facility] on MWF. Alerted by attending that pt will d/c today. Contacted [facility] and spoke to [place]. Clinic advised of pt's d/c today and that pt will resume care tomorrow.  ? ?Melven Sartorius ?Renal Navigator ?[phone] ?

## 2021-09-23 NOTE — Discharge Summary (Addendum)
Physician Discharge Summary  ?Rachael Burke SJG:[id number] DOB: 01-23-1956 DOA: 09/21/2021 ? ?PCP: Trey Sailors, PA ? ?Admit date: 09/21/2021 ?Discharge date: 09/23/2021 ? ?Admitted From: Home ?Disposition:  Home ? ?Recommendations for Outpatient Follow-up:  ?Follow up with PCP in 1-2 weeks ?Patient will need repeat CT chest in 3 months, and referral to pulmonary if CT chest abnormalities persist. ? ?Home Health:NO ? ?Discharge Condition:Stable ?CODE STATUS:FULL ?Diet recommendation: Carb Modified / Renal ? ?Brief/Interim Summary: ? ?  ?Rachael Burke is a 66 y.o. female with medical history significant of ESRD on HD, HTN, IIDM, chronic diastolic CHF, presented with new onset of spitting out fresh red blood. ?  ?Her symptoms started about 2 weeks ago, usual symptoms including severe nauseous, new onset of epigastric pain radiating to the back, sharp-like, sharp-like and frequent retching but no vomiting.  She reported the pain has been, goes but no particular relation to her meals.  She does feel frequent epigastric pain at night as well.  She came to ED on April 7, CT scan abdomen pelvis negative for acute findings patient discharged home.  Patient then went to see PCP, who suspect she developed an ulcer and started on sucralfate.  She takes sucralfate 1-2 times a day for the last 1 week with no significant improvement.  Yesterday morning, she woke up with " something in her throat" and spit up fresh red blood.  First time, size about one quarter of a cup, bright bright with clotting.  Daughter took 1 picture (in media).  The patient denies any cough or shortness of breath nauseous vomiting.  Then she subsequently spat 3 times with bright red blood with clots.  Including most recent episode this morning about 5 AM.  She does become more nauseous this morning and did not take her morning BP pills.  Her last dialysis was on Friday.  Upon presentation to ED Her hemoglobin has been stable, around  baseline, her Hemoccult was negative, she was admitted for further work-up. ?CT angiogram negative for PE, incidental finding of 8 mm right lung nodule, and some lung vascular congestion.  Nephrology consulted.  BUN 29, creatinine 7.1. ? ?  ?Hemoptysis  ?-This is secondary to pulmonary edema/volume overload, initial suspicion for pulmonary versus GI source ?-GI input greatly appreciated, she went for endoscopy, significant only for mild erythematous mucosa in gastric fundus and body, which was biopsied, but otherwise no evidence of active upper GI bleed . ?-Pulmonary service were consulted, they felt finding related to pulmonary edema and volume overload causing her hemoptysis, as after blood pressure control and dialysis no recurrence of her symptoms. ?-Recommendation is to repeat CT chest in 53-month, and to follow with pulmonary as an outpatient if changes persist ?  ?Acute on chronic diastolic CHF decompensation/acute pulmonary edema ?-At bedtime significant for interstitial pulmonary edema, with bilateral pleural effusion, most likely combination due to volume overload from ESRD, decompensated heart failure, and hypertensive urgency uncontrolled blood pressure ?-Volume management with dialysis, optimize blood pressure control. ?  ?Hypertension ?-Blood pressure is elevated, she is discharged on home dose Norvasc, labetalol has been increased from 300 to 400 mg oral twice daily, she was started on hydralazine 25 mg 3 times daily ?  ?IIDM ?-Continue with home regimen ?  ?Hyperlipidemia ?-Continue with statin ?  ?  ?ESRD ?-HD MWF ?  ?   ?Pericardial effusion. ?- Small effusion present on 4/24 echo.  ?-This is likely due to ESRD ? ?  ? ?Discharge  Diagnoses:  ?Principal Problem: ?  Hemoptysis ?Active Problems: ?  End stage renal disease on dialysis [facility]) ?  Hematemesis with nausea ?  Nausea and vomiting ? ? ? ?Discharge Instructions ? ?Discharge Instructions   ? ? Diet - low sodium heart healthy   Complete by: As  directed ?  ? Discharge instructions   Complete by: As directed ?  ? Follow with Primary MD Trey Sailors, PA in 7 days  ? ?Get CBC, CMP, checked  by Primary MD next visit.  ? ? ?Activity: As tolerated with Full fall precautions use walker/cane & assistance as needed ? ? ?Disposition Home  ? ? ?Diet: Renal Diet, Carb modified . ? ? ?On your next visit with your primary care physician please Get Medicines reviewed and adjusted. ? ? ?Please request your Prim.MD to go over all Hospital Tests and Procedure/Radiological results at the follow up, please get all Hospital records sent to your Prim MD by signing hospital release before you go home. ? ? ?If you experience worsening of your admission symptoms, develop shortness of breath, life threatening emergency, suicidal or homicidal thoughts you must seek medical attention immediately by calling 911 or calling your MD immediately  if symptoms less severe. ? ?You Must read complete instructions/literature along with all the possible adverse reactions/side effects for all the Medicines you take and that have been prescribed to you. Take any new Medicines after you have completely understood and accpet all the possible adverse reactions/side effects.  ? ?Do not drive, operating heavy machinery, perform activities at heights, swimming or participation in water activities or provide baby sitting services if your were admitted for syncope or siezures until you have seen by Primary MD or a Neurologist and advised to do so again. ? ?Do not drive when taking Pain medications.  ? ? ?Do not take more than prescribed Pain, Sleep and Anxiety Medications ? ?Special Instructions: If you have smoked or chewed Tobacco  in the last 2 yrs please stop smoking, stop any regular Alcohol  and or any Recreational drug use. ? ?Wear Seat belts while driving. ? ? ?Please note ? ?You were cared for by a hospitalist during your hospital stay. If you have any questions about your discharge  medications or the care you received while you were in the hospital after you are discharged, you can call the unit and asked to speak with the hospitalist on call if the hospitalist that took care of you is not available. Once you are discharged, your primary care physician will handle any further medical issues. Please note that NO REFILLS for any discharge medications will be authorized once you are discharged, as it is imperative that you return to your primary care physician (or establish a relationship with a primary care physician if you do not have one) for your aftercare needs so that they can reassess your need for medications and monitor your lab values.  ? Increase activity slowly   Complete by: As directed ?  ? ?  ? ?Allergies as of 09/23/2021   ?No Known Allergies ?  ? ?  ?Medication List  ?  ? ?STOP taking these medications   ? ?docusate sodium 100 MG capsule ?Commonly known as: Colace ?  ?polyethylene glycol powder 17 GM/SCOOP powder ?Commonly known as: GLYCOLAX/MIRALAX ?  ? ?  ? ?TAKE these medications   ? ?acetaminophen 325 MG tablet ?Commonly known as: TYLENOL ?Take 2 tablets (650 mg total) by mouth every 6 (six) hours  as needed for mild pain or moderate pain. ?  ?amLODipine 10 MG tablet ?Commonly known as: NORVASC ?Take 10 mg by mouth daily. ?  ?atorvastatin 20 MG tablet ?Commonly known as: LIPITOR ?Take 1 tablet (20 mg total) by mouth daily. ?  ?calcium acetate 667 MG capsule ?Commonly known as: PHOSLO ?Take 667 mg by mouth 3 (three) times daily. ?  ?diclofenac Sodium 1 % Gel ?Commonly known as: Voltaren ?Apply 4 g topically 4 (four) times daily. ?What changed:  ?when to take this ?reasons to take this ?  ?escitalopram 10 MG tablet ?Commonly known as: Lexapro ?Take 1 tablet (10 mg total) by mouth daily. ?  ?furosemide 80 MG tablet ?Commonly known as: LASIX ?Take 1 tablet (80 mg total) by mouth daily as needed for fluid. ?  ?gabapentin 100 MG capsule ?Commonly known as: NEURONTIN ?Take 100 mg by  mouth at bedtime. ?  ?hydrALAZINE 25 MG tablet ?Commonly known as: APRESOLINE ?Take 1 tablet (25 mg total) by mouth 3 (three) times daily. ?  ?labetalol 200 MG tablet ?Commonly known as: NORMODYNE ?Take 2 tablet

## 2021-09-23 NOTE — Progress Notes (Signed)
DISCHARGE NOTE HOME ?Rachael Burke to be discharged Home per MD order. Discussed prescriptions and follow up appointments with the patient. Prescriptions given to patient; medication list explained in detail. Patient verbalized understanding. ? ?Skin clean, dry and intact without evidence of skin break down, no evidence of skin tears noted. IV catheter discontinued intact. Site without signs and symptoms of complications. Dressing and pressure applied. Pt denies pain at the site currently. No complaints noted. ? ?Patient free of lines, drains, and wounds.  ? ?An After Visit Summary (AVS) was printed and given to the patient. ?Patient escorted via wheelchair, and discharged home via private auto. ? ?Vira Agar, RN  ?

## 2021-09-23 NOTE — Progress Notes (Signed)
[facility] ?NEPHROLOGY PROGRESS NOTE ? ?Assessment/ Plan: ?Pt is a 66 y.o. yo female  w/ hx of DM2, HTN, HL, PNA, ESRD on HD presents to ED w/ several episodes of coughing up blood. ? ?OP HD orders:  ?[facility], MWF ?3.5 hrs, 2K, 2ca, 400/500, edw 69 kg, LUE AVF ?Mircera 150 mcg 4/19 ?Hectorol 9 mcg 3x/week ? ?# Hematemesis vs Hemopytsis: She underwent a EGD on 4/24 with gastric erythema.  Seen by both GI and pulmonologist.  May consider diagnostic bronchoscopy.  It seems like the bleeding is subsiding today. ? ?# ESRD MWF: Status post HD yesterday with around 3 L UF, tolerated well, AV fistula for the access.  Next HD tomorrow, avoid heparin. ? ?# Anemia, complicated by blood loss via hemoptysis versus hematemesis: Received Mircera on 4/19.  Monitor hemoglobin and transfuse as needed. ? ?# Secondary hyperparathyroidism: Continue Hectorol, monitor calcium and phosphorus level. ? ?# HTN/volume: BP elevated, resumed amlodipine and labetalol.  UF as tolerated.  Monitor BP. ? ?Subjective: Seen and examined at bedside.  Her daughter was present.  The patient reports that her hematemesis/hemoptysis is improving.  Denies nausea, vomiting, chest pain or shortness of breath.  Had dialysis yesterday and tolerated well. ? ?Objective ?Vital signs in last 24 hours: ?Vitals:  ? 09/22/21 2042 09/23/21 0026 09/23/21 0551 09/23/21 0914  ?BP: (!) 178/76 (!) 181/71 (!) 184/66 (!) 183/76  ?Pulse: 66 68 66 63  ?Resp: 18 18 17 15   ?Temp: 98.6 ?F (37 ?C) 98.4 ?F (36.9 ?C) 98.7 ?F (37.1 ?C) 97.8 ?F (36.6 ?C)  ?TempSrc: Oral Oral Oral Oral  ?SpO2: 96% 98% 98% 99%  ?Weight:      ?Height:      ? ?Weight change: 0.5 kg ? ?Intake/Output Summary (Last 24 hours) at 09/23/2021 0942 ?Last data filed at 09/22/2021 2042 ?Gross per 24 hour  ?Intake 740 ml  ?Output 3000 ml  ?Net -2260 ml  ? ? ? ? ? ? ?Labs: ?Basic Metabolic Panel: ?Recent Labs  ?Lab 09/21/21 ?1007 09/21/21 ?2034 09/22/21 ?0330  ?NA 138  --  137  ?K 4.3  --  4.8   ?CL 98  --  99  ?CO2 27  --  28  ?GLUCOSE 95  --  88  ?BUN 29*  --  34*  ?CREATININE 7.14*  --  8.44*  ?CALCIUM 9.2  --  8.6*  ?PHOS  --  4.2  --   ? ? ?Liver Function Tests: ?Recent Labs  ?Lab 09/21/21 ?1007  ?AST 16  ?ALT 11  ?ALKPHOS 79  ?BILITOT 0.9  ?PROT 7.5  ?ALBUMIN 3.8  ? ? ?Recent Labs  ?Lab 09/21/21 ?1007  ?LIPASE 40  ? ? ?No results for input(s): AMMONIA in the last 168 hours. ?CBC: ?Recent Labs  ?Lab 09/21/21 ?1007 09/22/21 ?0330 09/23/21 ?0202  ?WBC 8.9 9.4 7.3  ?NEUTROABS 6.8  --   --   ?HGB 8.6*  9.0* 8.4* 9.5*  ?HCT 28.2*  29.5* 26.9* 30.0*  ?MCV 91.0 90.0 88.2  ?PLT 140* 113* 131*  ? ? ?Cardiac Enzymes: ?No results for input(s): CKTOTAL, CKMB, CKMBINDEX, TROPONINI in the last 168 hours. ?CBG: ?Recent Labs  ?Lab 09/21/21 ?2253 09/22/21 ?1604 09/22/21 ?1824 09/22/21 ?2041 09/23/21 ?4098  ?GLUCAP 91 64* 102* 99 104*  ? ? ? ?Iron Studies: No results for input(s): IRON, TIBC, TRANSFERRIN, FERRITIN in the last 72 hours. ?Studies/Results: ?CT Angio Chest PE W and/or Wo Contrast ? ?Result Date: 09/21/2021 ?CLINICAL DATA:  High probability of  pulmonary embolism. EXAM: CT ANGIOGRAPHY CHEST WITH CONTRAST TECHNIQUE: Multidetector CT imaging of the chest was performed using the standard protocol during bolus administration of intravenous contrast. Multiplanar CT image reconstructions and MIPs were obtained to evaluate the vascular anatomy. RADIATION DOSE REDUCTION: This exam was performed according to the departmental dose-optimization program which includes automated exposure control, adjustment of the mA and/or kV according to patient size and/or use of iterative reconstruction technique. CONTRAST:  142mL OMNIPAQUE IOHEXOL 350 MG/ML SOLN COMPARISON:  Chest radiograph dated September 21, 2021 FINDINGS: Cardiovascular: Satisfactory opacification of the pulmonary arteries to the segmental level. No evidence of pulmonary embolism. Normal heart size. No pericardial effusion. Enlarged heart. Pericardial effusion  measuring 9 mm in greatest thickness. Calcific atherosclerotic disease of the aorta. Mediastinum/Nodes: No enlarged mediastinal, hilar, or axillary lymph nodes. Thyroid gland, trachea, and esophagus demonstrate no significant findings. Lungs/Pleura: Interstitial pulmonary edema. Multiple few mm right upper lobe ground-glass subpleural nodules. 3 mm subpleural pulmonary nodule, image 49/134. Areas of probable rounded atelectasis in the right middle lobe, lingula and the dependent portions of the bilateral lower lobes. 8 mm soft tissue mass with coarse central calcification in the right lower lobe, image 58/134. bilateral small pleural effusions. Upper Abdomen: No acute abnormality. Musculoskeletal: No chest wall abnormality. No acute or significant osseous findings. Review of the MIP images confirms the above findings. IMPRESSION: 1. No evidence of pulmonary embolism. 2. Cardiomegaly with moderate in size pericardial effusion. 3. Interstitial pulmonary edema with small bilateral pleural effusions. 4. Areas of probable rounded atelectasis in the right middle lobe, lingula and the dependent portions of the bilateral lower lobes. 5. 8 mm soft tissue mass with coarse central calcification in the right lower lobe. 6. Multiple few mm right upper lobe ground-glass subpleural nodules, and 3 mm soft tissue subpleural pulmonary nodule in the left lower lobe. Non-contrast chest CT at 3-6 months is recommended. If the nodules are stable at time of repeat CT, then future CT at 18-24 months (from today's scan) is considered optional for low-risk patients, but is recommended for high-risk patients. This recommendation follows the consensus statement: Guidelines for Management of Incidental Pulmonary Nodules Detected on CT Images: From the Fleischner Society 2017; Radiology 2017; 284:228-243. Aortic Atherosclerosis (ICD10-I70.0). Electronically Signed   By: Fidela Salisbury M.D.   On: 09/21/2021 13:47  ? ?DG Chest Port 1  View ? ?Result Date: 09/21/2021 ?CLINICAL DATA:  Acute shortness of breath and vomiting. EXAM: PORTABLE CHEST 1 VIEW COMPARISON:  06/27/2020 prior radiographs FINDINGS: Cardiomegaly and pulmonary vascular congestion noted. Equivocal mild interstitial opacities are noted. There is no evidence of focal airspace disease, suspicious pulmonary nodule/mass, pleural effusion, or pneumothorax. No acute bony abnormalities are identified. IMPRESSION: Cardiomegaly with pulmonary vascular congestion and equivocal mild interstitial opacities/edema. Electronically Signed   By: Margarette Canada M.D.   On: 09/21/2021 11:38  ? ?ECHOCARDIOGRAM COMPLETE ? ?Result Date: 09/22/2021 ?   ECHOCARDIOGRAM REPORT   Patient Name:   [facility] Date of Exam: 09/22/2021 Medical Rec #:  [id number]                Height:       63.0 in Accession #:    [id number]               Weight:       146.6 lb Date of Birth:  [birth year]                BSA:  1.695 m? Patient Age:    12 years                 BP:           188/65 mmHg Patient Gender: F                        HR:           68 bpm. Exam Location:  Inpatient Procedure: 2D Echo, Cardiac Doppler and Color Doppler Indications:    Pericardial effusion  History:        Patient has prior history of Echocardiogram examinations, most                 recent 07/02/2020. Risk Factors:Hypertension and Diabetes.  Sonographer:    Jefferey Pica Referring Phys: Toney Sang SOOD IMPRESSIONS  1. Left ventricular ejection fraction, by estimation, is 60 to 65%. The left ventricle has normal function. The left ventricle has no regional wall motion abnormalities. There is mild concentric left ventricular hypertrophy. Left ventricular diastolic parameters are indeterminate. Elevated left ventricular end-diastolic pressure.  2. Right ventricular systolic function is normal. The right ventricular size is normal. There is moderately elevated pulmonary artery systolic pressure. The estimated right ventricular  systolic pressure is 02.7 mmHg.  3. A small pericardial effusion is present. The pericardial effusion is localized near the right atrium, anterior to the right ventricle and circumferential.  4. The mitral valve is norma

## 2021-09-23 NOTE — TOC Transition Note (Signed)
Transition of Care (TOC) - CM/SW Discharge Note ? ? ?Patient Details  ?Name: Rachael Burke ?MRN: [id number] ?Date of Birth: 1955-10-18 ? ?Transition of Care (TOC) CM/SW Contact:  ?Tom-Johnson, Renea Ee, RN ?Phone Number: ?09/23/2021, 3:58 PM ? ? ?Clinical Narrative:    ? ? Patient is scheduled for discharge today. No TOC needs or recommendations noted. Daughter to transport at discharge. No further TOC needs noted. ?  ? ? ?Patient Goals and CMS Choice ?  ?  ?  ? ?Discharge Placement ?  ?           ?  ?  ?  ?  ? ?Discharge Plan and Services ?  ?  ?           ?  ?  ?  ?  ?  ?  ?  ?  ?  ?  ? ?Social Determinants of Health (SDOH) Interventions ?  ? ? ?Readmission Risk Interventions ?   ? View : No data to display.  ?  ?  ?  ? ? ? ? ? ?

## 2021-09-23 NOTE — H&P (View-Only) (Signed)
*This note is a progress note, not a consult note* ? ? ? ? ?NAME:  Rachael Burke, MRN:  [id number], DOB:  30-Sep-1955, LOS: 0 ?ADMISSION DATE:  09/21/2021, CONSULTATION DATE: 09/21/2021 ?REFERRING MD:  Dr. Roosevelt Locks, [facility], CHIEF COMPLAINT:  Bringing up blood  ? ?History of Present Illness:  ?66 yo female presented to ER with epigastric pain radiating to her back associated with bringing up blood, and dyspnea.  There was question regarding whether she was coughing blood or vomiting blood.  She reports that doctors in [place] told her she had a stomach ulcer a few years ago, and she was recently started on sucralfate as an outpt.  No cough, fever, or wheeze.  Noted to regurgitate blood while eating a sandwich in the ER.  She was seen in the ER also on 09/05/21 for abdominal pain, nausea and vomiting.  She was treated with zofran and tylenol at that time.  She was seen in ER on 07/28/21 for anemia with Hb 5.8 and required blood transfusion.  It is unclear what assessment was done for her anemia at that time.  Patient reports that she feels food gets stuck in her chest. ? ?Pt is Spanish speaking and assistance obtained from family in translating. ? ?Pertinent  Medical History  ?ESRD on HD M/W/F, Anemia, Anxiety, Arthritis, DM type 2, HLD, HTN, Pneumonia, Diastolic CHF ? ?Significant Hospital Events: ?Including procedures, antibiotic start and stop dates in addition to other pertinent events   ?4/23 Admit, GI consulted ? ?Studies:  ?Echo 07/15/21 >> EF 55 to 60%, prolonged LV relaxation pattern, mild LA/RA dilation, no pericardial effusion ?CT abd/pelvis 09/05/21 >> small effusions with patchy lower lobe infiltrates, cardiomegaly, small pericardial effusion, enlarged liver, atherosclerosis ?CT angio chest 09/21/21 >> cardiomegaly, pericardial effusion, atherosclerosis, interstitial edema, multiple GGO subpleural nodules up to 3 mm, rounded ATX in RML/Lingula, and lower lobes, 8 mm nodule with central calcification RLL,  small pleural effusions ?4/24 ECHO 60 to 65%. RVSF WNL.  ? ?Interim History / Subjective:  ?No acute events overnight ? ?Ambulating in room ? ?Subjective: denies chest pain, endorses some dyspnea which is the same as it was a month ago. Denies emesis, denies hematemesis. ? ?Objective   ?Blood pressure (!) 183/76, pulse 63, temperature 97.8 ?F (36.6 ?C), temperature source Oral, resp. rate 15, height 5\' 3"  (1.6 m), weight 66.5 kg, SpO2 99 %. ?   ?   ? ?Intake/Output Summary (Last 24 hours) at 09/23/2021 1004 ?Last data filed at 09/22/2021 2042 ?Gross per 24 hour  ?Intake 740 ml  ?Output 3000 ml  ?Net -2260 ml  ? ?Filed Weights  ? 09/22/21 0750 09/22/21 1130 09/22/21 1312  ?Weight: 69.5 kg 66.5 kg 66.5 kg  ? ? ?Examination: ?General: In bed, NAD, appears comfortable ?HEENT: MM pink/moist, anicteric, atraumatic ?Neuro: RASS 0, PERRL 32mm,  ?CV: S1S2, RRR, no m/r/g appreciated ?PULM:  clear in the upper lobes, clear in the lower lobes, trachea midline, chest expansion symmetric ?GI: soft, bsx4 active, non-tender   ?Extremities: warm/dry, no pretibial edema, capillary refill less than 3 seconds  ?Skin:  no rashes or lesions noted ? ?Hgb 9.5, Hct 30 ?No leukocytosis ? ? ?Resolved Hospital Problem list   ? ? ?Assessment & Plan:  ? ??Hemoptysis ?Patient reports no further hemoptysis since 10AM on 4/25 just prior to HD. EGD with no clear source of hematemesis- Dr. Fuller Plan noted fundic gastropathy as a potential source. On Room air.  ?-Discussed with Dr. Tamala Julian. No indication for  bronch at this time. Feel secondary to volume status.  ?-Please reconsult Pulm if patient has another episode of hemoptysis. ?-Discussed plan to defer bronchoscopy at this time in the absence of hemoptysis with patient and family. Patient agreeable ? ?Lung nodules. ?Seen on CT chest 4/23 ?- Will need radiographic follow with non contrast CT chest in 4-6 months (October 2023) ? ?Acute on chronic diastolic CHF, Hypertension, Hyperlipidemia. ?4/24 ECHO 60 to  65%. RVSF WNL. Patient reports dyspnea which has been slowly worsening over the past few months  ?- per primary team ?-Continue amlodipine, hydralazine, statin, ? ?ESRD on HD M/W/F. ?- primary team consulting nephrology ? ?Pericardial effusion. ?Small effusion present on 4/24 echo. Suspect secondary to ESRD ?-management per primary ? ?Anemia ??secondary to hematemesis vs hemoptysis vs CKD. No signs of active bleeding ?-Transfuse PRBC if HBG less than 7 ?-Obtain AM CBC to trend H&H ?-Monitor for signs of bleeding ? ?Diabetes mellitus type 2. ?-per primary ? ?PCCM will sign off at this time. Please reconsult if any needs arise. ? ?Best Practice (right click and "Reselect all SmartList Selections" daily)  ? ?Per primary ? ?[employer] MSN, APRN, AGACNP-BC ?[facility]  ?09/23/2021 , 10:04 AM ? ?Please see [email or web address] for pager details ? ?If no response, please call [phone] ?After hours, please call Elink at [phone] ? ? ? ? ? ?

## 2021-09-23 NOTE — Consult Note (Addendum)
*This note is a progress note, not a consult note* ? ? ? ? ?NAME:  Rachael Burke, MRN:  [id number], DOB:  August 26, 1955, LOS: 0 ?ADMISSION DATE:  09/21/2021, CONSULTATION DATE: 09/21/2021 ?REFERRING MD:  Dr. Roosevelt Locks, [facility], CHIEF COMPLAINT:  Bringing up blood  ? ?History of Present Illness:  ?66 yo female presented to ER with epigastric pain radiating to her back associated with bringing up blood, and dyspnea.  There was question regarding whether she was coughing blood or vomiting blood.  She reports that doctors in [place] told her she had a stomach ulcer a few years ago, and she was recently started on sucralfate as an outpt.  No cough, fever, or wheeze.  Noted to regurgitate blood while eating a sandwich in the ER.  She was seen in the ER also on 09/05/21 for abdominal pain, nausea and vomiting.  She was treated with zofran and tylenol at that time.  She was seen in ER on 07/28/21 for anemia with Hb 5.8 and required blood transfusion.  It is unclear what assessment was done for her anemia at that time.  Patient reports that she feels food gets stuck in her chest. ? ?Pt is Spanish speaking and assistance obtained from family in translating. ? ?Pertinent  Medical History  ?ESRD on HD M/W/F, Anemia, Anxiety, Arthritis, DM type 2, HLD, HTN, Pneumonia, Diastolic CHF ? ?Significant Hospital Events: ?Including procedures, antibiotic start and stop dates in addition to other pertinent events   ?4/23 Admit, GI consulted ? ?Studies:  ?Echo 07/15/21 >> EF 55 to 60%, prolonged LV relaxation pattern, mild LA/RA dilation, no pericardial effusion ?CT abd/pelvis 09/05/21 >> small effusions with patchy lower lobe infiltrates, cardiomegaly, small pericardial effusion, enlarged liver, atherosclerosis ?CT angio chest 09/21/21 >> cardiomegaly, pericardial effusion, atherosclerosis, interstitial edema, multiple GGO subpleural nodules up to 3 mm, rounded ATX in RML/Lingula, and lower lobes, 8 mm nodule with central calcification RLL,  small pleural effusions ?4/24 ECHO 60 to 65%. RVSF WNL.  ? ?Interim History / Subjective:  ?No acute events overnight ? ?Ambulating in room ? ?Subjective: denies chest pain, endorses some dyspnea which is the same as it was a month ago. Denies emesis, denies hematemesis. ? ?Objective   ?Blood pressure (!) 183/76, pulse 63, temperature 97.8 ?F (36.6 ?C), temperature source Oral, resp. rate 15, height 5\' 3"  (1.6 m), weight 66.5 kg, SpO2 99 %. ?   ?   ? ?Intake/Output Summary (Last 24 hours) at 09/23/2021 1004 ?Last data filed at 09/22/2021 2042 ?Gross per 24 hour  ?Intake 740 ml  ?Output 3000 ml  ?Net -2260 ml  ? ?Filed Weights  ? 09/22/21 0750 09/22/21 1130 09/22/21 1312  ?Weight: 69.5 kg 66.5 kg 66.5 kg  ? ? ?Examination: ?General: In bed, NAD, appears comfortable ?HEENT: MM pink/moist, anicteric, atraumatic ?Neuro: RASS 0, PERRL 55mm,  ?CV: S1S2, RRR, no m/r/g appreciated ?PULM:  clear in the upper lobes, clear in the lower lobes, trachea midline, chest expansion symmetric ?GI: soft, bsx4 active, non-tender   ?Extremities: warm/dry, no pretibial edema, capillary refill less than 3 seconds  ?Skin:  no rashes or lesions noted ? ?Hgb 9.5, Hct 30 ?No leukocytosis ? ? ?Resolved Hospital Problem list   ? ? ?Assessment & Plan:  ? ??Hemoptysis ?Patient reports no further hemoptysis since 10AM on 4/25 just prior to HD. EGD with no clear source of hematemesis- Dr. Fuller Plan noted fundic gastropathy as a potential source. On Room air.  ?-Discussed with Dr. Tamala Julian. No indication for  bronch at this time. Feel secondary to volume status.  ?-Please reconsult Pulm if patient has another episode of hemoptysis. ?-Discussed plan to defer bronchoscopy at this time in the absence of hemoptysis with patient and family. Patient agreeable ? ?Lung nodules. ?Seen on CT chest 4/23 ?- Will need radiographic follow with non contrast CT chest in 4-6 months (October 2023) ? ?Acute on chronic diastolic CHF, Hypertension, Hyperlipidemia. ?4/24 ECHO 60 to  65%. RVSF WNL. Patient reports dyspnea which has been slowly worsening over the past few months  ?- per primary team ?-Continue amlodipine, hydralazine, statin, ? ?ESRD on HD M/W/F. ?- primary team consulting nephrology ? ?Pericardial effusion. ?Small effusion present on 4/24 echo. Suspect secondary to ESRD ?-management per primary ? ?Anemia ??secondary to hematemesis vs hemoptysis vs CKD. No signs of active bleeding ?-Transfuse PRBC if HBG less than 7 ?-Obtain AM CBC to trend H&H ?-Monitor for signs of bleeding ? ?Diabetes mellitus type 2. ?-per primary ? ?PCCM will sign off at this time. Please reconsult if any needs arise. ? ?Best Practice (right click and "Reselect all SmartList Selections" daily)  ? ?Per primary ? ?[employer]., MSN, APRN, AGACNP-BC ?[facility]  ?09/23/2021 , 10:04 AM ? ?Please see [email or web address] for pager details ? ?If no response, please call [phone] ?After hours, please call Elink at [phone] ? ? ? ? ? ?

## 2021-09-24 ENCOUNTER — Emergency Department (HOSPITAL_COMMUNITY)
Admission: EM | Admit: 2021-09-24 | Discharge: 2021-09-24 | Disposition: A | Discharge status: Home / Self Care | Payer: Medicare Other | Attending: Emergency Medicine | Admitting: Emergency Medicine

## 2021-09-24 ENCOUNTER — Encounter (HOSPITAL_COMMUNITY): Payer: Self-pay | Admitting: Gastroenterology

## 2021-09-24 ENCOUNTER — Emergency Department (HOSPITAL_COMMUNITY): Payer: Medicare Other

## 2021-09-24 ENCOUNTER — Telehealth: Payer: Self-pay | Admitting: Pulmonary Disease

## 2021-09-24 ENCOUNTER — Encounter: Payer: Self-pay | Admitting: Gastroenterology

## 2021-09-24 ENCOUNTER — Other Ambulatory Visit: Payer: Self-pay

## 2021-09-24 DIAGNOSIS — I509 Heart failure, unspecified: Secondary | ICD-10-CM | POA: Insufficient documentation

## 2021-09-24 DIAGNOSIS — N186 End stage renal disease: Secondary | ICD-10-CM | POA: Diagnosis not present

## 2021-09-24 DIAGNOSIS — E1122 Type 2 diabetes mellitus with diabetic chronic kidney disease: Secondary | ICD-10-CM | POA: Diagnosis not present

## 2021-09-24 DIAGNOSIS — K1379 Other lesions of oral mucosa: Secondary | ICD-10-CM | POA: Diagnosis not present

## 2021-09-24 DIAGNOSIS — K5909 Other constipation: Secondary | ICD-10-CM | POA: Diagnosis not present

## 2021-09-24 DIAGNOSIS — D689 Coagulation defect, unspecified: Secondary | ICD-10-CM | POA: Diagnosis not present

## 2021-09-24 DIAGNOSIS — R911 Solitary pulmonary nodule: Secondary | ICD-10-CM | POA: Diagnosis not present

## 2021-09-24 DIAGNOSIS — R042 Hemoptysis: Secondary | ICD-10-CM

## 2021-09-24 DIAGNOSIS — Z992 Dependence on renal dialysis: Secondary | ICD-10-CM | POA: Insufficient documentation

## 2021-09-24 DIAGNOSIS — R269 Unspecified abnormalities of gait and mobility: Secondary | ICD-10-CM | POA: Diagnosis not present

## 2021-09-24 DIAGNOSIS — I10 Essential (primary) hypertension: Secondary | ICD-10-CM | POA: Diagnosis not present

## 2021-09-24 DIAGNOSIS — R42 Dizziness and giddiness: Secondary | ICD-10-CM | POA: Diagnosis not present

## 2021-09-24 DIAGNOSIS — R110 Nausea: Secondary | ICD-10-CM | POA: Diagnosis not present

## 2021-09-24 DIAGNOSIS — D509 Iron deficiency anemia, unspecified: Secondary | ICD-10-CM | POA: Diagnosis not present

## 2021-09-24 DIAGNOSIS — D631 Anemia in chronic kidney disease: Secondary | ICD-10-CM | POA: Diagnosis not present

## 2021-09-24 DIAGNOSIS — K219 Gastro-esophageal reflux disease without esophagitis: Secondary | ICD-10-CM | POA: Diagnosis not present

## 2021-09-24 DIAGNOSIS — E782 Mixed hyperlipidemia: Secondary | ICD-10-CM | POA: Diagnosis not present

## 2021-09-24 DIAGNOSIS — N2581 Secondary hyperparathyroidism of renal origin: Secondary | ICD-10-CM | POA: Diagnosis not present

## 2021-09-24 LAB — COMPREHENSIVE METABOLIC PANEL
ALT: 11 U/L (ref 0–44)
AST: 20 U/L (ref 15–41)
Albumin: 3.8 g/dL (ref 3.5–5.0)
Alkaline Phosphatase: 76 U/L (ref 38–126)
Anion gap: 11 (ref 5–15)
BUN: 13 mg/dL (ref 8–23)
CO2: 30 mmol/L (ref 22–32)
Calcium: 8.5 mg/dL — ABNORMAL LOW (ref 8.9–10.3)
Chloride: 96 mmol/L — ABNORMAL LOW (ref 98–111)
Creatinine, Ser: 4.45 mg/dL — ABNORMAL HIGH (ref 0.44–1.00)
GFR, Estimated: 10 mL/min — ABNORMAL LOW (ref >60)
Glucose, Bld: 83 mg/dL (ref 70–99)
Potassium: 3.7 mmol/L (ref 3.5–5.1)
Sodium: 137 mmol/L (ref 135–145)
Total Bilirubin: 0.7 mg/dL (ref 0.3–1.2)
Total Protein: 7.5 g/dL (ref 6.5–8.1)

## 2021-09-24 LAB — CBC WITH DIFFERENTIAL/PLATELET
Abs Immature Granulocytes: 0.03 10*3/uL (ref 0.00–0.07)
Basophils Absolute: 0 10*3/uL (ref 0.0–0.1)
Basophils Relative: 0 %
Eosinophils Absolute: 0.1 10*3/uL (ref 0.0–0.5)
Eosinophils Relative: 2 %
HCT: 30.2 % — ABNORMAL LOW (ref 36.0–46.0)
Hemoglobin: 9.3 g/dL — ABNORMAL LOW (ref 12.0–15.0)
Immature Granulocytes: 0 %
Lymphocytes Relative: 14 %
Lymphs Abs: 1 10*3/uL (ref 0.7–4.0)
MCH: 27.7 pg (ref 26.0–34.0)
MCHC: 30.8 g/dL (ref 30.0–36.0)
MCV: 89.9 fL (ref 80.0–100.0)
Monocytes Absolute: 0.6 10*3/uL (ref 0.1–1.0)
Monocytes Relative: 9 %
Neutro Abs: 5 10*3/uL (ref 1.7–7.7)
Neutrophils Relative %: 75 %
Platelets: 123 10*3/uL — ABNORMAL LOW (ref 150–400)
RBC: 3.36 MIL/uL — ABNORMAL LOW (ref 3.87–5.11)
RDW: 16.7 % — ABNORMAL HIGH (ref 11.5–15.5)
WBC: 6.8 10*3/uL (ref 4.0–10.5)
nRBC: 0 % (ref 0.0–0.2)

## 2021-09-24 LAB — TYPE AND SCREEN
ABO/RH(D): A POS
Antibody Screen: NEGATIVE

## 2021-09-24 LAB — SURGICAL PATHOLOGY

## 2021-09-24 LAB — HEPATITIS B SURFACE ANTIBODY,QUALITATIVE

## 2021-09-24 NOTE — ED Provider Notes (Signed)
?[facility] ?Provider Note ? ? ?CSN: [id number] ?Arrival date & time: 09/24/21  1125 ? ?  ? ?History ? ?Chief Complaint  ?Patient presents with  ? Hemoptysis  ? ? ?Rachael Burke is a 66 y.o. female. ? ?HPI ? ? 66 year old Hispanic female with prior history significant for ESRD-HD MWF, anemia, and  HFpEF presenting back to ER after discharge yesterday with recurrent episodes of spitting up blood. History provided with the aid of a Spanish language tele-interpreter. The patient recently underwent a relatively negative EGD after admission with concern for hematemesis vs hemoptysis. Episodes slowed and she was discharged yesterday and advised to return in the event of worsening symptoms. She states that she has been coughing up several clots today. During her previous admission,. A CTA PE study showed an 59mm right ling nodule and vascular congestion. She currently denies any chest pain, fevers or chills. No nausea. She is uncertain if she is coughing blood up, stating "it just shows up." Not on [facility]. ? ?Home Medications ?Prior to Admission medications   ?Medication Sig Start Date End Date Taking? Authorizing Provider  ?acetaminophen (TYLENOL) 325 MG tablet Take 2 tablets (650 mg total) by mouth every 6 (six) hours as needed for mild pain or moderate pain. 09/05/21  Yes Carlisle Cater, PA-C  ?amLODipine (NORVASC) 10 MG tablet Take 10 mg by mouth daily.   Yes [provider]  ?calcium acetate (PHOSLO) 667 MG capsule Take 667 mg by mouth 3 (three) times daily. 04/02/21  Yes [provider]  ?furosemide (LASIX) 80 MG tablet Take 1 tablet (80 mg total) by mouth daily as needed for fluid. 09/23/21  Yes Elgergawy, Silver Huguenin, MD  ?gabapentin (NEURONTIN) 100 MG capsule Take 100 mg by mouth at bedtime. 09/03/21  Yes [provider]  ?labetalol (NORMODYNE) 200 MG tablet Take 2 tablets (400 mg total) by mouth 2 (two) times daily. 09/23/21  Yes Elgergawy, Silver Huguenin, MD   ?linagliptin (TRADJENTA) 5 MG TABS tablet Take 5 mg by mouth daily.   Yes [provider]  ?olopatadine (PATANOL) 0.1 % ophthalmic solution Place 1 drop into both eyes 2 (two) times daily as needed for allergies.   Yes [provider]  ?ondansetron (ZOFRAN-ODT) 4 MG disintegrating tablet Take 1 tablet (4 mg total) by mouth every 8 (eight) hours as needed for nausea or vomiting. 09/05/21  Yes Carlisle Cater, PA-C  ?sucralfate (CARAFATE) 1 GM/10ML suspension Take 1 g by mouth daily as needed (For ulcers). 09/04/21  Yes [provider]  ?atorvastatin (LIPITOR) 20 MG tablet Take 1 tablet (20 mg total) by mouth daily. ?Patient not taking: Reported on 09/22/2021 01/01/20   Charlynne Cousins, MD  ?diclofenac Sodium (VOLTAREN) 1 % GEL Apply 4 g topically 4 (four) times daily. ?Patient not taking: Reported on 09/24/2021 06/18/20   Just, Laurita Quint, FNP  ?escitalopram (LEXAPRO) 10 MG tablet Take 1 tablet (10 mg total) by mouth daily. ?Patient not taking: Reported on 09/24/2021 07/23/20 10/21/20  Horald Pollen, MD  ?hydrALAZINE (APRESOLINE) 25 MG tablet Take 1 tablet (25 mg total) by mouth 3 (three) times daily. ?Patient not taking: Reported on 09/24/2021 09/23/21   Elgergawy, Silver Huguenin, MD  ?pantoprazole (PROTONIX) 40 MG tablet Take 1 tablet (40 mg total) by mouth daily. ?Patient not taking: Reported on 09/24/2021 09/23/21   Elgergawy, Silver Huguenin, MD  ?sitaGLIPtin (JANUVIA) 25 MG tablet Take 1 tablet (25 mg total) by mouth daily. ?Patient not taking:  Reported on 09/24/2021 07/23/20 10/21/20  Horald Pollen, MD  ?   ? ?Allergies    ?Patient has no known allergies.   ? ?Review of Systems   ?Review of Systems  ?All other systems reviewed and are negative. ? ?Physical Exam ?Updated Vital Signs ?BP (!) 176/69   Pulse 69   Temp 98.7 ?F (37.1 ?C) (Oral)   Resp 17   Ht 5\' 3"  (1.6 m)   Wt 66.5 kg   SpO2 98%   BMI 25.97 kg/m?  ?Physical Exam ?Vitals and nursing note reviewed.  ?Constitutional:   ?    General: She is not in acute distress. ?   Appearance: She is well-developed.  ?HENT:  ?   Head: Normocephalic and atraumatic.  ?Eyes:  ?   Conjunctiva/sclera: Conjunctivae normal.  ?Cardiovascular:  ?   Rate and Rhythm: Normal rate and regular rhythm.  ?   Heart sounds: No murmur heard. ?Pulmonary:  ?   Effort: Pulmonary effort is normal. No respiratory distress.  ?   Breath sounds: Normal breath sounds.  ?Abdominal:  ?   Palpations: Abdomen is soft.  ?   Tenderness: There is no abdominal tenderness.  ?Musculoskeletal:     ?   General: No swelling.  ?   Cervical back: Neck supple.  ?Skin: ?   General: Skin is warm and dry.  ?   Capillary Refill: Capillary refill takes less than 2 seconds.  ?Neurological:  ?   Mental Status: She is alert.  ?Psychiatric:     ?   Mood and Affect: Mood normal.  ? ? ?ED Results / Procedures / Treatments   ?Labs ?(all labs ordered are listed, but only abnormal results are displayed) ?Labs Reviewed  ?COMPREHENSIVE METABOLIC PANEL - Abnormal; Notable for the following components:  ?    Result Value  ? Chloride 96 (*)   ? Creatinine, Ser 4.45 (*)   ? Calcium 8.5 (*)   ? GFR, Estimated 10 (*)   ? All other components within normal limits  ?CBC WITH DIFFERENTIAL/PLATELET - Abnormal; Notable for the following components:  ? RBC 3.36 (*)   ? Hemoglobin 9.3 (*)   ? HCT 30.2 (*)   ? RDW 16.7 (*)   ? Platelets 123 (*)   ? All other components within normal limits  ?TYPE AND SCREEN  ? ? ?EKG ?None ? ?Radiology ?DG Chest 2 View ? ?Result Date: 09/24/2021 ?CLINICAL DATA:  Hemoptysis EXAM: CHEST - 2 VIEW COMPARISON:  09/21/2021 FINDINGS: Similar cardiomegaly with central vascular congestion. No current significant edema pattern or CHF. No focal pneumonia, collapse or consolidation. Negative for effusion or pneumothorax. Trachea midline. Aorta atherosclerotic. Remote cholecystectomy and mild degenerative changes of the spine noted. IMPRESSION: Cardiomegaly with vascular congestion Aortic Atherosclerosis  (ICD10-I70.0). Electronically Signed   By: Jerilynn Mages.  Shick M.D.   On: 09/24/2021 12:37   ? ?Procedures ?Procedures  ? ? ?Medications Ordered in ED ?Medications - No data to display ? ?ED Course/ Medical Decision Making/ A&P ?  ?                        ?Medical Decision Making ?Amount and/or Complexity of Data Reviewed ?Labs: ordered. ?Radiology: ordered. ? ? ? 66 year old Hispanic female with prior history significant for ESRD-HD MWF, anemia, and  HFpEF presenting back to ER after discharge yesterday with recurrent episodes of spitting up blood. History provided with the aid of a Spanish language tele-interpreter. The patient  recently underwent a relatively negative EGD after admission with concern for hematemesis vs hemoptysis. Episodes slowed and she was discharged yesterday and advised to return in the event of worsening symptoms. She states that she has been coughing up several clots today. During her previous admission,. A CTA PE study showed an 90mm right ling nodule and vascular congestion. She currently denies any chest pain, fevers or chills. No nausea. She is uncertain if she is coughing blood up, stating "it just shows up." Not on [facility]. ? ?On arrival, vitally stable. Hypertensive to 185/65, afebrile, P 70. Recurrent hemoptysis after DC from her recent hospitalization. Recent negative PE workup.  ? ?Reviewed recent CTA: ?  ?IMPRESSION: ?1. No evidence of pulmonary embolism. ?2. Cardiomegaly with moderate in size pericardial effusion. ?3. Interstitial pulmonary edema with small bilateral pleural ?effusions. ?4. Areas of probable rounded atelectasis in the right middle lobe, ?lingula and the dependent portions of the bilateral lower lobes. ?5. 8 mm soft tissue mass with coarse central calcification in the ?right lower lobe. ?6. Multiple few mm right upper lobe ground-glass subpleural nodules, ?and 3 mm soft tissue subpleural pulmonary nodule in the left lower ?lobe. Non-contrast chest CT at 3-6 months is recommended.  If the ?nodules are stable at time of repeat CT, then future CT at 18-24 ?months (from today's scan) is considered optional for low-risk ?patients, but is recommended for high-risk patients. This ?recommendation

## 2021-09-24 NOTE — Consult Note (Signed)
? ?NAME:  Rachael Burke, MRN:  [id number], DOB:  1956-04-21, LOS: 0 ?ADMISSION DATE:  09/24/2021, CONSULTATION DATE:  09/24/24 ?REFERRING MD:  Dr. Armandina Gemma, CHIEF COMPLAINT:  coughing up blood clots  ? ?History of Present Illness:  ? 66 year old Hispanic female with prior history significant for ESRD-HD MWF, anemia, and  HFpEF presenting back to ER after coughing up blood clots.   ? ?Patient was just discharged yesterday (09/23/24) for evaluation epigastric pain, nausea, and having bloody clots in her mouth (hematemesis vs hemoptysis).  No fever, cough, or dyspnea beyond her baseline.  Blood felt secondary to pulmonary edema/ volume overload vs pulmonary source.  CTA PE was negative for PE but showed 8 mm right lung nodule and vascular congestion.  Underwent EGD on 4/24 with no clear source of hematemesis- Dr. Fuller Plan noted fundic gastropathy as a potential source.  She had no further episodes and no change in hemoglobin, therefore discharged home.  ? ?Video spanish interpreter used as well as daughters at beside.  ? ?Patient report since discharge, she has had one blood clot in her mouth.  Denies coughing up blood, more it just "shows up" in her mouth.  Denies any sinus or nosebleeds.  This morning and when it happened several days ago, it would happen at random times without any provocation.  No change in SOB above her baseline. She went to [facility] this morning and stopped 30 mins early due to her PCP appointment.  Vitals stable, afebrile, slightly hypertensive, normal room air saturations, and Hgb remains stable.  Pulmonary consulted for further recommendations.   She is not on any blood thinners or antiplatelets.   ? ? ? ?Daughter's picture of blood clot this morning ? ?Pertinent  Medical History  ?ESRD on HD M/W/F, Anemia, Anxiety, Arthritis, DM type 2, HLD, HTN, Pneumonia, Diastolic CHF ? ?Significant Hospital Events: ?Including procedures, antibiotic start and stop dates in addition to other pertinent events    ?4/23> 4/25 hospitalized  ?4/26 ER with another bloody episode ?  ?Studies:  ?Echo 07/15/21 >> EF 55 to 60%, prolonged LV relaxation pattern, mild LA/RA dilation, no pericardial effusion ?CT abd/pelvis 09/05/21 >> small effusions with patchy lower lobe infiltrates, cardiomegaly, small pericardial effusion, enlarged liver, atherosclerosis ?CT angio chest 09/21/21 >> cardiomegaly, pericardial effusion, atherosclerosis, interstitial edema, multiple GGO subpleural nodules up to 3 mm, rounded ATX in RML/Lingula, and lower lobes, 8 mm nodule with central calcification RLL, small pleural effusions ?4/24 ECHO 60 to 65%. RVSF WNL.  ? ?Interim History / Subjective:  ? ?Objective   ?Blood pressure (!) 174/80, pulse 73, temperature 98.7 ?F (37.1 ?C), temperature source Oral, resp. rate (!) 24, height 5\' 3"  (1.6 m), weight 66.5 kg, SpO2 100 %. ?   ?   ?No intake or output data in the 24 hours ending 09/24/21 1437 ?Filed Weights  ? 09/24/21 1148  ?Weight: 66.5 kg  ? ?Examination: ?General:  chronically ill older female sitting in ER stretcher with daughters at bedside in NAD ?HEENT: MM pink/moist, no evidence of blood in mouth/ nose ?Neuro:  Awake, oriented, MAE ?CV: rr ?PULM:  non labored, clear ?GI: soft,+bs, NT ?Extremities: warm/dry, no LE edema, LUE AVF -dressing cdi, +b/t ?Skin: no rashes  ? ?Resolved Hospital Problem list   ? ?Assessment & Plan:  ? ?Possible hemoptysis ?- Hemodynamically stable, no dyspnea.  No further episodes since this morning.  Hgb stable.   ?- ok to discharge home.  Outpatient FOB set up for 4/28 at  [facility].  Will need to be NPO after midnight.  Arrive at [facility] pre-op by 0630.   ? ? ?Lung nodule ?- repeat CT chest in 4-6 months.   ? ? ?Labs   ?CBC: ?Recent Labs  ?Lab 09/21/21 ?1007 09/22/21 ?0330 09/23/21 ?0202 09/24/21 ?1245  ?WBC 8.9 9.4 7.3 6.8  ?NEUTROABS 6.8  --   --  5.0  ?HGB 8.6*  9.0* 8.4* 9.5* 9.3*  ?HCT 28.2*  29.5* 26.9* 30.0* 30.2*  ?MCV 91.0 90.0 88.2 89.9  ?PLT 140* 113* 131* 123*  ? ? ?Basic  Metabolic Panel: ?Recent Labs  ?Lab 09/21/21 ?1007 09/21/21 ?2034 09/22/21 ?0330 09/24/21 ?1250  ?NA 138  --  137 137  ?K 4.3  --  4.8 3.7  ?CL 98  --  99 96*  ?CO2 27  --  28 30  ?GLUCOSE 95  --  88 83  ?BUN 29*  --  34* 13  ?CREATININE 7.14*  --  8.44* 4.45*  ?CALCIUM 9.2  --  8.6* 8.5*  ?PHOS  --  4.2  --   --   ? ?GFR: ?Estimated Creatinine Clearance: 11.5 mL/min (A) (by C-G formula based on SCr of 4.45 mg/dL (H)). ?Recent Labs  ?Lab 09/21/21 ?1007 09/22/21 ?0330 09/23/21 ?0202 09/24/21 ?1245  ?WBC 8.9 9.4 7.3 6.8  ? ? ?Liver Function Tests: ?Recent Labs  ?Lab 09/21/21 ?1007 09/24/21 ?1250  ?AST 16 20  ?ALT 11 11  ?ALKPHOS 79 76  ?BILITOT 0.9 0.7  ?PROT 7.5 7.5  ?ALBUMIN 3.8 3.8  ? ?Recent Labs  ?Lab 09/21/21 ?1007  ?LIPASE 40  ? ?No results for input(s): AMMONIA in the last 168 hours. ? ?ABG ?   ?Component Value Date/Time  ? TCO2 20 (L) 08/05/2020 0928  ?  ? ?Coagulation Profile: ?Recent Labs  ?Lab 09/21/21 ?1007  ?INR 1.0  ? ? ?Cardiac Enzymes: ?No results for input(s): CKTOTAL, CKMB, CKMBINDEX, TROPONINI in the last 168 hours. ? ?HbA1C: ?Hgb A1c MFr Bld  ?Date/Time Value Ref Range Status  ?09/21/2021 03:40 PM 4.5 (L) 4.8 - 5.6 % Final  ?  Comment:  ?  (NOTE) ?Pre diabetes:          5.7%-6.4% ? ?Diabetes:              >6.4% ? ?Glycemic control for   <7.0% ?adults with diabetes ?  ?12/29/2019 09:33 AM 5.5 4.8 - 5.6 % Final  ?  Comment:  ?  (NOTE) ?Pre diabetes:          5.7%-6.4% ? ?Diabetes:              >6.4% ? ?Glycemic control for   <7.0% ?adults with diabetes ?  ? ? ?CBG: ?Recent Labs  ?Lab 09/22/21 ?1604 09/22/21 ?1824 09/22/21 ?2041 09/23/21 ?6073 09/23/21 ?1138  ?GLUCAP 64* 102* 99 104* 109*  ? ? ?Review of Systems:   ?Review of Systems  ?Constitutional:  Negative for chills and fever.  ?HENT:  Negative for nosebleeds and sinus pain.   ?Respiratory:  Positive for hemoptysis. Negative for cough, sputum production, shortness of breath and wheezing.   ?Cardiovascular:  Negative for chest pain and leg  swelling.  ?Gastrointestinal:  Negative for abdominal pain, nausea and vomiting.  ?Neurological:  Negative for focal weakness.  ? ?Past Medical History:  ?She,  has a past medical history of Anemia, Anxiety, Arthritis, Diabetes mellitus without complication ([place]), Heart murmur, History of blood transfusion, Hyperlipidemia, Hypertension, Pneumonia, PONV (postoperative nausea and vomiting), and Renal disorder.  ? ?  Surgical History:  ? ?Past Surgical History:  ?Procedure Laterality Date  ? AV FISTULA PLACEMENT Left 05/20/2020  ? Procedure: INSERTION OF LEFT ARM ARTERIOVENOUS (AV) FISTULA;  Surgeon: Marty Heck, MD;  Location: [place];  Service: Vascular;  Laterality: Left;  ? BASCILIC VEIN TRANSPOSITION Left 08/05/2020  ? Procedure: SECOND STAGE BASILIC VEIN TRANSPOSITION;  Surgeon: Marty Heck, MD;  Location: [place];  Service: Vascular;  Laterality: Left;  ? BIOPSY  09/22/2021  ? Procedure: BIOPSY;  Surgeon: Ladene Artist, MD;  Location: [place];  Service: Gastroenterology;;  ? CESAREAN SECTION    ? CHOLECYSTECTOMY    ? ESOPHAGOGASTRODUODENOSCOPY (EGD) WITH PROPOFOL N/A 09/22/2021  ? Procedure: ESOPHAGOGASTRODUODENOSCOPY (EGD) WITH PROPOFOL;  Surgeon: Ladene Artist, MD;  Location: [place];  Service: Gastroenterology;  Laterality: N/A;  ? INSERTION OF DIALYSIS CATHETER N/A 05/20/2020  ? Procedure: INSERTION OF DIALYSIS CATHETER;  Surgeon: Marty Heck, MD;  Location: [place];  Service: Vascular;  Laterality: N/A;  ?  ? ?Social History:  ? reports that she has never smoked. She has never used smokeless tobacco. She reports that she does not drink alcohol and does not use drugs.  ? ?Family History:  ?Her family history includes Diabetes in her brother, brother, mother, and sister; Hypertension in her father.  ? ?Allergies ?No Known Allergies  ? ?Home Medications  ?Prior to Admission medications   ?Medication Sig Start Date End Date Taking? Authorizing Provider  ?acetaminophen (TYLENOL)  325 MG tablet Take 2 tablets (650 mg total) by mouth every 6 (six) hours as needed for mild pain or moderate pain. 09/05/21  Yes Carlisle Cater, PA-C  ?amLODipine (NORVASC) 10 MG tablet Take 10 mg by mouth dai

## 2021-09-24 NOTE — ED Provider Triage Note (Signed)
Emergency Medicine Provider Triage Evaluation Note ? ?Rachael Burke , a 66 y.o. female  was evaluated in triage.  Pt complains of hemoptysis or spitting up blood.  This morning around 1 AM patient had an episode where she either coughed or spit up blood.  Blood was bright red in color and clots were noted.  Patient has not had any episodes since.  Patient does endorse generalized fatigue and some dizziness. ? ?Family are at bedside reports that they were told by gastroenterologist on last appointment that if she has another episode of this patient may need a bronchoscopy.  ? ?Review of Systems  ?Positive: Hemoptysis, dizziness, fatigue ?Negative: Fever, chills, chest pain, shortness of breath, abdominal pain, nausea, vomiting, syncope ? ?Physical Exam  ?BP (!) 185/65 (BP Location: Right Arm)   Pulse 70   Temp 98.7 ?F (37.1 ?C) (Oral)   Resp 16   Ht 5\' 3"  (1.6 m)   Wt 66.5 kg   SpO2 95%   BMI 25.97 kg/m?  ?Gen:   Awake, no distress   ?Resp:  Normal effort, clear to auscultation bilaterally ?MSK:   Moves extremities without difficulty  ?Other:  Abdomen soft, nondistended, nontender with no guarding or rebound tenderness. ? ?Medical Decision Making  ?Medically screening exam initiated at 12:20 PM.  Appropriate orders placed.  Rachael Burke was informed that the remainder of the evaluation will be completed by another provider, this initial triage assessment does not replace that evaluation, and the importance of remaining in the ED until their evaluation is complete. ? ?Renal liver used as interpreter.   ?  ?Rachael Beckwith, PA-C ?09/24/21 1222 ? ?

## 2021-09-24 NOTE — Telephone Encounter (Signed)
PCCM: ? ?Patient in the ED. Being discharged. ? ?Plans for outpatient bronch on Friday AM ? ?Garner Nash, DO ?[facility] ?09/24/2021 3:33 PM   ? ?

## 2021-09-24 NOTE — Discharge Instructions (Addendum)
Please follow-up with pulmonology for outpatient bronchoscopy this Friday AM. will get bronchoscopy completed at [facility] endoscopy. You need to show up for Pre-op at [facility] at 630AM.  ?

## 2021-09-24 NOTE — ED Triage Notes (Signed)
Patient just discharged after having "blood in mouth" had an endoscopy and didn't find anything, family reports she coughed up blood clots this am around 0100 and PCP told her to come here to get an urgent endoscopy.  Patient is not on blood thinners,  ?

## 2021-09-24 NOTE — ED Notes (Signed)
Pt verbalizes understanding of discharge instructions. Opportunity for questions and answers were provided. Pt discharged from the ED.   ?

## 2021-09-25 ENCOUNTER — Encounter (HOSPITAL_COMMUNITY): Payer: Self-pay | Admitting: Internal Medicine

## 2021-09-25 NOTE — Anesthesia Preprocedure Evaluation (Addendum)
Anesthesia Evaluation  ?Patient identified by MRN, date of birth, ID band ?Patient awake ? ? ? ?Reviewed: ?Allergy & Precautions, NPO status , Patient's Chart, lab work & pertinent test results, reviewed documented beta blocker date and time  ? ?History of Anesthesia Complications ?(+) PONV and history of anesthetic complications ? ?Airway ?Mallampati: II ? ?TM Distance: >3 FB ?Neck ROM: Full ? ? ? Dental ? ?(+) Edentulous Upper ?  ?Pulmonary ? ?hemoptysis ?  ?Pulmonary exam normal ? ? ? ? ? ? ? Cardiovascular ?hypertension, Pt. on medications and Pt. on home beta blockers ?Normal cardiovascular exam ? ?TTE 09/22/21: EF 60-65%, mild LVH, moderate pHTN (RVSP 51.49mmHg), small pericardial effusion present near the right atrium, anterior to the right ventricle and circumferential    ?  ?Neuro/Psych ?Anxiety negative neurological ROS ?   ? GI/Hepatic ?Neg liver ROS, GERD  Medicated and Controlled,  ?Endo/Other  ?diabetes, Type 2, Oral Hypoglycemic Agents ? Renal/GU ?Dialysis and ESRFRenal disease  ?negative genitourinary ?  ?Musculoskeletal ? ?(+) Arthritis ,  ? Abdominal ?  ?Peds ? Hematology ? ?(+) Blood dyscrasia (Hgb 9.3, Plt 123k), anemia ,   ?Anesthesia Other Findings ?Day of surgery medications reviewed with patient. ? Reproductive/Obstetrics ?negative OB ROS ? ?  ? ? ? ? ? ? ? ? ? ? ? ? ? ?  ?  ? ? ? ? ? ? ?Anesthesia Physical ?Anesthesia Plan ? ?ASA: 4 ? ?Anesthesia Plan: General  ? ?Post-op Pain Management: Minimal or no pain anticipated  ? ?Induction: Intravenous ? ?PONV Risk Score and Plan: 4 or greater and Midazolam, Treatment may vary due to age or medical condition, Dexamethasone and Ondansetron ? ?Airway Management Planned: Oral ETT ? ?Additional Equipment: None ? ?Intra-op Plan:  ? ?Post-operative Plan: Extubation in OR ? ?Informed Consent: I have reviewed the patients History and Physical, chart, labs and discussed the procedure including the risks, benefits and  alternatives for the proposed anesthesia with the patient or authorized representative who has indicated his/her understanding and acceptance.  ? ? ? ?Dental advisory given and Interpreter used for interveiw ? ?Plan Discussed with: CRNA ? ?Anesthesia Plan Comments:   ? ? ? ? ? ?Anesthesia Quick Evaluation ? ?

## 2021-09-25 NOTE — Progress Notes (Signed)
Used [employer] ID [phone] translation for PAT information and instructions for DOS. ? ?TWO VISITORS ARE ALLOWED TO COME WITH YOU AND STAY IN THE SURGICAL WAITING ROOM ONLY DURING PRE OP AND PROCEDURE DAY OF SURGERY.  ? ?PCP - Raelyn Number, NP ?Cardiologist - n/a ? ?Chest x-ray - 09/24/21 (2V) ?EKG - 09/21/21 ?Stress Test - 07/15/21 CE ?ECHO - 09/22/21 ?Cardiac Cath - n/a ? ?ICD Pacemaker/Loop - n/a ? ?Sleep Study -  n/a ?CPAP - none ? ?Do not take Tradjenta on the morning of surgery. ? ?Patient does not check her blood sugar. ? ?Anesthesia review: Yes ? ?STOP now taking any Aspirin (unless otherwise instructed by your surgeon), Aleve, Naproxen, Ibuprofen, Motrin, Advil, Goody's, BC's, all herbal medications, fish oil, and all vitamins.  ? ?Coronavirus Screening   ?Covid Test on DOS ?Do you have any of the following symptoms:  ?Cough yes/no: No ?Fever (>100.60F)  yes/no: No ?Runny nose yes/no: No ?Sore throat yes/no: No ?Difficulty breathing/shortness of breath  yes/no: No ? ?Have you traveled in the last 14 days and where? yes/no: No ? ?Patient verbalized understanding of instructions that were given via phone using Spanish Interpreter ID # [phone] Sharlet Salina. ?

## 2021-09-26 ENCOUNTER — Ambulatory Visit (HOSPITAL_BASED_OUTPATIENT_CLINIC_OR_DEPARTMENT_OTHER): Payer: Medicare Other | Admitting: Physician Assistant

## 2021-09-26 ENCOUNTER — Ambulatory Visit (HOSPITAL_COMMUNITY): Payer: Medicare Other | Admitting: Physician Assistant

## 2021-09-26 ENCOUNTER — Encounter (HOSPITAL_COMMUNITY): Payer: Self-pay | Admitting: Internal Medicine

## 2021-09-26 ENCOUNTER — Telehealth: Payer: Self-pay | Admitting: Internal Medicine

## 2021-09-26 ENCOUNTER — Other Ambulatory Visit: Payer: Self-pay

## 2021-09-26 ENCOUNTER — Encounter (HOSPITAL_COMMUNITY)
Admission: RE | Disposition: A | Discharge status: Home / Self Care | Payer: Self-pay | Source: Home / Self Care | Attending: Internal Medicine

## 2021-09-26 ENCOUNTER — Ambulatory Visit (HOSPITAL_COMMUNITY)
Admission: RE | Admit: 2021-09-26 | Discharge: 2021-09-26 | Disposition: A | Discharge status: Home / Self Care | Payer: Medicare Other | Attending: Internal Medicine | Admitting: Internal Medicine

## 2021-09-26 DIAGNOSIS — I5033 Acute on chronic diastolic (congestive) heart failure: Secondary | ICD-10-CM | POA: Insufficient documentation

## 2021-09-26 DIAGNOSIS — I120 Hypertensive chronic kidney disease with stage 5 chronic kidney disease or end stage renal disease: Secondary | ICD-10-CM | POA: Diagnosis not present

## 2021-09-26 DIAGNOSIS — E785 Hyperlipidemia, unspecified: Secondary | ICD-10-CM | POA: Diagnosis not present

## 2021-09-26 DIAGNOSIS — I3139 Other pericardial effusion (noninflammatory): Secondary | ICD-10-CM | POA: Diagnosis not present

## 2021-09-26 DIAGNOSIS — K1379 Other lesions of oral mucosa: Secondary | ICD-10-CM

## 2021-09-26 DIAGNOSIS — K219 Gastro-esophageal reflux disease without esophagitis: Secondary | ICD-10-CM | POA: Diagnosis not present

## 2021-09-26 DIAGNOSIS — N186 End stage renal disease: Secondary | ICD-10-CM | POA: Insufficient documentation

## 2021-09-26 DIAGNOSIS — E1122 Type 2 diabetes mellitus with diabetic chronic kidney disease: Secondary | ICD-10-CM

## 2021-09-26 DIAGNOSIS — Z7984 Long term (current) use of oral hypoglycemic drugs: Secondary | ICD-10-CM | POA: Insufficient documentation

## 2021-09-26 DIAGNOSIS — R042 Hemoptysis: Secondary | ICD-10-CM | POA: Diagnosis not present

## 2021-09-26 DIAGNOSIS — D689 Coagulation defect, unspecified: Secondary | ICD-10-CM | POA: Diagnosis not present

## 2021-09-26 DIAGNOSIS — Z20822 Contact with and (suspected) exposure to covid-19: Secondary | ICD-10-CM | POA: Diagnosis not present

## 2021-09-26 DIAGNOSIS — D509 Iron deficiency anemia, unspecified: Secondary | ICD-10-CM | POA: Diagnosis not present

## 2021-09-26 DIAGNOSIS — Z992 Dependence on renal dialysis: Secondary | ICD-10-CM | POA: Diagnosis not present

## 2021-09-26 DIAGNOSIS — D649 Anemia, unspecified: Secondary | ICD-10-CM | POA: Insufficient documentation

## 2021-09-26 DIAGNOSIS — I132 Hypertensive heart and chronic kidney disease with heart failure and with stage 5 chronic kidney disease, or end stage renal disease: Secondary | ICD-10-CM | POA: Insufficient documentation

## 2021-09-26 DIAGNOSIS — R918 Other nonspecific abnormal finding of lung field: Secondary | ICD-10-CM | POA: Insufficient documentation

## 2021-09-26 DIAGNOSIS — R110 Nausea: Secondary | ICD-10-CM | POA: Diagnosis not present

## 2021-09-26 DIAGNOSIS — N2581 Secondary hyperparathyroidism of renal origin: Secondary | ICD-10-CM | POA: Diagnosis not present

## 2021-09-26 DIAGNOSIS — M26629 Arthralgia of temporomandibular joint, unspecified side: Secondary | ICD-10-CM

## 2021-09-26 DIAGNOSIS — D631 Anemia in chronic kidney disease: Secondary | ICD-10-CM | POA: Diagnosis not present

## 2021-09-26 HISTORY — PX: VIDEO BRONCHOSCOPY: SHX5072

## 2021-09-26 LAB — POCT I-STAT, CHEM 8
BUN: 39 mg/dL — ABNORMAL HIGH (ref 8–23)
Calcium, Ion: 1.07 mmol/L — ABNORMAL LOW (ref 1.15–1.40)
Chloride: 98 mmol/L (ref 98–111)
Creatinine, Ser: 9.1 mg/dL — ABNORMAL HIGH (ref 0.44–1.00)
Glucose, Bld: 103 mg/dL — ABNORMAL HIGH (ref 70–99)
HCT: 31 % — ABNORMAL LOW (ref 36.0–46.0)
Hemoglobin: 10.5 g/dL — ABNORMAL LOW (ref 12.0–15.0)
Potassium: 4.4 mmol/L (ref 3.5–5.1)
Sodium: 138 mmol/L (ref 135–145)
TCO2: 31 mmol/L (ref 22–32)

## 2021-09-26 LAB — SARS CORONAVIRUS 2 BY RT PCR: SARS Coronavirus 2 by RT PCR: NEGATIVE

## 2021-09-26 LAB — GLUCOSE, CAPILLARY
Glucose-Capillary: 106 mg/dL — ABNORMAL HIGH (ref 70–99)
Glucose-Capillary: 105 mg/dL — ABNORMAL HIGH (ref 70–99)
Glucose-Capillary: 98 mg/dL (ref 70–99)

## 2021-09-26 SURGERY — VIDEO BRONCHOSCOPY WITHOUT FLUORO
Anesthesia: General | Laterality: Bilateral

## 2021-09-26 MED ORDER — LIDOCAINE 2% (20 MG/ML) 5 ML SYRINGE
INTRAMUSCULAR | Status: DC | PRN
  Administered 2021-09-26: 60 mg via INTRAVENOUS

## 2021-09-26 MED ORDER — CHLORHEXIDINE GLUCONATE 0.12 % MT SOLN
OROMUCOSAL | Status: AC
  Administered 2021-09-26: 15 mL
  Filled 2021-09-26: qty 15

## 2021-09-26 MED ORDER — PROPOFOL 10 MG/ML IV BOLUS
INTRAVENOUS | Status: DC | PRN
  Administered 2021-09-26: 160 mg via INTRAVENOUS

## 2021-09-26 MED ORDER — DROPERIDOL 2.5 MG/ML IJ SOLN: 0.6250 mg | Freq: Once | INTRAMUSCULAR | Status: DC | PRN

## 2021-09-26 MED ORDER — MIDAZOLAM HCL 2 MG/2ML IJ SOLN
INTRAMUSCULAR | Status: DC | PRN
  Administered 2021-09-26: 2 mg via INTRAVENOUS

## 2021-09-26 MED ORDER — CHLORHEXIDINE GLUCONATE 0.12 % MT SOLN: 15.0000 mL | Freq: Once | OROMUCOSAL | Status: DC

## 2021-09-26 MED ORDER — ONDANSETRON HCL 4 MG/2ML IJ SOLN
INTRAMUSCULAR | Status: DC | PRN
  Administered 2021-09-26: 4 mg via INTRAVENOUS

## 2021-09-26 MED ORDER — INSULIN ASPART 100 UNIT/ML IJ SOLN: 0.0000 [IU] | INTRAMUSCULAR | Status: DC | PRN

## 2021-09-26 MED ORDER — SODIUM CHLORIDE 0.9 % IV SOLN: INTRAVENOUS | Status: DC

## 2021-09-26 MED ORDER — DEXAMETHASONE SODIUM PHOSPHATE 10 MG/ML IJ SOLN
INTRAMUSCULAR | Status: DC | PRN
  Administered 2021-09-26: 5 mg via INTRAVENOUS

## 2021-09-26 MED ORDER — FENTANYL CITRATE (PF) 100 MCG/2ML IJ SOLN
INTRAMUSCULAR | Status: DC | PRN
  Administered 2021-09-26: 50 ug via INTRAVENOUS

## 2021-09-26 MED ORDER — SUGAMMADEX SODIUM 200 MG/2ML IV SOLN
INTRAVENOUS | Status: DC | PRN
  Administered 2021-09-26: 100 mg via INTRAVENOUS
  Administered 2021-09-26: 200 mg via INTRAVENOUS

## 2021-09-26 MED ORDER — ROCURONIUM BROMIDE 10 MG/ML (PF) SYRINGE
PREFILLED_SYRINGE | INTRAVENOUS | Status: DC | PRN
  Administered 2021-09-26: 40 mg via INTRAVENOUS

## 2021-09-26 MED ORDER — FENTANYL CITRATE (PF) 100 MCG/2ML IJ SOLN: 25.0000 ug | INTRAMUSCULAR | Status: DC | PRN

## 2021-09-26 NOTE — Transfer of Care (Signed)
Immediate Anesthesia Transfer of Care Note ? ?Patient: Rachael Burke ? ?Procedure(s) Performed: VIDEO BRONCHOSCOPY WITHOUT FLUORO (Bilateral) ? ?Patient Location: PACU ? ?Anesthesia Type:General ? ?Level of Consciousness: drowsy and patient cooperative ? ?Airway & Oxygen Therapy: Patient Spontanous Breathing and Patient connected to nasal cannula oxygen ? ?Post-op Assessment: Report given to RN ? ?Post vital signs: Reviewed and stable ? ?Last Vitals:  ?Vitals Value Taken Time  ?BP 142/58 09/26/21 1059  ?Temp    ?Pulse 64 09/26/21 1059  ?Resp 12 09/26/21 1059  ?SpO2 95 % 09/26/21 1059  ?Vitals shown include unvalidated device data. ? ?Last Pain:  ?Vitals:  ? 09/26/21 0652  ?TempSrc:   ?PainSc: 0-No pain  ?   ? ?  ? ?Complications: No notable events documented. ?

## 2021-09-26 NOTE — Interval H&P Note (Signed)
Seen for bronchoscopy. ?Still coughing up blood. ?Exam benign. ?Consents for bronchoscopy with airway inspection and biopsy if needed. ?Translator used. ? ?Erskine Emery MD PCCM ?

## 2021-09-26 NOTE — Progress Notes (Signed)
No sources of bleeding seen from nose/mouth/larynx/trachea/bronchi. ? ?Will get CT neck/sinus to complete workup and have her follow up in pulm clinic in 1-2 weeks.  Daughter updated. ? ?Erskine Emery MD PCCM ?

## 2021-09-26 NOTE — Anesthesia Procedure Notes (Signed)
Procedure Name: Intubation ?Date/Time: 09/26/2021 10:32 AM ?Performed by: Barrington Ellison, CRNA ?Pre-anesthesia Checklist: Patient identified, Emergency Drugs available, Suction available and Patient being monitored ?Patient Re-evaluated:Patient Re-evaluated prior to induction ?Oxygen Delivery Method: Circle System Utilized ?Preoxygenation: Pre-oxygenation with 100% oxygen ?Induction Type: IV induction ?Ventilation: Mask ventilation without difficulty ?Laryngoscope Size: Glidescope and 3 ?Grade View: Grade I ?Tube type: Oral ?Tube size: 8.5 mm ?Number of attempts: 1 ?Airway Equipment and Method: Stylet and Oral airway ?Placement Confirmation: ETT inserted through vocal cords under direct vision, positive ETCO2 and breath sounds checked- equal and bilateral ?Secured at: 21 cm ?Tube secured with: Tape ?Dental Injury: Teeth and Oropharynx as per pre-operative assessment  ?Comments: Elective Glidescope used so physician could visualize the oral and pharyngeal mucosas.  ? ? ? ? ?

## 2021-09-26 NOTE — Progress Notes (Signed)
Spoke with patient's daughter and gave update. ?

## 2021-09-26 NOTE — Discharge Instructions (Signed)
-   CT de cuello y senos paranasales ?- El seguimiento en la cl?nica se organizar? en 1-2 semanas ?

## 2021-09-26 NOTE — Op Note (Signed)
Bronchoscopy Procedure Note ? ?Rachael Burke  ?[id number]  ?01-Sep-1955 ? ?Date:09/26/21  ?Time:10:52 AM  ? ?Provider Performing:Ritter Helsley C Tamala Julian  ? ?Procedure(s):  Flexible Bronchoscopy (88891) ? ?Indication(s) ?Hemoptysis ? ?Consent ?Risks of the procedure as well as the alternatives and risks of each were explained to the patient and/or caregiver.  Consent for the procedure was obtained and is signed in the bedside chart ? ?Anesthesia ?General ? ? ?Time Out ?Verified patient identification, verified procedure, site/side was marked, verified correct patient position, special equipment/implants available, medications/allergies/relevant history reviewed, required imaging and test results available. ? ? ?Sterile Technique ?Usual hand hygiene, masks, gowns, and gloves were used ? ? ?Procedure Description ?Present with anesthesia as glidescope used for intubation, upper airway/tonsils/mouth appeared normal with no obvious sources of bleeding.  Advanced bronchoscope down ETT and airways examined to subsegmental level: no bleeding found.  No endobronchial masses.  Both nares were examined as well as the mouth again with bronchoscope.  No active bleeding or abnormalities seen. ? ? ?Complications/Tolerance ?None; patient tolerated the procedure well. ?Chest X-ray is not needed post procedure. ? ? ?EBL ?Minimal ? ? ?Specimen(s) ?None ? ?

## 2021-09-26 NOTE — Telephone Encounter (Signed)
Appointment scheduled on 10/08/2021 at 12pm with Geraldo Pitter, NP- appt reminder mailed to address on file. Nothing further needed.  ?

## 2021-09-26 NOTE — Anesthesia Postprocedure Evaluation (Signed)
Anesthesia Post Note ? ?Patient: Rachael Burke ? ?Procedure(s) Performed: VIDEO BRONCHOSCOPY WITHOUT FLUORO (Bilateral) ? ?  ? ?Patient location during evaluation: PACU ?Anesthesia Type: General ?Level of consciousness: awake and alert ?Pain management: pain level controlled ?Vital Signs Assessment: post-procedure vital signs reviewed and stable ?Respiratory status: spontaneous breathing, nonlabored ventilation and respiratory function stable ?Cardiovascular status: blood pressure returned to baseline ?Postop Assessment: no apparent nausea or vomiting ?Anesthetic complications: no ? ? ?No notable events documented. ? ?Last Vitals:  ?Vitals:  ? 09/26/21 1115 09/26/21 1130  ?BP: (!) 142/62 (!) 144/66  ?Pulse: 63 64  ?Resp: 17 15  ?Temp:  36.8 ?C  ?SpO2: 94% 95%  ?  ?Last Pain:  ?Vitals:  ? 09/26/21 1130  ?TempSrc:   ?PainSc: 0-No pain  ? ? ?  ?  ?  ?  ?  ?  ? ?[name] ? ? ? ? ?

## 2021-09-28 DIAGNOSIS — E1129 Type 2 diabetes mellitus with other diabetic kidney complication: Secondary | ICD-10-CM | POA: Diagnosis not present

## 2021-09-28 DIAGNOSIS — Z992 Dependence on renal dialysis: Secondary | ICD-10-CM | POA: Diagnosis not present

## 2021-09-28 DIAGNOSIS — N186 End stage renal disease: Secondary | ICD-10-CM | POA: Diagnosis not present

## 2021-09-29 DIAGNOSIS — R52 Pain, unspecified: Secondary | ICD-10-CM | POA: Diagnosis not present

## 2021-09-29 DIAGNOSIS — Z992 Dependence on renal dialysis: Secondary | ICD-10-CM | POA: Diagnosis not present

## 2021-09-29 DIAGNOSIS — D509 Iron deficiency anemia, unspecified: Secondary | ICD-10-CM | POA: Diagnosis not present

## 2021-09-29 DIAGNOSIS — N2581 Secondary hyperparathyroidism of renal origin: Secondary | ICD-10-CM | POA: Diagnosis not present

## 2021-09-29 DIAGNOSIS — D631 Anemia in chronic kidney disease: Secondary | ICD-10-CM | POA: Diagnosis not present

## 2021-09-29 DIAGNOSIS — N186 End stage renal disease: Secondary | ICD-10-CM | POA: Diagnosis not present

## 2021-09-30 DIAGNOSIS — Z23 Encounter for immunization: Secondary | ICD-10-CM | POA: Diagnosis not present

## 2021-09-30 DIAGNOSIS — Z992 Dependence on renal dialysis: Secondary | ICD-10-CM | POA: Diagnosis not present

## 2021-09-30 DIAGNOSIS — Z01818 Encounter for other preprocedural examination: Secondary | ICD-10-CM | POA: Diagnosis not present

## 2021-09-30 DIAGNOSIS — N186 End stage renal disease: Secondary | ICD-10-CM | POA: Diagnosis not present

## 2021-10-01 DIAGNOSIS — N2581 Secondary hyperparathyroidism of renal origin: Secondary | ICD-10-CM | POA: Diagnosis not present

## 2021-10-01 DIAGNOSIS — N186 End stage renal disease: Secondary | ICD-10-CM | POA: Diagnosis not present

## 2021-10-01 DIAGNOSIS — D509 Iron deficiency anemia, unspecified: Secondary | ICD-10-CM | POA: Diagnosis not present

## 2021-10-01 DIAGNOSIS — D631 Anemia in chronic kidney disease: Secondary | ICD-10-CM | POA: Diagnosis not present

## 2021-10-01 DIAGNOSIS — Z992 Dependence on renal dialysis: Secondary | ICD-10-CM | POA: Diagnosis not present

## 2021-10-01 DIAGNOSIS — R52 Pain, unspecified: Secondary | ICD-10-CM | POA: Diagnosis not present

## 2021-10-01 LAB — HEPATITIS B SURFACE ANTIGEN: Hepatitis B Surface Ag: REACTIVE — AB

## 2021-10-02 DIAGNOSIS — Z992 Dependence on renal dialysis: Secondary | ICD-10-CM | POA: Diagnosis not present

## 2021-10-02 DIAGNOSIS — K5909 Other constipation: Secondary | ICD-10-CM | POA: Diagnosis not present

## 2021-10-02 DIAGNOSIS — R269 Unspecified abnormalities of gait and mobility: Secondary | ICD-10-CM | POA: Diagnosis not present

## 2021-10-02 DIAGNOSIS — K219 Gastro-esophageal reflux disease without esophagitis: Secondary | ICD-10-CM | POA: Diagnosis not present

## 2021-10-02 DIAGNOSIS — R42 Dizziness and giddiness: Secondary | ICD-10-CM | POA: Diagnosis not present

## 2021-10-02 DIAGNOSIS — I10 Essential (primary) hypertension: Secondary | ICD-10-CM | POA: Diagnosis not present

## 2021-10-02 DIAGNOSIS — K1379 Other lesions of oral mucosa: Secondary | ICD-10-CM | POA: Diagnosis not present

## 2021-10-02 DIAGNOSIS — E1122 Type 2 diabetes mellitus with diabetic chronic kidney disease: Secondary | ICD-10-CM | POA: Diagnosis not present

## 2021-10-02 DIAGNOSIS — Z0000 Encounter for general adult medical examination without abnormal findings: Secondary | ICD-10-CM | POA: Diagnosis not present

## 2021-10-02 DIAGNOSIS — N186 End stage renal disease: Secondary | ICD-10-CM | POA: Diagnosis not present

## 2021-10-02 DIAGNOSIS — E782 Mixed hyperlipidemia: Secondary | ICD-10-CM | POA: Diagnosis not present

## 2021-10-03 DIAGNOSIS — N2581 Secondary hyperparathyroidism of renal origin: Secondary | ICD-10-CM | POA: Diagnosis not present

## 2021-10-03 DIAGNOSIS — Z992 Dependence on renal dialysis: Secondary | ICD-10-CM | POA: Diagnosis not present

## 2021-10-03 DIAGNOSIS — D509 Iron deficiency anemia, unspecified: Secondary | ICD-10-CM | POA: Diagnosis not present

## 2021-10-03 DIAGNOSIS — D631 Anemia in chronic kidney disease: Secondary | ICD-10-CM | POA: Diagnosis not present

## 2021-10-03 DIAGNOSIS — N186 End stage renal disease: Secondary | ICD-10-CM | POA: Diagnosis not present

## 2021-10-03 DIAGNOSIS — R52 Pain, unspecified: Secondary | ICD-10-CM | POA: Diagnosis not present

## 2021-10-06 DIAGNOSIS — Z992 Dependence on renal dialysis: Secondary | ICD-10-CM | POA: Diagnosis not present

## 2021-10-06 DIAGNOSIS — D509 Iron deficiency anemia, unspecified: Secondary | ICD-10-CM | POA: Diagnosis not present

## 2021-10-06 DIAGNOSIS — D631 Anemia in chronic kidney disease: Secondary | ICD-10-CM | POA: Diagnosis not present

## 2021-10-06 DIAGNOSIS — N186 End stage renal disease: Secondary | ICD-10-CM | POA: Diagnosis not present

## 2021-10-06 DIAGNOSIS — R52 Pain, unspecified: Secondary | ICD-10-CM | POA: Diagnosis not present

## 2021-10-06 DIAGNOSIS — N2581 Secondary hyperparathyroidism of renal origin: Secondary | ICD-10-CM | POA: Diagnosis not present

## 2021-10-07 ENCOUNTER — Ambulatory Visit (HOSPITAL_COMMUNITY)
Admission: RE | Admit: 2021-10-07 | Discharge: 2021-10-07 | Disposition: A | Discharge status: Home / Self Care | Payer: Medicare Other | Source: Ambulatory Visit | Attending: Internal Medicine | Admitting: Internal Medicine

## 2021-10-07 ENCOUNTER — Ambulatory Visit (HOSPITAL_COMMUNITY): Admission: RE | Admit: 2021-10-07 | Payer: Medicare Other | Source: Ambulatory Visit

## 2021-10-07 DIAGNOSIS — K1379 Other lesions of oral mucosa: Secondary | ICD-10-CM | POA: Diagnosis not present

## 2021-10-07 DIAGNOSIS — R599 Enlarged lymph nodes, unspecified: Secondary | ICD-10-CM | POA: Diagnosis not present

## 2021-10-07 DIAGNOSIS — M26629 Arthralgia of temporomandibular joint, unspecified side: Secondary | ICD-10-CM | POA: Insufficient documentation

## 2021-10-07 DIAGNOSIS — R041 Hemorrhage from throat: Secondary | ICD-10-CM | POA: Diagnosis not present

## 2021-10-07 DIAGNOSIS — M2669 Other specified disorders of temporomandibular joint: Secondary | ICD-10-CM | POA: Diagnosis not present

## 2021-10-07 MED ORDER — IOHEXOL 300 MG/ML  SOLN
75.0000 mL | Freq: Once | INTRAMUSCULAR | Status: AC | PRN
  Administered 2021-10-07: 75 mL via INTRAVENOUS

## 2021-10-07 MED ORDER — SODIUM CHLORIDE (PF) 0.9 % IJ SOLN
INTRAMUSCULAR | Status: AC
  Filled 2021-10-07: qty 50

## 2021-10-08 ENCOUNTER — Inpatient Hospital Stay: Payer: Medicare Other | Admitting: Primary Care

## 2021-10-08 DIAGNOSIS — D631 Anemia in chronic kidney disease: Secondary | ICD-10-CM | POA: Diagnosis not present

## 2021-10-08 DIAGNOSIS — R52 Pain, unspecified: Secondary | ICD-10-CM | POA: Diagnosis not present

## 2021-10-08 DIAGNOSIS — Z992 Dependence on renal dialysis: Secondary | ICD-10-CM | POA: Diagnosis not present

## 2021-10-08 DIAGNOSIS — D509 Iron deficiency anemia, unspecified: Secondary | ICD-10-CM | POA: Diagnosis not present

## 2021-10-08 DIAGNOSIS — N2581 Secondary hyperparathyroidism of renal origin: Secondary | ICD-10-CM | POA: Diagnosis not present

## 2021-10-08 DIAGNOSIS — N186 End stage renal disease: Secondary | ICD-10-CM | POA: Diagnosis not present

## 2021-10-10 ENCOUNTER — Encounter: Payer: Medicare Other | Admitting: Primary Care

## 2021-10-10 DIAGNOSIS — D509 Iron deficiency anemia, unspecified: Secondary | ICD-10-CM | POA: Diagnosis not present

## 2021-10-10 DIAGNOSIS — R52 Pain, unspecified: Secondary | ICD-10-CM | POA: Diagnosis not present

## 2021-10-10 DIAGNOSIS — Z992 Dependence on renal dialysis: Secondary | ICD-10-CM | POA: Diagnosis not present

## 2021-10-10 DIAGNOSIS — N2581 Secondary hyperparathyroidism of renal origin: Secondary | ICD-10-CM | POA: Diagnosis not present

## 2021-10-10 DIAGNOSIS — D631 Anemia in chronic kidney disease: Secondary | ICD-10-CM | POA: Diagnosis not present

## 2021-10-10 DIAGNOSIS — N186 End stage renal disease: Secondary | ICD-10-CM | POA: Diagnosis not present

## 2021-10-10 NOTE — Progress Notes (Signed)
 This encounter was created in error - please disregard.

## 2021-10-10 NOTE — Patient Instructions (Signed)
She had CT maxillofacial on 10/07/21 that showed normal appearance of temporomandibular joints. Sinuses were essentially clear, only trace mucosal thickenings along right maxillary flor.  ? ?CT neck showed no evidence of mass or inflammation. No visible source of bleeding.  ? ? ?

## 2021-10-13 ENCOUNTER — Other Ambulatory Visit: Payer: Self-pay

## 2021-10-13 ENCOUNTER — Encounter: Payer: Self-pay | Admitting: Primary Care

## 2021-10-13 ENCOUNTER — Emergency Department (HOSPITAL_COMMUNITY)
Admission: EM | Admit: 2021-10-13 | Discharge: 2021-10-14 | Disposition: A | Discharge status: Home / Self Care | Payer: Medicare Other | Attending: Emergency Medicine | Admitting: Emergency Medicine

## 2021-10-13 ENCOUNTER — Ambulatory Visit (INDEPENDENT_AMBULATORY_CARE_PROVIDER_SITE_OTHER): Payer: Medicare Other | Admitting: Primary Care

## 2021-10-13 ENCOUNTER — Encounter (HOSPITAL_COMMUNITY): Payer: Self-pay | Admitting: Emergency Medicine

## 2021-10-13 DIAGNOSIS — H9201 Otalgia, right ear: Secondary | ICD-10-CM | POA: Diagnosis not present

## 2021-10-13 DIAGNOSIS — R918 Other nonspecific abnormal finding of lung field: Secondary | ICD-10-CM | POA: Diagnosis not present

## 2021-10-13 DIAGNOSIS — R110 Nausea: Secondary | ICD-10-CM | POA: Insufficient documentation

## 2021-10-13 DIAGNOSIS — R1032 Left lower quadrant pain: Secondary | ICD-10-CM | POA: Diagnosis not present

## 2021-10-13 DIAGNOSIS — Z79899 Other long term (current) drug therapy: Secondary | ICD-10-CM | POA: Diagnosis not present

## 2021-10-13 DIAGNOSIS — R1030 Lower abdominal pain, unspecified: Secondary | ICD-10-CM | POA: Diagnosis present

## 2021-10-13 DIAGNOSIS — M9905 Segmental and somatic dysfunction of pelvic region: Secondary | ICD-10-CM | POA: Insufficient documentation

## 2021-10-13 DIAGNOSIS — Z992 Dependence on renal dialysis: Secondary | ICD-10-CM | POA: Diagnosis not present

## 2021-10-13 DIAGNOSIS — I120 Hypertensive chronic kidney disease with stage 5 chronic kidney disease or end stage renal disease: Secondary | ICD-10-CM | POA: Insufficient documentation

## 2021-10-13 DIAGNOSIS — D509 Iron deficiency anemia, unspecified: Secondary | ICD-10-CM | POA: Diagnosis not present

## 2021-10-13 DIAGNOSIS — D631 Anemia in chronic kidney disease: Secondary | ICD-10-CM | POA: Diagnosis not present

## 2021-10-13 DIAGNOSIS — E1122 Type 2 diabetes mellitus with diabetic chronic kidney disease: Secondary | ICD-10-CM | POA: Diagnosis not present

## 2021-10-13 DIAGNOSIS — N186 End stage renal disease: Secondary | ICD-10-CM | POA: Diagnosis not present

## 2021-10-13 DIAGNOSIS — M6289 Other specified disorders of muscle: Secondary | ICD-10-CM

## 2021-10-13 DIAGNOSIS — R52 Pain, unspecified: Secondary | ICD-10-CM | POA: Diagnosis not present

## 2021-10-13 DIAGNOSIS — R339 Retention of urine, unspecified: Secondary | ICD-10-CM | POA: Insufficient documentation

## 2021-10-13 DIAGNOSIS — N819 Female genital prolapse, unspecified: Secondary | ICD-10-CM | POA: Diagnosis not present

## 2021-10-13 DIAGNOSIS — R042 Hemoptysis: Secondary | ICD-10-CM | POA: Diagnosis not present

## 2021-10-13 DIAGNOSIS — I700 Atherosclerosis of aorta: Secondary | ICD-10-CM | POA: Diagnosis not present

## 2021-10-13 DIAGNOSIS — N2581 Secondary hyperparathyroidism of renal origin: Secondary | ICD-10-CM | POA: Diagnosis not present

## 2021-10-13 NOTE — ED Triage Notes (Signed)
Pt reports being hemodialysis pt with M-W-F treatments. States she received full treatment today.  ?

## 2021-10-13 NOTE — Assessment & Plan Note (Addendum)
-   Resolved; Patient underwent bronchoscopy with Dr. Tamala Julian on 09/26/21 for hemoptysis. Exam was benign. No sources of bleeding seen from nose, mouth, larynx, trachea or bronchi. CT sinuses and neck were essentially normal. . She is no longer coughing up blood. Daughter has noticed bleeding around her gums, she has fixed lower dentures. Advised she follow up with dentist for oral exam/evaluation.  ?

## 2021-10-13 NOTE — Patient Instructions (Addendum)
Orders: ?CT chest wo contrast in 4-6 months re: pulmonary nodule ? ?Referral: ?ENT re: ear pain/vertigo  ? ?Follow-up: ?4 months with Dr. Halford Chessman (after CT chest) or sooner if hemoptysis returns  ?

## 2021-10-13 NOTE — Assessment & Plan Note (Signed)
-   Needs CT chest in 4-6 months to follow-up on pulmonary nodules  ?

## 2021-10-13 NOTE — Assessment & Plan Note (Signed)
-   Referring to ENT for right ear pain and associated vertigo  ?

## 2021-10-13 NOTE — ED Provider Triage Note (Signed)
Emergency Medicine Provider Triage Evaluation Note ? ?Rachael Burke , a 66 y.o. female  was evaluated in triage.  Pt complains of suprapubic pain that began earlier today.  She has burning with urination but only a small amount of a time and when she does she feels like her bladder is going to fall out.  Denies fever.  Also some constipation recently.  ESRD on HD, had full treatment today (M, W, F schedule). ? ?Review of Systems  ?Positive: Suprapubic pain, dysuria ?Negative: fever ? ?Physical Exam  ?BP (!) 207/74   Pulse 69   Temp 98 ?F (36.7 ?C) (Oral)   Resp 18   SpO2 99%  ? ?Gen:   Awake, no distress   ?Resp:  Normal effort  ?MSK:   Moves extremities without difficulty  ?Other:   ? ?Medical Decision Making  ?Medically screening exam initiated at 11:52 PM.  Appropriate orders placed.  Rachael Burke was informed that the remainder of the evaluation will be completed by another provider, this initial triage assessment does not replace that evaluation, and the importance of remaining in the ED until their evaluation is complete. ? ?Suprapubic pain, dysuria, "bulge" when urinating.  Possible cystocele vs uterine prolapse, pelvic not done in triage.  Also very hypertensive in triage, may be related to pain.  Hx of ESRD on HD, had treatment today.   Will check labs, UA with culture.  ?  ?Rachael Pickett, PA-C ?10/13/21 2358 ? ?

## 2021-10-13 NOTE — Progress Notes (Signed)
? ?@Patient  ID: Rachael Burke, female    DOB: 05-31-1956, 66 y.o.   MRN: [id number] ? ?Chief Complaint  ?Patient presents with  ? Hospitalization Follow-up  ? ? ?Referring provider: ?Trey Sailors, PA ? ?HPI: ?66 year old, never smoked. PMH significant for type 2 diabetes, HTN, CAD, hyperlipidemia, hemoptysis. Seen by Dr. Halford Chessman and Dr. Valeta Harms during two separate admissions for consult d/t hemoptysis.  ? ?10/10/2021- Interim hx  ?Patient presents today for hospital follow-up/hemoptysis. Patient underwent bronchoscopy with Dr. Tamala Julian on 09/26/21 for hemoptysis. Exam was benign. No sources of bleeding seen from nose, mouth, larynx, trachea or bronchi. She had CT maxillofacial on 10/07/21 that showed normal appearance of temporomandibular joints. Sinuses were essentially clear, only trace mucosal thickenings along right maxillary floor. CT neck showed no evidence of mass or inflammation. No visible source of bleeding. She is feeling better. No longer having hemoptysis. Daughter has seen bleeding in her mouth around her gums. She has permament lower dentures. Upper dentures are removable. She is receiving dialysis MWF. Her dry weight is 68 kg. She will get muscle cramps and headache when they take off too much fluid. She has increased shortness of breath prior to dialysis. She had dialysis this morning from 7am-10:40am. No shortness of breath this afternoon.  ? ?Imaging:  ?CTA 09/21/21>>  Lungs/Pleura: Interstitial pulmonary edema. Multiple few mm right upper lobe ground-glass subpleural nodules. 3 mm subpleural pulmonary nodule, image 49/134. Areas of probable rounded atelectasis in the right middle lobe, lingula and the dependent portions of the bilateral lower lobes. 8 mm soft tissue mass with coarse central calcification in the right lower lobe, image 58/134. bilateral small pleural effusions. ? ? ?No Known Allergies ? ?Immunization History  ?Administered Date(s) Administered  ? Fluad Quad(high Dose 66+)  06/18/2020  ? Influenza,inj,Quad PF,6+ Mos 04/10/2019  ? Moderna Sars-Covid-2 Vaccination 10/11/2019, 11/08/2019  ? Pension scheme manager 66yrs & up 09/30/2021  ? Pneumococcal Polysaccharide-23 04/10/2019  ? ? ?Past Medical History:  ?Diagnosis Date  ? Anemia   ? Anxiety   ? Arthritis   ? Diabetes mellitus without complication ([place])   ? type 2  ? Heart murmur   ? echo 07/02/20: Mild MR/TR, mild-moderate AV sclerosis, bicuspid or functional bicuspid AV, no evidence of AS. Murmr felt due to AV.  ? History of blood transfusion   ? Hyperlipidemia   ? Hypertension   ? Pneumonia   ? PONV (postoperative nausea and vomiting)   ? Renal disorder   ? M-W-F  ? ? ?Tobacco History: ?Social History  ? ?Tobacco Use  ?Smoking Status Never  ?Smokeless Tobacco Never  ? ?Counseling given: Not Answered ? ? ?Outpatient Medications Prior to Visit  ?Medication Sig Dispense Refill  ? acetaminophen (TYLENOL) 325 MG tablet Take 2 tablets (650 mg total) by mouth every 6 (six) hours as needed for mild pain or moderate pain. 30 tablet 0  ? amLODipine (NORVASC) 10 MG tablet Take 10 mg by mouth daily.    ? atorvastatin (LIPITOR) 20 MG tablet Take 1 tablet (20 mg total) by mouth daily. (Patient not taking: Reported on 10/14/2021) 30 tablet 0  ? calcium acetate (PHOSLO) 667 MG capsule Take 667 mg by mouth 3 (three) times daily.    ? diclofenac Sodium (VOLTAREN) 1 % GEL Apply 4 g topically 4 (four) times daily. (Patient not taking: Reported on 10/14/2021) 50 g 3  ? gabapentin (NEURONTIN) 100 MG capsule Take 100 mg by mouth at bedtime.    ?  hydrALAZINE (APRESOLINE) 25 MG tablet Take 1 tablet (25 mg total) by mouth 3 (three) times daily. (Patient not taking: Reported on 10/14/2021) 90 tablet 0  ? labetalol (NORMODYNE) 200 MG tablet Take 2 tablets (400 mg total) by mouth 2 (two) times daily. 120 tablet 0  ? linagliptin (TRADJENTA) 5 MG TABS tablet Take 5 mg by mouth daily.    ? olopatadine (PATANOL) 0.1 % ophthalmic solution Place 1 drop  into both eyes 2 (two) times daily as needed for allergies.    ? ondansetron (ZOFRAN-ODT) 4 MG disintegrating tablet Take 1 tablet (4 mg total) by mouth every 8 (eight) hours as needed for nausea or vomiting. 10 tablet 0  ? pantoprazole (PROTONIX) 40 MG tablet Take 1 tablet (40 mg total) by mouth daily. 60 tablet 0  ? furosemide (LASIX) 80 MG tablet Take 1 tablet (80 mg total) by mouth daily as needed for fluid.    ? sucralfate (CARAFATE) 1 GM/10ML suspension Take 1 g by mouth daily as needed (For ulcers).    ? escitalopram (LEXAPRO) 10 MG tablet Take 1 tablet (10 mg total) by mouth daily. (Patient not taking: Reported on 09/24/2021) 90 tablet 1  ? sitaGLIPtin (JANUVIA) 25 MG tablet Take 1 tablet (25 mg total) by mouth daily. (Patient not taking: Reported on 09/24/2021) 90 tablet 3  ? ?No facility-administered medications prior to visit.  ? ? ?Review of Systems ? ?Review of Systems  ?Constitutional: Negative.   ?HENT:  Positive for ear pain.   ?Respiratory: Negative.  Negative for cough and shortness of breath.   ?Neurological:  Positive for dizziness.  ? ? ?Physical Exam ? ?BP 140/80 (BP Location: Right Arm, Patient Position: Sitting, Cuff Size: Normal)   Pulse 70   Temp 98.5 ?F (36.9 ?C) (Oral)   Ht 5\' 3"  (1.6 m)   Wt 151 lb 8 oz (68.7 kg)   SpO2 97%   BMI 26.84 kg/m?  ?Physical Exam ?Constitutional:   ?   Appearance: Normal appearance.  ?HENT:  ?   Head: Normocephalic and atraumatic.  ?   Mouth/Throat:  ?   Mouth: Mucous membranes are moist.  ?   Pharynx: Oropharynx is clear.  ?   Comments: Normal oral mucosa. No bleeding. Dentures upper and lower.  ?Cardiovascular:  ?   Rate and Rhythm: Normal rate and regular rhythm.  ?Pulmonary:  ?   Effort: Pulmonary effort is normal.  ?   Breath sounds: Normal breath sounds.  ?Musculoskeletal:     ?   General: Normal range of motion.  ?   Cervical back: Normal range of motion and neck supple. No tenderness.  ?Lymphadenopathy:  ?   Cervical: No cervical adenopathy.   ?Skin: ?   General: Skin is warm and dry.  ?Neurological:  ?   General: No focal deficit present.  ?   Mental Status: She is alert and oriented to person, place, and time. Mental status is at baseline.  ?Psychiatric:     ?   Mood and Affect: Mood normal.     ?   Behavior: Behavior normal.     ?   Thought Content: Thought content normal.     ?   Judgment: Judgment normal.  ?  ? ?Lab Results: ? ?CBC ?   ?Component Value Date/Time  ? WBC 9.0 10/14/2021 0006  ? RBC 3.84 (L) 10/14/2021 0006  ? HGB 10.7 (L) 10/14/2021 0006  ? HGB 8.1 (L) 05/15/2020 1640  ? HCT 34.1 (L) 10/14/2021 0006  ?  HCT 26.4 (L) 05/15/2020 1640  ? PLT 127 (L) 10/14/2021 0006  ? PLT 188 05/15/2020 1640  ? MCV 88.8 10/14/2021 0006  ? MCV 92 05/15/2020 1640  ? MCH 27.9 10/14/2021 0006  ? MCHC 31.4 10/14/2021 0006  ? RDW 16.8 (H) 10/14/2021 0006  ? RDW 13.6 05/15/2020 1640  ? LYMPHSABS 0.9 10/14/2021 0006  ? LYMPHSABS 0.9 05/15/2020 1640  ? MONOABS 0.7 10/14/2021 0006  ? EOSABS 0.2 10/14/2021 0006  ? EOSABS 0.1 05/15/2020 1640  ? BASOSABS 0.1 10/14/2021 0006  ? BASOSABS 0.0 05/15/2020 1640  ? ? ?BMET ?   ?Component Value Date/Time  ? NA 138 10/14/2021 0006  ? NA 138 05/15/2020 1640  ? K 4.9 10/14/2021 0006  ? CL 100 10/14/2021 0006  ? CO2 28 10/14/2021 0006  ? GLUCOSE 107 (H) 10/14/2021 0006  ? BUN 25 (H) 10/14/2021 0006  ? BUN 76 (HH) 05/15/2020 1640  ? CREATININE 5.27 (H) 10/14/2021 0006  ? CALCIUM 9.0 10/14/2021 0006  ? GFRNONAA 9 (L) 10/14/2021 0006  ? GFRAA 8 (L) 05/15/2020 1640  ? ? ?BNP ?   ?Component Value Date/Time  ? BNP 456.0 (H) 06/27/2020 1332  ? ? ?ProBNP ?No results found for: PROBNP ? ?Imaging: ?CT ABDOMEN PELVIS WO CONTRAST ? ?Result Date: 10/14/2021 ?CLINICAL DATA:  66 year old female with history of left lower quadrant abdominal pain. Evaluate for potential bowel obstruction. EXAM: CT ABDOMEN AND PELVIS WITHOUT CONTRAST TECHNIQUE: Multidetector CT imaging of the abdomen and pelvis was performed following the standard protocol without  IV contrast. RADIATION DOSE REDUCTION: This exam was performed according to the departmental dose-optimization program which includes automated exposure control, adjustment of the mA and/or kV according to patient size

## 2021-10-13 NOTE — ED Triage Notes (Signed)
Pt reported to ED with c/o lower abdominal and flank pain since earlier today. Pt complains of urinary urgency and oliguria. States when she pees it feels like her vagina is falling out. Denies any nausea or vomiting. States she is constipated and last BM was yesterday.  ?

## 2021-10-14 ENCOUNTER — Ambulatory Visit (HOSPITAL_COMMUNITY): Payer: Medicare Other

## 2021-10-14 ENCOUNTER — Emergency Department (HOSPITAL_COMMUNITY): Payer: Medicare Other

## 2021-10-14 DIAGNOSIS — I700 Atherosclerosis of aorta: Secondary | ICD-10-CM | POA: Diagnosis not present

## 2021-10-14 DIAGNOSIS — R1032 Left lower quadrant pain: Secondary | ICD-10-CM | POA: Diagnosis not present

## 2021-10-14 DIAGNOSIS — N819 Female genital prolapse, unspecified: Secondary | ICD-10-CM | POA: Diagnosis not present

## 2021-10-14 LAB — CBC WITH DIFFERENTIAL/PLATELET
Abs Immature Granulocytes: 0.05 10*3/uL (ref 0.00–0.07)
Basophils Absolute: 0.1 10*3/uL (ref 0.0–0.1)
Basophils Relative: 1 %
Eosinophils Absolute: 0.2 10*3/uL (ref 0.0–0.5)
Eosinophils Relative: 2 %
HCT: 34.1 % — ABNORMAL LOW (ref 36.0–46.0)
Hemoglobin: 10.7 g/dL — ABNORMAL LOW (ref 12.0–15.0)
Immature Granulocytes: 1 %
Lymphocytes Relative: 10 %
Lymphs Abs: 0.9 10*3/uL (ref 0.7–4.0)
MCH: 27.9 pg (ref 26.0–34.0)
MCHC: 31.4 g/dL (ref 30.0–36.0)
MCV: 88.8 fL (ref 80.0–100.0)
Monocytes Absolute: 0.7 10*3/uL (ref 0.1–1.0)
Monocytes Relative: 8 %
Neutro Abs: 7.1 10*3/uL (ref 1.7–7.7)
Neutrophils Relative %: 78 %
Platelets: 127 10*3/uL — ABNORMAL LOW (ref 150–400)
RBC: 3.84 MIL/uL — ABNORMAL LOW (ref 3.87–5.11)
RDW: 16.8 % — ABNORMAL HIGH (ref 11.5–15.5)
WBC: 9 10*3/uL (ref 4.0–10.5)
nRBC: 0 % (ref 0.0–0.2)

## 2021-10-14 LAB — BASIC METABOLIC PANEL
Anion gap: 10 (ref 5–15)
BUN: 25 mg/dL — ABNORMAL HIGH (ref 8–23)
CO2: 28 mmol/L (ref 22–32)
Calcium: 9 mg/dL (ref 8.9–10.3)
Chloride: 100 mmol/L (ref 98–111)
Creatinine, Ser: 5.27 mg/dL — ABNORMAL HIGH (ref 0.44–1.00)
GFR, Estimated: 9 mL/min — ABNORMAL LOW (ref >60)
Glucose, Bld: 107 mg/dL — ABNORMAL HIGH (ref 70–99)
Potassium: 4.9 mmol/L (ref 3.5–5.1)
Sodium: 138 mmol/L (ref 135–145)

## 2021-10-14 MED ORDER — HYDROMORPHONE HCL 1 MG/ML IJ SOLN
0.5000 mg | Freq: Once | INTRAMUSCULAR | Status: AC
  Administered 2021-10-14: 0.5 mg via INTRAVENOUS
  Filled 2021-10-14: qty 1

## 2021-10-14 NOTE — Progress Notes (Signed)
Reviewed and agree with assessment/plan. ? ? ?Chesley Mires, MD ?[place] ?10/14/2021, 2:17 PM ?Pager:  [phone] ? ?

## 2021-10-14 NOTE — Assessment & Plan Note (Signed)
-   Receiving HD MWF  ?

## 2021-10-14 NOTE — ED Provider Notes (Signed)
?[facility] ?Provider Note ? ?CSN: [id number] ?Arrival date & time: 10/13/21 2321 ? ?Chief Complaint(s) ?Urinary Retention ? ?HPI ?Rachael Burke is a 66 y.o. female with extensive past medical history listed below including hypertension, hyperlipidemia, diabetes, ESRD on dialysis MWF who presents to the emergency department with lower abdominal discomfort and several days of inability to void.  She reports that while trying to have a bowel movement or void she feels something coming out of her.  She is able to push it back in.  Pain has gradually worsened since onset and radiating to her lower back.  She has not had a bowel movement in 2 days either.  Still passing gas.  She endorses nausea without emesis.  No fevers or chills.  No other physical complaints. ? ?Patient still makes urine stating that she goes 2-3 times a day and has either a few drops or small stream. ? ?HPI ? ?Past Medical History ?Past Medical History:  ?Diagnosis Date  ? Anemia   ? Anxiety   ? Arthritis   ? Diabetes mellitus without complication ([place])   ? type 2  ? Heart murmur   ? echo 07/02/20: Mild MR/TR, mild-moderate AV sclerosis, bicuspid or functional bicuspid AV, no evidence of AS. Murmr felt due to AV.  ? History of blood transfusion   ? Hyperlipidemia   ? Hypertension   ? Pneumonia   ? PONV (postoperative nausea and vomiting)   ? Renal disorder   ? M-W-F  ? ?Patient Active Problem List  ? Diagnosis Date Noted  ? Pulmonary nodules 10/13/2021  ? Right ear pain 10/13/2021  ? Hematemesis with nausea   ? Nausea and vomiting   ? Hemoptysis 09/21/2021  ? End stage renal disease on dialysis [facility]) 07/23/2020  ? Generalized anxiety disorder 07/23/2020  ? Acute renal failure superimposed on chronic kidney disease ([place]) 12/29/2019  ? Anemia associated with chronic renal failure 12/29/2019  ? Symptomatic anemia 04/08/2019  ? Hyperlipidemia 04/08/2019  ? AKI (acute kidney injury) ([place]) 04/07/2019  ?  Hypertension associated with diabetes ([place]) 04/06/2019  ? Type 2 diabetes mellitus with complication, without long-term current use of insulin ([place]) 01/04/2018  ? ?Home Medication(s) ?Prior to Admission medications   ?Medication Sig Start Date End Date Taking? Authorizing Provider  ?acetaminophen (TYLENOL) 325 MG tablet Take 2 tablets (650 mg total) by mouth every 6 (six) hours as needed for mild pain or moderate pain. 09/05/21  Yes Carlisle Cater, PA-C  ?amLODipine (NORVASC) 10 MG tablet Take 10 mg by mouth daily.   Yes [provider]  ?calcium acetate (PHOSLO) 667 MG capsule Take 667 mg by mouth 3 (three) times daily. 04/02/21  Yes [provider]  ?gabapentin (NEURONTIN) 100 MG capsule Take 100 mg by mouth at bedtime. 09/03/21  Yes [provider]  ?labetalol (NORMODYNE) 200 MG tablet Take 2 tablets (400 mg total) by mouth 2 (two) times daily. 09/23/21  Yes Elgergawy, Silver Huguenin, MD  ?linagliptin (TRADJENTA) 5 MG TABS tablet Take 5 mg by mouth daily.   Yes [provider]  ?olopatadine (PATANOL) 0.1 % ophthalmic solution Place 1 drop into both eyes 2 (two) times daily as needed for allergies.   Yes [provider]  ?ondansetron (ZOFRAN-ODT) 4 MG disintegrating tablet Take 1 tablet (4 mg total) by mouth every 8 (eight) hours as needed for nausea or vomiting. 09/05/21  Yes Carlisle Cater, PA-C  ?pantoprazole (PROTONIX) 40 MG tablet Take 1 tablet (40 mg  total) by mouth daily. 09/23/21  Yes Elgergawy, Silver Huguenin, MD  ?atorvastatin (LIPITOR) 20 MG tablet Take 1 tablet (20 mg total) by mouth daily. ?Patient not taking: Reported on 10/14/2021 01/01/20   Charlynne Cousins, MD  ?diclofenac Sodium (VOLTAREN) 1 % GEL Apply 4 g topically 4 (four) times daily. ?Patient not taking: Reported on 10/14/2021 06/18/20   Just, Laurita Quint, FNP  ?escitalopram (LEXAPRO) 10 MG tablet Take 1 tablet (10 mg total) by mouth daily. ?Patient not taking: Reported on 09/24/2021 07/23/20 10/21/20  Horald Pollen,  MD  ?hydrALAZINE (APRESOLINE) 25 MG tablet Take 1 tablet (25 mg total) by mouth 3 (three) times daily. ?Patient not taking: Reported on 10/14/2021 09/23/21   Elgergawy, Silver Huguenin, MD  ?losartan (COZAAR) 100 MG tablet Take 100 mg by mouth daily. 10/08/21   [provider]  ?sitaGLIPtin (JANUVIA) 25 MG tablet Take 1 tablet (25 mg total) by mouth daily. ?Patient not taking: Reported on 09/24/2021 07/23/20 10/21/20  Horald Pollen, MD  ?                                                                                                                                  ?Allergies ?Patient has no known allergies. ? ?Review of Systems ?Review of Systems ?As noted in HPI ? ?Physical Exam ?Vital Signs  ?I have reviewed the triage vital signs ?BP (!) 195/71   Pulse 65   Temp 98 ?F (36.7 ?C) (Oral)   Resp 19   SpO2 99%  ? ?Physical Exam ?Vitals reviewed. Exam conducted with a chaperone present.  ?Constitutional:   ?   General: She is not in acute distress. ?   Appearance: She is well-developed. She is not diaphoretic.  ?HENT:  ?   Head: Normocephalic and atraumatic.  ?   Right Ear: External ear normal.  ?   Left Ear: External ear normal.  ?   Nose: Nose normal.  ?Eyes:  ?   General: No scleral icterus. ?   Conjunctiva/sclera: Conjunctivae normal.  ?Neck:  ?   Trachea: Phonation normal.  ?Cardiovascular:  ?   Rate and Rhythm: Normal rate and regular rhythm.  ?Pulmonary:  ?   Effort: Pulmonary effort is normal. No respiratory distress.  ?   Breath sounds: No stridor.  ?Abdominal:  ?   General: There is no distension.  ?   Tenderness: There is abdominal tenderness in the suprapubic area. There is no guarding or rebound.  ?Genitourinary: ?   Vagina: Prolapsed vaginal walls present.  ?Musculoskeletal:     ?   General: Normal range of motion.  ?   Cervical back: Normal range of motion.  ?Neurological:  ?   Mental Status: She is alert and oriented to person, place, and time.  ?Psychiatric:     ?   Behavior: Behavior normal.   ? ? ?ED Results and Treatments ?Labs ?(all labs ordered are listed, but only  abnormal results are displayed) ?Labs Reviewed  ?CBC WITH DIFFERENTIAL/PLATELET - Abnormal; Notable for the following components:  ?    Result Value  ? RBC 3.84 (*)   ? Hemoglobin 10.7 (*)   ? HCT 34.1 (*)   ? RDW 16.8 (*)   ? Platelets 127 (*)   ? All other components within normal limits  ?BASIC METABOLIC PANEL - Abnormal; Notable for the following components:  ? Glucose, Bld 107 (*)   ? BUN 25 (*)   ? Creatinine, Ser 5.27 (*)   ? GFR, Estimated 9 (*)   ? All other components within normal limits  ?URINE CULTURE  ?URINALYSIS, ROUTINE W REFLEX MICROSCOPIC  ?                                                                                                                       ?EKG ? EKG Interpretation ? ?Date/Time:    ?Ventricular Rate:    ?PR Interval:    ?QRS Duration:   ?QT Interval:    ?QTC Calculation:   ?R Axis:     ?Text Interpretation:   ?  ? ?  ? ?Radiology ?CT ABDOMEN PELVIS WO CONTRAST ? ?Result Date: 10/14/2021 ?CLINICAL DATA:  66 year old female with history of left lower quadrant abdominal pain. Evaluate for potential bowel obstruction. EXAM: CT ABDOMEN AND PELVIS WITHOUT CONTRAST TECHNIQUE: Multidetector CT imaging of the abdomen and pelvis was performed following the standard protocol without IV contrast. RADIATION DOSE REDUCTION: This exam was performed according to the departmental dose-optimization program which includes automated exposure control, adjustment of the mA and/or kV according to patient size and/or use of iterative reconstruction technique. COMPARISON:  CT the abdomen and pelvis 09/05/2021. FINDINGS: Lower chest: Atherosclerotic calcifications in the descending thoracic aorta. Trace left pleural effusion lying dependently. Hepatobiliary: No suspicious cystic or solid hepatic lesions are confidently identified on today's noncontrast CT examination. Status post cholecystectomy. Pancreas: No definite pancreatic  mass or peripancreatic fluid collections or inflammatory changes are noted on today's noncontrast CT examination. Spleen: Unremarkable. Adrenals/Urinary Tract: There are no abnormal calcifications within the c

## 2021-10-14 NOTE — ED Notes (Signed)
EDP notified that the bladder scanner was unable to detect the bladder  ?

## 2021-10-15 DIAGNOSIS — D631 Anemia in chronic kidney disease: Secondary | ICD-10-CM | POA: Diagnosis not present

## 2021-10-15 DIAGNOSIS — R52 Pain, unspecified: Secondary | ICD-10-CM | POA: Diagnosis not present

## 2021-10-15 DIAGNOSIS — Z992 Dependence on renal dialysis: Secondary | ICD-10-CM | POA: Diagnosis not present

## 2021-10-15 DIAGNOSIS — N2581 Secondary hyperparathyroidism of renal origin: Secondary | ICD-10-CM | POA: Diagnosis not present

## 2021-10-15 DIAGNOSIS — D509 Iron deficiency anemia, unspecified: Secondary | ICD-10-CM | POA: Diagnosis not present

## 2021-10-15 DIAGNOSIS — N186 End stage renal disease: Secondary | ICD-10-CM | POA: Diagnosis not present

## 2021-10-17 DIAGNOSIS — N816 Rectocele: Secondary | ICD-10-CM | POA: Diagnosis not present

## 2021-10-17 DIAGNOSIS — R300 Dysuria: Secondary | ICD-10-CM | POA: Diagnosis not present

## 2021-10-17 DIAGNOSIS — Z992 Dependence on renal dialysis: Secondary | ICD-10-CM | POA: Diagnosis not present

## 2021-10-17 DIAGNOSIS — D631 Anemia in chronic kidney disease: Secondary | ICD-10-CM | POA: Diagnosis not present

## 2021-10-17 DIAGNOSIS — R52 Pain, unspecified: Secondary | ICD-10-CM | POA: Diagnosis not present

## 2021-10-17 DIAGNOSIS — N186 End stage renal disease: Secondary | ICD-10-CM | POA: Diagnosis not present

## 2021-10-17 DIAGNOSIS — N8111 Cystocele, midline: Secondary | ICD-10-CM | POA: Diagnosis not present

## 2021-10-17 DIAGNOSIS — D509 Iron deficiency anemia, unspecified: Secondary | ICD-10-CM | POA: Diagnosis not present

## 2021-10-17 DIAGNOSIS — N2581 Secondary hyperparathyroidism of renal origin: Secondary | ICD-10-CM | POA: Diagnosis not present

## 2021-10-17 DIAGNOSIS — Z5941 Food insecurity: Secondary | ICD-10-CM | POA: Diagnosis not present

## 2021-10-20 DIAGNOSIS — D631 Anemia in chronic kidney disease: Secondary | ICD-10-CM | POA: Diagnosis not present

## 2021-10-20 DIAGNOSIS — Z992 Dependence on renal dialysis: Secondary | ICD-10-CM | POA: Diagnosis not present

## 2021-10-20 DIAGNOSIS — N2581 Secondary hyperparathyroidism of renal origin: Secondary | ICD-10-CM | POA: Diagnosis not present

## 2021-10-20 DIAGNOSIS — N186 End stage renal disease: Secondary | ICD-10-CM | POA: Diagnosis not present

## 2021-10-20 DIAGNOSIS — R52 Pain, unspecified: Secondary | ICD-10-CM | POA: Diagnosis not present

## 2021-10-20 DIAGNOSIS — D509 Iron deficiency anemia, unspecified: Secondary | ICD-10-CM | POA: Diagnosis not present

## 2021-10-22 DIAGNOSIS — N2581 Secondary hyperparathyroidism of renal origin: Secondary | ICD-10-CM | POA: Diagnosis not present

## 2021-10-22 DIAGNOSIS — R52 Pain, unspecified: Secondary | ICD-10-CM | POA: Diagnosis not present

## 2021-10-22 DIAGNOSIS — D631 Anemia in chronic kidney disease: Secondary | ICD-10-CM | POA: Diagnosis not present

## 2021-10-22 DIAGNOSIS — D509 Iron deficiency anemia, unspecified: Secondary | ICD-10-CM | POA: Diagnosis not present

## 2021-10-22 DIAGNOSIS — Z992 Dependence on renal dialysis: Secondary | ICD-10-CM | POA: Diagnosis not present

## 2021-10-22 DIAGNOSIS — N186 End stage renal disease: Secondary | ICD-10-CM | POA: Diagnosis not present

## 2021-10-23 DIAGNOSIS — E782 Mixed hyperlipidemia: Secondary | ICD-10-CM | POA: Diagnosis not present

## 2021-10-23 DIAGNOSIS — K219 Gastro-esophageal reflux disease without esophagitis: Secondary | ICD-10-CM | POA: Diagnosis not present

## 2021-10-23 DIAGNOSIS — R42 Dizziness and giddiness: Secondary | ICD-10-CM | POA: Diagnosis not present

## 2021-10-23 DIAGNOSIS — E1122 Type 2 diabetes mellitus with diabetic chronic kidney disease: Secondary | ICD-10-CM | POA: Diagnosis not present

## 2021-10-23 DIAGNOSIS — K5900 Constipation, unspecified: Secondary | ICD-10-CM | POA: Diagnosis not present

## 2021-10-23 DIAGNOSIS — R269 Unspecified abnormalities of gait and mobility: Secondary | ICD-10-CM | POA: Diagnosis not present

## 2021-10-23 DIAGNOSIS — E113493 Type 2 diabetes mellitus with severe nonproliferative diabetic retinopathy without macular edema, bilateral: Secondary | ICD-10-CM | POA: Diagnosis not present

## 2021-10-23 DIAGNOSIS — Z01818 Encounter for other preprocedural examination: Secondary | ICD-10-CM | POA: Diagnosis not present

## 2021-10-23 DIAGNOSIS — K5909 Other constipation: Secondary | ICD-10-CM | POA: Diagnosis not present

## 2021-10-23 DIAGNOSIS — N186 End stage renal disease: Secondary | ICD-10-CM | POA: Diagnosis not present

## 2021-10-23 DIAGNOSIS — I10 Essential (primary) hypertension: Secondary | ICD-10-CM | POA: Diagnosis not present

## 2021-10-23 DIAGNOSIS — H25811 Combined forms of age-related cataract, right eye: Secondary | ICD-10-CM | POA: Diagnosis not present

## 2021-10-23 DIAGNOSIS — H4312 Vitreous hemorrhage, left eye: Secondary | ICD-10-CM | POA: Diagnosis not present

## 2021-10-23 DIAGNOSIS — Z992 Dependence on renal dialysis: Secondary | ICD-10-CM | POA: Diagnosis not present

## 2021-10-24 DIAGNOSIS — N2581 Secondary hyperparathyroidism of renal origin: Secondary | ICD-10-CM | POA: Diagnosis not present

## 2021-10-24 DIAGNOSIS — D631 Anemia in chronic kidney disease: Secondary | ICD-10-CM | POA: Diagnosis not present

## 2021-10-24 DIAGNOSIS — Z992 Dependence on renal dialysis: Secondary | ICD-10-CM | POA: Diagnosis not present

## 2021-10-24 DIAGNOSIS — N816 Rectocele: Secondary | ICD-10-CM | POA: Diagnosis not present

## 2021-10-24 DIAGNOSIS — N186 End stage renal disease: Secondary | ICD-10-CM | POA: Diagnosis not present

## 2021-10-24 DIAGNOSIS — R52 Pain, unspecified: Secondary | ICD-10-CM | POA: Diagnosis not present

## 2021-10-24 DIAGNOSIS — D509 Iron deficiency anemia, unspecified: Secondary | ICD-10-CM | POA: Diagnosis not present

## 2021-10-24 DIAGNOSIS — R300 Dysuria: Secondary | ICD-10-CM | POA: Diagnosis not present

## 2021-10-24 DIAGNOSIS — N8111 Cystocele, midline: Secondary | ICD-10-CM | POA: Diagnosis not present

## 2021-10-27 DIAGNOSIS — N186 End stage renal disease: Secondary | ICD-10-CM | POA: Diagnosis not present

## 2021-10-27 DIAGNOSIS — D509 Iron deficiency anemia, unspecified: Secondary | ICD-10-CM | POA: Diagnosis not present

## 2021-10-27 DIAGNOSIS — D631 Anemia in chronic kidney disease: Secondary | ICD-10-CM | POA: Diagnosis not present

## 2021-10-27 DIAGNOSIS — Z992 Dependence on renal dialysis: Secondary | ICD-10-CM | POA: Diagnosis not present

## 2021-10-27 DIAGNOSIS — N2581 Secondary hyperparathyroidism of renal origin: Secondary | ICD-10-CM | POA: Diagnosis not present

## 2021-10-27 DIAGNOSIS — R52 Pain, unspecified: Secondary | ICD-10-CM | POA: Diagnosis not present

## 2021-10-29 DIAGNOSIS — E1129 Type 2 diabetes mellitus with other diabetic kidney complication: Secondary | ICD-10-CM | POA: Diagnosis not present

## 2021-10-29 DIAGNOSIS — H43392 Other vitreous opacities, left eye: Secondary | ICD-10-CM | POA: Diagnosis not present

## 2021-10-29 DIAGNOSIS — R52 Pain, unspecified: Secondary | ICD-10-CM | POA: Diagnosis not present

## 2021-10-29 DIAGNOSIS — E113593 Type 2 diabetes mellitus with proliferative diabetic retinopathy without macular edema, bilateral: Secondary | ICD-10-CM | POA: Diagnosis not present

## 2021-10-29 DIAGNOSIS — H4323 Crystalline deposits in vitreous body, bilateral: Secondary | ICD-10-CM | POA: Diagnosis not present

## 2021-10-29 DIAGNOSIS — N186 End stage renal disease: Secondary | ICD-10-CM | POA: Diagnosis not present

## 2021-10-29 DIAGNOSIS — Z992 Dependence on renal dialysis: Secondary | ICD-10-CM | POA: Diagnosis not present

## 2021-10-29 DIAGNOSIS — N2581 Secondary hyperparathyroidism of renal origin: Secondary | ICD-10-CM | POA: Diagnosis not present

## 2021-10-29 DIAGNOSIS — D509 Iron deficiency anemia, unspecified: Secondary | ICD-10-CM | POA: Diagnosis not present

## 2021-10-29 DIAGNOSIS — D631 Anemia in chronic kidney disease: Secondary | ICD-10-CM | POA: Diagnosis not present

## 2021-10-29 DIAGNOSIS — H25813 Combined forms of age-related cataract, bilateral: Secondary | ICD-10-CM | POA: Diagnosis not present

## 2021-10-31 DIAGNOSIS — N186 End stage renal disease: Secondary | ICD-10-CM | POA: Diagnosis not present

## 2021-10-31 DIAGNOSIS — Z992 Dependence on renal dialysis: Secondary | ICD-10-CM | POA: Diagnosis not present

## 2021-10-31 DIAGNOSIS — R52 Pain, unspecified: Secondary | ICD-10-CM | POA: Diagnosis not present

## 2021-10-31 DIAGNOSIS — D509 Iron deficiency anemia, unspecified: Secondary | ICD-10-CM | POA: Diagnosis not present

## 2021-10-31 DIAGNOSIS — E1122 Type 2 diabetes mellitus with diabetic chronic kidney disease: Secondary | ICD-10-CM | POA: Diagnosis not present

## 2021-10-31 DIAGNOSIS — D631 Anemia in chronic kidney disease: Secondary | ICD-10-CM | POA: Diagnosis not present

## 2021-10-31 DIAGNOSIS — N2581 Secondary hyperparathyroidism of renal origin: Secondary | ICD-10-CM | POA: Diagnosis not present

## 2021-11-02 ENCOUNTER — Other Ambulatory Visit: Payer: Self-pay

## 2021-11-02 ENCOUNTER — Emergency Department (HOSPITAL_COMMUNITY): Payer: Medicare Other

## 2021-11-02 ENCOUNTER — Encounter (HOSPITAL_COMMUNITY): Payer: Self-pay | Admitting: Emergency Medicine

## 2021-11-02 ENCOUNTER — Emergency Department (HOSPITAL_COMMUNITY)
Admission: EM | Admit: 2021-11-02 | Discharge: 2021-11-03 | Disposition: A | Discharge status: Home / Self Care | Payer: Medicare Other | Attending: Emergency Medicine | Admitting: Emergency Medicine

## 2021-11-02 DIAGNOSIS — Z79899 Other long term (current) drug therapy: Secondary | ICD-10-CM | POA: Diagnosis not present

## 2021-11-02 DIAGNOSIS — Z20822 Contact with and (suspected) exposure to covid-19: Secondary | ICD-10-CM | POA: Insufficient documentation

## 2021-11-02 DIAGNOSIS — R7989 Other specified abnormal findings of blood chemistry: Secondary | ICD-10-CM | POA: Diagnosis not present

## 2021-11-02 DIAGNOSIS — N186 End stage renal disease: Secondary | ICD-10-CM | POA: Diagnosis not present

## 2021-11-02 DIAGNOSIS — R519 Headache, unspecified: Secondary | ICD-10-CM | POA: Insufficient documentation

## 2021-11-02 DIAGNOSIS — R0689 Other abnormalities of breathing: Secondary | ICD-10-CM | POA: Diagnosis not present

## 2021-11-02 DIAGNOSIS — R6889 Other general symptoms and signs: Secondary | ICD-10-CM | POA: Diagnosis not present

## 2021-11-02 DIAGNOSIS — Z743 Need for continuous supervision: Secondary | ICD-10-CM | POA: Diagnosis not present

## 2021-11-02 DIAGNOSIS — J810 Acute pulmonary edema: Secondary | ICD-10-CM | POA: Diagnosis not present

## 2021-11-02 DIAGNOSIS — J811 Chronic pulmonary edema: Secondary | ICD-10-CM | POA: Diagnosis not present

## 2021-11-02 DIAGNOSIS — R509 Fever, unspecified: Secondary | ICD-10-CM | POA: Diagnosis not present

## 2021-11-02 DIAGNOSIS — R0602 Shortness of breath: Secondary | ICD-10-CM | POA: Diagnosis not present

## 2021-11-02 LAB — HEPATIC FUNCTION PANEL
ALT: 12 U/L (ref 0–44)
AST: 25 U/L (ref 15–41)
Albumin: 4 g/dL (ref 3.5–5.0)
Alkaline Phosphatase: 82 U/L (ref 38–126)
Bilirubin, Direct: 0.3 mg/dL — ABNORMAL HIGH (ref 0.0–0.2)
Indirect Bilirubin: 0.4 mg/dL (ref 0.3–0.9)
Total Bilirubin: 0.7 mg/dL (ref 0.3–1.2)
Total Protein: 7.7 g/dL (ref 6.5–8.1)

## 2021-11-02 LAB — BASIC METABOLIC PANEL WITH GFR
Anion gap: 12 (ref 5–15)
BUN: 33 mg/dL — ABNORMAL HIGH (ref 8–23)
CO2: 29 mmol/L (ref 22–32)
Calcium: 9.6 mg/dL (ref 8.9–10.3)
Chloride: 97 mmol/L — ABNORMAL LOW (ref 98–111)
Creatinine, Ser: 7.67 mg/dL — ABNORMAL HIGH (ref 0.44–1.00)
GFR, Estimated: 5 mL/min — ABNORMAL LOW
Glucose, Bld: 88 mg/dL (ref 70–99)
Potassium: 5.9 mmol/L — ABNORMAL HIGH (ref 3.5–5.1)
Sodium: 138 mmol/L (ref 135–145)

## 2021-11-02 LAB — CBC WITH DIFFERENTIAL/PLATELET
Abs Immature Granulocytes: 0.12 10*3/uL — ABNORMAL HIGH (ref 0.00–0.07)
Basophils Absolute: 0.1 10*3/uL (ref 0.0–0.1)
Basophils Relative: 1 %
Eosinophils Absolute: 0.2 10*3/uL (ref 0.0–0.5)
Eosinophils Relative: 2 %
HCT: 37.9 % (ref 36.0–46.0)
Hemoglobin: 11.8 g/dL — ABNORMAL LOW (ref 12.0–15.0)
Immature Granulocytes: 1 %
Lymphocytes Relative: 11 %
Lymphs Abs: 1.1 10*3/uL (ref 0.7–4.0)
MCH: 27.8 pg (ref 26.0–34.0)
MCHC: 31.1 g/dL (ref 30.0–36.0)
MCV: 89.2 fL (ref 80.0–100.0)
Monocytes Absolute: 0.8 10*3/uL (ref 0.1–1.0)
Monocytes Relative: 8 %
Neutro Abs: 7.9 10*3/uL — ABNORMAL HIGH (ref 1.7–7.7)
Neutrophils Relative %: 77 %
Platelets: 143 10*3/uL — ABNORMAL LOW (ref 150–400)
RBC: 4.25 MIL/uL (ref 3.87–5.11)
RDW: 18 % — ABNORMAL HIGH (ref 11.5–15.5)
WBC: 10.1 10*3/uL (ref 4.0–10.5)
nRBC: 0.3 % — ABNORMAL HIGH (ref 0.0–0.2)

## 2021-11-02 LAB — HEPATITIS B SURFACE ANTIGEN: Hepatitis B Surface Ag: NONREACTIVE

## 2021-11-02 LAB — HEPATITIS B CORE ANTIBODY, TOTAL: Hep B Core Total Ab: NONREACTIVE

## 2021-11-02 LAB — HEPATITIS B SURFACE ANTIBODY,QUALITATIVE: Hep B S Ab: REACTIVE — AB

## 2021-11-02 LAB — RESP PANEL BY RT-PCR (FLU A&B, COVID) ARPGX2
Influenza A by PCR: NEGATIVE
Influenza B by PCR: NEGATIVE
SARS Coronavirus 2 by RT PCR: NEGATIVE

## 2021-11-02 LAB — BRAIN NATRIURETIC PEPTIDE: B Natriuretic Peptide: 1598.9 pg/mL — ABNORMAL HIGH (ref 0.0–100.0)

## 2021-11-02 LAB — HEPATITIS C ANTIBODY: HCV Ab: NONREACTIVE

## 2021-11-02 LAB — TROPONIN I (HIGH SENSITIVITY): Troponin I (High Sensitivity): 11 ng/L (ref <18)

## 2021-11-02 LAB — LIPASE, BLOOD: Lipase: 32 U/L (ref 11–51)

## 2021-11-02 LAB — LACTIC ACID, PLASMA: Lactic Acid, Venous: 0.7 mmol/L (ref 0.5–1.9)

## 2021-11-02 MED ORDER — ALTEPLASE 2 MG IJ SOLR: 2.0000 mg | Freq: Once | INTRAMUSCULAR | Status: DC | PRN

## 2021-11-02 MED ORDER — CHLORHEXIDINE GLUCONATE CLOTH 2 % EX PADS: 6.0000 | MEDICATED_PAD | Freq: Every day | CUTANEOUS | Status: DC

## 2021-11-02 MED ORDER — PENTAFLUOROPROP-TETRAFLUOROETH EX AERO: 1.0000 "application " | INHALATION_SPRAY | CUTANEOUS | Status: DC | PRN

## 2021-11-02 MED ORDER — NITROGLYCERIN 0.4 MG SL SUBL
0.4000 mg | SUBLINGUAL_TABLET | SUBLINGUAL | Status: DC | PRN
  Administered 2021-11-02 (×2): 0.4 mg via SUBLINGUAL
  Filled 2021-11-02 (×2): qty 1

## 2021-11-02 MED ORDER — LIDOCAINE HCL (PF) 1 % IJ SOLN
5.0000 mL | INTRAMUSCULAR | Status: DC | PRN
Start: 2021-11-02 — End: 2021-11-03

## 2021-11-02 MED ORDER — HEPARIN SODIUM (PORCINE) 1000 UNIT/ML DIALYSIS: 1000.0000 [IU] | INTRAMUSCULAR | Status: DC | PRN

## 2021-11-02 MED ORDER — ENALAPRILAT 1.25 MG/ML IV SOLN
1.2500 mg | Freq: Once | INTRAVENOUS | Status: AC
  Administered 2021-11-02: 1.25 mg via INTRAVENOUS
  Filled 2021-11-02: qty 1

## 2021-11-02 MED ORDER — LIDOCAINE-PRILOCAINE 2.5-2.5 % EX CREA: 1.0000 "application " | TOPICAL_CREAM | CUTANEOUS | Status: DC | PRN

## 2021-11-02 MED ORDER — ANTICOAGULANT SODIUM CITRATE 4% (200MG/5ML) IV SOLN: 5.0000 mL | Status: DC | PRN

## 2021-11-02 NOTE — ED Notes (Signed)
Pt called out c/o chest tightness and feeling as if she can't breathe. Tyrone Nine, DO made aware, at bedside to assess pt. Medication orders placed at this time.

## 2021-11-02 NOTE — ED Triage Notes (Signed)
Pt BIB [facility] from home c/o SOB. Pt is a MWF HD pt, no missed treatments. Pt endorses that she usually feels SOB on Sundays but today is much worse. Pt endorses nausea at baseline. EMS reports pt feels slightly warm to touch. EMS VS- 190/92, HR 72, SpO2 91% on RA, placed on 4L via [place]. Pt tachypneic. Pt endorses CP to L chest, states she thinks it is worse due to SOB.

## 2021-11-02 NOTE — ED Notes (Signed)
Report given to HD RN. Per HD RN, night shift HD RN not here at this time, will update when ready.

## 2021-11-02 NOTE — ED Notes (Signed)
Pt removed from BiPAP per nephrology MD, pt denies chest tightness or difficulty breathing at this time. Pt respirations even, unlabored. Per nephrology MD pt will receive HD treatment at around 1600 today.

## 2021-11-02 NOTE — Procedures (Signed)
   I was present at this dialysis session, have reviewed the session itself and made  appropriate changes Kelly Splinter MD [place] pager [phone]   11/02/2021, 10:32 PM

## 2021-11-02 NOTE — Progress Notes (Signed)
MD notified see progress note

## 2021-11-02 NOTE — ED Notes (Signed)
Pt SpO2 87% on RA. Pt placed on 2L via [place]. SpO2 99% on 2L at this time.

## 2021-11-02 NOTE — Progress Notes (Addendum)
Asked to see for dialysis.  MWF HD patient, last HD Friday. Here w/ SOB acute onset this morning about 8 am. No fever, chills or prod cough. Family provides interpreting. Pt on HD about 2 yrs, has LUA AVF. Has had this problem before. Compliance is good, stays on the machine. In ED pt working hard to breath so was put on bipap. She feels much better, RR's down to 16-18 range. CXR showed bilat pulm edema moderate severity.  No gross edema on exam. Plan HD upstairs a bit later if she can come off bipap okay. She is not being admitted at this point - will return to ED after HD for reassessment prior to dc'ing home.     MWF SW 3.5h  400/500   68.5kg   2/2 bath  P2   Heparin none  LUA AVF - last HD 6/02, coming off 0.5- 1.5 kg over - mircera 200ug q2, last 5/31, due 6/14  - venofer 100 iv tiw thru 6/14 - hectorol 7 ug IV tiw   Kelly Splinter, MD 11/02/2021, 2:37 PM

## 2021-11-02 NOTE — ED Provider Notes (Signed)
[facility] EMERGENCY DEPARTMENT Provider Note   CSN: [id number] Arrival date & time: 11/02/21  1118     History  Chief Complaint  Patient presents with   Shortness of Breath    Rachael Burke is a 66 y.o. female.  66 yo F with a chief complaint of difficulty breathing.  Going on for the past couple days.  She gets dialysis Monday Wednesday and Friday and typically feels a little short of breath on Sunday but today felt much worse.  She tells me that she just feels bad all over.  She is a little bit uncomfortable in her chest but has trouble describing it.  She felt like she was constipated over the past couple days but denies abdominal pain.  Has been feeling hot and cold and feels like she likely has a fever.  Has had some congestion but no coughing.  No known sick contacts.  The history is limited by a language barrier. A language interpreter was used.  Shortness of Breath     Home Medications Prior to Admission medications   Medication Sig Start Date End Date Taking? Authorizing Provider  acetaminophen (TYLENOL) 325 MG tablet Take 2 tablets (650 mg total) by mouth every 6 (six) hours as needed for mild pain or moderate pain. 09/05/21   Carlisle Cater, PA-C  amLODipine (NORVASC) 10 MG tablet Take 10 mg by mouth daily.    [provider]  atorvastatin (LIPITOR) 20 MG tablet Take 1 tablet (20 mg total) by mouth daily. Patient not taking: Reported on 10/14/2021 01/01/20   Charlynne Cousins, MD  calcium acetate (PHOSLO) 667 MG capsule Take 667 mg by mouth 3 (three) times daily. 04/02/21   [provider]  diclofenac Sodium (VOLTAREN) 1 % GEL Apply 4 g topically 4 (four) times daily. Patient not taking: Reported on 10/14/2021 06/18/20   Just, Laurita Quint, FNP  escitalopram (LEXAPRO) 10 MG tablet Take 1 tablet (10 mg total) by mouth daily. Patient not taking: Reported on 09/24/2021 07/23/20 10/21/20  Horald Pollen, MD  gabapentin (NEURONTIN)  100 MG capsule Take 100 mg by mouth at bedtime. 09/03/21   [provider]  hydrALAZINE (APRESOLINE) 25 MG tablet Take 1 tablet (25 mg total) by mouth 3 (three) times daily. Patient not taking: Reported on 10/14/2021 09/23/21   Elgergawy, Silver Huguenin, MD  labetalol (NORMODYNE) 200 MG tablet Take 2 tablets (400 mg total) by mouth 2 (two) times daily. 09/23/21   Elgergawy, Silver Huguenin, MD  linagliptin (TRADJENTA) 5 MG TABS tablet Take 5 mg by mouth daily.    [provider]  losartan (COZAAR) 100 MG tablet Take 100 mg by mouth daily. 10/08/21   [provider]  olopatadine (PATANOL) 0.1 % ophthalmic solution Place 1 drop into both eyes 2 (two) times daily as needed for allergies.    [provider]  ondansetron (ZOFRAN-ODT) 4 MG disintegrating tablet Take 1 tablet (4 mg total) by mouth every 8 (eight) hours as needed for nausea or vomiting. 09/05/21   Carlisle Cater, PA-C  pantoprazole (PROTONIX) 40 MG tablet Take 1 tablet (40 mg total) by mouth daily. 09/23/21   Elgergawy, Silver Huguenin, MD  sitaGLIPtin (JANUVIA) 25 MG tablet Take 1 tablet (25 mg total) by mouth daily. Patient not taking: Reported on 09/24/2021 07/23/20 10/21/20  Horald Pollen, MD      Allergies    Patient has no known allergies.    Review of Systems   Review of  Systems  Respiratory:  Positive for shortness of breath.    Physical Exam Updated Vital Signs BP (!) 194/135   Pulse 70   Temp 97.7 F (36.5 C) (Oral)   Resp (!) 21   SpO2 (!) 87%  Physical Exam Vitals and nursing note reviewed.  Constitutional:      General: She is not in acute distress.    Appearance: She is well-developed. She is not diaphoretic.  HENT:     Head: Normocephalic and atraumatic.  Eyes:     Pupils: Pupils are equal, round, and reactive to light.  Neck:     Vascular: JVD (to the angle of the jaw) present.  Cardiovascular:     Rate and Rhythm: Normal rate and regular rhythm.     Heart sounds: No murmur heard.   No  friction rub. No gallop.  Pulmonary:     Effort: Pulmonary effort is normal.     Breath sounds: Decreased breath sounds present. No wheezing or rales.     Comments: Diminished breath sounds in all fields without obvious rhonchi or wheezes Abdominal:     General: There is no distension.     Palpations: Abdomen is soft.     Tenderness: There is no abdominal tenderness.  Musculoskeletal:        General: No tenderness.     Cervical back: Normal range of motion and neck supple.  Skin:    General: Skin is warm and dry.  Neurological:     Mental Status: She is alert and oriented to person, place, and time.  Psychiatric:        Behavior: Behavior normal.    ED Results / Procedures / Treatments   Labs (all labs ordered are listed, but only abnormal results are displayed) Labs Reviewed  CBC WITH DIFFERENTIAL/PLATELET - Abnormal; Notable for the following components:      Result Value   Hemoglobin 11.8 (*)    RDW 18.0 (*)    Platelets 143 (*)    nRBC 0.3 (*)    Neutro Abs 7.9 (*)    Abs Immature Granulocytes 0.12 (*)    All other components within normal limits  BRAIN NATRIURETIC PEPTIDE - Abnormal; Notable for the following components:   B Natriuretic Peptide 1,598.9 (*)    All other components within normal limits  BASIC METABOLIC PANEL - Abnormal; Notable for the following components:   Potassium 5.9 (*)    Chloride 97 (*)    BUN 33 (*)    Creatinine, Ser 7.67 (*)    GFR, Estimated 5 (*)    All other components within normal limits  HEPATIC FUNCTION PANEL - Abnormal; Notable for the following components:   Bilirubin, Direct 0.3 (*)    All other components within normal limits  RESP PANEL BY RT-PCR (FLU A&B, COVID) ARPGX2  CULTURE, BLOOD (ROUTINE X 2)  CULTURE, BLOOD (ROUTINE X 2)  LIPASE, BLOOD  LACTIC ACID, PLASMA  HEPATITIS B SURFACE ANTIGEN  HEPATITIS B SURFACE ANTIBODY,QUALITATIVE  HEPATITIS B SURFACE ANTIBODY, QUANTITATIVE  HEPATITIS B CORE ANTIBODY, TOTAL   HEPATITIS C ANTIBODY  TROPONIN I (HIGH SENSITIVITY)    EKG None  Radiology DG Chest Port 1 View  Result Date: 11/02/2021 CLINICAL DATA:  Shortness of breath EXAM: PORTABLE CHEST 1 VIEW COMPARISON:  September 24, 2021 FINDINGS: Cardiomegaly and pulmonary edema identified. No pneumothorax. The hila and mediastinum are unremarkable. IMPRESSION: Cardiomegaly and pulmonary edema. Electronically Signed   By: Dorise Bullion III M.D.   On: 11/02/2021  12:48    Procedures Procedures   EJ placement: 18 gauge IV placed in L EJ. Skin prepped with alcohol pads, L EJ identified with Valsalva. Cannulated with good return of dark, non-pulsatile blood. Tachyderm placed after easily flushed with NS.    Medications Ordered in ED Medications  nitroGLYCERIN (NITROSTAT) SL tablet 0.4 mg (0.4 mg Sublingual Given 11/02/21 1249)  Chlorhexidine Gluconate Cloth 2 % PADS 6 each (has no administration in time range)  enalaprilat (VASOTEC) injection 1.25 mg (1.25 mg Intravenous Given 11/02/21 1334)    ED Course/ Medical Decision Making/ A&P                           Medical Decision Making Amount and/or Complexity of Data Reviewed Labs: ordered. Radiology: ordered.  Risk Prescription drug management.   66 yo F with a chief complaints of difficulty breathing.  This is not uncommon for her on Sunday but it is much worse today than it is typically.  She also feels like she is maybe been sick but has trouble describing it tells me that she feels unwell and that she is felt hot all over and has been a little congested.  We will obtain a laboratory evaluation chest x-ray reassess.  Patient with chest x-ray independently interpreted by me with pulmonary edema.  BNP is elevated.  Minimally elevated potassium.  BUN mildly elevated.  Troponin negative.  I discussed the case with Dr. Melvia Heaps, nephrology will arrange for urgent dialysis.  Patient did have an episode where she felt she was having worsening trouble breathing  with tachypnea.  Not profoundly hypoxic but was started on BiPAP.  Given nitro.  This actually improved her symptoms somewhat and was able to be titrated off.   CRITICAL CARE Performed by: Cecilio Asper   Total critical care time: 70 minutes  Critical care time was exclusive of separately billable procedures and treating other patients.  Critical care was necessary to treat or prevent imminent or life-threatening deterioration.  Critical care was time spent personally by me on the following activities: development of treatment plan with patient and/or surrogate as well as nursing, discussions with consultants, evaluation of patient's response to treatment, examination of patient, obtaining history from patient or surrogate, ordering and performing treatments and interventions, ordering and review of laboratory studies, ordering and review of radiographic studies, pulse oximetry and re-evaluation of patient's condition.  The patients results and plan were reviewed and discussed.   Any x-rays performed were independently reviewed by myself.   Differential diagnosis were considered with the presenting HPI.  Medications  nitroGLYCERIN (NITROSTAT) SL tablet 0.4 mg (0.4 mg Sublingual Given 11/02/21 1249)  Chlorhexidine Gluconate Cloth 2 % PADS 6 each (has no administration in time range)  enalaprilat (VASOTEC) injection 1.25 mg (1.25 mg Intravenous Given 11/02/21 1334)    Vitals:   11/02/21 1345 11/02/21 1400 11/02/21 1415 11/02/21 1445  BP: (!) 200/82 (!) 209/77 (!) 211/76 (!) 194/135  Pulse: 64 65 64 70  Resp: 17 (!) 24 (!) 23 (!) 21  Temp:      TempSrc:      SpO2: 100% 100% 100% (!) 87%    Final diagnoses:  Acute pulmonary edema (H[facility])          Final Clinical Impression(s) / ED Diagnoses Final diagnoses:  Acute pulmonary edema ([place])    Rx / DC Orders ED Discharge Orders     None  Deno Etienne, DO 11/02/21 1539

## 2021-11-02 NOTE — Progress Notes (Signed)
Notified hepatitis lab resulted negative at 1530 pt clear to come to unit

## 2021-11-02 NOTE — ED Notes (Signed)
RT at bedside to place pt on BiPAP

## 2021-11-02 NOTE — ED Notes (Signed)
Paper consent signed for HD by pt and this RN.

## 2021-11-03 ENCOUNTER — Emergency Department (HOSPITAL_COMMUNITY): Payer: Medicare Other

## 2021-11-03 DIAGNOSIS — K5909 Other constipation: Secondary | ICD-10-CM | POA: Diagnosis not present

## 2021-11-03 DIAGNOSIS — K219 Gastro-esophageal reflux disease without esophagitis: Secondary | ICD-10-CM | POA: Diagnosis not present

## 2021-11-03 DIAGNOSIS — E1122 Type 2 diabetes mellitus with diabetic chronic kidney disease: Secondary | ICD-10-CM | POA: Diagnosis not present

## 2021-11-03 DIAGNOSIS — Z992 Dependence on renal dialysis: Secondary | ICD-10-CM | POA: Diagnosis not present

## 2021-11-03 DIAGNOSIS — E782 Mixed hyperlipidemia: Secondary | ICD-10-CM | POA: Diagnosis not present

## 2021-11-03 DIAGNOSIS — R269 Unspecified abnormalities of gait and mobility: Secondary | ICD-10-CM | POA: Diagnosis not present

## 2021-11-03 DIAGNOSIS — R42 Dizziness and giddiness: Secondary | ICD-10-CM | POA: Diagnosis not present

## 2021-11-03 DIAGNOSIS — J810 Acute pulmonary edema: Secondary | ICD-10-CM | POA: Diagnosis not present

## 2021-11-03 DIAGNOSIS — R519 Headache, unspecified: Secondary | ICD-10-CM | POA: Diagnosis not present

## 2021-11-03 DIAGNOSIS — N186 End stage renal disease: Secondary | ICD-10-CM | POA: Diagnosis not present

## 2021-11-03 DIAGNOSIS — I10 Essential (primary) hypertension: Secondary | ICD-10-CM | POA: Diagnosis not present

## 2021-11-03 MED ORDER — ACETAMINOPHEN 325 MG PO TABS
650.0000 mg | ORAL_TABLET | Freq: Four times a day (QID) | ORAL | Status: DC | PRN
  Administered 2021-11-03: 650 mg via ORAL
  Filled 2021-11-03: qty 2

## 2021-11-03 NOTE — Progress Notes (Signed)
Received patient in stretcher, alert and oriented. Informed consent signed and in chart.  Time tx initiated:22:09  Pre HD weight: 69.5 kg Pre HD VS:72, 22 220/85, 98.4, 69.5 kg  Time tx completed:01:30  HD treatment completed. Patient tolerated well. Fistula without signs and symptoms of complications. Patient transported to the ED to be discharged home, alert, oriented, and in no acute distress. Prior Rachael Burke, Therapist, sports.  Total UF removed:01:30  Medication given: none  Post HD VS: 76, 21, 177/78, 96%, 67 kg  Post HD weight: 67 kg

## 2021-11-03 NOTE — ED Provider Notes (Signed)
Patient with ESRD on hemodialysis returns from dialysis for reevaluation.  At time of assessment patient complains of headache that started around 1 AM.  Her breathing is improved.  Plan to treat headache.    CT head without acute abnormality.  She was treated with acetaminophen for her headache.  Patient reports feeling improved.  She is hypertensive but did miss her medications today.  Discussed with patient and son via language interpreter.  Patient is requesting discharge.  Son states that headaches are typical after her dialysis session.  Plan to discharge home with outpatient follow-up and return precautions.   Quintella Reichert, MD 11/03/21 [phone]

## 2021-11-03 NOTE — ED Notes (Signed)
Son at bedside.

## 2021-11-04 LAB — HEPATITIS B SURFACE ANTIBODY, QUANTITATIVE: Hep B S AB Quant (Post): 9.5 m[IU]/mL — ABNORMAL LOW (ref >9.9)

## 2021-11-05 DIAGNOSIS — R52 Pain, unspecified: Secondary | ICD-10-CM | POA: Diagnosis not present

## 2021-11-05 DIAGNOSIS — Z992 Dependence on renal dialysis: Secondary | ICD-10-CM | POA: Diagnosis not present

## 2021-11-05 DIAGNOSIS — D631 Anemia in chronic kidney disease: Secondary | ICD-10-CM | POA: Diagnosis not present

## 2021-11-05 DIAGNOSIS — E1122 Type 2 diabetes mellitus with diabetic chronic kidney disease: Secondary | ICD-10-CM | POA: Diagnosis not present

## 2021-11-05 DIAGNOSIS — N186 End stage renal disease: Secondary | ICD-10-CM | POA: Diagnosis not present

## 2021-11-05 DIAGNOSIS — D509 Iron deficiency anemia, unspecified: Secondary | ICD-10-CM | POA: Diagnosis not present

## 2021-11-05 DIAGNOSIS — N2581 Secondary hyperparathyroidism of renal origin: Secondary | ICD-10-CM | POA: Diagnosis not present

## 2021-11-07 ENCOUNTER — Ambulatory Visit (INDEPENDENT_AMBULATORY_CARE_PROVIDER_SITE_OTHER): Payer: Medicare Other

## 2021-11-07 ENCOUNTER — Ambulatory Visit (INDEPENDENT_AMBULATORY_CARE_PROVIDER_SITE_OTHER): Payer: Medicare Other | Admitting: Nurse Practitioner

## 2021-11-07 ENCOUNTER — Encounter: Payer: Self-pay | Admitting: Nurse Practitioner

## 2021-11-07 VITALS — BP 130/82 | HR 78 | Ht 63.0 in | Wt 151.8 lb

## 2021-11-07 DIAGNOSIS — J810 Acute pulmonary edema: Secondary | ICD-10-CM

## 2021-11-07 DIAGNOSIS — R0609 Other forms of dyspnea: Secondary | ICD-10-CM

## 2021-11-07 DIAGNOSIS — D631 Anemia in chronic kidney disease: Secondary | ICD-10-CM | POA: Diagnosis not present

## 2021-11-07 DIAGNOSIS — Z992 Dependence on renal dialysis: Secondary | ICD-10-CM

## 2021-11-07 DIAGNOSIS — R0602 Shortness of breath: Secondary | ICD-10-CM | POA: Diagnosis not present

## 2021-11-07 DIAGNOSIS — R52 Pain, unspecified: Secondary | ICD-10-CM | POA: Diagnosis not present

## 2021-11-07 DIAGNOSIS — N186 End stage renal disease: Secondary | ICD-10-CM

## 2021-11-07 DIAGNOSIS — R918 Other nonspecific abnormal finding of lung field: Secondary | ICD-10-CM

## 2021-11-07 DIAGNOSIS — E1122 Type 2 diabetes mellitus with diabetic chronic kidney disease: Secondary | ICD-10-CM | POA: Diagnosis not present

## 2021-11-07 DIAGNOSIS — D509 Iron deficiency anemia, unspecified: Secondary | ICD-10-CM | POA: Diagnosis not present

## 2021-11-07 DIAGNOSIS — J90 Pleural effusion, not elsewhere classified: Secondary | ICD-10-CM | POA: Diagnosis not present

## 2021-11-07 DIAGNOSIS — N2581 Secondary hyperparathyroidism of renal origin: Secondary | ICD-10-CM | POA: Diagnosis not present

## 2021-11-07 LAB — CULTURE, BLOOD (ROUTINE X 2)
Culture: NO GROWTH
Culture: NO GROWTH
Special Requests: ADEQUATE
Special Requests: ADEQUATE

## 2021-11-07 NOTE — Progress Notes (Signed)
Reviewed and agree with assessment/plan.   Chesley Mires, MD [facility] Pulmonary/Critical Care 11/07/2021, 2:53 PM Pager:  [phone]

## 2021-11-07 NOTE — Assessment & Plan Note (Signed)
Follow up previously  planned for 4-6 months from scan so anticipate sometime between September and November. Orders placed today.

## 2021-11-07 NOTE — Progress Notes (Signed)
@Patient  ID: Rachael Burke, female    DOB: 08/13/1955, 66 y.o.   MRN: [id number]  Chief Complaint  Patient presents with   Follow-up    She was in the ER on Sunday for Shortness of breath, She reports that she had a flare  up yesterday but is better today.     Referring provider: Trey Sailors, PA  HPI: 66 year old female, never smoker followed for pulmonary nodules and hemoptysis.  She underwent bronchoscopy on 09/26/2021 for further evaluation of her hemoptysis.  Exam was benign.  There were no sources of bleeding seen from nose, mouth, larynx, trachea or bronchi.  Her daughter did report at follow-up that she had noticed some bleeding in her mouth around her gums, which could have been the source. Past medical history significant for ESRD on HD M/W/F, HTN, DM, HLD, GAD, vertigo.  TEST/EVENTS:  09/21/2021 CTA chest: Negative for PE.  Cardiomegaly and pericardial effusion present.  Atherosclerosis.  No LAD.  There is interstitial pulmonary edema.  There are multiple few right upper lobe groundglass subpleural nodules.  Areas of probable rounded atelectasis in the right middle lobe, lingula and dependent portions of the bilateral lower lobes as well.  There is an 8 mm soft tissue mass with coarse central calcification in the right lower lobe and bilateral small pleural effusions. 11/02/2021 CXR 1 view: Cardiomegaly and pulmonary edema identified  10/13/2021: OV with Volanda Napoleon NP for follow-up after undergoing bronchoscopy with benign findings.  She also had a maxillofacial CT on 10/07/2021 that showed normal appearance of temporomandibular joints, sinuses were essentially clear with only trace mucosal thickening's along right maxillary floor.  She also had a CT neck which showed no evidence of mass or inflammation.  No visible source of bleeding was identified.  She did report that she was feeling better and no longer having any hemoptysis.  The daughter did note that she had previously seen  some bleeding in her mouth around her gums.  She has permanent lower dentures and upper removable dentures.  She was referred to a dentist for further oral exam/evaluation.  Previously incidental finding of pulmonary nodules on CT.  Recommended repeat in 4 to 6 months.  She did report that she had increased shortness of breath on days prior to dialysis; no other pulmonary concerns or complaints.  11/07/2021: Today-follow-up Patient presents today for hospital follow-up with daughter and interpreter.  She was recently seen in the ED on 11/02/2021 for increased shortness of breath and some chest discomfort.  She was found to have significant pulmonary edema and evidence of fluid overload so urgent dialysis was arranged.  Today, she reports feeling better.  Chest x-ray has significantly improved.  Her daughter was concerned that she may need oxygen at home.  She has problems with her breathing pretty much every Sunday.  She has dialysis M/W/F so her break from Friday to Monday is the longest.  She denies any cough, recurrence of hemoptysis, weight loss, anorexia, night sweats, fevers.  She has no lower extremity edema today but does also report that this gets worse on Sundays.  She is being worked up for kidney transplant.  No Known Allergies  Immunization History  Administered Date(s) Administered   Fluad Quad(high Dose 65+) 06/18/2020   Hepb-cpg 09/30/2021   Influenza, High Dose Seasonal PF 06/18/2020   Influenza,inj,Quad PF,6+ Mos 04/10/2019   Moderna Sars-Covid-2 Vaccination 10/11/2019, 11/08/2019   PNEUMOCOCCAL CONJUGATE-20 09/30/2021   Pfizer Covid-19 Vaccine Bivalent Booster 80yrs & up  09/30/2021   Pneumococcal Polysaccharide-23 04/10/2019   Zoster Recombinat (Shingrix) 09/30/2021    Past Medical History:  Diagnosis Date   Anemia    Anxiety    Arthritis    Diabetes mellitus without complication (HCC)    type 2   Heart murmur    echo 07/02/20: Mild MR/TR, mild-moderate AV sclerosis, bicuspid  or functional bicuspid AV, no evidence of AS. Murmr felt due to AV.   History of blood transfusion    Hyperlipidemia    Hypertension    Pneumonia    PONV (postoperative nausea and vomiting)    Renal disorder    M-W-F    Tobacco History: Social History   Tobacco Use  Smoking Status Never  Smokeless Tobacco Never   Counseling given: Not Answered   Outpatient Medications Prior to Visit  Medication Sig Dispense Refill   acetaminophen (TYLENOL) 325 MG tablet Take 2 tablets (650 mg total) by mouth every 6 (six) hours as needed for mild pain or moderate pain. 30 tablet 0   amLODipine (NORVASC) 10 MG tablet Take 10 mg by mouth daily.     atorvastatin (LIPITOR) 20 MG tablet Take 1 tablet (20 mg total) by mouth daily. 30 tablet 0   calcium acetate (PHOSLO) 667 MG capsule Take 667 mg by mouth 3 (three) times daily.     diclofenac Sodium (VOLTAREN) 1 % GEL Apply 4 g topically 4 (four) times daily. 50 g 3   gabapentin (NEURONTIN) 100 MG capsule Take 100 mg by mouth at bedtime.     hydrALAZINE (APRESOLINE) 25 MG tablet Take 1 tablet (25 mg total) by mouth 3 (three) times daily. 90 tablet 0   labetalol (NORMODYNE) 200 MG tablet Take 2 tablets (400 mg total) by mouth 2 (two) times daily. 120 tablet 0   linagliptin (TRADJENTA) 5 MG TABS tablet Take 5 mg by mouth daily.     losartan (COZAAR) 100 MG tablet Take 100 mg by mouth daily.     olopatadine (PATANOL) 0.1 % ophthalmic solution Place 1 drop into both eyes 2 (two) times daily as needed for allergies.     ondansetron (ZOFRAN-ODT) 4 MG disintegrating tablet Take 1 tablet (4 mg total) by mouth every 8 (eight) hours as needed for nausea or vomiting. 10 tablet 0   pantoprazole (PROTONIX) 40 MG tablet Take 1 tablet (40 mg total) by mouth daily. 60 tablet 0   escitalopram (LEXAPRO) 10 MG tablet Take 1 tablet (10 mg total) by mouth daily. (Patient not taking: Reported on 09/24/2021) 90 tablet 1   sitaGLIPtin (JANUVIA) 25 MG tablet Take 1 tablet (25 mg  total) by mouth daily. (Patient not taking: Reported on 09/24/2021) 90 tablet 3   No facility-administered medications prior to visit.     Review of Systems:   Constitutional: No weight loss or gain, night sweats, fevers, chills +fatigue (chronic) HEENT: No headaches, difficulty swallowing, tooth/dental problems, or sore throat. No sneezing, itching, ear ache, nasal congestion, or post nasal drip CV:  +swelling in lower extremities (resolved today; worse on Sundays). No chest pain, orthopnea, PND, anasarca, dizziness, palpitations, syncope Resp: +shortness of breath with exertion (resolved today; occurs on days off from dialysis and worse on Sunday). No excess mucus or change in color of mucus. No productive or non-productive. No hemoptysis. No wheezing.  No chest wall deformity GI:  No heartburn, indigestion, abdominal pain, nausea, vomiting, diarrhea, change in bowel habits, loss of appetite, bloody stools.  Skin: No rash, lesions, ulcerations MSK:  No  joint pain or swelling.  No decreased range of motion.  No back pain. Neuro: No dizziness or lightheadedness.  Psych: No depression or anxiety. Mood stable.     Physical Exam:  BP 130/82 (BP Location: Left Arm, Cuff Size: Normal)   Pulse 78   Ht 5\' 3"  (1.6 m)   Wt 151 lb 12.8 oz (68.9 kg)   SpO2 98%   BMI 26.89 kg/m   GEN: Pleasant, interactive, chronically-ill appearing; in no acute distress. HEENT:  Normocephalic and atraumatic. PERRLA. Sclera white. Nasal turbinates pink, moist and patent bilaterally. No rhinorrhea present. Oropharynx pink and moist, without exudate or edema. No lesions, ulcerations, or postnasal drip.  NECK:  Supple w/ fair ROM. No JVD present. Normal carotid impulses w/o bruits. Thyroid symmetrical with no goiter or nodules palpated. No lymphadenopathy.   CV: RRR, no m/r/g, no peripheral edema. Pulses intact, +2 bilaterally. No cyanosis, pallor or clubbing. PULMONARY:  Unlabored, regular breathing. Clear  bilaterally A&P w/o wheezes/rales/rhonchi. No accessory muscle use. No dullness to percussion. GI: BS present and normoactive. Soft, non-tender to palpation. No organomegaly or masses detected. No CVA tenderness. MSK: No erythema, warmth or tenderness. Cap refil <2 sec all extrem. No deformities or joint swelling noted.  Neuro: A/Ox3. No focal deficits noted.   Skin: Warm, no lesions or rashe Psych: Normal affect and behavior. Judgement and thought content appropriate.     Lab Results:  CBC    Component Value Date/Time   WBC 10.1 11/02/2021 1146   RBC 4.25 11/02/2021 1146   HGB 11.8 (L) 11/02/2021 1146   HGB 8.1 (L) 05/15/2020 1640   HCT 37.9 11/02/2021 1146   HCT 26.4 (L) 05/15/2020 1640   PLT 143 (L) 11/02/2021 1146   PLT 188 05/15/2020 1640   MCV 89.2 11/02/2021 1146   MCV 92 05/15/2020 1640   MCH 27.8 11/02/2021 1146   MCHC 31.1 11/02/2021 1146   RDW 18.0 (H) 11/02/2021 1146   RDW 13.6 05/15/2020 1640   LYMPHSABS 1.1 11/02/2021 1146   LYMPHSABS 0.9 05/15/2020 1640   MONOABS 0.8 11/02/2021 1146   EOSABS 0.2 11/02/2021 1146   EOSABS 0.1 05/15/2020 1640   BASOSABS 0.1 11/02/2021 1146   BASOSABS 0.0 05/15/2020 1640    BMET    Component Value Date/Time   NA 138 11/02/2021 1146   NA 138 05/15/2020 1640   K 5.9 (H) 11/02/2021 1146   CL 97 (L) 11/02/2021 1146   CO2 29 11/02/2021 1146   GLUCOSE 88 11/02/2021 1146   BUN 33 (H) 11/02/2021 1146   BUN 76 (HH) 05/15/2020 1640   CREATININE 7.67 (H) 11/02/2021 1146   CALCIUM 9.6 11/02/2021 1146   GFRNONAA 5 (L) 11/02/2021 1146   GFRAA 8 (L) 05/15/2020 1640    BNP    Component Value Date/Time   BNP 1,598.9 (H) 11/02/2021 1146     Imaging:  DG Chest 2 View  Result Date: 11/07/2021 CLINICAL DATA:  Shortness of breath. EXAM: CHEST - 2 VIEW COMPARISON:  Chest x-ray dated November 02, 2021. FINDINGS: Unchanged mild cardiomegaly. Mostly resolved interstitial thickening. Unchanged trace left pleural effusion. No consolidation  or pneumothorax. No acute osseous abnormality. IMPRESSION: 1. Resolved interstitial edema. 2. Unchanged trace left pleural effusion. Electronically Signed   By: Titus Dubin M.D.   On: 11/07/2021 12:28   CT Head Wo Contrast  Result Date: 11/03/2021 CLINICAL DATA:  Severe headache. EXAM: CT HEAD WITHOUT CONTRAST TECHNIQUE: Contiguous axial images were obtained from the base of the skull  through the vertex without intravenous contrast. RADIATION DOSE REDUCTION: This exam was performed according to the departmental dose-optimization program which includes automated exposure control, adjustment of the mA and/or kV according to patient size and/or use of iterative reconstruction technique. COMPARISON:  None Available. FINDINGS: Brain: There is mild cerebral atrophy with widening of the extra-axial spaces and ventricular dilatation. There are areas of decreased attenuation within the white matter tracts of the supratentorial brain, consistent with microvascular disease changes. Vascular: No hyperdense vessel or unexpected calcification. Skull: Normal. Negative for fracture or focal lesion. Sinuses/Orbits: No acute finding. Other: None. IMPRESSION: No acute intracranial abnormality. Electronically Signed   By: Virgina Norfolk M.D.   On: 11/03/2021 04:23   DG Chest Port 1 View  Result Date: 11/02/2021 CLINICAL DATA:  Shortness of breath EXAM: PORTABLE CHEST 1 VIEW COMPARISON:  September 24, 2021 FINDINGS: Cardiomegaly and pulmonary edema identified. No pneumothorax. The hila and mediastinum are unremarkable. IMPRESSION: Cardiomegaly and pulmonary edema. Electronically Signed   By: Dorise Bullion III M.D.   On: 11/02/2021 12:48   CT ABDOMEN PELVIS WO CONTRAST  Result Date: 10/14/2021 CLINICAL DATA:  66 year old female with history of left lower quadrant abdominal pain. Evaluate for potential bowel obstruction. EXAM: CT ABDOMEN AND PELVIS WITHOUT CONTRAST TECHNIQUE: Multidetector CT imaging of the abdomen and  pelvis was performed following the standard protocol without IV contrast. RADIATION DOSE REDUCTION: This exam was performed according to the departmental dose-optimization program which includes automated exposure control, adjustment of the mA and/or kV according to patient size and/or use of iterative reconstruction technique. COMPARISON:  CT the abdomen and pelvis 09/05/2021. FINDINGS: Lower chest: Atherosclerotic calcifications in the descending thoracic aorta. Trace left pleural effusion lying dependently. Hepatobiliary: No suspicious cystic or solid hepatic lesions are confidently identified on today's noncontrast CT examination. Status post cholecystectomy. Pancreas: No definite pancreatic mass or peripancreatic fluid collections or inflammatory changes are noted on today's noncontrast CT examination. Spleen: Unremarkable. Adrenals/Urinary Tract: There are no abnormal calcifications within the collecting system of either kidney, along the course of either ureter, or within the lumen of the urinary bladder. No hydroureteronephrosis or perinephric stranding to suggest urinary tract obstruction at this time. The unenhanced appearance of the kidneys is unremarkable bilaterally. Unenhanced appearance of the urinary bladder is normal. Bilateral adrenal glands are normal in appearance. Stomach/Bowel: Unenhanced appearance of the stomach is unremarkable. No pathologic dilatation of small bowel or colon. Normal appendix. Vascular/Lymphatic: Aortic atherosclerosis. No lymphadenopathy noted in the abdomen or pelvis on today's noncontrast CT examination. Reproductive: Numerous vascular calcifications are noted throughout the uterine body and fundus. Unenhanced appearance of the uterus and ovaries is otherwise unremarkable. Other: No significant volume of ascites.  No pneumoperitoneum. Musculoskeletal: There are no aggressive appearing lytic or blastic lesions noted in the visualized portions of the skeleton. IMPRESSION:  1. No acute findings are noted in the abdomen or pelvis on today's noncontrast CT examination. Specifically, no findings of bowel obstruction, and no evidence of obstructing urinary tract calculi. 2. Trace left pleural effusion lying dependently. 3. Aortic atherosclerosis. Electronically Signed   By: Vinnie Langton M.D.   On: 10/14/2021 05:08          No data to display          No results found for: "NITRICOXIDE"      Assessment & Plan:   DOE (dyspnea on exertion) SOB that occurs on days off from dialysis and worse on Sundays, which is her longest stretch between HD. There is  a direct correlation between this, her pulmonary edema/SOB, and leg swelling. She was seen in the ED this past Sunday, with mild hypoxemia (85% on room air) and was emergently dialyzed with resolve of symptoms and hypoxia. Her recent bronchoscopy was benign. I do not think she has any other underlying pulmonary etiology driving this. Spent significant time talking about this today. Walking oximetry without desaturations today and CXR significantly improved. Recommended she follow up with her nephrologist to determine if they could adjust the timing of her dialysis treatments or how much fluid they pull off.   Patient Instructions  CT chest without contrast in 3-5 months for evaluation of pulmonary nodules; someone will contact you for scheduling.  Discuss timing of dialysis on Fridays with your kidney doctor given worsening of your breathing and increased swelling on Sundays.  No need for oxygen at this time.  Follow up in office after your CT chest scan to review results. If symptoms do not improve or worsen, please contact office for sooner follow up or seek emergency care.    End stage renal disease on dialysis [facility]) HD M/W/F. Pulmonary edema and fluid overload, improved today after HD. Symptoms flare with days off dialysis, especially Sundays. See above plan.   Pulmonary nodules Follow up previously   planned for 4-6 months from scan so anticipate sometime between September and November. Orders placed today.   I spent 32 minutes of dedicated to the care of this patient on the date of this encounter to include pre-visit review of records, face-to-face time with the patient discussing conditions above, post visit ordering of testing, clinical documentation with the electronic health record, making appropriate referrals as documented, and communicating necessary findings to members of the patients care team.  Clayton Bibles, NP 11/07/2021  Pt aware and understands NP's role.

## 2021-11-07 NOTE — Patient Instructions (Signed)
CT chest without contrast in 3-5 months for evaluation of pulmonary nodules; someone will contact you for scheduling.  Discuss timing of dialysis on Fridays with your kidney doctor given worsening of your breathing and increased swelling on Sundays.  No need for oxygen at this time.  Follow up in office after your CT chest scan to review results. If symptoms do not improve or worsen, please contact office for sooner follow up or seek emergency care.

## 2021-11-07 NOTE — Assessment & Plan Note (Addendum)
SOB that occurs on days off from dialysis and worse on Sundays, which is her longest stretch between HD. There is a direct correlation between this, her pulmonary edema/SOB, and leg swelling. She was seen in the ED this past Sunday, with mild hypoxemia (85% on room air) and was emergently dialyzed with resolve of symptoms and hypoxia. Her recent bronchoscopy was benign. I do not think she has any other underlying pulmonary etiology driving this. Spent significant time talking about this today. Walking oximetry without desaturations today and CXR significantly improved. Recommended she follow up with her nephrologist to determine if they could adjust the timing of her dialysis treatments or how much fluid they pull off.   Patient Instructions  CT chest without contrast in 3-5 months for evaluation of pulmonary nodules; someone will contact you for scheduling.  Discuss timing of dialysis on Fridays with your kidney doctor given worsening of your breathing and increased swelling on Sundays.  No need for oxygen at this time.  Follow up in office after your CT chest scan to review results. If symptoms do not improve or worsen, please contact office for sooner follow up or seek emergency care.

## 2021-11-07 NOTE — Assessment & Plan Note (Signed)
HD M/W/F. Pulmonary edema and fluid overload, improved today after HD. Symptoms flare with days off dialysis, especially Sundays. See above plan.

## 2021-11-10 DIAGNOSIS — Z992 Dependence on renal dialysis: Secondary | ICD-10-CM | POA: Diagnosis not present

## 2021-11-10 DIAGNOSIS — N2581 Secondary hyperparathyroidism of renal origin: Secondary | ICD-10-CM | POA: Diagnosis not present

## 2021-11-10 DIAGNOSIS — E1122 Type 2 diabetes mellitus with diabetic chronic kidney disease: Secondary | ICD-10-CM | POA: Diagnosis not present

## 2021-11-10 DIAGNOSIS — R52 Pain, unspecified: Secondary | ICD-10-CM | POA: Diagnosis not present

## 2021-11-10 DIAGNOSIS — N186 End stage renal disease: Secondary | ICD-10-CM | POA: Diagnosis not present

## 2021-11-10 DIAGNOSIS — D631 Anemia in chronic kidney disease: Secondary | ICD-10-CM | POA: Diagnosis not present

## 2021-11-10 DIAGNOSIS — D509 Iron deficiency anemia, unspecified: Secondary | ICD-10-CM | POA: Diagnosis not present

## 2021-11-13 ENCOUNTER — Telehealth: Payer: Self-pay | Admitting: Nurse Practitioner

## 2021-11-13 DIAGNOSIS — D631 Anemia in chronic kidney disease: Secondary | ICD-10-CM | POA: Diagnosis not present

## 2021-11-13 DIAGNOSIS — D509 Iron deficiency anemia, unspecified: Secondary | ICD-10-CM | POA: Diagnosis not present

## 2021-11-13 DIAGNOSIS — Z992 Dependence on renal dialysis: Secondary | ICD-10-CM | POA: Diagnosis not present

## 2021-11-13 DIAGNOSIS — R52 Pain, unspecified: Secondary | ICD-10-CM | POA: Diagnosis not present

## 2021-11-13 DIAGNOSIS — E1122 Type 2 diabetes mellitus with diabetic chronic kidney disease: Secondary | ICD-10-CM | POA: Diagnosis not present

## 2021-11-13 DIAGNOSIS — N186 End stage renal disease: Secondary | ICD-10-CM | POA: Diagnosis not present

## 2021-11-13 DIAGNOSIS — N2581 Secondary hyperparathyroidism of renal origin: Secondary | ICD-10-CM | POA: Diagnosis not present

## 2021-11-13 NOTE — Telephone Encounter (Signed)
Addendum made to note. Cleared for surgery as long as her pulmonary status is stable. Thanks.

## 2021-11-13 NOTE — Telephone Encounter (Signed)
Fax received from Dr. Tama High with [facility] to perform a Cataract extraction by PE, IOL- Right then Left on patient.  Patient needs surgery clearance. Patient was seen on 11/07/2021. Office protocol is a risk assessment can be sent to surgeon if patient has been seen in 60 days or less.   Sending to [facility] for risk assessment or recommendations if patient needs to be seen in office prior to surgical procedure.

## 2021-11-13 NOTE — Progress Notes (Signed)
Addendum 11/13/2021: Fax received from Dr. Tama High with [facility] to perform a Cataract extraction by PE, IOL- Right then Left on patient.  Patient needs surgery clearance.   Breathing stable at most recent OV after being treated in ED for pulmonary edema related to ESRD. CXR significantly improved after dialysis. As long as breathing and oxygen stable, ok to be cleared for cataract surgery.    Factors that increase the risk for postoperative pulmonary complications are age, and ESRD with recent pulmonary edema and hypoxic respiratory failure  Respiratory complications generally occur in 1% of ASA Class I patients, 5% of ASA Class II and 10% of ASA Class III-IV patients These complications rarely result in mortality and include postoperative pneumonia, atelectasis, pulmonary embolism, ARDS and increased time requiring postoperative mechanical ventilation.   Overall, I recommend proceeding with the surgery if the risk for respiratory complications are outweighed by the potential benefits. This will need to be discussed between the patient and surgeon.   To reduce risks of respiratory complications, I recommend: --Pre- and post-operative incentive spirometry performed frequently while awake --Recommend performing on day of or day following dialysis treatment, if possible, so fluid status is optimal --Short duration of surgery as much as possible and avoid paralytic if possible --OOB, encourage mobility post-op

## 2021-11-15 DIAGNOSIS — N2581 Secondary hyperparathyroidism of renal origin: Secondary | ICD-10-CM | POA: Diagnosis not present

## 2021-11-15 DIAGNOSIS — R52 Pain, unspecified: Secondary | ICD-10-CM | POA: Diagnosis not present

## 2021-11-15 DIAGNOSIS — D509 Iron deficiency anemia, unspecified: Secondary | ICD-10-CM | POA: Diagnosis not present

## 2021-11-15 DIAGNOSIS — E1122 Type 2 diabetes mellitus with diabetic chronic kidney disease: Secondary | ICD-10-CM | POA: Diagnosis not present

## 2021-11-15 DIAGNOSIS — N186 End stage renal disease: Secondary | ICD-10-CM | POA: Diagnosis not present

## 2021-11-15 DIAGNOSIS — D631 Anemia in chronic kidney disease: Secondary | ICD-10-CM | POA: Diagnosis not present

## 2021-11-15 DIAGNOSIS — Z992 Dependence on renal dialysis: Secondary | ICD-10-CM | POA: Diagnosis not present

## 2021-11-17 ENCOUNTER — Telehealth: Payer: Self-pay | Admitting: Nurse Practitioner

## 2021-11-17 DIAGNOSIS — R42 Dizziness and giddiness: Secondary | ICD-10-CM | POA: Diagnosis not present

## 2021-11-17 DIAGNOSIS — K219 Gastro-esophageal reflux disease without esophagitis: Secondary | ICD-10-CM | POA: Diagnosis not present

## 2021-11-17 DIAGNOSIS — R52 Pain, unspecified: Secondary | ICD-10-CM | POA: Diagnosis not present

## 2021-11-17 DIAGNOSIS — D509 Iron deficiency anemia, unspecified: Secondary | ICD-10-CM | POA: Diagnosis not present

## 2021-11-17 DIAGNOSIS — K5909 Other constipation: Secondary | ICD-10-CM | POA: Diagnosis not present

## 2021-11-17 DIAGNOSIS — D631 Anemia in chronic kidney disease: Secondary | ICD-10-CM | POA: Diagnosis not present

## 2021-11-17 DIAGNOSIS — Z992 Dependence on renal dialysis: Secondary | ICD-10-CM | POA: Diagnosis not present

## 2021-11-17 DIAGNOSIS — I10 Essential (primary) hypertension: Secondary | ICD-10-CM | POA: Diagnosis not present

## 2021-11-17 DIAGNOSIS — R269 Unspecified abnormalities of gait and mobility: Secondary | ICD-10-CM | POA: Diagnosis not present

## 2021-11-17 DIAGNOSIS — N186 End stage renal disease: Secondary | ICD-10-CM | POA: Diagnosis not present

## 2021-11-17 DIAGNOSIS — E782 Mixed hyperlipidemia: Secondary | ICD-10-CM | POA: Diagnosis not present

## 2021-11-17 DIAGNOSIS — E1122 Type 2 diabetes mellitus with diabetic chronic kidney disease: Secondary | ICD-10-CM | POA: Diagnosis not present

## 2021-11-17 DIAGNOSIS — R202 Paresthesia of skin: Secondary | ICD-10-CM | POA: Diagnosis not present

## 2021-11-17 DIAGNOSIS — N2581 Secondary hyperparathyroidism of renal origin: Secondary | ICD-10-CM | POA: Diagnosis not present

## 2021-11-17 NOTE — Telephone Encounter (Signed)
Pt's OV notes have been printed to be faxed to [facility] for pt.

## 2021-11-19 DIAGNOSIS — E1122 Type 2 diabetes mellitus with diabetic chronic kidney disease: Secondary | ICD-10-CM | POA: Diagnosis not present

## 2021-11-19 DIAGNOSIS — R52 Pain, unspecified: Secondary | ICD-10-CM | POA: Diagnosis not present

## 2021-11-19 DIAGNOSIS — N2581 Secondary hyperparathyroidism of renal origin: Secondary | ICD-10-CM | POA: Diagnosis not present

## 2021-11-19 DIAGNOSIS — Z992 Dependence on renal dialysis: Secondary | ICD-10-CM | POA: Diagnosis not present

## 2021-11-19 DIAGNOSIS — D631 Anemia in chronic kidney disease: Secondary | ICD-10-CM | POA: Diagnosis not present

## 2021-11-19 DIAGNOSIS — N186 End stage renal disease: Secondary | ICD-10-CM | POA: Diagnosis not present

## 2021-11-19 DIAGNOSIS — D509 Iron deficiency anemia, unspecified: Secondary | ICD-10-CM | POA: Diagnosis not present

## 2021-11-21 DIAGNOSIS — Z992 Dependence on renal dialysis: Secondary | ICD-10-CM | POA: Diagnosis not present

## 2021-11-21 DIAGNOSIS — E1122 Type 2 diabetes mellitus with diabetic chronic kidney disease: Secondary | ICD-10-CM | POA: Diagnosis not present

## 2021-11-21 DIAGNOSIS — R52 Pain, unspecified: Secondary | ICD-10-CM | POA: Diagnosis not present

## 2021-11-21 DIAGNOSIS — D631 Anemia in chronic kidney disease: Secondary | ICD-10-CM | POA: Diagnosis not present

## 2021-11-21 DIAGNOSIS — D509 Iron deficiency anemia, unspecified: Secondary | ICD-10-CM | POA: Diagnosis not present

## 2021-11-21 DIAGNOSIS — N2581 Secondary hyperparathyroidism of renal origin: Secondary | ICD-10-CM | POA: Diagnosis not present

## 2021-11-21 DIAGNOSIS — N186 End stage renal disease: Secondary | ICD-10-CM | POA: Diagnosis not present

## 2021-11-24 DIAGNOSIS — D631 Anemia in chronic kidney disease: Secondary | ICD-10-CM | POA: Diagnosis not present

## 2021-11-24 DIAGNOSIS — E1122 Type 2 diabetes mellitus with diabetic chronic kidney disease: Secondary | ICD-10-CM | POA: Diagnosis not present

## 2021-11-24 DIAGNOSIS — D509 Iron deficiency anemia, unspecified: Secondary | ICD-10-CM | POA: Diagnosis not present

## 2021-11-24 DIAGNOSIS — N2581 Secondary hyperparathyroidism of renal origin: Secondary | ICD-10-CM | POA: Diagnosis not present

## 2021-11-24 DIAGNOSIS — Z992 Dependence on renal dialysis: Secondary | ICD-10-CM | POA: Diagnosis not present

## 2021-11-24 DIAGNOSIS — R52 Pain, unspecified: Secondary | ICD-10-CM | POA: Diagnosis not present

## 2021-11-24 DIAGNOSIS — N186 End stage renal disease: Secondary | ICD-10-CM | POA: Diagnosis not present

## 2021-11-26 DIAGNOSIS — R52 Pain, unspecified: Secondary | ICD-10-CM | POA: Diagnosis not present

## 2021-11-26 DIAGNOSIS — D509 Iron deficiency anemia, unspecified: Secondary | ICD-10-CM | POA: Diagnosis not present

## 2021-11-26 DIAGNOSIS — N2581 Secondary hyperparathyroidism of renal origin: Secondary | ICD-10-CM | POA: Diagnosis not present

## 2021-11-26 DIAGNOSIS — E1122 Type 2 diabetes mellitus with diabetic chronic kidney disease: Secondary | ICD-10-CM | POA: Diagnosis not present

## 2021-11-26 DIAGNOSIS — Z992 Dependence on renal dialysis: Secondary | ICD-10-CM | POA: Diagnosis not present

## 2021-11-26 DIAGNOSIS — D631 Anemia in chronic kidney disease: Secondary | ICD-10-CM | POA: Diagnosis not present

## 2021-11-26 DIAGNOSIS — N186 End stage renal disease: Secondary | ICD-10-CM | POA: Diagnosis not present

## 2021-11-27 DIAGNOSIS — H25812 Combined forms of age-related cataract, left eye: Secondary | ICD-10-CM | POA: Diagnosis not present

## 2021-11-27 DIAGNOSIS — Z01818 Encounter for other preprocedural examination: Secondary | ICD-10-CM | POA: Diagnosis not present

## 2021-11-27 DIAGNOSIS — H269 Unspecified cataract: Secondary | ICD-10-CM | POA: Diagnosis not present

## 2021-11-27 DIAGNOSIS — H25811 Combined forms of age-related cataract, right eye: Secondary | ICD-10-CM | POA: Diagnosis not present

## 2021-11-27 NOTE — Telephone Encounter (Signed)
OV notes and clearance form have been faxed back to [facility]. Nothing further needed at this time. °

## 2021-11-28 DIAGNOSIS — E1122 Type 2 diabetes mellitus with diabetic chronic kidney disease: Secondary | ICD-10-CM | POA: Diagnosis not present

## 2021-11-28 DIAGNOSIS — Z992 Dependence on renal dialysis: Secondary | ICD-10-CM | POA: Diagnosis not present

## 2021-11-28 DIAGNOSIS — R52 Pain, unspecified: Secondary | ICD-10-CM | POA: Diagnosis not present

## 2021-11-28 DIAGNOSIS — D631 Anemia in chronic kidney disease: Secondary | ICD-10-CM | POA: Diagnosis not present

## 2021-11-28 DIAGNOSIS — E1129 Type 2 diabetes mellitus with other diabetic kidney complication: Secondary | ICD-10-CM | POA: Diagnosis not present

## 2021-11-28 DIAGNOSIS — N186 End stage renal disease: Secondary | ICD-10-CM | POA: Diagnosis not present

## 2021-11-28 DIAGNOSIS — D509 Iron deficiency anemia, unspecified: Secondary | ICD-10-CM | POA: Diagnosis not present

## 2021-11-28 DIAGNOSIS — N2581 Secondary hyperparathyroidism of renal origin: Secondary | ICD-10-CM | POA: Diagnosis not present

## 2021-12-01 DIAGNOSIS — E1122 Type 2 diabetes mellitus with diabetic chronic kidney disease: Secondary | ICD-10-CM | POA: Diagnosis not present

## 2021-12-01 DIAGNOSIS — Z992 Dependence on renal dialysis: Secondary | ICD-10-CM | POA: Diagnosis not present

## 2021-12-01 DIAGNOSIS — D509 Iron deficiency anemia, unspecified: Secondary | ICD-10-CM | POA: Diagnosis not present

## 2021-12-01 DIAGNOSIS — N2581 Secondary hyperparathyroidism of renal origin: Secondary | ICD-10-CM | POA: Diagnosis not present

## 2021-12-01 DIAGNOSIS — D631 Anemia in chronic kidney disease: Secondary | ICD-10-CM | POA: Diagnosis not present

## 2021-12-01 DIAGNOSIS — N186 End stage renal disease: Secondary | ICD-10-CM | POA: Diagnosis not present

## 2021-12-01 DIAGNOSIS — R52 Pain, unspecified: Secondary | ICD-10-CM | POA: Diagnosis not present

## 2021-12-03 DIAGNOSIS — N2581 Secondary hyperparathyroidism of renal origin: Secondary | ICD-10-CM | POA: Diagnosis not present

## 2021-12-03 DIAGNOSIS — R52 Pain, unspecified: Secondary | ICD-10-CM | POA: Diagnosis not present

## 2021-12-03 DIAGNOSIS — D631 Anemia in chronic kidney disease: Secondary | ICD-10-CM | POA: Diagnosis not present

## 2021-12-03 DIAGNOSIS — Z992 Dependence on renal dialysis: Secondary | ICD-10-CM | POA: Diagnosis not present

## 2021-12-03 DIAGNOSIS — E1122 Type 2 diabetes mellitus with diabetic chronic kidney disease: Secondary | ICD-10-CM | POA: Diagnosis not present

## 2021-12-03 DIAGNOSIS — N186 End stage renal disease: Secondary | ICD-10-CM | POA: Diagnosis not present

## 2021-12-03 DIAGNOSIS — D509 Iron deficiency anemia, unspecified: Secondary | ICD-10-CM | POA: Diagnosis not present

## 2021-12-05 DIAGNOSIS — D509 Iron deficiency anemia, unspecified: Secondary | ICD-10-CM | POA: Diagnosis not present

## 2021-12-05 DIAGNOSIS — D631 Anemia in chronic kidney disease: Secondary | ICD-10-CM | POA: Diagnosis not present

## 2021-12-05 DIAGNOSIS — R52 Pain, unspecified: Secondary | ICD-10-CM | POA: Diagnosis not present

## 2021-12-05 DIAGNOSIS — E1122 Type 2 diabetes mellitus with diabetic chronic kidney disease: Secondary | ICD-10-CM | POA: Diagnosis not present

## 2021-12-05 DIAGNOSIS — N2581 Secondary hyperparathyroidism of renal origin: Secondary | ICD-10-CM | POA: Diagnosis not present

## 2021-12-05 DIAGNOSIS — N186 End stage renal disease: Secondary | ICD-10-CM | POA: Diagnosis not present

## 2021-12-05 DIAGNOSIS — Z992 Dependence on renal dialysis: Secondary | ICD-10-CM | POA: Diagnosis not present

## 2021-12-08 DIAGNOSIS — E1122 Type 2 diabetes mellitus with diabetic chronic kidney disease: Secondary | ICD-10-CM | POA: Diagnosis not present

## 2021-12-08 DIAGNOSIS — D509 Iron deficiency anemia, unspecified: Secondary | ICD-10-CM | POA: Diagnosis not present

## 2021-12-08 DIAGNOSIS — D631 Anemia in chronic kidney disease: Secondary | ICD-10-CM | POA: Diagnosis not present

## 2021-12-08 DIAGNOSIS — R52 Pain, unspecified: Secondary | ICD-10-CM | POA: Diagnosis not present

## 2021-12-08 DIAGNOSIS — N186 End stage renal disease: Secondary | ICD-10-CM | POA: Diagnosis not present

## 2021-12-08 DIAGNOSIS — N2581 Secondary hyperparathyroidism of renal origin: Secondary | ICD-10-CM | POA: Diagnosis not present

## 2021-12-08 DIAGNOSIS — Z992 Dependence on renal dialysis: Secondary | ICD-10-CM | POA: Diagnosis not present

## 2021-12-10 DIAGNOSIS — D631 Anemia in chronic kidney disease: Secondary | ICD-10-CM | POA: Diagnosis not present

## 2021-12-10 DIAGNOSIS — E1122 Type 2 diabetes mellitus with diabetic chronic kidney disease: Secondary | ICD-10-CM | POA: Diagnosis not present

## 2021-12-10 DIAGNOSIS — R52 Pain, unspecified: Secondary | ICD-10-CM | POA: Diagnosis not present

## 2021-12-10 DIAGNOSIS — N2581 Secondary hyperparathyroidism of renal origin: Secondary | ICD-10-CM | POA: Diagnosis not present

## 2021-12-10 DIAGNOSIS — N186 End stage renal disease: Secondary | ICD-10-CM | POA: Diagnosis not present

## 2021-12-10 DIAGNOSIS — D509 Iron deficiency anemia, unspecified: Secondary | ICD-10-CM | POA: Diagnosis not present

## 2021-12-10 DIAGNOSIS — Z992 Dependence on renal dialysis: Secondary | ICD-10-CM | POA: Diagnosis not present

## 2021-12-12 DIAGNOSIS — E1122 Type 2 diabetes mellitus with diabetic chronic kidney disease: Secondary | ICD-10-CM | POA: Diagnosis not present

## 2021-12-12 DIAGNOSIS — N186 End stage renal disease: Secondary | ICD-10-CM | POA: Diagnosis not present

## 2021-12-12 DIAGNOSIS — D509 Iron deficiency anemia, unspecified: Secondary | ICD-10-CM | POA: Diagnosis not present

## 2021-12-12 DIAGNOSIS — R52 Pain, unspecified: Secondary | ICD-10-CM | POA: Diagnosis not present

## 2021-12-12 DIAGNOSIS — Z992 Dependence on renal dialysis: Secondary | ICD-10-CM | POA: Diagnosis not present

## 2021-12-12 DIAGNOSIS — D631 Anemia in chronic kidney disease: Secondary | ICD-10-CM | POA: Diagnosis not present

## 2021-12-12 DIAGNOSIS — N2581 Secondary hyperparathyroidism of renal origin: Secondary | ICD-10-CM | POA: Diagnosis not present

## 2021-12-15 DIAGNOSIS — Z992 Dependence on renal dialysis: Secondary | ICD-10-CM | POA: Diagnosis not present

## 2021-12-15 DIAGNOSIS — R52 Pain, unspecified: Secondary | ICD-10-CM | POA: Diagnosis not present

## 2021-12-15 DIAGNOSIS — R202 Paresthesia of skin: Secondary | ICD-10-CM | POA: Diagnosis not present

## 2021-12-15 DIAGNOSIS — Z0001 Encounter for general adult medical examination with abnormal findings: Secondary | ICD-10-CM | POA: Diagnosis not present

## 2021-12-15 DIAGNOSIS — R42 Dizziness and giddiness: Secondary | ICD-10-CM | POA: Diagnosis not present

## 2021-12-15 DIAGNOSIS — K5909 Other constipation: Secondary | ICD-10-CM | POA: Diagnosis not present

## 2021-12-15 DIAGNOSIS — D509 Iron deficiency anemia, unspecified: Secondary | ICD-10-CM | POA: Diagnosis not present

## 2021-12-15 DIAGNOSIS — D631 Anemia in chronic kidney disease: Secondary | ICD-10-CM | POA: Diagnosis not present

## 2021-12-15 DIAGNOSIS — N2581 Secondary hyperparathyroidism of renal origin: Secondary | ICD-10-CM | POA: Diagnosis not present

## 2021-12-15 DIAGNOSIS — E1122 Type 2 diabetes mellitus with diabetic chronic kidney disease: Secondary | ICD-10-CM | POA: Diagnosis not present

## 2021-12-15 DIAGNOSIS — K219 Gastro-esophageal reflux disease without esophagitis: Secondary | ICD-10-CM | POA: Diagnosis not present

## 2021-12-15 DIAGNOSIS — E782 Mixed hyperlipidemia: Secondary | ICD-10-CM | POA: Diagnosis not present

## 2021-12-15 DIAGNOSIS — N186 End stage renal disease: Secondary | ICD-10-CM | POA: Diagnosis not present

## 2021-12-15 DIAGNOSIS — R269 Unspecified abnormalities of gait and mobility: Secondary | ICD-10-CM | POA: Diagnosis not present

## 2021-12-15 DIAGNOSIS — R011 Cardiac murmur, unspecified: Secondary | ICD-10-CM | POA: Diagnosis not present

## 2021-12-15 DIAGNOSIS — I10 Essential (primary) hypertension: Secondary | ICD-10-CM | POA: Diagnosis not present

## 2021-12-16 DIAGNOSIS — Z9889 Other specified postprocedural states: Secondary | ICD-10-CM | POA: Diagnosis not present

## 2021-12-17 DIAGNOSIS — Z992 Dependence on renal dialysis: Secondary | ICD-10-CM | POA: Diagnosis not present

## 2021-12-17 DIAGNOSIS — N186 End stage renal disease: Secondary | ICD-10-CM | POA: Diagnosis not present

## 2021-12-17 DIAGNOSIS — D631 Anemia in chronic kidney disease: Secondary | ICD-10-CM | POA: Diagnosis not present

## 2021-12-17 DIAGNOSIS — R52 Pain, unspecified: Secondary | ICD-10-CM | POA: Diagnosis not present

## 2021-12-17 DIAGNOSIS — D509 Iron deficiency anemia, unspecified: Secondary | ICD-10-CM | POA: Diagnosis not present

## 2021-12-17 DIAGNOSIS — N2581 Secondary hyperparathyroidism of renal origin: Secondary | ICD-10-CM | POA: Diagnosis not present

## 2021-12-17 DIAGNOSIS — E1122 Type 2 diabetes mellitus with diabetic chronic kidney disease: Secondary | ICD-10-CM | POA: Diagnosis not present

## 2021-12-19 DIAGNOSIS — Z992 Dependence on renal dialysis: Secondary | ICD-10-CM | POA: Diagnosis not present

## 2021-12-19 DIAGNOSIS — N2581 Secondary hyperparathyroidism of renal origin: Secondary | ICD-10-CM | POA: Diagnosis not present

## 2021-12-19 DIAGNOSIS — N186 End stage renal disease: Secondary | ICD-10-CM | POA: Diagnosis not present

## 2021-12-19 DIAGNOSIS — D631 Anemia in chronic kidney disease: Secondary | ICD-10-CM | POA: Diagnosis not present

## 2021-12-19 DIAGNOSIS — E1122 Type 2 diabetes mellitus with diabetic chronic kidney disease: Secondary | ICD-10-CM | POA: Diagnosis not present

## 2021-12-19 DIAGNOSIS — R52 Pain, unspecified: Secondary | ICD-10-CM | POA: Diagnosis not present

## 2021-12-19 DIAGNOSIS — D509 Iron deficiency anemia, unspecified: Secondary | ICD-10-CM | POA: Diagnosis not present

## 2021-12-22 DIAGNOSIS — D631 Anemia in chronic kidney disease: Secondary | ICD-10-CM | POA: Diagnosis not present

## 2021-12-22 DIAGNOSIS — Z992 Dependence on renal dialysis: Secondary | ICD-10-CM | POA: Diagnosis not present

## 2021-12-22 DIAGNOSIS — E1122 Type 2 diabetes mellitus with diabetic chronic kidney disease: Secondary | ICD-10-CM | POA: Diagnosis not present

## 2021-12-22 DIAGNOSIS — R52 Pain, unspecified: Secondary | ICD-10-CM | POA: Diagnosis not present

## 2021-12-22 DIAGNOSIS — N186 End stage renal disease: Secondary | ICD-10-CM | POA: Diagnosis not present

## 2021-12-22 DIAGNOSIS — N2581 Secondary hyperparathyroidism of renal origin: Secondary | ICD-10-CM | POA: Diagnosis not present

## 2021-12-22 DIAGNOSIS — D509 Iron deficiency anemia, unspecified: Secondary | ICD-10-CM | POA: Diagnosis not present

## 2021-12-24 DIAGNOSIS — R52 Pain, unspecified: Secondary | ICD-10-CM | POA: Diagnosis not present

## 2021-12-24 DIAGNOSIS — D631 Anemia in chronic kidney disease: Secondary | ICD-10-CM | POA: Diagnosis not present

## 2021-12-24 DIAGNOSIS — N186 End stage renal disease: Secondary | ICD-10-CM | POA: Diagnosis not present

## 2021-12-24 DIAGNOSIS — D509 Iron deficiency anemia, unspecified: Secondary | ICD-10-CM | POA: Diagnosis not present

## 2021-12-24 DIAGNOSIS — Z992 Dependence on renal dialysis: Secondary | ICD-10-CM | POA: Diagnosis not present

## 2021-12-24 DIAGNOSIS — N2581 Secondary hyperparathyroidism of renal origin: Secondary | ICD-10-CM | POA: Diagnosis not present

## 2021-12-24 DIAGNOSIS — E1122 Type 2 diabetes mellitus with diabetic chronic kidney disease: Secondary | ICD-10-CM | POA: Diagnosis not present

## 2021-12-25 DIAGNOSIS — H25812 Combined forms of age-related cataract, left eye: Secondary | ICD-10-CM | POA: Diagnosis not present

## 2021-12-25 DIAGNOSIS — H269 Unspecified cataract: Secondary | ICD-10-CM | POA: Diagnosis not present

## 2021-12-26 DIAGNOSIS — R52 Pain, unspecified: Secondary | ICD-10-CM | POA: Diagnosis not present

## 2021-12-26 DIAGNOSIS — D509 Iron deficiency anemia, unspecified: Secondary | ICD-10-CM | POA: Diagnosis not present

## 2021-12-26 DIAGNOSIS — N2581 Secondary hyperparathyroidism of renal origin: Secondary | ICD-10-CM | POA: Diagnosis not present

## 2021-12-26 DIAGNOSIS — D631 Anemia in chronic kidney disease: Secondary | ICD-10-CM | POA: Diagnosis not present

## 2021-12-26 DIAGNOSIS — N186 End stage renal disease: Secondary | ICD-10-CM | POA: Diagnosis not present

## 2021-12-26 DIAGNOSIS — Z992 Dependence on renal dialysis: Secondary | ICD-10-CM | POA: Diagnosis not present

## 2021-12-26 DIAGNOSIS — E1122 Type 2 diabetes mellitus with diabetic chronic kidney disease: Secondary | ICD-10-CM | POA: Diagnosis not present

## 2021-12-29 DIAGNOSIS — N2581 Secondary hyperparathyroidism of renal origin: Secondary | ICD-10-CM | POA: Diagnosis not present

## 2021-12-29 DIAGNOSIS — D631 Anemia in chronic kidney disease: Secondary | ICD-10-CM | POA: Diagnosis not present

## 2021-12-29 DIAGNOSIS — D509 Iron deficiency anemia, unspecified: Secondary | ICD-10-CM | POA: Diagnosis not present

## 2021-12-29 DIAGNOSIS — R52 Pain, unspecified: Secondary | ICD-10-CM | POA: Diagnosis not present

## 2021-12-29 DIAGNOSIS — Z992 Dependence on renal dialysis: Secondary | ICD-10-CM | POA: Diagnosis not present

## 2021-12-29 DIAGNOSIS — E1129 Type 2 diabetes mellitus with other diabetic kidney complication: Secondary | ICD-10-CM | POA: Diagnosis not present

## 2021-12-29 DIAGNOSIS — N186 End stage renal disease: Secondary | ICD-10-CM | POA: Diagnosis not present

## 2021-12-29 DIAGNOSIS — E1122 Type 2 diabetes mellitus with diabetic chronic kidney disease: Secondary | ICD-10-CM | POA: Diagnosis not present

## 2021-12-31 DIAGNOSIS — N186 End stage renal disease: Secondary | ICD-10-CM | POA: Diagnosis not present

## 2021-12-31 DIAGNOSIS — R197 Diarrhea, unspecified: Secondary | ICD-10-CM | POA: Diagnosis not present

## 2021-12-31 DIAGNOSIS — R110 Nausea: Secondary | ICD-10-CM | POA: Diagnosis not present

## 2021-12-31 DIAGNOSIS — E1122 Type 2 diabetes mellitus with diabetic chronic kidney disease: Secondary | ICD-10-CM | POA: Diagnosis not present

## 2021-12-31 DIAGNOSIS — D631 Anemia in chronic kidney disease: Secondary | ICD-10-CM | POA: Diagnosis not present

## 2021-12-31 DIAGNOSIS — D509 Iron deficiency anemia, unspecified: Secondary | ICD-10-CM | POA: Diagnosis not present

## 2021-12-31 DIAGNOSIS — Z992 Dependence on renal dialysis: Secondary | ICD-10-CM | POA: Diagnosis not present

## 2021-12-31 DIAGNOSIS — N2581 Secondary hyperparathyroidism of renal origin: Secondary | ICD-10-CM | POA: Diagnosis not present

## 2021-12-31 DIAGNOSIS — I10 Essential (primary) hypertension: Secondary | ICD-10-CM | POA: Diagnosis not present

## 2022-01-02 ENCOUNTER — Emergency Department (HOSPITAL_COMMUNITY): Payer: Medicare Other

## 2022-01-02 ENCOUNTER — Encounter (HOSPITAL_COMMUNITY): Payer: Self-pay | Admitting: Emergency Medicine

## 2022-01-02 ENCOUNTER — Other Ambulatory Visit: Payer: Self-pay

## 2022-01-02 ENCOUNTER — Inpatient Hospital Stay (HOSPITAL_COMMUNITY)
Admission: EM | Admit: 2022-01-02 | Discharge: 2022-01-04 | DRG: 304 | Disposition: A | Discharge status: Home / Self Care | Payer: Medicare Other | Attending: Internal Medicine | Admitting: Internal Medicine

## 2022-01-02 DIAGNOSIS — Z79899 Other long term (current) drug therapy: Secondary | ICD-10-CM | POA: Diagnosis not present

## 2022-01-02 DIAGNOSIS — E871 Hypo-osmolality and hyponatremia: Secondary | ICD-10-CM | POA: Diagnosis present

## 2022-01-02 DIAGNOSIS — Z833 Family history of diabetes mellitus: Secondary | ICD-10-CM | POA: Diagnosis not present

## 2022-01-02 DIAGNOSIS — N186 End stage renal disease: Secondary | ICD-10-CM | POA: Diagnosis present

## 2022-01-02 DIAGNOSIS — I120 Hypertensive chronic kidney disease with stage 5 chronic kidney disease or end stage renal disease: Secondary | ICD-10-CM | POA: Diagnosis present

## 2022-01-02 DIAGNOSIS — I160 Hypertensive urgency: Principal | ICD-10-CM | POA: Diagnosis present

## 2022-01-02 DIAGNOSIS — E118 Type 2 diabetes mellitus with unspecified complications: Secondary | ICD-10-CM | POA: Diagnosis present

## 2022-01-02 DIAGNOSIS — E1122 Type 2 diabetes mellitus with diabetic chronic kidney disease: Secondary | ICD-10-CM | POA: Diagnosis present

## 2022-01-02 DIAGNOSIS — E785 Hyperlipidemia, unspecified: Secondary | ICD-10-CM | POA: Diagnosis not present

## 2022-01-02 DIAGNOSIS — Z992 Dependence on renal dialysis: Secondary | ICD-10-CM | POA: Diagnosis not present

## 2022-01-02 DIAGNOSIS — Z7984 Long term (current) use of oral hypoglycemic drugs: Secondary | ICD-10-CM | POA: Diagnosis not present

## 2022-01-02 DIAGNOSIS — D509 Iron deficiency anemia, unspecified: Secondary | ICD-10-CM | POA: Diagnosis not present

## 2022-01-02 DIAGNOSIS — I2510 Atherosclerotic heart disease of native coronary artery without angina pectoris: Secondary | ICD-10-CM | POA: Diagnosis not present

## 2022-01-02 DIAGNOSIS — E119 Type 2 diabetes mellitus without complications: Secondary | ICD-10-CM | POA: Diagnosis present

## 2022-01-02 DIAGNOSIS — Z8249 Family history of ischemic heart disease and other diseases of the circulatory system: Secondary | ICD-10-CM

## 2022-01-02 DIAGNOSIS — N2581 Secondary hyperparathyroidism of renal origin: Secondary | ICD-10-CM | POA: Diagnosis not present

## 2022-01-02 DIAGNOSIS — I10 Essential (primary) hypertension: Secondary | ICD-10-CM | POA: Diagnosis not present

## 2022-01-02 DIAGNOSIS — E875 Hyperkalemia: Secondary | ICD-10-CM | POA: Diagnosis present

## 2022-01-02 DIAGNOSIS — I248 Other forms of acute ischemic heart disease: Secondary | ICD-10-CM | POA: Diagnosis present

## 2022-01-02 DIAGNOSIS — R519 Headache, unspecified: Secondary | ICD-10-CM | POA: Diagnosis present

## 2022-01-02 DIAGNOSIS — R079 Chest pain, unspecified: Secondary | ICD-10-CM

## 2022-01-02 DIAGNOSIS — D631 Anemia in chronic kidney disease: Secondary | ICD-10-CM | POA: Diagnosis present

## 2022-01-02 DIAGNOSIS — R110 Nausea: Secondary | ICD-10-CM | POA: Diagnosis not present

## 2022-01-02 DIAGNOSIS — R197 Diarrhea, unspecified: Secondary | ICD-10-CM | POA: Diagnosis not present

## 2022-01-02 DIAGNOSIS — F419 Anxiety disorder, unspecified: Secondary | ICD-10-CM | POA: Diagnosis present

## 2022-01-02 DIAGNOSIS — R0789 Other chest pain: Secondary | ICD-10-CM | POA: Diagnosis not present

## 2022-01-02 LAB — COMPREHENSIVE METABOLIC PANEL
ALT: 14 U/L (ref 0–44)
AST: 20 U/L (ref 15–41)
Albumin: 4.3 g/dL (ref 3.5–5.0)
Alkaline Phosphatase: 69 U/L (ref 38–126)
Anion gap: 12 (ref 5–15)
BUN: 28 mg/dL — ABNORMAL HIGH (ref 8–23)
CO2: 27 mmol/L (ref 22–32)
Calcium: 8.8 mg/dL — ABNORMAL LOW (ref 8.9–10.3)
Chloride: 96 mmol/L — ABNORMAL LOW (ref 98–111)
Creatinine, Ser: 4.83 mg/dL — ABNORMAL HIGH (ref 0.44–1.00)
GFR, Estimated: 9 mL/min — ABNORMAL LOW (ref >60)
Glucose, Bld: 94 mg/dL (ref 70–99)
Potassium: 4.9 mmol/L (ref 3.5–5.1)
Sodium: 135 mmol/L (ref 135–145)
Total Bilirubin: 0.9 mg/dL (ref 0.3–1.2)
Total Protein: 8 g/dL (ref 6.5–8.1)

## 2022-01-02 LAB — CBC WITH DIFFERENTIAL/PLATELET
Abs Immature Granulocytes: 0.02 10*3/uL (ref 0.00–0.07)
Basophils Absolute: 0.1 10*3/uL (ref 0.0–0.1)
Basophils Relative: 1 %
Eosinophils Absolute: 0.1 10*3/uL (ref 0.0–0.5)
Eosinophils Relative: 2 %
HCT: 48.2 % — ABNORMAL HIGH (ref 36.0–46.0)
Hemoglobin: 15.2 g/dL — ABNORMAL HIGH (ref 12.0–15.0)
Immature Granulocytes: 0 %
Lymphocytes Relative: 13 %
Lymphs Abs: 1.1 10*3/uL (ref 0.7–4.0)
MCH: 27.3 pg (ref 26.0–34.0)
MCHC: 31.5 g/dL (ref 30.0–36.0)
MCV: 86.7 fL (ref 80.0–100.0)
Monocytes Absolute: 0.7 10*3/uL (ref 0.1–1.0)
Monocytes Relative: 9 %
Neutro Abs: 6.1 10*3/uL (ref 1.7–7.7)
Neutrophils Relative %: 75 %
Platelets: 150 10*3/uL (ref 150–400)
RBC: 5.56 MIL/uL — ABNORMAL HIGH (ref 3.87–5.11)
RDW: 17.6 % — ABNORMAL HIGH (ref 11.5–15.5)
WBC: 8 10*3/uL (ref 4.0–10.5)
nRBC: 0 % (ref 0.0–0.2)

## 2022-01-02 LAB — BRAIN NATRIURETIC PEPTIDE: B Natriuretic Peptide: 2185.4 pg/mL — ABNORMAL HIGH (ref 0.0–100.0)

## 2022-01-02 LAB — TROPONIN I (HIGH SENSITIVITY): Troponin I (High Sensitivity): 19 ng/L — ABNORMAL HIGH (ref <18)

## 2022-01-02 MED ORDER — KETOROLAC TROMETHAMINE 15 MG/ML IJ SOLN
15.0000 mg | Freq: Once | INTRAMUSCULAR | Status: AC
  Administered 2022-01-02: 15 mg via INTRAVENOUS
  Filled 2022-01-02: qty 1

## 2022-01-02 MED ORDER — HYDRALAZINE HCL 20 MG/ML IJ SOLN
10.0000 mg | Freq: Once | INTRAMUSCULAR | Status: AC
  Administered 2022-01-02: 10 mg via INTRAVENOUS
  Filled 2022-01-02: qty 1

## 2022-01-02 MED ORDER — LABETALOL HCL 5 MG/ML IV SOLN
20.0000 mg | Freq: Once | INTRAVENOUS | Status: AC
  Administered 2022-01-02: 20 mg via INTRAVENOUS

## 2022-01-02 MED ORDER — FENTANYL CITRATE PF 50 MCG/ML IJ SOSY
50.0000 ug | PREFILLED_SYRINGE | Freq: Once | INTRAMUSCULAR | Status: AC
  Administered 2022-01-02: 50 ug via INTRAVENOUS
  Filled 2022-01-02: qty 1

## 2022-01-02 MED ORDER — ACETAMINOPHEN 500 MG PO TABS
1000.0000 mg | ORAL_TABLET | Freq: Once | ORAL | Status: DC
Start: 2022-01-02 — End: 2022-01-03

## 2022-01-02 MED ORDER — ONDANSETRON 4 MG PO TBDP: 4.0000 mg | ORAL_TABLET | Freq: Once | ORAL | Status: DC

## 2022-01-02 MED ORDER — ONDANSETRON HCL 4 MG/2ML IJ SOLN
4.0000 mg | Freq: Once | INTRAMUSCULAR | Status: AC
  Administered 2022-01-02: 4 mg via INTRAVENOUS

## 2022-01-02 MED ORDER — LABETALOL HCL 200 MG PO TABS: 400.0000 mg | ORAL_TABLET | Freq: Two times a day (BID) | ORAL | Status: DC

## 2022-01-02 NOTE — ED Notes (Signed)
Attempted IV x 2 with no success. EDP notified.

## 2022-01-02 NOTE — ED Triage Notes (Signed)
Severe headache with neck pain and hypertension  Dialysis today and was sent d/t BP of 250. No blurry vision  Nausea no vomiting.

## 2022-01-02 NOTE — ED Provider Triage Note (Signed)
Emergency Medicine Provider Triage Evaluation Note  Tymeshia Awan , a 66 y.o. female  was evaluated in triage.  Pt complains of severe headache and neck pain with high blood pressure.  Patient states that since her last dialysis treatment she has had very high blood pressure that has not come down.  She is endorsing nausea without vomiting.  She is having global headache without changes to her vision, numbness or tingling to the face arms or legs or weakness..  Review of Systems  Positive:  Negative:   Physical Exam  BP (!) 223/81 (BP Location: Right Arm)   Pulse 70   Temp 98.7 F (37.1 C)   Resp 20   SpO2 98%  Gen:   Awake, alert and oriented, in significant pain holding her head Resp:  Normal effort  MSK:   Moves extremities without difficulty  Other:  Nonfocal neuro exam  Medical Decision Making  Medically screening exam initiated at 6:21 PM.  Appropriate orders placed.  [name] was informed that the remainder of the evaluation will be completed by another provider, this initial triage assessment does not replace that evaluation, and the importance of remaining in the ED until their evaluation is complete.     Mickie Hillier, PA-C 01/02/22 Vernelle Emerald

## 2022-01-02 NOTE — ED Notes (Signed)
IV team at bedside 

## 2022-01-02 NOTE — ED Provider Notes (Signed)
[facility] Provider Note   CSN: [id number] Arrival date & time: 01/02/22  1723     History {Add pertinent medical, surgical, social history, OB history to HPI:1} Chief Complaint  Patient presents with   Hypertension    [facility] is a 66 y.o. female. With pmh HTN, ESRD on HD last HD today 01/02/22, HLD presenting with headache, chest pain and HTN.    Hypertension       Home Medications Prior to Admission medications   Medication Sig Start Date End Date Taking? Authorizing Provider  acetaminophen (TYLENOL) 325 MG tablet Take 2 tablets (650 mg total) by mouth every 6 (six) hours as needed for mild pain or moderate pain. 09/05/21   Carlisle Cater, PA-C  amLODipine (NORVASC) 10 MG tablet Take 10 mg by mouth daily.    [provider]  atorvastatin (LIPITOR) 20 MG tablet Take 1 tablet (20 mg total) by mouth daily. 01/01/20   Charlynne Cousins, MD  calcium acetate (PHOSLO) 667 MG capsule Take 667 mg by mouth 3 (three) times daily. 04/02/21   [provider]  diclofenac Sodium (VOLTAREN) 1 % GEL Apply 4 g topically 4 (four) times daily. 06/18/20   Just, Laurita Quint, FNP  escitalopram (LEXAPRO) 10 MG tablet Take 1 tablet (10 mg total) by mouth daily. Patient not taking: Reported on 09/24/2021 07/23/20 10/21/20  Horald Pollen, MD  gabapentin (NEURONTIN) 100 MG capsule Take 100 mg by mouth at bedtime. 09/03/21   [provider]  hydrALAZINE (APRESOLINE) 25 MG tablet Take 1 tablet (25 mg total) by mouth 3 (three) times daily. 09/23/21   Elgergawy, Silver Huguenin, MD  labetalol (NORMODYNE) 200 MG tablet Take 2 tablets (400 mg total) by mouth 2 (two) times daily. 09/23/21   Elgergawy, Silver Huguenin, MD  linagliptin (TRADJENTA) 5 MG TABS tablet Take 5 mg by mouth daily.    [provider]  losartan (COZAAR) 100 MG tablet Take 100 mg by mouth daily. 10/08/21   [provider]  olopatadine (PATANOL) 0.1 % ophthalmic  solution Place 1 drop into both eyes 2 (two) times daily as needed for allergies.    [provider]  ondansetron (ZOFRAN-ODT) 4 MG disintegrating tablet Take 1 tablet (4 mg total) by mouth every 8 (eight) hours as needed for nausea or vomiting. 09/05/21   Carlisle Cater, PA-C  pantoprazole (PROTONIX) 40 MG tablet Take 1 tablet (40 mg total) by mouth daily. 09/23/21   Elgergawy, Silver Huguenin, MD  sitaGLIPtin (JANUVIA) 25 MG tablet Take 1 tablet (25 mg total) by mouth daily. Patient not taking: Reported on 09/24/2021 07/23/20 10/21/20  Horald Pollen, MD      Allergies    Patient has no known allergies.    Review of Systems   Review of Systems  Physical Exam Updated Vital Signs BP (!) 223/81 (BP Location: Right Arm)   Pulse 70   Temp 98.7 F (37.1 C)   Resp 20   SpO2 98%  Physical Exam  ED Results / Procedures / Treatments   Labs (all labs ordered are listed, but only abnormal results are displayed) Labs Reviewed  CBC WITH DIFFERENTIAL/PLATELET  URINALYSIS, ROUTINE W REFLEX MICROSCOPIC  COMPREHENSIVE METABOLIC PANEL  BRAIN NATRIURETIC PEPTIDE  TROPONIN I (HIGH SENSITIVITY)    EKG None  Radiology No results found.  Procedures Procedures  {Document cardiac monitor, telemetry assessment procedure when appropriate:1}  Medications Ordered in ED Medications  ondansetron (ZOFRAN-ODT) disintegrating tablet 4 mg (  has no administration in time range)    ED Course/ Medical Decision Making/ A&P Clinical Course as of 01/02/22 2026  Fri Jan 02, 2022  2025 Troponin I (High Sensitivity)(!): 19 [VB]  2026 B Natriuretic Peptide(!): 2,185.4 [VB]    Clinical Course User Index [VB] Elgie Congo, MD                           Medical Decision Making  [facility] is a 67 y.o. female. With pmh HTN, ESRD on HD, HLD presenting with headache, chest pain and HTN.   Patient had headache that began at 8 AM this morning in dialysis that has progressively  worsened.  It was not Sudden and severe in onset with no associated focal neurologic deficits but due to her hypertension and severe pain, will pursue imaging with CTA head and neck to further evaluate for ICH, aneurysm or other abnormality.  She denies any recent injuries or falls and is not on anticoagulation.  If all negative, also consider just possible primary headache such as tension or migraine.  Additionally, patient is hypertensive blood pressure 223/81 with chest pain.  Concern for possible hypertensive emergency, no pulmonary edema or flash pulmonary edema or hypoxia or shortness of breath but with the chest pain, will pursue EKG and troponins to further evaluate for ACS and CTA dissection scan to further evaluate for possible dissection.     Amount and/or Complexity of Data Reviewed Labs: ordered. Decision-making details documented in ED Course. Radiology: ordered.  Risk Prescription drug management.   ***  {Document critical care time when appropriate:1} {Document review of labs and clinical decision tools ie heart score, Chads2Vasc2 etc:1}  {Document your independent review of radiology images, and any outside records:1} {Document your discussion with family members, caretakers, and with consultants:1} {Document social determinants of health affecting pt's care:1} {Document your decision making why or why not admission, treatments were needed:1} Final Clinical Impression(s) / ED Diagnoses Final diagnoses:  None    Rx / DC Orders ED Discharge Orders     None

## 2022-01-02 NOTE — ED Notes (Signed)
IV infiltrated in CT. EDP notified. IV team still has not arrived for IV access - called, no answer.

## 2022-01-03 ENCOUNTER — Encounter (HOSPITAL_COMMUNITY): Payer: Self-pay | Admitting: Internal Medicine

## 2022-01-03 DIAGNOSIS — I2510 Atherosclerotic heart disease of native coronary artery without angina pectoris: Secondary | ICD-10-CM | POA: Diagnosis not present

## 2022-01-03 DIAGNOSIS — N250 Renal osteodystrophy: Secondary | ICD-10-CM | POA: Diagnosis not present

## 2022-01-03 DIAGNOSIS — F419 Anxiety disorder, unspecified: Secondary | ICD-10-CM | POA: Diagnosis present

## 2022-01-03 DIAGNOSIS — Z7984 Long term (current) use of oral hypoglycemic drugs: Secondary | ICD-10-CM | POA: Diagnosis not present

## 2022-01-03 DIAGNOSIS — D631 Anemia in chronic kidney disease: Secondary | ICD-10-CM | POA: Diagnosis not present

## 2022-01-03 DIAGNOSIS — R519 Headache, unspecified: Secondary | ICD-10-CM | POA: Diagnosis not present

## 2022-01-03 DIAGNOSIS — I248 Other forms of acute ischemic heart disease: Secondary | ICD-10-CM | POA: Diagnosis not present

## 2022-01-03 DIAGNOSIS — I160 Hypertensive urgency: Secondary | ICD-10-CM | POA: Diagnosis not present

## 2022-01-03 DIAGNOSIS — Z833 Family history of diabetes mellitus: Secondary | ICD-10-CM | POA: Diagnosis not present

## 2022-01-03 DIAGNOSIS — E875 Hyperkalemia: Secondary | ICD-10-CM | POA: Diagnosis not present

## 2022-01-03 DIAGNOSIS — R079 Chest pain, unspecified: Secondary | ICD-10-CM | POA: Insufficient documentation

## 2022-01-03 DIAGNOSIS — Z8249 Family history of ischemic heart disease and other diseases of the circulatory system: Secondary | ICD-10-CM | POA: Diagnosis not present

## 2022-01-03 DIAGNOSIS — I120 Hypertensive chronic kidney disease with stage 5 chronic kidney disease or end stage renal disease: Secondary | ICD-10-CM | POA: Diagnosis not present

## 2022-01-03 DIAGNOSIS — N186 End stage renal disease: Secondary | ICD-10-CM | POA: Diagnosis not present

## 2022-01-03 DIAGNOSIS — E871 Hypo-osmolality and hyponatremia: Secondary | ICD-10-CM | POA: Diagnosis not present

## 2022-01-03 DIAGNOSIS — E785 Hyperlipidemia, unspecified: Secondary | ICD-10-CM | POA: Diagnosis not present

## 2022-01-03 DIAGNOSIS — E1122 Type 2 diabetes mellitus with diabetic chronic kidney disease: Secondary | ICD-10-CM | POA: Diagnosis not present

## 2022-01-03 DIAGNOSIS — Z79899 Other long term (current) drug therapy: Secondary | ICD-10-CM | POA: Diagnosis not present

## 2022-01-03 DIAGNOSIS — E118 Type 2 diabetes mellitus with unspecified complications: Secondary | ICD-10-CM | POA: Diagnosis not present

## 2022-01-03 DIAGNOSIS — N2581 Secondary hyperparathyroidism of renal origin: Secondary | ICD-10-CM | POA: Diagnosis not present

## 2022-01-03 DIAGNOSIS — Z992 Dependence on renal dialysis: Secondary | ICD-10-CM | POA: Diagnosis not present

## 2022-01-03 LAB — COMPREHENSIVE METABOLIC PANEL
ALT: 13 U/L (ref 0–44)
AST: 16 U/L (ref 15–41)
Albumin: 3.6 g/dL (ref 3.5–5.0)
Alkaline Phosphatase: 56 U/L (ref 38–126)
Anion gap: 11 (ref 5–15)
BUN: 35 mg/dL — ABNORMAL HIGH (ref 8–23)
CO2: 27 mmol/L (ref 22–32)
Calcium: 8.6 mg/dL — ABNORMAL LOW (ref 8.9–10.3)
Chloride: 96 mmol/L — ABNORMAL LOW (ref 98–111)
Creatinine, Ser: 5.76 mg/dL — ABNORMAL HIGH (ref 0.44–1.00)
GFR, Estimated: 8 mL/min — ABNORMAL LOW (ref >60)
Glucose, Bld: 94 mg/dL (ref 70–99)
Potassium: 5.4 mmol/L — ABNORMAL HIGH (ref 3.5–5.1)
Sodium: 134 mmol/L — ABNORMAL LOW (ref 135–145)
Total Bilirubin: 0.8 mg/dL (ref 0.3–1.2)
Total Protein: 6.7 g/dL (ref 6.5–8.1)

## 2022-01-03 LAB — GLUCOSE, CAPILLARY
Glucose-Capillary: 108 mg/dL — ABNORMAL HIGH (ref 70–99)
Glucose-Capillary: 117 mg/dL — ABNORMAL HIGH (ref 70–99)
Glucose-Capillary: 211 mg/dL — ABNORMAL HIGH (ref 70–99)

## 2022-01-03 LAB — CBC
HCT: 43.4 % (ref 36.0–46.0)
Hemoglobin: 13.8 g/dL (ref 12.0–15.0)
MCH: 27.6 pg (ref 26.0–34.0)
MCHC: 31.8 g/dL (ref 30.0–36.0)
MCV: 86.8 fL (ref 80.0–100.0)
Platelets: 150 10*3/uL (ref 150–400)
RBC: 5 MIL/uL (ref 3.87–5.11)
RDW: 17.8 % — ABNORMAL HIGH (ref 11.5–15.5)
WBC: 9 10*3/uL (ref 4.0–10.5)
nRBC: 0 % (ref 0.0–0.2)

## 2022-01-03 LAB — HEPATITIS C ANTIBODY: HCV Ab: NONREACTIVE

## 2022-01-03 LAB — HEPATITIS B CORE ANTIBODY, TOTAL: Hep B Core Total Ab: NONREACTIVE

## 2022-01-03 LAB — HEPATITIS B SURFACE ANTIGEN: Hepatitis B Surface Ag: NONREACTIVE

## 2022-01-03 LAB — TROPONIN I (HIGH SENSITIVITY): Troponin I (High Sensitivity): 16 ng/L (ref <18)

## 2022-01-03 LAB — HEPATITIS B SURFACE ANTIBODY,QUALITATIVE: Hep B S Ab: REACTIVE — AB

## 2022-01-03 LAB — MRSA NEXT GEN BY PCR, NASAL: MRSA by PCR Next Gen: NOT DETECTED

## 2022-01-03 MED ORDER — ISOSORB DINITRATE-HYDRALAZINE 20-37.5 MG PO TABS
1.0000 | ORAL_TABLET | Freq: Three times a day (TID) | ORAL | Status: DC
Start: 2022-01-03 — End: 2022-01-04
  Administered 2022-01-03 – 2022-01-04 (×3): 1 via ORAL
  Filled 2022-01-03 (×2): qty 1

## 2022-01-03 MED ORDER — ACETAMINOPHEN 325 MG PO TABS
650.0000 mg | ORAL_TABLET | Freq: Four times a day (QID) | ORAL | Status: DC | PRN
  Administered 2022-01-03 (×2): 650 mg via ORAL
  Filled 2022-01-03 (×3): qty 2

## 2022-01-03 MED ORDER — BISACODYL 5 MG PO TBEC
5.0000 mg | DELAYED_RELEASE_TABLET | Freq: Every day | ORAL | Status: DC | PRN
  Administered 2022-01-03: 5 mg via ORAL
  Filled 2022-01-03: qty 1

## 2022-01-03 MED ORDER — LOSARTAN POTASSIUM 50 MG PO TABS
100.0000 mg | ORAL_TABLET | Freq: Every day | ORAL | Status: DC
  Administered 2022-01-03 – 2022-01-04 (×2): 100 mg via ORAL
  Filled 2022-01-03 (×2): qty 2

## 2022-01-03 MED ORDER — CHLORHEXIDINE GLUCONATE CLOTH 2 % EX PADS
6.0000 | MEDICATED_PAD | Freq: Every day | CUTANEOUS | Status: DC
  Administered 2022-01-03: 6 via TOPICAL

## 2022-01-03 MED ORDER — AMLODIPINE BESYLATE 10 MG PO TABS
10.0000 mg | ORAL_TABLET | Freq: Every day | ORAL | Status: DC
  Administered 2022-01-03 (×2): 10 mg via ORAL
  Filled 2022-01-03 (×2): qty 1

## 2022-01-03 MED ORDER — LABETALOL HCL 5 MG/ML IV SOLN
10.0000 mg | INTRAVENOUS | Status: DC | PRN
  Administered 2022-01-03: 10 mg via INTRAVENOUS

## 2022-01-03 MED ORDER — GABAPENTIN 100 MG PO CAPS
100.0000 mg | ORAL_CAPSULE | Freq: Every day | ORAL | Status: DC
  Administered 2022-01-03 (×2): 100 mg via ORAL
  Filled 2022-01-03 (×2): qty 1

## 2022-01-03 MED ORDER — POLYETHYLENE GLYCOL 3350 17 G PO PACK
17.0000 g | PACK | Freq: Every day | ORAL | Status: DC
  Administered 2022-01-04: 17 g via ORAL
  Filled 2022-01-03: qty 1

## 2022-01-03 MED ORDER — ACETAMINOPHEN 650 MG RE SUPP: 650.0000 mg | Freq: Four times a day (QID) | RECTAL | Status: DC | PRN

## 2022-01-03 MED ORDER — INSULIN ASPART 100 UNIT/ML IJ SOLN: 0.0000 [IU] | Freq: Three times a day (TID) | INTRAMUSCULAR | Status: DC

## 2022-01-03 MED ORDER — LABETALOL HCL 200 MG PO TABS
400.0000 mg | ORAL_TABLET | Freq: Two times a day (BID) | ORAL | Status: DC
  Administered 2022-01-03 – 2022-01-04 (×4): 400 mg via ORAL
  Filled 2022-01-03 (×4): qty 2

## 2022-01-03 MED ORDER — OFLOXACIN 0.3 % OP SOLN: 1.0000 [drp] | Freq: Four times a day (QID) | OPHTHALMIC | Status: DC

## 2022-01-03 NOTE — Progress Notes (Signed)
Patient admitted to unit with family member, both mostly Spanish speaking and needing interpreter. Oriented patient to room and unit, especially explaining calling for help. BP in 180s/70s. Leg pain 8 out of 10 and headache 5 out of 10. Explained she has had the leg pain for years and takes a medication at night to help. Asked family and was told she takes 100mg  of gabapentin at night. Paged physician. New orders for gabapentin and PRN labetalol. Scheduled PO labetalol and amlodipine given along with PRN tylenol. Patient and family member now resting. Will continue to monitor.

## 2022-01-03 NOTE — Progress Notes (Signed)
[facility]  XFG:[id number] DOB: Sep 30, 1955 DOA: 01/02/2022 PCP: Trey Sailors, PA    Brief Narrative:  66 year old with a history of ESRD on HD MWF, HTN, and DM2 who was brought to the ER with complaints of occipital headache and substernal nonradiating chest pain.  In the ER the patient was found to have a systolic blood pressure of 240.  CT head was unrevealing.  Consultants:  Nephrology  Goals of Care:  Code Status: Full Code   DVT prophylaxis: SCDs  Interim Hx: Patient was examined and interviewed by one of my partners earlier today.  Blood pressure has improved since admission.  Vital signs are otherwise stable.  Assessment & Plan:  Hypertensive urgency Severe uncontrolled systolic blood pressure leading to chest pain and headache -continue usual amlodipine and losartan -labetalol added  Chest pain Due to hypertensive urgency -cardiac markers negative -CT chest without evidence of coronary artery calcification  Occipital headache Due to hypertensive urgency -no focal neurologic deficits  ESRD on HD Nephrology to follow  DM2 CBG well controlled  Family Communication:  Disposition:     Objective: Blood pressure (!) 155/62, pulse 67, temperature 98.9 F (37.2 C), temperature source Oral, resp. rate 20, height (S) 5' (1.524 m), weight 63.4 kg, SpO2 97 %.  Intake/Output Summary (Last 24 hours) at 01/03/2022 0740 Last data filed at 01/03/2022 0212 Gross per 24 hour  Intake --  Output 0 ml  Net 0 ml   Filed Weights   01/03/22 0616  Weight: 63.4 kg    Examination: Patient was examined by one of my partners earlier today.  CBC: Recent Labs  Lab 01/02/22 1828 01/03/22 0536  WBC 8.0 9.0  NEUTROABS 6.1  --   HGB 15.2* 13.8  HCT 48.2* 43.4  MCV 86.7 86.8  PLT 150 967   Basic Metabolic Panel: Recent Labs  Lab 01/02/22 1828 01/03/22 0536  NA 135 134*  K 4.9 5.4*  CL 96* 96*  CO2 27 27  GLUCOSE 94 94  BUN 28* 35*  CREATININE 4.83*  5.76*  CALCIUM 8.8* 8.6*   GFR: Estimated Creatinine Clearance: 8 mL/min (A) (by C-G formula based on SCr of 5.76 mg/dL (H)).  Liver Function Tests: Recent Labs  Lab 01/02/22 1828 01/03/22 0536  AST 20 16  ALT 14 13  ALKPHOS 69 56  BILITOT 0.9 0.8  PROT 8.0 6.7  ALBUMIN 4.3 3.6    HbA1C: Hgb A1c MFr Bld  Date/Time Value Ref Range Status  09/21/2021 03:40 PM 4.5 (L) 4.8 - 5.6 % Final    Comment:    (NOTE) Pre diabetes:          5.7%-6.4%  Diabetes:              >6.4%  Glycemic control for   <7.0% adults with diabetes   12/29/2019 09:33 AM 5.5 4.8 - 5.6 % Final    Comment:    (NOTE) Pre diabetes:          5.7%-6.4%  Diabetes:              >6.4%  Glycemic control for   <7.0% adults with diabetes     CBG: Recent Labs  Lab 01/03/22 0204  GLUCAP 108*    Scheduled Meds:  acetaminophen  1,000 mg Oral Once   amLODipine  10 mg Oral QHS   gabapentin  100 mg Oral QHS   insulin aspart  0-6 Units Subcutaneous TID WC   labetalol  400 mg  Oral BID   losartan  100 mg Oral Daily     LOS: 0 days   Cherene Altes, MD [facility] Office  [phone] Pager - Text Page per Amion  If 7PM-7AM, please contact night-coverage per Amion 01/03/2022, 7:40 AM

## 2022-01-03 NOTE — Progress Notes (Signed)
Received patient in bed to unit.  Alert and oriented.  Informed consent signed and in  chart.   Treatment initiated: 7357 Treatment completed: 1803  Patient tolerated well.  Transported back to the room  alert, without acute distress.  Hand-off given to patient's nurse.   Access used: L AVF Access issues: none  Total UF removed: 1.9L Medication(s) given: Tylenol Post HD VS: 195/66 HR 66 99% 98.3 Post HD weight: 63.8kg   [facility]

## 2022-01-03 NOTE — H&P (Signed)
History and Physical    [facility] QAS:[id number] DOB: 1955/10/13 DOA: 01/02/2022  PCP: Trey Sailors, PA  Patient coming from: Home.  Patent attorney used.  Chief Complaint: Chest pain and headache.  HPI: [facility] is a 66 y.o. female with history of ESRD on hemodialysis on Monday Wednesday Friday, hypertension, diabetes mellitus type 2 was brought to the ER after patient has been having headache and chest pain.  Is not sure if she completed her dialysis session.  Patient's headache started yesterday morning after waking up pain is mostly in the occipital area radiating into her neck.  Later in the evening after dialysis patient also developed chest pain substernal nonradiating.  No associated nausea vomiting visual symptoms or any focal deficits.  Per patient's daughter who provided the history patient's blood pressure medication labetalol was discontinued 2 weeks ago and patient was started on Coreg.  ED Course: In the ER patient blood pressure more than 798 systolic which improved with IV hydralazine and labetalol but was going back again.  CT head and CT chest without contrast was unremarkable.  EKG showed normal sinus rhythm troponins were negative.  Patient admitted for hypertensive urgency.  Review of Systems: As per HPI, rest all negative.   Past Medical History:  Diagnosis Date   Anemia    Anxiety    Arthritis    Diabetes mellitus without complication (HCC)    type 2   Heart murmur    echo 07/02/20: Mild MR/TR, mild-moderate AV sclerosis, bicuspid or functional bicuspid AV, no evidence of AS. Murmr felt due to AV.   History of blood transfusion    Hyperlipidemia    Hypertension    Pneumonia    PONV (postoperative nausea and vomiting)    Renal disorder    M-W-F    Past Surgical History:  Procedure Laterality Date   AV FISTULA PLACEMENT Left 05/20/2020   Procedure: INSERTION OF LEFT ARM ARTERIOVENOUS (AV) FISTULA;  Surgeon: Marty Heck, MD;  Location: [facility];  Service: Vascular;  Laterality: Left;   [place] Left 08/05/2020   Procedure: SECOND STAGE BASILIC VEIN TRANSPOSITION;  Surgeon: Marty Heck, MD;  Location: [facility];  Service: Vascular;  Laterality: Left;   BIOPSY  09/22/2021   Procedure: BIOPSY;  Surgeon: Ladene Artist, MD;  Location: [facility] ENDOSCOPY;  Service: Gastroenterology;;   CESAREAN SECTION     CHOLECYSTECTOMY     ESOPHAGOGASTRODUODENOSCOPY (EGD) WITH PROPOFOL N/A 09/22/2021   Procedure: ESOPHAGOGASTRODUODENOSCOPY (EGD) WITH PROPOFOL;  Surgeon: Ladene Artist, MD;  Location[facility] ENDOSCOPY;  Service: Gastroenterology;  Laterality: N/A;   INSERTION OF DIALYSIS CATHETER N/A 05/20/2020   Procedure: INSERTION OF DIALYSIS CATHETER;  Surgeon: Marty Heck, MD;  Location: [place];  Service: Vascular;  Laterality: N/A;   VIDEO BRONCHOSCOPY Bilateral 09/26/2021   Procedure: VIDEO BRONCHOSCOPY WITHOUT FLUORO;  Surgeon: Candee Furbish, MD;  Location[facility] ENDOSCOPY;  Service: Pulmonary;  Laterality: Bilateral;     reports that she has never smoked. She has never used smokeless tobacco. She reports that she does not drink alcohol and does not use drugs.  No Known Allergies  Family History  Problem Relation Age of Onset   Diabetes Mother    Hypertension Father    Diabetes Sister    Diabetes Brother    Diabetes Brother     Prior to Admission medications   Medication Sig Start Date End Date Taking? Authorizing Provider  acetaminophen (TYLENOL) 325 MG  tablet Take 2 tablets (650 mg total) by mouth every 6 (six) hours as needed for mild pain or moderate pain. 09/05/21   Carlisle Cater, PA-C  amLODipine (NORVASC) 10 MG tablet Take 10 mg by mouth daily.    [provider]  atorvastatin (LIPITOR) 20 MG tablet Take 1 tablet (20 mg total) by mouth daily. 01/01/20   Charlynne Cousins, MD  calcium acetate (PHOSLO) 667 MG capsule Take 667 mg by mouth 3 (three) times daily.  04/02/21   [provider]  diclofenac Sodium (VOLTAREN) 1 % GEL Apply 4 g topically 4 (four) times daily. 06/18/20   Just, Laurita Quint, FNP  escitalopram (LEXAPRO) 10 MG tablet Take 1 tablet (10 mg total) by mouth daily. Patient not taking: Reported on 09/24/2021 07/23/20 10/21/20  Horald Pollen, MD  gabapentin (NEURONTIN) 100 MG capsule Take 100 mg by mouth at bedtime. 09/03/21   [provider]  hydrALAZINE (APRESOLINE) 25 MG tablet Take 1 tablet (25 mg total) by mouth 3 (three) times daily. 09/23/21   Elgergawy, Silver Huguenin, MD  labetalol (NORMODYNE) 200 MG tablet Take 2 tablets (400 mg total) by mouth 2 (two) times daily. 09/23/21   Elgergawy, Silver Huguenin, MD  linagliptin (TRADJENTA) 5 MG TABS tablet Take 5 mg by mouth daily.    [provider]  losartan (COZAAR) 100 MG tablet Take 100 mg by mouth daily. 10/08/21   [provider]  olopatadine (PATANOL) 0.1 % ophthalmic solution Place 1 drop into both eyes 2 (two) times daily as needed for allergies.    [provider]  ondansetron (ZOFRAN-ODT) 4 MG disintegrating tablet Take 1 tablet (4 mg total) by mouth every 8 (eight) hours as needed for nausea or vomiting. 09/05/21   Carlisle Cater, PA-C  pantoprazole (PROTONIX) 40 MG tablet Take 1 tablet (40 mg total) by mouth daily. 09/23/21   Elgergawy, Silver Huguenin, MD  sitaGLIPtin (JANUVIA) 25 MG tablet Take 1 tablet (25 mg total) by mouth daily. Patient not taking: Reported on 09/24/2021 07/23/20 10/21/20  Horald Pollen, MD    Physical Exam: Constitutional: Moderately built and nourished. Vitals:   01/02/22 2300 01/02/22 2304 01/02/22 2315 01/02/22 2330  BP: (!) 161/141  (!) 165/144 (!) 197/109  Pulse: 76  73 74  Resp: 20  14 (!) 25  Temp:  97.8 F (36.6 C)    TempSrc:  Oral    SpO2: 100%  95% 99%   Eyes: Anicteric no pallor. ENMT: No discharge from the ears eyes nose and mouth. Neck: No mass felt.  No neck rigidity. Respiratory: No rhonchi or  crepitations. Cardiovascular: S1-S2 heard. Abdomen: Soft nontender bowel sound present. Musculoskeletal: No edema.   Skin: No rash. Neurologic: Alert awake oriented time place and person.  Moves all extremities. Psychiatric: Appears normal.  Normal affect.   Labs on Admission: I have personally reviewed following labs and imaging studies  CBC: Recent Labs  Lab 01/02/22 1828  WBC 8.0  NEUTROABS 6.1  HGB 15.2*  HCT 48.2*  MCV 86.7  PLT 893   Basic Metabolic Panel: Recent Labs  Lab 01/02/22 1828  NA 135  K 4.9  CL 96*  CO2 27  GLUCOSE 94  BUN 28*  CREATININE 4.83*  CALCIUM 8.8*   GFR: CrCl cannot be calculated (Unknown ideal weight.). Liver Function Tests: Recent Labs  Lab 01/02/22 1828  AST 20  ALT 14  ALKPHOS 69  BILITOT 0.9  PROT 8.0  ALBUMIN 4.3   No results for  input(s): "LIPASE", "AMYLASE" in the last 168 hours. No results for input(s): "AMMONIA" in the last 168 hours. Coagulation Profile: No results for input(s): "INR", "PROTIME" in the last 168 hours. Cardiac Enzymes: No results for input(s): "CKTOTAL", "CKMB", "CKMBINDEX", "TROPONINI" in the last 168 hours. BNP (last 3 results) No results for input(s): "PROBNP" in the last 8760 hours. HbA1C: No results for input(s): "HGBA1C" in the last 72 hours. CBG: No results for input(s): "GLUCAP" in the last 168 hours. Lipid Profile: No results for input(s): "CHOL", "HDL", "LDLCALC", "TRIG", "CHOLHDL", "LDLDIRECT" in the last 72 hours. Thyroid Function Tests: No results for input(s): "TSH", "T4TOTAL", "FREET4", "T3FREE", "THYROIDAB" in the last 72 hours. Anemia Panel: No results for input(s): "VITAMINB12", "FOLATE", "FERRITIN", "TIBC", "IRON", "RETICCTPCT" in the last 72 hours. Urine analysis:    Component Value Date/Time   COLORURINE YELLOW 09/05/2021 1305   APPEARANCEUR HAZY (A) 09/05/2021 1305   APPEARANCEUR Clear 05/15/2020 1716   LABSPEC 1.011 09/05/2021 1305   PHURINE 9.0 (H) 09/05/2021 1305    GLUCOSEU 50 (A) 09/05/2021 1305   HGBUR NEGATIVE 09/05/2021 1305   BILIRUBINUR NEGATIVE 09/05/2021 1305   BILIRUBINUR Negative 05/15/2020 1716   KETONESUR NEGATIVE 09/05/2021 1305   PROTEINUR >=300 (A) 09/05/2021 1305   UROBILINOGEN 0.2 09/05/2021 0853   NITRITE NEGATIVE 09/05/2021 1305   LEUKOCYTESUR NEGATIVE 09/05/2021 1305   Sepsis Labs: @LABRCNTIP (procalcitonin:4,lacticidven:4) )No results found for this or any previous visit (from the past 240 hour(s)).   Radiological Exams on Admission: CT Head Wo Contrast  Result Date: 01/02/2022 CLINICAL DATA:  Headache. EXAM: CT HEAD WITHOUT CONTRAST TECHNIQUE: Contiguous axial images were obtained from the base of the skull through the vertex without intravenous contrast. RADIATION DOSE REDUCTION: This exam was performed according to the departmental dose-optimization program which includes automated exposure control, adjustment of the mA and/or kV according to patient size and/or use of iterative reconstruction technique. COMPARISON:  CT examination dated November 03, 2021 FINDINGS: Brain: No evidence of acute infarction, hemorrhage, hydrocephalus, extra-axial collection or mass lesion/mass effect. Scattered areas of low attenuation in the periventricular white matter presumed chronic microvascular ischemic changes. Vascular: No hyperdense vessel or unexpected calcification. Skull: Normal. Negative for fracture or focal lesion. Sinuses/Orbits: No acute finding. Other: None. IMPRESSION: No acute intracranial abnormality. Electronically Signed   By: Keane Police D.O.   On: 01/02/2022 23:05   CT Chest Wo Contrast  Result Date: 01/02/2022 CLINICAL DATA:  Hypertension, chest pain EXAM: CT CHEST WITHOUT CONTRAST TECHNIQUE: Multidetector CT imaging of the chest was performed following the standard protocol without IV contrast. RADIATION DOSE REDUCTION: This exam was performed according to the departmental dose-optimization program which includes automated exposure  control, adjustment of the mA and/or kV according to patient size and/or use of iterative reconstruction technique. COMPARISON:  09/21/2021 FINDINGS: Cardiovascular: Cardiomegaly. Scattered coronary artery and moderate aortic calcifications. No aneurysm. Mediastinum/Nodes: No mediastinal, hilar, or axillary adenopathy. Trachea and esophagus are unremarkable. Thyroid unremarkable. Lungs/Pleura: Calcified granulomas in the right lung. No confluent airspace opacities or effusions. Upper Abdomen: No acute findings Musculoskeletal: Chest wall soft tissues are unremarkable. No acute bony abnormality. IMPRESSION: No acute cardiopulmonary disease. Cardiomegaly, coronary artery disease. Old granulomatous disease. Aortic Atherosclerosis (ICD10-I70.0). Electronically Signed   By: Rolm Baptise M.D.   On: 01/02/2022 23:03    EKG: Independently reviewed.  Normal sinus rhythm.  Assessment/Plan Principal Problem:   Hypertensive urgency Active Problems:   Type 2 diabetes mellitus with complication, without long-term current use of insulin (HCC)    Hypertensive  urgency -patient has been restarted on labetalol which she used to take 400 mg twice daily.  Discussed with pharmacy and dosed again.  As needed IV labetalol has been ordered.  We will continue home dose of amlodipine and losartan.  Follow blood pressure trends. Chest pain likely from hypertensive urgency.  Cardiac markers negative.  CT scan did show some coronary artery disease.  Eventually may need coronary artery disease work-up. Headache improved with blood pressure control.  We will closely monitor.  No focal deficit at this time. ESRD on hemodialysis nephrology has been ordered.  I think blood pressure also will improve with dialysis. Diabetes mellitus type 2 we will keep patient sliding scale coverage.   DVT prophylaxis: SCDs.  Avoiding anticoagulation until blood pressure improves. Code Status: Full code. Family Communication: Patient's husband and  daughter. Disposition Plan: Home. Consults called: Nephrology was notified by ER physician. Admission status: Observation.   Rise Patience MD [facility] Pager [phone].  If 7PM-7AM, please contact night-coverage [email or web address] Password [email or web address]  01/03/2022, 12:03 AM

## 2022-01-03 NOTE — Progress Notes (Signed)
This RN was called into the room.  With the use of video interpreter, pt stated that she felt constipated and was bearing down to have BM.  Pt then experienced diaphoresis, chest pain, shortness of breath and head pain in the occipital region.  Pt was escorted back to bed by this RN and her husband.  Pt relaxed in bed, and stated that her chest pain and head pain had gone away.  Vitals obtained.  Pt appears more relaxed and laying in bed comfortably at this time.  Bed alarm on and husband at bedside.  Dr. Alcario Drought notified and awaiting response.    01/03/22 2245  Vitals  Temp 97.9 F (36.6 C)  Temp Source Oral  BP (!) 144/69  MAP (mmHg) 90  BP Location Right Arm  BP Method Automatic  Patient Position (if appropriate) Lying  Pulse Rate Source Monitor  ECG Heart Rate 67  Resp (!) 22  MEWS COLOR  MEWS Score Color Green  Oxygen Therapy  SpO2 99 %  O2 Device Room Air

## 2022-01-03 NOTE — Consult Note (Addendum)
[facility] Renal Consultation Note    Indication for Consultation:  Management of ESRD/hemodialysis, anemia, hypertension/volume, and secondary hyperparathyroidism. PCP:  HPI: Rachael Burke is a 66 y.o. female with ESRD, T2DM, HTN who was admitted with HTN urgency.  Sent to ED after dialysis yesterday with severe HTN and headache which started earlier on Friday 8/4. She denied blurred vision, slurred speech, CP, dyspnea, N/V/D. In ED, her BP was initially 225/87. Intake labs with Na 135, K 4.9, Ca 8.8, WBC 8, Hgb 15.2. BNP elevated at 2185. Trop 19 -> 15. Non-contrasted Head CT and chest CT without acute findings. She was given her usual BP medications as well as IV hydralazine, labetalol, and IV fentanyl for pain. Her BP medications had recently been changed, specifically labetalol was changed to Coreg.  Today, she feels a little better. BP remains quite high and she is getting her PO medications. No CP/dyspnea. Tells me that often gets leg pain and cramping with HD.   Dialyzes on MWF schedule at [facility] [employer] unit. Compliant with HD per notes. Uses AVF without any recent issues.  Past Medical History:  Diagnosis Date   Anemia    Anxiety    Arthritis    Diabetes mellitus without complication (HCC)    type 2   Heart murmur    echo 07/02/20: Mild MR/TR, mild-moderate AV sclerosis, bicuspid or functional bicuspid AV, no evidence of AS. Murmr felt due to AV.   History of blood transfusion    Hyperlipidemia    Hypertension    Pneumonia    PONV (postoperative nausea and vomiting)    Renal disorder    M-W-F   Past Surgical History:  Procedure Laterality Date   AV FISTULA PLACEMENT Left 05/20/2020   Procedure: INSERTION OF LEFT ARM ARTERIOVENOUS (AV) FISTULA;  Surgeon: Marty Heck, MD;  Location: [place];  Service: Vascular;  Laterality: Left;   [place] Left 08/05/2020   Procedure: SECOND STAGE BASILIC VEIN TRANSPOSITION;  Surgeon: Marty Heck, MD;  Location: [place];  Service: Vascular;  Laterality: Left;   BIOPSY  09/22/2021   Procedure: BIOPSY;  Surgeon: Ladene Artist, MD;  Location: [facility] ENDOSCOPY;  Service: Gastroenterology;;   CESAREAN SECTION     CHOLECYSTECTOMY     ESOPHAGOGASTRODUODENOSCOPY (EGD) WITH PROPOFOL N/A 09/22/2021   Procedure: ESOPHAGOGASTRODUODENOSCOPY (EGD) WITH PROPOFOL;  Surgeon: Ladene Artist, MD;  Location: [facility] ENDOSCOPY;  Service: Gastroenterology;  Laterality: N/A;   INSERTION OF DIALYSIS CATHETER N/A 05/20/2020   Procedure: INSERTION OF DIALYSIS CATHETER;  Surgeon: Marty Heck, MD;  Location: [place];  Service: Vascular;  Laterality: N/A;   VIDEO BRONCHOSCOPY Bilateral 09/26/2021   Procedure: VIDEO BRONCHOSCOPY WITHOUT FLUORO;  Surgeon: Candee Furbish, MD;  Location: [facility] ENDOSCOPY;  Service: Pulmonary;  Laterality: Bilateral;   Family History  Problem Relation Age of Onset   Diabetes Mother    Hypertension Father    Diabetes Sister    Diabetes Brother    Diabetes Brother    Social History:  reports that she has never smoked. She has never used smokeless tobacco. She reports that she does not drink alcohol and does not use drugs.  ROS: As per HPI otherwise negative.  Physical Exam: Vitals:   01/03/22 0800 01/03/22 0850 01/03/22 0852 01/03/22 1008  BP: (!) 158/63 (!) 180/71  (!) 147/69  Pulse: 97 67 67 66  Resp: 15   15  Temp: 98.7 F (37.1 C)     TempSrc:  Oral     SpO2:  96%  95%  Weight:      Height:         General: Well developed, well nourished, in no acute distress. Room air. Head: Normocephalic, atraumatic, sclera non-icteric, mucus membranes are moist. Neck: Supple without lymphadenopathy/masses. JVD not elevated. Lungs: Dry crackles in R base only, otherwise clear. Heart: RRR; 2/6 murmur Abdomen: Soft, mild generalized tenderness without guarding. Musculoskeletal:  Strength and tone appear normal for age. Lower extremities: No edema or ischemic changes, no  open wounds. 2+ pedal pulses bilaterally. Neuro: Alert and oriented X 3. Moves all extremities spontaneously. Psych:  Responds to questions appropriately with a normal affect (in [place]). Dialysis Access: L AVF + thrill  No Known Allergies Prior to Admission medications   Medication Sig Start Date End Date Taking? Authorizing Provider  acetaminophen (TYLENOL) 325 MG tablet Take 2 tablets (650 mg total) by mouth every 6 (six) hours as needed for mild pain or moderate pain. 09/05/21  Yes Carlisle Cater, PA-C  calcium acetate (PHOSLO) 667 MG capsule Take 667 mg by mouth 3 (three) times daily. 04/02/21  Yes [provider]  carvedilol (COREG) 6.25 MG tablet Take 6.25 mg by mouth 2 (two) times daily. 12/24/21  Yes [provider]  diclofenac Sodium (VOLTAREN) 1 % GEL Apply 4 g topically 4 (four) times daily. Patient taking differently: Apply 4 g topically 4 (four) times daily as needed (pain). 06/18/20  Yes Just, Laurita Quint, FNP  gabapentin (NEURONTIN) 100 MG capsule Take 100 mg by mouth at bedtime. 09/03/21  Yes [provider]  hydrALAZINE (APRESOLINE) 25 MG tablet Take 1 tablet (25 mg total) by mouth 3 (three) times daily. 09/23/21  Yes Elgergawy, Silver Huguenin, MD  linagliptin (TRADJENTA) 5 MG TABS tablet Take 5 mg by mouth daily.   Yes [provider]  losartan (COZAAR) 100 MG tablet Take 100 mg by mouth daily. 10/08/21  Yes [provider]  ofloxacin (OCUFLOX) 0.3 % ophthalmic solution Place 1 drop into the right eye 4 (four) times daily. 12/19/21  Yes [provider]  olopatadine (PATANOL) 0.1 % ophthalmic solution Place 1 drop into both eyes 2 (two) times daily as needed for allergies.   Yes [provider]  omeprazole (PRILOSEC OTC) 20 MG tablet Take 20 mg by mouth daily as needed (indigestion).   Yes [provider]  ondansetron (ZOFRAN-ODT) 4 MG disintegrating tablet Take 1 tablet (4 mg total) by mouth every 8 (eight) hours as needed  for nausea or vomiting. 09/05/21  Yes Carlisle Cater, PA-C  amLODipine (NORVASC) 10 MG tablet Take 10 mg by mouth daily. Patient not taking: Reported on 01/03/2022    [provider]  atorvastatin (LIPITOR) 20 MG tablet Take 1 tablet (20 mg total) by mouth daily. Patient not taking: Reported on 01/03/2022 01/01/20   Charlynne Cousins, MD  escitalopram (LEXAPRO) 10 MG tablet Take 1 tablet (10 mg total) by mouth daily. Patient not taking: Reported on 09/24/2021 07/23/20 10/21/20  Horald Pollen, MD  gabapentin (NEURONTIN) 300 MG capsule Take 300 mg by mouth 2 (two) times daily. Patient not taking: Reported on 01/03/2022 12/24/21   [provider]  labetalol (NORMODYNE) 200 MG tablet Take 2 tablets (400 mg total) by mouth 2 (two) times daily. Patient not taking: Reported on 01/03/2022 09/23/21   Elgergawy, Silver Huguenin, MD  sitaGLIPtin (JANUVIA) 25 MG tablet Take 1 tablet (25 mg total) by mouth daily. Patient not taking: Reported on 09/24/2021  07/23/20 10/21/20  Horald Pollen, MD   Current Facility-Administered Medications  Medication Dose Route Frequency Provider Last Rate Last Admin   acetaminophen (TYLENOL) tablet 650 mg  650 mg Oral Q6H PRN Rise Patience, MD   650 mg at 01/03/22 0203   amLODipine (NORVASC) tablet 10 mg  10 mg Oral QHS Rise Patience, MD   10 mg at 01/03/22 0246   Chlorhexidine Gluconate Cloth 2 % PADS 6 each  6 each Topical Q0600 Loren Racer, PA-C   6 each at 01/03/22 0854   gabapentin (NEURONTIN) capsule 100 mg  100 mg Oral QHS Rise Patience, MD   100 mg at 01/03/22 0247   insulin aspart (novoLOG) injection 0-6 Units  0-6 Units Subcutaneous TID WC Rise Patience, MD       labetalol (NORMODYNE) injection 10 mg  10 mg Intravenous Q2H PRN Rise Patience, MD       labetalol (NORMODYNE) tablet 400 mg  400 mg Oral BID Rise Patience, MD   400 mg at 01/03/22 5784   losartan (COZAAR) tablet 100 mg  100 mg Oral Daily  Rise Patience, MD   100 mg at 01/03/22 0854   Labs: Basic Metabolic Panel: Recent Labs  Lab 01/02/22 1828 01/03/22 0536  NA 135 134*  K 4.9 5.4*  CL 96* 96*  CO2 27 27  GLUCOSE 94 94  BUN 28* 35*  CREATININE 4.83* 5.76*  CALCIUM 8.8* 8.6*   Liver Function Tests: Recent Labs  Lab 01/02/22 1828 01/03/22 0536  AST 20 16  ALT 14 13  ALKPHOS 69 56  BILITOT 0.9 0.8  PROT 8.0 6.7  ALBUMIN 4.3 3.6   CBC: Recent Labs  Lab 01/02/22 1828 01/03/22 0536  WBC 8.0 9.0  NEUTROABS 6.1  --   HGB 15.2* 13.8  HCT 48.2* 43.4  MCV 86.7 86.8  PLT 150 150   Studies/Results: CT Head Wo Contrast  Result Date: 01/02/2022 CLINICAL DATA:  Headache. EXAM: CT HEAD WITHOUT CONTRAST TECHNIQUE: Contiguous axial images were obtained from the base of the skull through the vertex without intravenous contrast. RADIATION DOSE REDUCTION: This exam was performed according to the departmental dose-optimization program which includes automated exposure control, adjustment of the mA and/or kV according to patient size and/or use of iterative reconstruction technique. COMPARISON:  CT examination dated November 03, 2021 FINDINGS: Brain: No evidence of acute infarction, hemorrhage, hydrocephalus, extra-axial collection or mass lesion/mass effect. Scattered areas of low attenuation in the periventricular white matter presumed chronic microvascular ischemic changes. Vascular: No hyperdense vessel or unexpected calcification. Skull: Normal. Negative for fracture or focal lesion. Sinuses/Orbits: No acute finding. Other: None. IMPRESSION: No acute intracranial abnormality. Electronically Signed   By: Keane Police D.O.   On: 01/02/2022 23:05   CT Chest Wo Contrast  Result Date: 01/02/2022 CLINICAL DATA:  Hypertension, chest pain EXAM: CT CHEST WITHOUT CONTRAST TECHNIQUE: Multidetector CT imaging of the chest was performed following the standard protocol without IV contrast. RADIATION DOSE REDUCTION: This exam was  performed according to the departmental dose-optimization program which includes automated exposure control, adjustment of the mA and/or kV according to patient size and/or use of iterative reconstruction technique. COMPARISON:  09/21/2021 FINDINGS: Cardiovascular: Cardiomegaly. Scattered coronary artery and moderate aortic calcifications. No aneurysm. Mediastinum/Nodes: No mediastinal, hilar, or axillary adenopathy. Trachea and esophagus are unremarkable. Thyroid unremarkable. Lungs/Pleura: Calcified granulomas in the right lung. No confluent airspace opacities or effusions. Upper Abdomen: No acute findings Musculoskeletal: Chest wall  soft tissues are unremarkable. No acute bony abnormality. IMPRESSION: No acute cardiopulmonary disease. Cardiomegaly, coronary artery disease. Old granulomatous disease. Aortic Atherosclerosis (ICD10-I70.0). Electronically Signed   By: Rolm Baptise M.D.   On: 01/02/2022 23:03    Dialysis Orders:  MWF at [facility] 3:30hr, 400/500, EDW 63.3kg, 2K/2Ca, UFP #2, AVF, no heparin - Hectoral 4mcg IV q HD - No ESA (last Hgb 13)  Assessment/Plan:  HTN urgency with head/neck pain: Head CT negative. Pain improving with IV fentanyl. BP remains quite high. Honestly, she really doesn't seem to have any extra fluid on her. BUT, BNP up a little and K is 5.4, so will do a short extra-HD today to see if can get a little more fluid off her and improve the BP. Unclear if will help. Has been restarted on prior dose of labetalol 400mg  BID, as well as home amlodipine and losartan.  ESRD:  Usual MWF schedule. S/p full HD on 8/4. Short HD today, as above.  Anemia: Hgb > 11, no ESA needed.  Metabolic bone disease: Ca ok, Phos pending. Continue home meds.  Nutrition:  Alb 3.6 - continue renal diet. T2DM: SS insulin per primary.  Veneta Penton, PA-C 01/03/2022, 10:11 AM  [place]  Nephrology attending: I have personally seen and examined the patient.  Chart reviewed.  I agree with  above.  Patient's family friend was present at the bedside. 66 year old female ESRD on HD admitted with hypertensive urgency.  She had dialysis yesterday with ultrafiltration.  She received labetalol, hydralazine and losartan with improving blood pressure.  On examination she looks euvolemic however labs showed mild hyponatremia and hyperkalemia.  We will try extra dialysis session today to see if it helps blood pressure.  Continue home antihypertensive medications.  [facility].

## 2022-01-04 DIAGNOSIS — I160 Hypertensive urgency: Secondary | ICD-10-CM | POA: Diagnosis not present

## 2022-01-04 LAB — TROPONIN I (HIGH SENSITIVITY)
Troponin I (High Sensitivity): 19 ng/L — ABNORMAL HIGH (ref <18)
Troponin I (High Sensitivity): 19 ng/L — ABNORMAL HIGH (ref <18)

## 2022-01-04 LAB — GLUCOSE, CAPILLARY
Glucose-Capillary: 105 mg/dL — ABNORMAL HIGH (ref 70–99)
Glucose-Capillary: 219 mg/dL — ABNORMAL HIGH (ref 70–99)

## 2022-01-04 LAB — HEPATITIS B SURFACE ANTIBODY, QUANTITATIVE: Hep B S AB Quant (Post): 133.3 m[IU]/mL (ref >9.9)

## 2022-01-04 MED ORDER — ISOSORB DINITRATE-HYDRALAZINE 20-37.5 MG PO TABS: 1.0000 | ORAL_TABLET | Freq: Three times a day (TID) | ORAL | 2 refills | Status: DC

## 2022-01-04 MED ORDER — CARVEDILOL 25 MG PO TABS: 25.0000 mg | ORAL_TABLET | Freq: Two times a day (BID) | ORAL | 2 refills | Status: DC

## 2022-01-04 MED ORDER — NITROGLYCERIN 0.4 MG SL SUBL
0.4000 mg | SUBLINGUAL_TABLET | SUBLINGUAL | Status: DC | PRN
Start: 2022-01-04 — End: 2022-01-04
  Administered 2022-01-04: 0.4 mg via SUBLINGUAL
  Filled 2022-01-04: qty 1

## 2022-01-04 NOTE — Discharge Summary (Signed)
DISCHARGE SUMMARY  Rachael Burke  MR#: [id number]  DOB:[birth year]  Date of Admission: 01/02/2022 Date of Discharge: 01/04/2022  Attending Physician:Jaeleen Inzunza Hennie Duos, MD  Patient's VHQ:[id number], Maud Deed, PA  Consults: Nephrology   Disposition: D/C  home  Follow-up Appts:  Follow-up Information     Trey Sailors, PA Follow up.   Specialty: Physician Assistant Contact information: [place] [address] [phone]                 Tests Needing Follow-up: -follow BP as further titration of medical therapy likely to be required   Discharge Diagnoses: Hypertensive urgency Chest pain Occipital headache ESRD on HD MWF DM2  Initial presentation: 66 year old with a history of ESRD on HD MWF, HTN, and DM2 who was brought to the ER with complaints of occipital headache and substernal nonradiating chest pain.  In the ER the patient was found to have a systolic blood pressure of 240.  CT head was unrevealing.  Hospital Course:  Hypertensive urgency Severe uncontrolled systolic blood pressure leading to chest pain and headache -medical therapy adjusted during hospital stay -maximum volume was removed during dialysis, to the point the patient became significantly symptomatic with cramping -despite this her blood pressure did not normalize therefore ongoing medical therapy is felt to be the key -this was discussed with the patient using an interpreter, and it was explained that further titration of her medications in the outpatient setting would likely be required   Chest pain Due to hypertensive urgency -cardiac markers not worrisome - CT chest without evidence of coronary artery calcification -suspect this was demand related in the setting of malignant hypertension   Occipital headache Due to hypertensive urgency -no focal neurologic deficits -resolved with improvement in blood pressure control -CT head unrevealing during this  admission   ESRD on HD MWF Nephrology followed and attended to dialysis   DM2 CBG well controlled -A1c actually 4.5 in April of this year    Allergies as of 01/04/2022   No Known Allergies      Medication List     STOP taking these medications    escitalopram 10 MG tablet Commonly known as: Lexapro   hydrALAZINE 25 MG tablet Commonly known as: APRESOLINE   labetalol 200 MG tablet Commonly known as: NORMODYNE       TAKE these medications    acetaminophen 325 MG tablet Commonly known as: TYLENOL Take 2 tablets (650 mg total) by mouth every 6 (six) hours as needed for mild pain or moderate pain.   amLODipine 10 MG tablet Commonly known as: NORVASC Take 10 mg by mouth daily.   atorvastatin 20 MG tablet Commonly known as: LIPITOR Take 1 tablet (20 mg total) by mouth daily.   calcium acetate 667 MG capsule Commonly known as: PHOSLO Take 667 mg by mouth 3 (three) times daily.   carvedilol 25 MG tablet Commonly known as: COREG Take 1 tablet (25 mg total) by mouth 2 (two) times daily with a meal. What changed:  medication strength how much to take when to take this   diclofenac Sodium 1 % Gel Commonly known as: Voltaren Apply 4 g topically 4 (four) times daily. What changed:  when to take this reasons to take this   gabapentin 100 MG capsule Commonly known as: NEURONTIN Take 100 mg by mouth at bedtime.   isosorbide-hydrALAZINE 20-37.5 MG tablet Commonly known as: BIDIL Take 1 tablet by mouth 3 (three) times daily.   linagliptin  5 MG Tabs tablet Commonly known as: TRADJENTA Take 5 mg by mouth daily.   losartan 100 MG tablet Commonly known as: COZAAR Take 100 mg by mouth daily.   ofloxacin 0.3 % ophthalmic solution Commonly known as: OCUFLOX Place 1 drop into the right eye 4 (four) times daily.   olopatadine 0.1 % ophthalmic solution Commonly known as: PATANOL Place 1 drop into both eyes 2 (two) times daily as needed for allergies.    omeprazole 20 MG tablet Commonly known as: PRILOSEC OTC Take 20 mg by mouth daily as needed (indigestion).   ondansetron 4 MG disintegrating tablet Commonly known as: ZOFRAN-ODT Take 1 tablet (4 mg total) by mouth every 8 (eight) hours as needed for nausea or vomiting.   sitaGLIPtin 25 MG tablet Commonly known as: Januvia Take 1 tablet (25 mg total) by mouth daily.        Day of Discharge BP (!) 164/66 (BP Location: Right Arm)   Pulse 72   Temp 98.5 F (36.9 C) (Oral)   Resp 15   Ht (S) 5' (1.524 m) Comment: approximation, patient unsure  Wt 63.4 kg   SpO2 94%   BMI 27.30 kg/m   Physical Exam: General: No acute respiratory distress Lungs: Clear to auscultation bilaterally without wheezes or crackles Cardiovascular: Regular rate and rhythm without murmur gallop or rub normal S1 and S2 Abdomen: Nontender, nondistended, soft, bowel sounds positive, no rebound, no ascites, no appreciable mass Extremities: No significant cyanosis, clubbing, or edema bilateral lower extremities  Basic Metabolic Panel: Recent Labs  Lab 01/02/22 1828 01/03/22 0536  NA 135 134*  K 4.9 5.4*  CL 96* 96*  CO2 27 27  GLUCOSE 94 94  BUN 28* 35*  CREATININE 4.83* 5.76*  CALCIUM 8.8* 8.6*    Liver Function Tests: Recent Labs  Lab 01/02/22 1828 01/03/22 0536  AST 20 16  ALT 14 13  ALKPHOS 69 56  BILITOT 0.9 0.8  PROT 8.0 6.7  ALBUMIN 4.3 3.6   CBC: Recent Labs  Lab 01/02/22 1828 01/03/22 0536  WBC 8.0 9.0  NEUTROABS 6.1  --   HGB 15.2* 13.8  HCT 48.2* 43.4  MCV 86.7 86.8  PLT 150 150     Time spent in discharge (includes decision making & examination of pt): 35 minutes  01/04/2022, 3:08 PM   Cherene Altes, MD [facility] Office  [phone]

## 2022-01-04 NOTE — Progress Notes (Addendum)
[facility] Progress Note   Subjective:  Seen in room. Cramping and CP after HD last night, seems c/w pulling too much fluid off her. EKG/trops were normal. Feels better today. BP largely unaffected with additional UF - suspect will just need to advance her BP medications.  Objective Vitals:   01/03/22 2249 01/04/22 0015 01/04/22 0328 01/04/22 0752  BP: (!) 158/71 (!) 144/64 (!) 167/77 (!) 164/66  Pulse: 64 64 72 72  Resp: 20 20 18 15   Temp:   98.8 F (37.1 C) 98.5 F (36.9 C)  TempSrc:   Oral Oral  SpO2: 97% 100% 94% 94%  Weight:      Height:       Physical Exam General: Well appearing woman, NAD. Room air. Heart: RRR; 2/6 murmur Lungs: CTAB; no rales Abdomen: soft Extremities: No LE edema Dialysis Access: L AVF + thrill  Additional Objective Labs: Basic Metabolic Panel: Recent Labs  Lab 01/02/22 1828 01/03/22 0536  NA 135 134*  K 4.9 5.4*  CL 96* 96*  CO2 27 27  GLUCOSE 94 94  BUN 28* 35*  CREATININE 4.83* 5.76*  CALCIUM 8.8* 8.6*   Liver Function Tests: Recent Labs  Lab 01/02/22 1828 01/03/22 0536  AST 20 16  ALT 14 13  ALKPHOS 69 56  BILITOT 0.9 0.8  PROT 8.0 6.7  ALBUMIN 4.3 3.6   CBC: Recent Labs  Lab 01/02/22 1828 01/03/22 0536  WBC 8.0 9.0  NEUTROABS 6.1  --   HGB 15.2* 13.8  HCT 48.2* 43.4  MCV 86.7 86.8  PLT 150 150   Studies/Results: CT Head Wo Contrast  Result Date: 01/02/2022 CLINICAL DATA:  Headache. EXAM: CT HEAD WITHOUT CONTRAST TECHNIQUE: Contiguous axial images were obtained from the base of the skull through the vertex without intravenous contrast. RADIATION DOSE REDUCTION: This exam was performed according to the departmental dose-optimization program which includes automated exposure control, adjustment of the mA and/or kV according to patient size and/or use of iterative reconstruction technique. COMPARISON:  CT examination dated November 03, 2021 FINDINGS: Brain: No evidence of acute infarction, hemorrhage,  hydrocephalus, extra-axial collection or mass lesion/mass effect. Scattered areas of low attenuation in the periventricular white matter presumed chronic microvascular ischemic changes. Vascular: No hyperdense vessel or unexpected calcification. Skull: Normal. Negative for fracture or focal lesion. Sinuses/Orbits: No acute finding. Other: None. IMPRESSION: No acute intracranial abnormality. Electronically Signed   By: Keane Police D.O.   On: 01/02/2022 23:05   CT Chest Wo Contrast  Result Date: 01/02/2022 CLINICAL DATA:  Hypertension, chest pain EXAM: CT CHEST WITHOUT CONTRAST TECHNIQUE: Multidetector CT imaging of the chest was performed following the standard protocol without IV contrast. RADIATION DOSE REDUCTION: This exam was performed according to the departmental dose-optimization program which includes automated exposure control, adjustment of the mA and/or kV according to patient size and/or use of iterative reconstruction technique. COMPARISON:  09/21/2021 FINDINGS: Cardiovascular: Cardiomegaly. Scattered coronary artery and moderate aortic calcifications. No aneurysm. Mediastinum/Nodes: No mediastinal, hilar, or axillary adenopathy. Trachea and esophagus are unremarkable. Thyroid unremarkable. Lungs/Pleura: Calcified granulomas in the right lung. No confluent airspace opacities or effusions. Upper Abdomen: No acute findings Musculoskeletal: Chest wall soft tissues are unremarkable. No acute bony abnormality. IMPRESSION: No acute cardiopulmonary disease. Cardiomegaly, coronary artery disease. Old granulomatous disease. Aortic Atherosclerosis (ICD10-I70.0). Electronically Signed   By: Rolm Baptise M.D.   On: 01/02/2022 23:03   Medications:   amLODipine  10 mg Oral QHS   gabapentin  100 mg Oral QHS  insulin aspart  0-6 Units Subcutaneous TID WC   isosorbide-hydrALAZINE  1 tablet Oral TID   labetalol  400 mg Oral BID   losartan  100 mg Oral Daily   polyethylene glycol  17 g Oral Daily     Dialysis Orders: MWF at [facility] 3:30hr, 400/500, EDW 63.3kg, 2K/2Ca, UFP #2, AVF, no heparin - Hectoral 69mcg IV q HD - No ESA (last Hgb 13)   Assessment/Plan:  HTN urgency with head/neck pain: Head CT negative. Pain improving with IV fentanyl. BP high and largely unaffected by dialysis. Attempted extra HD for additional fluid removal, developed severe cramping and CP afterwards. EKG/Trop negative. Seems pretty dry this AM. I don't think fluid is her issue. BiDil added last night, follow BP today and can advance further as outpatietn. ESRD:  Usual MWF schedule. S/p extra HD 8/5 - cramping after. Next HD Monday 8/7.  Anemia: Hgb > 11, no ESA needed.  Metabolic bone disease: Ca ok, Phos pending. Continue home meds.  Nutrition:  Alb 3.6 - continue renal diet. T2DM: SS insulin per primary.    Veneta Penton, PA-C 01/04/2022, 9:02 AM  [place]  Nephrology attending: I have personally seen and examined the patient.  Chart reviewed.  I agree with above. ESRD on HD admitted with hypertensive urgency.  Received extra dialysis yesterday with UF of around 2 L.  Patient had some cramping at the end.  Her volume status is acceptable if anything she is on drier side.  Noted she was added on BiDil and currently on losartan, labetalol and amlodipine.  Monitor blood pressure.  Plan for regular HD tomorrow.  Katheran James, [facility].

## 2022-01-04 NOTE — Progress Notes (Addendum)
Pt called out again with chest pain, 5/10 and bilateral leg cramping.  Dr. Alcario Drought notified.  EKG obtained, troponins ordered and medicated per MAR.    At 0108, pt resting comfortably in no pain or distress.

## 2022-01-05 ENCOUNTER — Telehealth: Payer: Self-pay | Admitting: Nephrology

## 2022-01-05 DIAGNOSIS — E1122 Type 2 diabetes mellitus with diabetic chronic kidney disease: Secondary | ICD-10-CM | POA: Diagnosis not present

## 2022-01-05 DIAGNOSIS — N186 End stage renal disease: Secondary | ICD-10-CM | POA: Diagnosis not present

## 2022-01-05 DIAGNOSIS — R197 Diarrhea, unspecified: Secondary | ICD-10-CM | POA: Diagnosis not present

## 2022-01-05 DIAGNOSIS — R110 Nausea: Secondary | ICD-10-CM | POA: Diagnosis not present

## 2022-01-05 DIAGNOSIS — N2581 Secondary hyperparathyroidism of renal origin: Secondary | ICD-10-CM | POA: Diagnosis not present

## 2022-01-05 DIAGNOSIS — D509 Iron deficiency anemia, unspecified: Secondary | ICD-10-CM | POA: Diagnosis not present

## 2022-01-05 DIAGNOSIS — I10 Essential (primary) hypertension: Secondary | ICD-10-CM | POA: Diagnosis not present

## 2022-01-05 DIAGNOSIS — Z992 Dependence on renal dialysis: Secondary | ICD-10-CM | POA: Diagnosis not present

## 2022-01-05 DIAGNOSIS — D631 Anemia in chronic kidney disease: Secondary | ICD-10-CM | POA: Diagnosis not present

## 2022-01-05 NOTE — Telephone Encounter (Signed)
Transition of care contact from inpatient facility  Date of discharge: 01/04/22 Date of contact: 01/05/22 Method: Phone Spoke to: Patient's daughter   Patient contacted to discuss transition of care from recent inpatient hospitalization. Patient was admitted to [facility] from 8/4- 01/04/22 with discharge diagnosis of hypertensive urgency.   Medication changes were reviewed. Daughter understand changes.   Patient will follow up with his/her outpatient HD unit on: She went to dialysis today with no issues. Next dialysis will be Wednesday 8/9.

## 2022-01-06 DIAGNOSIS — N8111 Cystocele, midline: Secondary | ICD-10-CM | POA: Diagnosis not present

## 2022-01-06 DIAGNOSIS — N189 Chronic kidney disease, unspecified: Secondary | ICD-10-CM | POA: Diagnosis not present

## 2022-01-06 DIAGNOSIS — N816 Rectocele: Secondary | ICD-10-CM | POA: Diagnosis not present

## 2022-01-07 ENCOUNTER — Ambulatory Visit: Payer: Medicare Other | Admitting: Internal Medicine

## 2022-01-07 DIAGNOSIS — H4323 Crystalline deposits in vitreous body, bilateral: Secondary | ICD-10-CM | POA: Diagnosis not present

## 2022-01-07 DIAGNOSIS — R110 Nausea: Secondary | ICD-10-CM | POA: Diagnosis not present

## 2022-01-07 DIAGNOSIS — N186 End stage renal disease: Secondary | ICD-10-CM | POA: Diagnosis not present

## 2022-01-07 DIAGNOSIS — N2581 Secondary hyperparathyroidism of renal origin: Secondary | ICD-10-CM | POA: Diagnosis not present

## 2022-01-07 DIAGNOSIS — D509 Iron deficiency anemia, unspecified: Secondary | ICD-10-CM | POA: Diagnosis not present

## 2022-01-07 DIAGNOSIS — I10 Essential (primary) hypertension: Secondary | ICD-10-CM | POA: Diagnosis not present

## 2022-01-07 DIAGNOSIS — H43392 Other vitreous opacities, left eye: Secondary | ICD-10-CM | POA: Diagnosis not present

## 2022-01-07 DIAGNOSIS — Z01818 Encounter for other preprocedural examination: Secondary | ICD-10-CM | POA: Diagnosis not present

## 2022-01-07 DIAGNOSIS — E113512 Type 2 diabetes mellitus with proliferative diabetic retinopathy with macular edema, left eye: Secondary | ICD-10-CM | POA: Diagnosis not present

## 2022-01-07 DIAGNOSIS — R197 Diarrhea, unspecified: Secondary | ICD-10-CM | POA: Diagnosis not present

## 2022-01-07 DIAGNOSIS — D631 Anemia in chronic kidney disease: Secondary | ICD-10-CM | POA: Diagnosis not present

## 2022-01-07 DIAGNOSIS — Z992 Dependence on renal dialysis: Secondary | ICD-10-CM | POA: Diagnosis not present

## 2022-01-07 DIAGNOSIS — E1122 Type 2 diabetes mellitus with diabetic chronic kidney disease: Secondary | ICD-10-CM | POA: Diagnosis not present

## 2022-01-09 ENCOUNTER — Encounter: Payer: Self-pay | Admitting: Internal Medicine

## 2022-01-09 ENCOUNTER — Ambulatory Visit: Payer: Medicare Other | Admitting: Internal Medicine

## 2022-01-09 VITALS — BP 205/90 | HR 72 | Temp 98.0°F | Resp 16 | Ht 60.0 in | Wt 143.0 lb

## 2022-01-09 DIAGNOSIS — Z992 Dependence on renal dialysis: Secondary | ICD-10-CM | POA: Diagnosis not present

## 2022-01-09 DIAGNOSIS — E782 Mixed hyperlipidemia: Secondary | ICD-10-CM

## 2022-01-09 DIAGNOSIS — I10 Essential (primary) hypertension: Secondary | ICD-10-CM | POA: Insufficient documentation

## 2022-01-09 DIAGNOSIS — N186 End stage renal disease: Secondary | ICD-10-CM

## 2022-01-09 DIAGNOSIS — R110 Nausea: Secondary | ICD-10-CM | POA: Diagnosis not present

## 2022-01-09 DIAGNOSIS — D631 Anemia in chronic kidney disease: Secondary | ICD-10-CM | POA: Diagnosis not present

## 2022-01-09 DIAGNOSIS — E1122 Type 2 diabetes mellitus with diabetic chronic kidney disease: Secondary | ICD-10-CM | POA: Diagnosis not present

## 2022-01-09 DIAGNOSIS — N2581 Secondary hyperparathyroidism of renal origin: Secondary | ICD-10-CM | POA: Diagnosis not present

## 2022-01-09 DIAGNOSIS — R197 Diarrhea, unspecified: Secondary | ICD-10-CM | POA: Diagnosis not present

## 2022-01-09 DIAGNOSIS — D509 Iron deficiency anemia, unspecified: Secondary | ICD-10-CM | POA: Diagnosis not present

## 2022-01-09 MED ORDER — NITROGLYCERIN 0.4 MG SL SUBL: 0.4000 mg | SUBLINGUAL_TABLET | SUBLINGUAL | 3 refills | Status: AC | PRN

## 2022-01-09 NOTE — Progress Notes (Signed)
Primary Physician/Referring:  Trey Sailors, PA  Patient ID: Rachael Burke, female    DOB: 1956-01-29, 66 y.o.   MRN: [id number]  Chief Complaint  Patient presents with   New Patient (Initial Visit)   Abnormal ECG   Hypertension   HPI:    Rachael Burke  is a 66 y.o. female with past medical history significant for hypertension, hyperlipidemia, and end-stage renal disease who is here to establish care with cardiology.  Patient noted to have abnormal EKG due to LVH which is consistent with her uncontrolled hypertension.  Patient came into the office today with a systolic blood pressure of 220, it has never been this high per the daughter.  Patient did just get dialysis so her pressure should not be this high.  She was given 1 tab of sublingual nitro here in the office.  And 5 mg IV metoprolol.  Her blood pressure did come down some however patient was asymptomatic and is following up with nephrology on Monday.  She will come back and see [place] in 2 weeks, sooner if needed.  At this time she denies chest pain, shortness of breath, palpitations, diaphoresis, syncope, PND, edema, orthopnea.  Past Medical History:  Diagnosis Date   Anemia    Anxiety    Arthritis    Diabetes mellitus without complication (HCC)    type 2   Heart murmur    echo 07/02/20: Mild MR/TR, mild-moderate AV sclerosis, bicuspid or functional bicuspid AV, no evidence of AS. Murmr felt due to AV.   History of blood transfusion    Hyperlipidemia    Hypertension    Pneumonia    PONV (postoperative nausea and vomiting)    Renal disorder    M-W-F   Past Surgical History:  Procedure Laterality Date   AV FISTULA PLACEMENT Left 05/20/2020   Procedure: INSERTION OF LEFT ARM ARTERIOVENOUS (AV) FISTULA;  Surgeon: Marty Heck, MD;  Location: [place];  Service: Vascular;  Laterality: Left;   [place] Left 08/05/2020   Procedure: SECOND STAGE BASILIC VEIN TRANSPOSITION;  Surgeon:  Marty Heck, MD;  Location: [place];  Service: Vascular;  Laterality: Left;   BIOPSY  09/22/2021   Procedure: BIOPSY;  Surgeon: Ladene Artist, MD;  Location: [facility] ENDOSCOPY;  Service: Gastroenterology;;   CESAREAN SECTION     CHOLECYSTECTOMY     ESOPHAGOGASTRODUODENOSCOPY (EGD) WITH PROPOFOL N/A 09/22/2021   Procedure: ESOPHAGOGASTRODUODENOSCOPY (EGD) WITH PROPOFOL;  Surgeon: Ladene Artist, MD;  Location: [facility] ENDOSCOPY;  Service: Gastroenterology;  Laterality: N/A;   INSERTION OF DIALYSIS CATHETER N/A 05/20/2020   Procedure: INSERTION OF DIALYSIS CATHETER;  Surgeon: Marty Heck, MD;  Location: [place];  Service: Vascular;  Laterality: N/A;   VIDEO BRONCHOSCOPY Bilateral 09/26/2021   Procedure: VIDEO BRONCHOSCOPY WITHOUT FLUORO;  Surgeon: Candee Furbish, MD;  Location: [facility] ENDOSCOPY;  Service: Pulmonary;  Laterality: Bilateral;   Family History  Problem Relation Age of Onset   Diabetes Mother    Hypertension Father    Diabetes Sister    Diabetes Brother    Diabetes Brother     Social History   Tobacco Use   Smoking status: Never   Smokeless tobacco: Never  Substance Use Topics   Alcohol use: Never   Marital Status: Married  ROS  Review of Systems  Constitutional: Negative.  Cardiovascular: Negative.   Respiratory: Negative.    Gastrointestinal: Negative.   Neurological: Negative.   Objective  Blood pressure (!) 205/90,  pulse 72, temperature 98 F (36.7 C), resp. rate 16, height 5' (1.524 m), weight 143 lb (64.9 kg), SpO2 100 %. Body mass index is 27.93 kg/m.     01/09/2022    1:09 PM 01/09/2022    1:01 PM 01/04/2022    3:00 PM  Vitals with BMI  Height  5\' 0"    Weight  143 lbs   BMI  47.42   Systolic 595 638 756  Diastolic 90 87 80  Pulse 72 74      Physical Exam Constitutional:      Appearance: Normal appearance.  HENT:     Head: Normocephalic and atraumatic.  Neck:     Vascular: No carotid bruit.  Cardiovascular:     Rate and Rhythm: Normal rate  and regular rhythm.     Pulses: Normal pulses.     Heart sounds: Murmur heard.     No friction rub. No gallop.  Pulmonary:     Effort: Pulmonary effort is normal.     Breath sounds: Normal breath sounds.  Abdominal:     General: Abdomen is flat. Bowel sounds are normal.     Palpations: Abdomen is soft.  Musculoskeletal:     Right lower leg: No edema.     Left lower leg: No edema.  Skin:    General: Skin is warm and dry.  Neurological:     Mental Status: She is alert.   Medications and allergies  No Known Allergies   Medication list after today's encounter   Current Outpatient Medications:    acetaminophen (TYLENOL) 325 MG tablet, Take 2 tablets (650 mg total) by mouth every 6 (six) hours as needed for mild pain or moderate pain., Disp: 30 tablet, Rfl: 0   calcium acetate (PHOSLO) 667 MG capsule, Take 667 mg by mouth 3 (three) times daily., Disp: , Rfl:    carvedilol (COREG) 25 MG tablet, Take 1 tablet (25 mg total) by mouth 2 (two) times daily with a meal., Disp: 60 tablet, Rfl: 2   gabapentin (NEURONTIN) 100 MG capsule, Take 100 mg by mouth at bedtime., Disp: , Rfl:    isosorbide-hydrALAZINE (BIDIL) 20-37.5 MG tablet, Take 1 tablet by mouth 3 (three) times daily., Disp: 90 tablet, Rfl: 2   linagliptin (TRADJENTA) 5 MG TABS tablet, Take 5 mg by mouth daily., Disp: , Rfl:    nitroGLYCERIN (NITROSTAT) 0.4 MG SL tablet, Place 1 tablet (0.4 mg total) under the tongue every 5 (five) minutes as needed for chest pain., Disp: 90 tablet, Rfl: 3   olopatadine (PATANOL) 0.1 % ophthalmic solution, Place 1 drop into both eyes 2 (two) times daily as needed for allergies., Disp: , Rfl:    omeprazole (PRILOSEC OTC) 20 MG tablet, Take 20 mg by mouth daily as needed (indigestion)., Disp: , Rfl:    ondansetron (ZOFRAN-ODT) 4 MG disintegrating tablet, Take 1 tablet (4 mg total) by mouth every 8 (eight) hours as needed for nausea or vomiting., Disp: 10 tablet, Rfl: 0  Laboratory examination:   Lab  Results  Component Value Date   NA 134 (L) 01/03/2022   K 5.4 (H) 01/03/2022   CO2 27 01/03/2022   GLUCOSE 94 01/03/2022   BUN 35 (H) 01/03/2022   CREATININE 5.76 (H) 01/03/2022   CALCIUM 8.6 (L) 01/03/2022   GFRNONAA 8 (L) 01/03/2022       Latest Ref Rng & Units 01/03/2022    5:36 AM 01/02/2022    6:28 PM 11/02/2021   11:46 AM  CMP  Glucose 70 - 99 mg/dL 94  94  88   BUN 8 - 23 mg/dL 35  28  33   Creatinine 0.44 - 1.00 mg/dL 5.76  4.83  7.67   Sodium 135 - 145 mmol/L 134  135  138   Potassium 3.5 - 5.1 mmol/L 5.4  4.9  5.9   Chloride 98 - 111 mmol/L 96  96  97   CO2 22 - 32 mmol/L 27  27  29    Calcium 8.9 - 10.3 mg/dL 8.6  8.8  9.6   Total Protein 6.5 - 8.1 g/dL 6.7  8.0  7.7   Total Bilirubin 0.3 - 1.2 mg/dL 0.8  0.9  0.7   Alkaline Phos 38 - 126 U/L 56  69  82   AST 15 - 41 U/L 16  20  25    ALT 0 - 44 U/L 13  14  12        Latest Ref Rng & Units 01/03/2022    5:36 AM 01/02/2022    6:28 PM 11/02/2021   11:46 AM  CBC  WBC 4.0 - 10.5 K/uL 9.0  8.0  10.1   Hemoglobin 12.0 - 15.0 g/dL 13.8  15.2  11.8   Hematocrit 36.0 - 46.0 % 43.4  48.2  37.9   Platelets 150 - 400 K/uL 150  150  143     Lipid Panel No results for input(s): "CHOL", "TRIG", "[place]", "VLDL", "HDL", "CHOLHDL", "LDLDIRECT" in the last 8760 hours.  HEMOGLOBIN A1C Lab Results  Component Value Date   HGBA1C 4.5 (L) 09/21/2021   MPG 82.45 09/21/2021   TSH No results for input(s): "TSH" in the last 8760 hours.  External labs:     Radiology:    Cardiac Studies:   No results found for this or any previous visit from the past 1095 days.     No results found for this or any previous visit from the past 1095 days.     EKG:   01/09/2022 EKG normal sinus rhythm with LVH  Assessment     ICD-10-CM   1. Essential hypertension  I10 EKG 12-Lead    PCV ECHOCARDIOGRAM COMPLETE    PCV MYOCARDIAL PERFUSION WO LEXISCAN    2. ESRD (end stage renal disease) ([place])  N18.6     3. Mixed hyperlipidemia  E78.2         Orders Placed This Encounter  Procedures   PCV MYOCARDIAL PERFUSION WO LEXISCAN    Standing Status:   Future    Standing Expiration Date:   03/11/2022   EKG 12-Lead   PCV ECHOCARDIOGRAM COMPLETE    Standing Status:   Future    Standing Expiration Date:   01/10/2023    Meds ordered this encounter  Medications   nitroGLYCERIN (NITROSTAT) 0.4 MG SL tablet    Sig: Place 1 tablet (0.4 mg total) under the tongue every 5 (five) minutes as needed for chest pain.    Dispense:  90 tablet    Refill:  3    Medications Discontinued During This Encounter  Medication Reason   amLODipine (NORVASC) 10 MG tablet    atorvastatin (LIPITOR) 20 MG tablet    diclofenac Sodium (VOLTAREN) 1 % GEL    losartan (COZAAR) 100 MG tablet    ofloxacin (OCUFLOX) 0.3 % ophthalmic solution    sitaGLIPtin (JANUVIA) 25 MG tablet      Recommendations:   Marchetta Navratil de Guinevere Burke is a 66 y.o. female with hypertension and end-stage renal disease  Continue current cardiac medications: coreg and bidil. Encourage low-sodium diet, less than 2000 mg daily. Schedule imaging tests in office - echo and stress test. Sublingual nitroglycerin script sent. Follow-up in 2 weeks or sooner if needed.  1 sublingual nitro tablet given in office.    Floydene Flock, DO  01/09/2022, 3:00 PM Office: [phone] Pager: [phone]

## 2022-01-09 NOTE — Patient Instructions (Addendum)
Continue current cardiac medications: coreg and bidil. Encourage low-sodium diet, less than 2000 mg daily. Schedule imaging tests in office - echo and stress test. Sublingual nitroglycerin script sent. Follow-up in 2 weeks or sooner if needed.

## 2022-01-12 DIAGNOSIS — I10 Essential (primary) hypertension: Secondary | ICD-10-CM | POA: Diagnosis not present

## 2022-01-12 DIAGNOSIS — R197 Diarrhea, unspecified: Secondary | ICD-10-CM | POA: Diagnosis not present

## 2022-01-12 DIAGNOSIS — E1122 Type 2 diabetes mellitus with diabetic chronic kidney disease: Secondary | ICD-10-CM | POA: Diagnosis not present

## 2022-01-12 DIAGNOSIS — N2581 Secondary hyperparathyroidism of renal origin: Secondary | ICD-10-CM | POA: Diagnosis not present

## 2022-01-12 DIAGNOSIS — R110 Nausea: Secondary | ICD-10-CM | POA: Diagnosis not present

## 2022-01-12 DIAGNOSIS — D509 Iron deficiency anemia, unspecified: Secondary | ICD-10-CM | POA: Diagnosis not present

## 2022-01-12 DIAGNOSIS — K5909 Other constipation: Secondary | ICD-10-CM | POA: Diagnosis not present

## 2022-01-12 DIAGNOSIS — N186 End stage renal disease: Secondary | ICD-10-CM | POA: Diagnosis not present

## 2022-01-12 DIAGNOSIS — E782 Mixed hyperlipidemia: Secondary | ICD-10-CM | POA: Diagnosis not present

## 2022-01-12 DIAGNOSIS — K219 Gastro-esophageal reflux disease without esophagitis: Secondary | ICD-10-CM | POA: Diagnosis not present

## 2022-01-12 DIAGNOSIS — R011 Cardiac murmur, unspecified: Secondary | ICD-10-CM | POA: Diagnosis not present

## 2022-01-12 DIAGNOSIS — R202 Paresthesia of skin: Secondary | ICD-10-CM | POA: Diagnosis not present

## 2022-01-12 DIAGNOSIS — D631 Anemia in chronic kidney disease: Secondary | ICD-10-CM | POA: Diagnosis not present

## 2022-01-12 DIAGNOSIS — N8110 Cystocele, unspecified: Secondary | ICD-10-CM | POA: Diagnosis not present

## 2022-01-12 DIAGNOSIS — Z992 Dependence on renal dialysis: Secondary | ICD-10-CM | POA: Diagnosis not present

## 2022-01-13 DIAGNOSIS — N186 End stage renal disease: Secondary | ICD-10-CM | POA: Diagnosis not present

## 2022-01-13 DIAGNOSIS — R110 Nausea: Secondary | ICD-10-CM | POA: Diagnosis not present

## 2022-01-13 DIAGNOSIS — R197 Diarrhea, unspecified: Secondary | ICD-10-CM | POA: Diagnosis not present

## 2022-01-13 DIAGNOSIS — E8770 Fluid overload, unspecified: Secondary | ICD-10-CM | POA: Diagnosis not present

## 2022-01-13 DIAGNOSIS — Z992 Dependence on renal dialysis: Secondary | ICD-10-CM | POA: Diagnosis not present

## 2022-01-13 DIAGNOSIS — E1122 Type 2 diabetes mellitus with diabetic chronic kidney disease: Secondary | ICD-10-CM | POA: Diagnosis not present

## 2022-01-13 DIAGNOSIS — D509 Iron deficiency anemia, unspecified: Secondary | ICD-10-CM | POA: Diagnosis not present

## 2022-01-13 DIAGNOSIS — N2581 Secondary hyperparathyroidism of renal origin: Secondary | ICD-10-CM | POA: Diagnosis not present

## 2022-01-13 DIAGNOSIS — D631 Anemia in chronic kidney disease: Secondary | ICD-10-CM | POA: Diagnosis not present

## 2022-01-13 DIAGNOSIS — I10 Essential (primary) hypertension: Secondary | ICD-10-CM | POA: Diagnosis not present

## 2022-01-14 DIAGNOSIS — D631 Anemia in chronic kidney disease: Secondary | ICD-10-CM | POA: Diagnosis not present

## 2022-01-14 DIAGNOSIS — N2581 Secondary hyperparathyroidism of renal origin: Secondary | ICD-10-CM | POA: Diagnosis not present

## 2022-01-14 DIAGNOSIS — R110 Nausea: Secondary | ICD-10-CM | POA: Diagnosis not present

## 2022-01-14 DIAGNOSIS — I10 Essential (primary) hypertension: Secondary | ICD-10-CM | POA: Diagnosis not present

## 2022-01-14 DIAGNOSIS — N186 End stage renal disease: Secondary | ICD-10-CM | POA: Diagnosis not present

## 2022-01-14 DIAGNOSIS — Z992 Dependence on renal dialysis: Secondary | ICD-10-CM | POA: Diagnosis not present

## 2022-01-14 DIAGNOSIS — R197 Diarrhea, unspecified: Secondary | ICD-10-CM | POA: Diagnosis not present

## 2022-01-14 DIAGNOSIS — E1122 Type 2 diabetes mellitus with diabetic chronic kidney disease: Secondary | ICD-10-CM | POA: Diagnosis not present

## 2022-01-14 DIAGNOSIS — D509 Iron deficiency anemia, unspecified: Secondary | ICD-10-CM | POA: Diagnosis not present

## 2022-01-16 DIAGNOSIS — Z992 Dependence on renal dialysis: Secondary | ICD-10-CM | POA: Diagnosis not present

## 2022-01-16 DIAGNOSIS — E1122 Type 2 diabetes mellitus with diabetic chronic kidney disease: Secondary | ICD-10-CM | POA: Diagnosis not present

## 2022-01-16 DIAGNOSIS — N186 End stage renal disease: Secondary | ICD-10-CM | POA: Diagnosis not present

## 2022-01-16 DIAGNOSIS — R110 Nausea: Secondary | ICD-10-CM | POA: Diagnosis not present

## 2022-01-16 DIAGNOSIS — N2581 Secondary hyperparathyroidism of renal origin: Secondary | ICD-10-CM | POA: Diagnosis not present

## 2022-01-16 DIAGNOSIS — D509 Iron deficiency anemia, unspecified: Secondary | ICD-10-CM | POA: Diagnosis not present

## 2022-01-16 DIAGNOSIS — R197 Diarrhea, unspecified: Secondary | ICD-10-CM | POA: Diagnosis not present

## 2022-01-16 DIAGNOSIS — I10 Essential (primary) hypertension: Secondary | ICD-10-CM | POA: Diagnosis not present

## 2022-01-16 DIAGNOSIS — D631 Anemia in chronic kidney disease: Secondary | ICD-10-CM | POA: Diagnosis not present

## 2022-01-19 DIAGNOSIS — Z992 Dependence on renal dialysis: Secondary | ICD-10-CM | POA: Diagnosis not present

## 2022-01-19 DIAGNOSIS — R197 Diarrhea, unspecified: Secondary | ICD-10-CM | POA: Diagnosis not present

## 2022-01-19 DIAGNOSIS — D631 Anemia in chronic kidney disease: Secondary | ICD-10-CM | POA: Diagnosis not present

## 2022-01-19 DIAGNOSIS — I10 Essential (primary) hypertension: Secondary | ICD-10-CM | POA: Diagnosis not present

## 2022-01-19 DIAGNOSIS — D509 Iron deficiency anemia, unspecified: Secondary | ICD-10-CM | POA: Diagnosis not present

## 2022-01-19 DIAGNOSIS — E1122 Type 2 diabetes mellitus with diabetic chronic kidney disease: Secondary | ICD-10-CM | POA: Diagnosis not present

## 2022-01-19 DIAGNOSIS — N186 End stage renal disease: Secondary | ICD-10-CM | POA: Diagnosis not present

## 2022-01-19 DIAGNOSIS — N2581 Secondary hyperparathyroidism of renal origin: Secondary | ICD-10-CM | POA: Diagnosis not present

## 2022-01-19 DIAGNOSIS — R110 Nausea: Secondary | ICD-10-CM | POA: Diagnosis not present

## 2022-01-20 DIAGNOSIS — G43909 Migraine, unspecified, not intractable, without status migrainosus: Secondary | ICD-10-CM | POA: Diagnosis not present

## 2022-01-20 DIAGNOSIS — R42 Dizziness and giddiness: Secondary | ICD-10-CM | POA: Diagnosis not present

## 2022-01-21 ENCOUNTER — Encounter: Payer: Self-pay | Admitting: Neurology

## 2022-01-21 DIAGNOSIS — I10 Essential (primary) hypertension: Secondary | ICD-10-CM | POA: Diagnosis not present

## 2022-01-21 DIAGNOSIS — Z992 Dependence on renal dialysis: Secondary | ICD-10-CM | POA: Diagnosis not present

## 2022-01-21 DIAGNOSIS — D631 Anemia in chronic kidney disease: Secondary | ICD-10-CM | POA: Diagnosis not present

## 2022-01-21 DIAGNOSIS — R197 Diarrhea, unspecified: Secondary | ICD-10-CM | POA: Diagnosis not present

## 2022-01-21 DIAGNOSIS — R110 Nausea: Secondary | ICD-10-CM | POA: Diagnosis not present

## 2022-01-21 DIAGNOSIS — E1122 Type 2 diabetes mellitus with diabetic chronic kidney disease: Secondary | ICD-10-CM | POA: Diagnosis not present

## 2022-01-21 DIAGNOSIS — N2581 Secondary hyperparathyroidism of renal origin: Secondary | ICD-10-CM | POA: Diagnosis not present

## 2022-01-21 DIAGNOSIS — D509 Iron deficiency anemia, unspecified: Secondary | ICD-10-CM | POA: Diagnosis not present

## 2022-01-21 DIAGNOSIS — N186 End stage renal disease: Secondary | ICD-10-CM | POA: Diagnosis not present

## 2022-01-22 ENCOUNTER — Encounter: Payer: Self-pay | Admitting: Internal Medicine

## 2022-01-22 ENCOUNTER — Ambulatory Visit: Payer: Medicare Other | Admitting: Internal Medicine

## 2022-01-22 VITALS — BP 171/76 | HR 70 | Temp 97.7°F | Resp 16 | Ht 60.0 in | Wt 149.0 lb

## 2022-01-22 DIAGNOSIS — N184 Chronic kidney disease, stage 4 (severe): Secondary | ICD-10-CM | POA: Insufficient documentation

## 2022-01-22 DIAGNOSIS — E119 Type 2 diabetes mellitus without complications: Secondary | ICD-10-CM

## 2022-01-22 DIAGNOSIS — I10 Essential (primary) hypertension: Secondary | ICD-10-CM | POA: Diagnosis not present

## 2022-01-22 MED ORDER — LOSARTAN POTASSIUM 100 MG PO TABS: 100.0000 mg | ORAL_TABLET | Freq: Every day | ORAL | 2 refills | Status: DC

## 2022-01-22 NOTE — Progress Notes (Signed)
Primary Physician/Referring:  Trey Sailors, PA  Patient ID: Rachael Burke, female    DOB: 06-May-1956, 66 y.o.   MRN: [id number]  No chief complaint on file.  HPI:    Rachael Burke  is a 66 y.o. female with past medical history significant for hypertension, hyperlipidemia, and end-stage renal disease who is here for follow-up visit. Her blood pressure is still elevated but much better controlled since last visit. Patient has been feeling well. She just got dialysis yesterday. At this time she denies chest pain, shortness of breath, palpitations, diaphoresis, syncope, PND, edema, orthopnea.  Past Medical History:  Diagnosis Date   Anemia    Anxiety    Arthritis    Diabetes mellitus without complication (HCC)    type 2   Heart murmur    echo 07/02/20: Mild MR/TR, mild-moderate AV sclerosis, bicuspid or functional bicuspid AV, no evidence of AS. Murmr felt due to AV.   History of blood transfusion    Hyperlipidemia    Hypertension    Pneumonia    PONV (postoperative nausea and vomiting)    Renal disorder    M-W-F   Past Surgical History:  Procedure Laterality Date   AV FISTULA PLACEMENT Left 05/20/2020   Procedure: INSERTION OF LEFT ARM ARTERIOVENOUS (AV) FISTULA;  Surgeon: Marty Heck, MD;  Location: [place];  Service: Vascular;  Laterality: Left;   [place] Left 08/05/2020   Procedure: SECOND STAGE BASILIC VEIN TRANSPOSITION;  Surgeon: Marty Heck, MD;  Location: [place];  Service: Vascular;  Laterality: Left;   BIOPSY  09/22/2021   Procedure: BIOPSY;  Surgeon: Ladene Artist, MD;  Location: [facility] ENDOSCOPY;  Service: Gastroenterology;;   CESAREAN SECTION     CHOLECYSTECTOMY     ESOPHAGOGASTRODUODENOSCOPY (EGD) WITH PROPOFOL N/A 09/22/2021   Procedure: ESOPHAGOGASTRODUODENOSCOPY (EGD) WITH PROPOFOL;  Surgeon: Ladene Artist, MD;  Location: [facility] ENDOSCOPY;  Service: Gastroenterology;  Laterality: N/A;   INSERTION OF DIALYSIS  CATHETER N/A 05/20/2020   Procedure: INSERTION OF DIALYSIS CATHETER;  Surgeon: Marty Heck, MD;  Location: [place];  Service: Vascular;  Laterality: N/A;   VIDEO BRONCHOSCOPY Bilateral 09/26/2021   Procedure: VIDEO BRONCHOSCOPY WITHOUT FLUORO;  Surgeon: Candee Furbish, MD;  Location: [facility] ENDOSCOPY;  Service: Pulmonary;  Laterality: Bilateral;   Family History  Problem Relation Age of Onset   Diabetes Mother    Hypertension Father    Diabetes Sister    Diabetes Brother    Diabetes Brother     Social History   Tobacco Use   Smoking status: Never   Smokeless tobacco: Never  Substance Use Topics   Alcohol use: Never   Marital Status: Married  ROS  Review of Systems  Constitutional: Negative.  Cardiovascular: Negative.  Negative for chest pain, dyspnea on exertion, leg swelling, orthopnea and palpitations.  Respiratory: Negative.    Gastrointestinal: Negative.   Neurological: Negative.    Objective  Blood pressure (!) 171/76, pulse 70, temperature 97.7 F (36.5 C), temperature source Temporal, resp. rate 16, height 5' (1.524 m), weight 149 lb (67.6 kg), SpO2 96 %. Body mass index is 29.1 kg/m.     01/22/2022   11:56 AM 01/22/2022   11:53 AM 01/09/2022    1:09 PM  Vitals with BMI  Height  5\' 0"    Weight  149 lbs   BMI  73.5   Systolic 329 924 268  Diastolic 76 84 90  Pulse 70 71 72  Physical Exam Vitals reviewed.  Constitutional:      Appearance: Normal appearance.  HENT:     Head: Normocephalic and atraumatic.  Neck:     Vascular: No carotid bruit.  Cardiovascular:     Rate and Rhythm: Normal rate and regular rhythm.     Pulses: Normal pulses.     Heart sounds: Murmur heard.     No friction rub. No gallop.  Pulmonary:     Effort: Pulmonary effort is normal.     Breath sounds: Normal breath sounds.  Abdominal:     General: Abdomen is flat. Bowel sounds are normal.     Palpations: Abdomen is soft.  Musculoskeletal:     Right lower leg: No edema.      Left lower leg: No edema.  Skin:    General: Skin is warm and dry.  Neurological:     Mental Status: She is alert.    Medications and allergies  No Known Allergies   Medication list after today's encounter   Current Outpatient Medications:    acetaminophen (TYLENOL) 325 MG tablet, Take 2 tablets (650 mg total) by mouth every 6 (six) hours as needed for mild pain or moderate pain., Disp: 30 tablet, Rfl: 0   amLODipine (NORVASC) 10 MG tablet, Take 1 tablet by mouth daily., Disp: , Rfl:    calcium acetate (PHOSLO) 667 MG capsule, Take 667 mg by mouth 3 (three) times daily., Disp: , Rfl:    carvedilol (COREG) 25 MG tablet, Take 1 tablet (25 mg total) by mouth 2 (two) times daily with a meal., Disp: 60 tablet, Rfl: 2   gabapentin (NEURONTIN) 100 MG capsule, Take 100 mg by mouth at bedtime., Disp: , Rfl:    isosorbide-hydrALAZINE (BIDIL) 20-37.5 MG tablet, Take 1 tablet by mouth 3 (three) times daily., Disp: 90 tablet, Rfl: 2   linagliptin (TRADJENTA) 5 MG TABS tablet, Take 5 mg by mouth daily., Disp: , Rfl:    losartan (COZAAR) 100 MG tablet, Take 1 tablet (100 mg total) by mouth at bedtime., Disp: 30 tablet, Rfl: 2   nitroGLYCERIN (NITROSTAT) 0.4 MG SL tablet, Place 1 tablet (0.4 mg total) under the tongue every 5 (five) minutes as needed for chest pain., Disp: 90 tablet, Rfl: 3   olopatadine (PATANOL) 0.1 % ophthalmic solution, Place 1 drop into both eyes 2 (two) times daily as needed for allergies., Disp: , Rfl:    omeprazole (PRILOSEC OTC) 20 MG tablet, Take 20 mg by mouth daily as needed (indigestion)., Disp: , Rfl:    ondansetron (ZOFRAN-ODT) 4 MG disintegrating tablet, Take 1 tablet (4 mg total) by mouth every 8 (eight) hours as needed for nausea or vomiting., Disp: 10 tablet, Rfl: 0  Laboratory examination:   Lab Results  Component Value Date   NA 134 (L) 01/03/2022   K 5.4 (H) 01/03/2022   CO2 27 01/03/2022   GLUCOSE 94 01/03/2022   BUN 35 (H) 01/03/2022   CREATININE 5.76 (H)  01/03/2022   CALCIUM 8.6 (L) 01/03/2022   GFRNONAA 8 (L) 01/03/2022       Latest Ref Rng & Units 01/03/2022    5:36 AM 01/02/2022    6:28 PM 11/02/2021   11:46 AM  CMP  Glucose 70 - 99 mg/dL 94  94  88   BUN 8 - 23 mg/dL 35  28  33   Creatinine 0.44 - 1.00 mg/dL 5.76  4.83  7.67   Sodium 135 - 145 mmol/L 134  135  138  Potassium 3.5 - 5.1 mmol/L 5.4  4.9  5.9   Chloride 98 - 111 mmol/L 96  96  97   CO2 22 - 32 mmol/L 27  27  29    Calcium 8.9 - 10.3 mg/dL 8.6  8.8  9.6   Total Protein 6.5 - 8.1 g/dL 6.7  8.0  7.7   Total Bilirubin 0.3 - 1.2 mg/dL 0.8  0.9  0.7   Alkaline Phos 38 - 126 U/L 56  69  82   AST 15 - 41 U/L 16  20  25    ALT 0 - 44 U/L 13  14  12        Latest Ref Rng & Units 01/03/2022    5:36 AM 01/02/2022    6:28 PM 11/02/2021   11:46 AM  CBC  WBC 4.0 - 10.5 K/uL 9.0  8.0  10.1   Hemoglobin 12.0 - 15.0 g/dL 13.8  15.2  11.8   Hematocrit 36.0 - 46.0 % 43.4  48.2  37.9   Platelets 150 - 400 K/uL 150  150  143     Lipid Panel No results for input(s): "CHOL", "TRIG", "[place]", "VLDL", "HDL", "CHOLHDL", "LDLDIRECT" in the last 8760 hours.  HEMOGLOBIN A1C Lab Results  Component Value Date   HGBA1C 4.5 (L) 09/21/2021   MPG 82.45 09/21/2021   TSH No results for input(s): "TSH" in the last 8760 hours.  External labs:     Radiology:    Cardiac Studies:   No results found for this or any previous visit from the past 1095 days.     No results found for this or any previous visit from the past 1095 days.     EKG:   01/09/2022 EKG normal sinus rhythm with LVH  Assessment     ICD-10-CM   1. Chronic kidney disease (CKD), stage IV (severe) (HCC)  N18.4     2. Type 2 diabetes mellitus without obesity (HCC)  E11.9     3. Essential hypertension  I10        No orders of the defined types were placed in this encounter.   Meds ordered this encounter  Medications   losartan (COZAAR) 100 MG tablet    Sig: Take 1 tablet (100 mg total) by mouth at bedtime.     Dispense:  30 tablet    Refill:  2    There are no discontinued medications.    Recommendations:   Rachael Burke is a 66 y.o. female with hypertension and end-stage renal disease  Continue current cardiac medications: coreg and bidil. Continue amlodipine for HTN, losartan 100mg  added this visit due to uncontrolled HTN Encourage low-sodium diet, less than 2000 mg daily. Schedule imaging tests in office - echo and stress test - will call patient with results Sublingual nitroglycerin script sent during last visit Follow-up in 3 months or sooner if needed.      Floydene Flock, DO  01/22/2022, 12:18 PM Office: [phone] Pager: [phone]

## 2022-01-23 DIAGNOSIS — D509 Iron deficiency anemia, unspecified: Secondary | ICD-10-CM | POA: Diagnosis not present

## 2022-01-23 DIAGNOSIS — E1122 Type 2 diabetes mellitus with diabetic chronic kidney disease: Secondary | ICD-10-CM | POA: Diagnosis not present

## 2022-01-23 DIAGNOSIS — N186 End stage renal disease: Secondary | ICD-10-CM | POA: Diagnosis not present

## 2022-01-23 DIAGNOSIS — I10 Essential (primary) hypertension: Secondary | ICD-10-CM | POA: Diagnosis not present

## 2022-01-23 DIAGNOSIS — R197 Diarrhea, unspecified: Secondary | ICD-10-CM | POA: Diagnosis not present

## 2022-01-23 DIAGNOSIS — N2581 Secondary hyperparathyroidism of renal origin: Secondary | ICD-10-CM | POA: Diagnosis not present

## 2022-01-23 DIAGNOSIS — D631 Anemia in chronic kidney disease: Secondary | ICD-10-CM | POA: Diagnosis not present

## 2022-01-23 DIAGNOSIS — Z992 Dependence on renal dialysis: Secondary | ICD-10-CM | POA: Diagnosis not present

## 2022-01-23 DIAGNOSIS — R110 Nausea: Secondary | ICD-10-CM | POA: Diagnosis not present

## 2022-01-26 DIAGNOSIS — D631 Anemia in chronic kidney disease: Secondary | ICD-10-CM | POA: Diagnosis not present

## 2022-01-26 DIAGNOSIS — N186 End stage renal disease: Secondary | ICD-10-CM | POA: Diagnosis not present

## 2022-01-26 DIAGNOSIS — I10 Essential (primary) hypertension: Secondary | ICD-10-CM | POA: Diagnosis not present

## 2022-01-26 DIAGNOSIS — N2581 Secondary hyperparathyroidism of renal origin: Secondary | ICD-10-CM | POA: Diagnosis not present

## 2022-01-26 DIAGNOSIS — R110 Nausea: Secondary | ICD-10-CM | POA: Diagnosis not present

## 2022-01-26 DIAGNOSIS — D509 Iron deficiency anemia, unspecified: Secondary | ICD-10-CM | POA: Diagnosis not present

## 2022-01-26 DIAGNOSIS — R197 Diarrhea, unspecified: Secondary | ICD-10-CM | POA: Diagnosis not present

## 2022-01-26 DIAGNOSIS — E1122 Type 2 diabetes mellitus with diabetic chronic kidney disease: Secondary | ICD-10-CM | POA: Diagnosis not present

## 2022-01-26 DIAGNOSIS — Z992 Dependence on renal dialysis: Secondary | ICD-10-CM | POA: Diagnosis not present

## 2022-01-27 DIAGNOSIS — H43392 Other vitreous opacities, left eye: Secondary | ICD-10-CM | POA: Diagnosis not present

## 2022-01-27 DIAGNOSIS — H4322 Crystalline deposits in vitreous body, left eye: Secondary | ICD-10-CM | POA: Diagnosis not present

## 2022-01-28 DIAGNOSIS — Z992 Dependence on renal dialysis: Secondary | ICD-10-CM | POA: Diagnosis not present

## 2022-01-28 DIAGNOSIS — D509 Iron deficiency anemia, unspecified: Secondary | ICD-10-CM | POA: Diagnosis not present

## 2022-01-28 DIAGNOSIS — D631 Anemia in chronic kidney disease: Secondary | ICD-10-CM | POA: Diagnosis not present

## 2022-01-28 DIAGNOSIS — N186 End stage renal disease: Secondary | ICD-10-CM | POA: Diagnosis not present

## 2022-01-28 DIAGNOSIS — R197 Diarrhea, unspecified: Secondary | ICD-10-CM | POA: Diagnosis not present

## 2022-01-28 DIAGNOSIS — E1122 Type 2 diabetes mellitus with diabetic chronic kidney disease: Secondary | ICD-10-CM | POA: Diagnosis not present

## 2022-01-28 DIAGNOSIS — R110 Nausea: Secondary | ICD-10-CM | POA: Diagnosis not present

## 2022-01-28 DIAGNOSIS — I10 Essential (primary) hypertension: Secondary | ICD-10-CM | POA: Diagnosis not present

## 2022-01-28 DIAGNOSIS — N2581 Secondary hyperparathyroidism of renal origin: Secondary | ICD-10-CM | POA: Diagnosis not present

## 2022-01-29 DIAGNOSIS — N186 End stage renal disease: Secondary | ICD-10-CM | POA: Diagnosis not present

## 2022-01-29 DIAGNOSIS — Z992 Dependence on renal dialysis: Secondary | ICD-10-CM | POA: Diagnosis not present

## 2022-01-29 DIAGNOSIS — E1122 Type 2 diabetes mellitus with diabetic chronic kidney disease: Secondary | ICD-10-CM | POA: Diagnosis not present

## 2022-01-30 DIAGNOSIS — R110 Nausea: Secondary | ICD-10-CM | POA: Diagnosis not present

## 2022-01-30 DIAGNOSIS — N2581 Secondary hyperparathyroidism of renal origin: Secondary | ICD-10-CM | POA: Diagnosis not present

## 2022-01-30 DIAGNOSIS — D631 Anemia in chronic kidney disease: Secondary | ICD-10-CM | POA: Diagnosis not present

## 2022-01-30 DIAGNOSIS — E1122 Type 2 diabetes mellitus with diabetic chronic kidney disease: Secondary | ICD-10-CM | POA: Diagnosis not present

## 2022-01-30 DIAGNOSIS — Z992 Dependence on renal dialysis: Secondary | ICD-10-CM | POA: Diagnosis not present

## 2022-01-30 DIAGNOSIS — R197 Diarrhea, unspecified: Secondary | ICD-10-CM | POA: Diagnosis not present

## 2022-01-30 DIAGNOSIS — D509 Iron deficiency anemia, unspecified: Secondary | ICD-10-CM | POA: Diagnosis not present

## 2022-01-30 DIAGNOSIS — N186 End stage renal disease: Secondary | ICD-10-CM | POA: Diagnosis not present

## 2022-02-02 DIAGNOSIS — N2581 Secondary hyperparathyroidism of renal origin: Secondary | ICD-10-CM | POA: Diagnosis not present

## 2022-02-02 DIAGNOSIS — Z992 Dependence on renal dialysis: Secondary | ICD-10-CM | POA: Diagnosis not present

## 2022-02-02 DIAGNOSIS — D631 Anemia in chronic kidney disease: Secondary | ICD-10-CM | POA: Diagnosis not present

## 2022-02-02 DIAGNOSIS — E1122 Type 2 diabetes mellitus with diabetic chronic kidney disease: Secondary | ICD-10-CM | POA: Diagnosis not present

## 2022-02-02 DIAGNOSIS — D509 Iron deficiency anemia, unspecified: Secondary | ICD-10-CM | POA: Diagnosis not present

## 2022-02-02 DIAGNOSIS — N186 End stage renal disease: Secondary | ICD-10-CM | POA: Diagnosis not present

## 2022-02-02 DIAGNOSIS — R110 Nausea: Secondary | ICD-10-CM | POA: Diagnosis not present

## 2022-02-02 DIAGNOSIS — R197 Diarrhea, unspecified: Secondary | ICD-10-CM | POA: Diagnosis not present

## 2022-02-03 ENCOUNTER — Other Ambulatory Visit: Payer: Medicare Other

## 2022-02-04 DIAGNOSIS — Z992 Dependence on renal dialysis: Secondary | ICD-10-CM | POA: Diagnosis not present

## 2022-02-04 DIAGNOSIS — N2581 Secondary hyperparathyroidism of renal origin: Secondary | ICD-10-CM | POA: Diagnosis not present

## 2022-02-04 DIAGNOSIS — D509 Iron deficiency anemia, unspecified: Secondary | ICD-10-CM | POA: Diagnosis not present

## 2022-02-04 DIAGNOSIS — N186 End stage renal disease: Secondary | ICD-10-CM | POA: Diagnosis not present

## 2022-02-04 DIAGNOSIS — R197 Diarrhea, unspecified: Secondary | ICD-10-CM | POA: Diagnosis not present

## 2022-02-04 DIAGNOSIS — R110 Nausea: Secondary | ICD-10-CM | POA: Diagnosis not present

## 2022-02-04 DIAGNOSIS — D631 Anemia in chronic kidney disease: Secondary | ICD-10-CM | POA: Diagnosis not present

## 2022-02-04 DIAGNOSIS — E1122 Type 2 diabetes mellitus with diabetic chronic kidney disease: Secondary | ICD-10-CM | POA: Diagnosis not present

## 2022-02-06 DIAGNOSIS — D631 Anemia in chronic kidney disease: Secondary | ICD-10-CM | POA: Diagnosis not present

## 2022-02-06 DIAGNOSIS — Z992 Dependence on renal dialysis: Secondary | ICD-10-CM | POA: Diagnosis not present

## 2022-02-06 DIAGNOSIS — R197 Diarrhea, unspecified: Secondary | ICD-10-CM | POA: Diagnosis not present

## 2022-02-06 DIAGNOSIS — R110 Nausea: Secondary | ICD-10-CM | POA: Diagnosis not present

## 2022-02-06 DIAGNOSIS — D509 Iron deficiency anemia, unspecified: Secondary | ICD-10-CM | POA: Diagnosis not present

## 2022-02-06 DIAGNOSIS — E1122 Type 2 diabetes mellitus with diabetic chronic kidney disease: Secondary | ICD-10-CM | POA: Diagnosis not present

## 2022-02-06 DIAGNOSIS — N2581 Secondary hyperparathyroidism of renal origin: Secondary | ICD-10-CM | POA: Diagnosis not present

## 2022-02-06 DIAGNOSIS — N186 End stage renal disease: Secondary | ICD-10-CM | POA: Diagnosis not present

## 2022-02-09 ENCOUNTER — Ambulatory Visit (HOSPITAL_COMMUNITY)
Admission: RE | Admit: 2022-02-09 | Discharge: 2022-02-09 | Disposition: A | Discharge status: Home / Self Care | Payer: Medicare Other | Source: Ambulatory Visit | Attending: Internal Medicine | Admitting: Internal Medicine

## 2022-02-09 DIAGNOSIS — R911 Solitary pulmonary nodule: Secondary | ICD-10-CM | POA: Diagnosis not present

## 2022-02-09 DIAGNOSIS — D631 Anemia in chronic kidney disease: Secondary | ICD-10-CM | POA: Diagnosis not present

## 2022-02-09 DIAGNOSIS — I700 Atherosclerosis of aorta: Secondary | ICD-10-CM | POA: Diagnosis not present

## 2022-02-09 DIAGNOSIS — I3139 Other pericardial effusion (noninflammatory): Secondary | ICD-10-CM | POA: Diagnosis not present

## 2022-02-09 DIAGNOSIS — R110 Nausea: Secondary | ICD-10-CM | POA: Diagnosis not present

## 2022-02-09 DIAGNOSIS — E1122 Type 2 diabetes mellitus with diabetic chronic kidney disease: Secondary | ICD-10-CM | POA: Diagnosis not present

## 2022-02-09 DIAGNOSIS — R918 Other nonspecific abnormal finding of lung field: Secondary | ICD-10-CM | POA: Diagnosis not present

## 2022-02-09 DIAGNOSIS — R197 Diarrhea, unspecified: Secondary | ICD-10-CM | POA: Diagnosis not present

## 2022-02-09 DIAGNOSIS — N186 End stage renal disease: Secondary | ICD-10-CM | POA: Diagnosis not present

## 2022-02-09 DIAGNOSIS — N2581 Secondary hyperparathyroidism of renal origin: Secondary | ICD-10-CM | POA: Diagnosis not present

## 2022-02-09 DIAGNOSIS — Z992 Dependence on renal dialysis: Secondary | ICD-10-CM | POA: Diagnosis not present

## 2022-02-09 DIAGNOSIS — D509 Iron deficiency anemia, unspecified: Secondary | ICD-10-CM | POA: Diagnosis not present

## 2022-02-10 ENCOUNTER — Ambulatory Visit: Payer: Medicare Other

## 2022-02-10 DIAGNOSIS — I10 Essential (primary) hypertension: Secondary | ICD-10-CM

## 2022-02-10 DIAGNOSIS — N184 Chronic kidney disease, stage 4 (severe): Secondary | ICD-10-CM | POA: Diagnosis not present

## 2022-02-10 DIAGNOSIS — I129 Hypertensive chronic kidney disease with stage 1 through stage 4 chronic kidney disease, or unspecified chronic kidney disease: Secondary | ICD-10-CM | POA: Diagnosis not present

## 2022-02-10 DIAGNOSIS — Z09 Encounter for follow-up examination after completed treatment for conditions other than malignant neoplasm: Secondary | ICD-10-CM | POA: Diagnosis not present

## 2022-02-11 DIAGNOSIS — N186 End stage renal disease: Secondary | ICD-10-CM | POA: Diagnosis not present

## 2022-02-11 DIAGNOSIS — Z992 Dependence on renal dialysis: Secondary | ICD-10-CM | POA: Diagnosis not present

## 2022-02-11 DIAGNOSIS — R197 Diarrhea, unspecified: Secondary | ICD-10-CM | POA: Diagnosis not present

## 2022-02-11 DIAGNOSIS — N2581 Secondary hyperparathyroidism of renal origin: Secondary | ICD-10-CM | POA: Diagnosis not present

## 2022-02-11 DIAGNOSIS — R110 Nausea: Secondary | ICD-10-CM | POA: Diagnosis not present

## 2022-02-11 DIAGNOSIS — E1122 Type 2 diabetes mellitus with diabetic chronic kidney disease: Secondary | ICD-10-CM | POA: Diagnosis not present

## 2022-02-11 DIAGNOSIS — D509 Iron deficiency anemia, unspecified: Secondary | ICD-10-CM | POA: Diagnosis not present

## 2022-02-11 DIAGNOSIS — D631 Anemia in chronic kidney disease: Secondary | ICD-10-CM | POA: Diagnosis not present

## 2022-02-13 DIAGNOSIS — Z992 Dependence on renal dialysis: Secondary | ICD-10-CM | POA: Diagnosis not present

## 2022-02-13 DIAGNOSIS — R110 Nausea: Secondary | ICD-10-CM | POA: Diagnosis not present

## 2022-02-13 DIAGNOSIS — N2581 Secondary hyperparathyroidism of renal origin: Secondary | ICD-10-CM | POA: Diagnosis not present

## 2022-02-13 DIAGNOSIS — E1122 Type 2 diabetes mellitus with diabetic chronic kidney disease: Secondary | ICD-10-CM | POA: Diagnosis not present

## 2022-02-13 DIAGNOSIS — D509 Iron deficiency anemia, unspecified: Secondary | ICD-10-CM | POA: Diagnosis not present

## 2022-02-13 DIAGNOSIS — D631 Anemia in chronic kidney disease: Secondary | ICD-10-CM | POA: Diagnosis not present

## 2022-02-13 DIAGNOSIS — R197 Diarrhea, unspecified: Secondary | ICD-10-CM | POA: Diagnosis not present

## 2022-02-13 DIAGNOSIS — N186 End stage renal disease: Secondary | ICD-10-CM | POA: Diagnosis not present

## 2022-02-13 NOTE — Progress Notes (Signed)
No concerning findings in the lungs on CT chest. 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 Her heart is mildly enlarged. Thanks.

## 2022-02-16 ENCOUNTER — Ambulatory Visit: Payer: Medicare Other

## 2022-02-16 DIAGNOSIS — I10 Essential (primary) hypertension: Secondary | ICD-10-CM | POA: Diagnosis not present

## 2022-02-16 DIAGNOSIS — N2581 Secondary hyperparathyroidism of renal origin: Secondary | ICD-10-CM | POA: Diagnosis not present

## 2022-02-16 DIAGNOSIS — E1122 Type 2 diabetes mellitus with diabetic chronic kidney disease: Secondary | ICD-10-CM | POA: Diagnosis not present

## 2022-02-16 DIAGNOSIS — R197 Diarrhea, unspecified: Secondary | ICD-10-CM | POA: Diagnosis not present

## 2022-02-16 DIAGNOSIS — D631 Anemia in chronic kidney disease: Secondary | ICD-10-CM | POA: Diagnosis not present

## 2022-02-16 DIAGNOSIS — Z992 Dependence on renal dialysis: Secondary | ICD-10-CM | POA: Diagnosis not present

## 2022-02-16 DIAGNOSIS — R110 Nausea: Secondary | ICD-10-CM | POA: Diagnosis not present

## 2022-02-16 DIAGNOSIS — N186 End stage renal disease: Secondary | ICD-10-CM | POA: Diagnosis not present

## 2022-02-16 DIAGNOSIS — D509 Iron deficiency anemia, unspecified: Secondary | ICD-10-CM | POA: Diagnosis not present

## 2022-02-18 DIAGNOSIS — D509 Iron deficiency anemia, unspecified: Secondary | ICD-10-CM | POA: Diagnosis not present

## 2022-02-18 DIAGNOSIS — N186 End stage renal disease: Secondary | ICD-10-CM | POA: Diagnosis not present

## 2022-02-18 DIAGNOSIS — R110 Nausea: Secondary | ICD-10-CM | POA: Diagnosis not present

## 2022-02-18 DIAGNOSIS — R197 Diarrhea, unspecified: Secondary | ICD-10-CM | POA: Diagnosis not present

## 2022-02-18 DIAGNOSIS — E1122 Type 2 diabetes mellitus with diabetic chronic kidney disease: Secondary | ICD-10-CM | POA: Diagnosis not present

## 2022-02-18 DIAGNOSIS — D631 Anemia in chronic kidney disease: Secondary | ICD-10-CM | POA: Diagnosis not present

## 2022-02-18 DIAGNOSIS — N2581 Secondary hyperparathyroidism of renal origin: Secondary | ICD-10-CM | POA: Diagnosis not present

## 2022-02-18 DIAGNOSIS — Z992 Dependence on renal dialysis: Secondary | ICD-10-CM | POA: Diagnosis not present

## 2022-02-20 DIAGNOSIS — N186 End stage renal disease: Secondary | ICD-10-CM | POA: Diagnosis not present

## 2022-02-20 DIAGNOSIS — E1122 Type 2 diabetes mellitus with diabetic chronic kidney disease: Secondary | ICD-10-CM | POA: Diagnosis not present

## 2022-02-20 DIAGNOSIS — R197 Diarrhea, unspecified: Secondary | ICD-10-CM | POA: Diagnosis not present

## 2022-02-20 DIAGNOSIS — D509 Iron deficiency anemia, unspecified: Secondary | ICD-10-CM | POA: Diagnosis not present

## 2022-02-20 DIAGNOSIS — Z992 Dependence on renal dialysis: Secondary | ICD-10-CM | POA: Diagnosis not present

## 2022-02-20 DIAGNOSIS — D631 Anemia in chronic kidney disease: Secondary | ICD-10-CM | POA: Diagnosis not present

## 2022-02-20 DIAGNOSIS — N2581 Secondary hyperparathyroidism of renal origin: Secondary | ICD-10-CM | POA: Diagnosis not present

## 2022-02-20 DIAGNOSIS — R110 Nausea: Secondary | ICD-10-CM | POA: Diagnosis not present

## 2022-02-23 DIAGNOSIS — R197 Diarrhea, unspecified: Secondary | ICD-10-CM | POA: Diagnosis not present

## 2022-02-23 DIAGNOSIS — D509 Iron deficiency anemia, unspecified: Secondary | ICD-10-CM | POA: Diagnosis not present

## 2022-02-23 DIAGNOSIS — Z992 Dependence on renal dialysis: Secondary | ICD-10-CM | POA: Diagnosis not present

## 2022-02-23 DIAGNOSIS — R110 Nausea: Secondary | ICD-10-CM | POA: Diagnosis not present

## 2022-02-23 DIAGNOSIS — E1122 Type 2 diabetes mellitus with diabetic chronic kidney disease: Secondary | ICD-10-CM | POA: Diagnosis not present

## 2022-02-23 DIAGNOSIS — N186 End stage renal disease: Secondary | ICD-10-CM | POA: Diagnosis not present

## 2022-02-23 DIAGNOSIS — N2581 Secondary hyperparathyroidism of renal origin: Secondary | ICD-10-CM | POA: Diagnosis not present

## 2022-02-23 DIAGNOSIS — D631 Anemia in chronic kidney disease: Secondary | ICD-10-CM | POA: Diagnosis not present

## 2022-02-25 DIAGNOSIS — Z992 Dependence on renal dialysis: Secondary | ICD-10-CM | POA: Diagnosis not present

## 2022-02-25 DIAGNOSIS — R110 Nausea: Secondary | ICD-10-CM | POA: Diagnosis not present

## 2022-02-25 DIAGNOSIS — N2581 Secondary hyperparathyroidism of renal origin: Secondary | ICD-10-CM | POA: Diagnosis not present

## 2022-02-25 DIAGNOSIS — D631 Anemia in chronic kidney disease: Secondary | ICD-10-CM | POA: Diagnosis not present

## 2022-02-25 DIAGNOSIS — R197 Diarrhea, unspecified: Secondary | ICD-10-CM | POA: Diagnosis not present

## 2022-02-25 DIAGNOSIS — D509 Iron deficiency anemia, unspecified: Secondary | ICD-10-CM | POA: Diagnosis not present

## 2022-02-25 DIAGNOSIS — N186 End stage renal disease: Secondary | ICD-10-CM | POA: Diagnosis not present

## 2022-02-25 DIAGNOSIS — E1122 Type 2 diabetes mellitus with diabetic chronic kidney disease: Secondary | ICD-10-CM | POA: Diagnosis not present

## 2022-02-27 DIAGNOSIS — R110 Nausea: Secondary | ICD-10-CM | POA: Diagnosis not present

## 2022-02-27 DIAGNOSIS — R197 Diarrhea, unspecified: Secondary | ICD-10-CM | POA: Diagnosis not present

## 2022-02-27 DIAGNOSIS — D631 Anemia in chronic kidney disease: Secondary | ICD-10-CM | POA: Diagnosis not present

## 2022-02-27 DIAGNOSIS — N186 End stage renal disease: Secondary | ICD-10-CM | POA: Diagnosis not present

## 2022-02-27 DIAGNOSIS — N2581 Secondary hyperparathyroidism of renal origin: Secondary | ICD-10-CM | POA: Diagnosis not present

## 2022-02-27 DIAGNOSIS — Z992 Dependence on renal dialysis: Secondary | ICD-10-CM | POA: Diagnosis not present

## 2022-02-27 DIAGNOSIS — D509 Iron deficiency anemia, unspecified: Secondary | ICD-10-CM | POA: Diagnosis not present

## 2022-02-27 DIAGNOSIS — E1122 Type 2 diabetes mellitus with diabetic chronic kidney disease: Secondary | ICD-10-CM | POA: Diagnosis not present

## 2022-02-28 DIAGNOSIS — E1129 Type 2 diabetes mellitus with other diabetic kidney complication: Secondary | ICD-10-CM | POA: Diagnosis not present

## 2022-02-28 DIAGNOSIS — N186 End stage renal disease: Secondary | ICD-10-CM | POA: Diagnosis not present

## 2022-02-28 DIAGNOSIS — Z992 Dependence on renal dialysis: Secondary | ICD-10-CM | POA: Diagnosis not present

## 2022-03-02 ENCOUNTER — Encounter (HOSPITAL_COMMUNITY): Payer: Self-pay | Admitting: Emergency Medicine

## 2022-03-02 ENCOUNTER — Other Ambulatory Visit: Payer: Self-pay

## 2022-03-02 ENCOUNTER — Emergency Department (HOSPITAL_COMMUNITY): Payer: Medicare Other

## 2022-03-02 ENCOUNTER — Emergency Department (HOSPITAL_COMMUNITY)
Admission: EM | Admit: 2022-03-02 | Discharge: 2022-03-02 | Disposition: A | Discharge status: Home / Self Care | Payer: Medicare Other | Attending: Emergency Medicine | Admitting: Emergency Medicine

## 2022-03-02 DIAGNOSIS — R4182 Altered mental status, unspecified: Secondary | ICD-10-CM | POA: Diagnosis not present

## 2022-03-02 DIAGNOSIS — E119 Type 2 diabetes mellitus without complications: Secondary | ICD-10-CM | POA: Diagnosis not present

## 2022-03-02 DIAGNOSIS — R531 Weakness: Secondary | ICD-10-CM | POA: Diagnosis not present

## 2022-03-02 DIAGNOSIS — Z992 Dependence on renal dialysis: Secondary | ICD-10-CM | POA: Insufficient documentation

## 2022-03-02 DIAGNOSIS — I120 Hypertensive chronic kidney disease with stage 5 chronic kidney disease or end stage renal disease: Secondary | ICD-10-CM | POA: Diagnosis not present

## 2022-03-02 DIAGNOSIS — Z79899 Other long term (current) drug therapy: Secondary | ICD-10-CM | POA: Insufficient documentation

## 2022-03-02 DIAGNOSIS — Z20822 Contact with and (suspected) exposure to covid-19: Secondary | ICD-10-CM | POA: Diagnosis not present

## 2022-03-02 DIAGNOSIS — N186 End stage renal disease: Secondary | ICD-10-CM | POA: Diagnosis not present

## 2022-03-02 DIAGNOSIS — Z743 Need for continuous supervision: Secondary | ICD-10-CM | POA: Diagnosis not present

## 2022-03-02 DIAGNOSIS — R001 Bradycardia, unspecified: Secondary | ICD-10-CM | POA: Diagnosis not present

## 2022-03-02 DIAGNOSIS — R739 Hyperglycemia, unspecified: Secondary | ICD-10-CM | POA: Diagnosis not present

## 2022-03-02 DIAGNOSIS — R112 Nausea with vomiting, unspecified: Secondary | ICD-10-CM | POA: Insufficient documentation

## 2022-03-02 DIAGNOSIS — I499 Cardiac arrhythmia, unspecified: Secondary | ICD-10-CM | POA: Diagnosis not present

## 2022-03-02 DIAGNOSIS — R110 Nausea: Secondary | ICD-10-CM | POA: Diagnosis not present

## 2022-03-02 DIAGNOSIS — E875 Hyperkalemia: Secondary | ICD-10-CM | POA: Diagnosis not present

## 2022-03-02 DIAGNOSIS — R6889 Other general symptoms and signs: Secondary | ICD-10-CM | POA: Diagnosis not present

## 2022-03-02 DIAGNOSIS — I10 Essential (primary) hypertension: Secondary | ICD-10-CM | POA: Diagnosis not present

## 2022-03-02 DIAGNOSIS — D631 Anemia in chronic kidney disease: Secondary | ICD-10-CM | POA: Diagnosis not present

## 2022-03-02 LAB — I-STAT VENOUS BLOOD GAS, ED
Acid-Base Excess: 9 mmol/L — ABNORMAL HIGH (ref 0.0–2.0)
Acid-Base Excess: 9 mmol/L — ABNORMAL HIGH (ref 0.0–2.0)
Bicarbonate: 32 mmol/L — ABNORMAL HIGH (ref 20.0–28.0)
Bicarbonate: 33.1 mmol/L — ABNORMAL HIGH (ref 20.0–28.0)
Calcium, Ion: 1.04 mmol/L — ABNORMAL LOW (ref 1.15–1.40)
Calcium, Ion: 1.08 mmol/L — ABNORMAL LOW (ref 1.15–1.40)
HCT: 30 % — ABNORMAL LOW (ref 36.0–46.0)
HCT: 33 % — ABNORMAL LOW (ref 36.0–46.0)
Hemoglobin: 10.2 g/dL — ABNORMAL LOW (ref 12.0–15.0)
Hemoglobin: 11.2 g/dL — ABNORMAL LOW (ref 12.0–15.0)
O2 Saturation: 96 %
O2 Saturation: 99 %
Potassium: >8.5 mmol/L (ref 3.5–5.1)
Potassium: >8.5 mmol/L (ref 3.5–5.1)
Sodium: 133 mmol/L — ABNORMAL LOW (ref 135–145)
Sodium: 133 mmol/L — ABNORMAL LOW (ref 135–145)
TCO2: 33 mmol/L — ABNORMAL HIGH (ref 22–32)
TCO2: 34 mmol/L — ABNORMAL HIGH (ref 22–32)
pCO2, Ven: 37 mmHg — ABNORMAL LOW (ref 44–60)
pCO2, Ven: 40.9 mmHg — ABNORMAL LOW (ref 44–60)
pH, Ven: 7.516 — ABNORMAL HIGH (ref 7.25–7.43)
pH, Ven: 7.546 — ABNORMAL HIGH (ref 7.25–7.43)
pO2, Ven: 143 mmHg — ABNORMAL HIGH (ref 32–45)
pO2, Ven: 69 mmHg — ABNORMAL HIGH (ref 32–45)

## 2022-03-02 LAB — CBG MONITORING, ED: Glucose-Capillary: 162 mg/dL — ABNORMAL HIGH (ref 70–99)

## 2022-03-02 LAB — BASIC METABOLIC PANEL
Anion gap: 14 (ref 5–15)
Anion gap: 9 (ref 5–15)
BUN: 82 mg/dL — ABNORMAL HIGH (ref 8–23)
BUN: 24 mg/dL — ABNORMAL HIGH (ref 8–23)
CO2: 26 mmol/L (ref 22–32)
CO2: 30 mmol/L (ref 22–32)
Calcium: 9.6 mg/dL (ref 8.9–10.3)
Calcium: 8.8 mg/dL — ABNORMAL LOW (ref 8.9–10.3)
Chloride: 95 mmol/L — ABNORMAL LOW (ref 98–111)
Chloride: 99 mmol/L (ref 98–111)
Creatinine, Ser: 10.07 mg/dL — ABNORMAL HIGH (ref 0.44–1.00)
Creatinine, Ser: 4.14 mg/dL — ABNORMAL HIGH (ref 0.44–1.00)
GFR, Estimated: 4 mL/min — ABNORMAL LOW (ref >60)
GFR, Estimated: 11 mL/min — ABNORMAL LOW (ref >60)
Glucose, Bld: 166 mg/dL — ABNORMAL HIGH (ref 70–99)
Glucose, Bld: 76 mg/dL (ref 70–99)
Potassium: >7.5 mmol/L (ref 3.5–5.1)
Potassium: 4.3 mmol/L (ref 3.5–5.1)
Sodium: 135 mmol/L (ref 135–145)
Sodium: 138 mmol/L (ref 135–145)

## 2022-03-02 LAB — HEPATITIS C ANTIBODY: HCV Ab: NONREACTIVE

## 2022-03-02 LAB — TROPONIN I (HIGH SENSITIVITY)
Troponin I (High Sensitivity): 7 ng/L (ref <18)
Troponin I (High Sensitivity): 8 ng/L (ref <18)
Troponin I (High Sensitivity): 11 ng/L (ref <18)
Troponin I (High Sensitivity): 12 ng/L (ref <18)

## 2022-03-02 LAB — RESP PANEL BY RT-PCR (FLU A&B, COVID) ARPGX2
Influenza A by PCR: NEGATIVE
Influenza B by PCR: NEGATIVE
SARS Coronavirus 2 by RT PCR: NEGATIVE

## 2022-03-02 LAB — MAGNESIUM
Magnesium: 2.7 mg/dL — ABNORMAL HIGH (ref 1.7–2.4)
Magnesium: 1.8 mg/dL (ref 1.7–2.4)

## 2022-03-02 LAB — HEPATITIS B SURFACE ANTIGEN: Hepatitis B Surface Ag: NONREACTIVE

## 2022-03-02 LAB — POTASSIUM: Potassium: >7.5 mmol/L (ref 3.5–5.1)

## 2022-03-02 LAB — HEPATITIS B SURFACE ANTIBODY,QUALITATIVE: Hep B S Ab: REACTIVE — AB

## 2022-03-02 LAB — HEPATITIS B CORE ANTIBODY, TOTAL: Hep B Core Total Ab: NONREACTIVE

## 2022-03-02 MED ORDER — ALBUTEROL SULFATE (2.5 MG/3ML) 0.083% IN NEBU
10.0000 mg | INHALATION_SOLUTION | Freq: Once | RESPIRATORY_TRACT | Status: AC
  Administered 2022-03-02: 10 mg via RESPIRATORY_TRACT
  Filled 2022-03-02: qty 12

## 2022-03-02 MED ORDER — CALCIUM GLUCONATE 10 % IV SOLN
1.0000 g | Freq: Once | INTRAVENOUS | Status: AC
  Administered 2022-03-02: 1 g via INTRAVENOUS
  Filled 2022-03-02: qty 10

## 2022-03-02 MED ORDER — SODIUM BICARBONATE 8.4 % IV SOLN
50.0000 meq | Freq: Once | INTRAVENOUS | Status: AC
  Administered 2022-03-02: 50 meq via INTRAVENOUS
  Filled 2022-03-02: qty 50

## 2022-03-02 MED ORDER — ONDANSETRON 4 MG PO TBDP: 4.0000 mg | ORAL_TABLET | Freq: Three times a day (TID) | ORAL | 0 refills | Status: DC | PRN

## 2022-03-02 MED ORDER — INSULIN ASPART 100 UNIT/ML IV SOLN
5.0000 [IU] | Freq: Once | INTRAVENOUS | Status: AC
  Administered 2022-03-02: 5 [IU] via INTRAVENOUS

## 2022-03-02 MED ORDER — CALCIUM GLUCONATE-NACL 1-0.675 GM/50ML-% IV SOLN
1.0000 g | Freq: Once | INTRAVENOUS | Status: AC
  Administered 2022-03-02: 1000 mg via INTRAVENOUS
  Filled 2022-03-02: qty 50

## 2022-03-02 MED ORDER — ONDANSETRON HCL 4 MG/2ML IJ SOLN: 4.0000 mg | Freq: Once | INTRAMUSCULAR | Status: DC

## 2022-03-02 MED ORDER — CHLORHEXIDINE GLUCONATE CLOTH 2 % EX PADS: 6.0000 | MEDICATED_PAD | Freq: Every day | CUTANEOUS | Status: DC

## 2022-03-02 MED ORDER — DEXTROSE 50 % IV SOLN
1.0000 | Freq: Once | INTRAVENOUS | Status: AC
  Administered 2022-03-02: 50 mL via INTRAVENOUS
  Filled 2022-03-02: qty 50

## 2022-03-02 NOTE — ED Notes (Signed)
Pt transported to Dialysis ?

## 2022-03-02 NOTE — ED Provider Notes (Addendum)
  Physical Exam  BP (!) 130/53 (BP Location: Right Arm)   Pulse (!) 34   Temp (!) 97.5 F (36.4 C)   Resp 16   SpO2 95%     Procedures  .Critical Care  Performed by: Regan Lemming, MD Authorized by: Regan Lemming, MD   Critical care provider statement:    Critical care time (minutes):  80   Critical care was necessary to treat or prevent imminent or life-threatening deterioration of the following conditions:  Endocrine crisis   Critical care was time spent personally by me on the following activities:  Development of treatment plan with patient or surrogate, discussions with consultants, evaluation of patient's response to treatment, examination of patient, ordering and review of laboratory studies, ordering and review of radiographic studies, ordering and performing treatments and interventions, pulse oximetry, re-evaluation of patient's condition and review of old charts   Care discussed with: admitting provider   Ultrasound ED Peripheral IV (Provider)  Date/Time: 03/02/2022 8:30 AM  Performed by: Regan Lemming, MD Authorized by: Regan Lemming, MD   Procedure details:    Indications: multiple failed IV attempts     Skin Prep: chlorhexidine gluconate     Location:  Right AC   Angiocath:  20 G   Bedside Ultrasound Guided: Yes     Images: not archived     Patient tolerated procedure without complications: Yes     Dressing applied: Yes     ED Course / MDM   Clinical Course as of 03/02/22 0837  Mon Mar 02, 2022  0833 BUN(!): 82 [JL]  0836 Potassium(!!): >8.5 [JL]  0836 Potassium(!!): >7.5 [JL]  0836 BUN(!): 82 [JL]    Clinical Course User Index [JL] Regan Lemming, MD   Medical Decision Making Amount and/or Complexity of Data Reviewed Labs: ordered. Decision-making details documented in ED Course. Radiology: ordered.  Risk OTC drugs. Prescription drug management.   Six 66-year-old female with medical history significant for ESRD on hemodialysis who presents  to the emergency department with bradycardia, generalized fatigue, mild chest discomfort, nausea.  Patient was due for dialysis this morning.  Concern for hyperkalemia on arrival, the patient was bradycardic with heart rates in the 30s, junctional rhythm present, normotensive, mildly altered.  The patient had a brief episode of a junctional tachycardia.  She is status post IV calcium gluconate and IV bicarbonate.  Ultrasound-guided IV access was obtained as additional labs were hemolyzed and the patient's initial IV placed by EMS was not fully drawing back and not flushing.  Patient was administered additional IV calcium gluconate, insulin/dextrose, started on albuterol nebulizer due to concern for hyperkalemia.  Following these interventions, the patient's heart rate improved from bradycardia to normal sinus rhythm. Her QRS appeared to narrow. Her potassium i-STAT came back greater than 8.5, a nonhemolyzed BMP following this resulted elevated to greater than 7.5.  Nephrology was consulted for emergent dialysis and temporizing measures were continued.   I spoke with Dr. Jonnie Finner of on-call nephrology who will take the patient to emergent dialysis today.  The patient will need repeat assessment following dialysis to determine final disposition.     Regan Lemming, MD 03/02/22 1043    Regan Lemming, MD 03/02/22 1544

## 2022-03-02 NOTE — ED Notes (Signed)
Arrives to this RN's hallway. Ne report received.

## 2022-03-02 NOTE — Progress Notes (Signed)
Received patient in bed to unit.  Alert and oriented.  Informed consent signed and in chart.   Treatment initiated: 1046 Treatment completed: 4360  Patient tolerated well.  Transported back to ED. Alert, without acute distress.  Hand-off given to patient's nurse.   Access used: L AVF  Access issues: none  Total UF removed: 2.5L Medication(s) given: none Post HD VS: 97.8, 167/63, 73, 12, 97% on RA Post HD weight: unable to obtain   [facility]

## 2022-03-02 NOTE — Consult Note (Signed)
Renal Service Consult Note [facility]  Rachael Burke 03/02/2022 Sol Blazing, MD Requesting Physician: Dr. Armandina Gemma  Reason for Consult: ESRD pt w/ Raliegh Ip HPI: The patient is a 66 y.o. year-old w/ hx of DM2, esrd on HD mwf, HL, HTN who presented to ED this am for gen'd weakness, nausea and sweats. In ED pulse in the 30s w/ BP 124/72. Pt had wide QRS. iStat K+ was > 8.5 and telemetry tracing improved and pt felt better after temporizing medications. We are asked to see for dialysis.   Pt seen in ED, dtr at bedside and is in charge of pts diet at home. She follows the restrictions and denies any high K+ foods of  late.  Has not missed HD. No access isseus.   ROS - denies CP, no joint pain, no HA, no blurry vision, no rash, no diarrhea, no nausea/ vomiting, no dysuria, no difficulty voiding   Past Medical History  Past Medical History:  Diagnosis Date   Anemia    Anxiety    Arthritis    Diabetes mellitus without complication (HCC)    type 2   Heart murmur    echo 07/02/20: Mild MR/TR, mild-moderate AV sclerosis, bicuspid or functional bicuspid AV, no evidence of AS. Murmr felt due to AV.   History of blood transfusion    Hyperlipidemia    Hypertension    Pneumonia    PONV (postoperative nausea and vomiting)    Renal disorder    M-W-F   Past Surgical History  Past Surgical History:  Procedure Laterality Date   AV FISTULA PLACEMENT Left 05/20/2020   Procedure: INSERTION OF LEFT ARM ARTERIOVENOUS (AV) FISTULA;  Surgeon: Marty Heck, MD;  Location: [place];  Service: Vascular;  Laterality: Left;   [place] Left 08/05/2020   Procedure: SECOND STAGE BASILIC VEIN TRANSPOSITION;  Surgeon: Marty Heck, MD;  Location: [place];  Service: Vascular;  Laterality: Left;   BIOPSY  09/22/2021   Procedure: BIOPSY;  Surgeon: Ladene Artist, MD;  Location: [facility] ENDOSCOPY;  Service: Gastroenterology;;   CESAREAN SECTION     CHOLECYSTECTOMY      ESOPHAGOGASTRODUODENOSCOPY (EGD) WITH PROPOFOL N/A 09/22/2021   Procedure: ESOPHAGOGASTRODUODENOSCOPY (EGD) WITH PROPOFOL;  Surgeon: Ladene Artist, MD;  Location: [facility] ENDOSCOPY;  Service: Gastroenterology;  Laterality: N/A;   INSERTION OF DIALYSIS CATHETER N/A 05/20/2020   Procedure: INSERTION OF DIALYSIS CATHETER;  Surgeon: Marty Heck, MD;  Location: [place];  Service: Vascular;  Laterality: N/A;   VIDEO BRONCHOSCOPY Bilateral 09/26/2021   Procedure: VIDEO BRONCHOSCOPY WITHOUT FLUORO;  Surgeon: Candee Furbish, MD;  Location: [facility] ENDOSCOPY;  Service: Pulmonary;  Laterality: Bilateral;   Family History  Family History  Problem Relation Age of Onset   Diabetes Mother    Hypertension Father    Diabetes Sister    Diabetes Brother    Diabetes Brother    Social History  reports that she has never smoked. She has never used smokeless tobacco. She reports that she does not drink alcohol and does not use drugs. Allergies No Known Allergies Home medications Prior to Admission medications   Medication Sig Start Date End Date Taking? Authorizing Provider  acetaminophen (TYLENOL) 325 MG tablet Take 2 tablets (650 mg total) by mouth every 6 (six) hours as needed for mild pain or moderate pain. 09/05/21  Yes Carlisle Cater, PA-C  amLODipine (NORVASC) 10 MG tablet Take 1 tablet by mouth daily.   Yes [provider]  calcium acetate (PHOSLO) 667 MG capsule Take 667 mg by mouth 3 (three) times daily. 04/02/21  Yes [provider]  carvedilol (COREG) 25 MG tablet Take 1 tablet (25 mg total) by mouth 2 (two) times daily with a meal. 01/04/22  Yes Cherene Altes, MD  gabapentin (NEURONTIN) 100 MG capsule Take 100 mg by mouth at bedtime. 09/03/21  Yes [provider]  isosorbide-hydrALAZINE (BIDIL) 20-37.5 MG tablet Take 1 tablet by mouth 3 (three) times daily. 01/04/22  Yes Cherene Altes, MD  linagliptin (TRADJENTA) 5 MG TABS tablet Take 5 mg by mouth daily.   Yes  [provider]  losartan (COZAAR) 100 MG tablet Take 1 tablet (100 mg total) by mouth at bedtime. 01/22/22 04/22/22 Yes Custovic, Collene Mares, DO  nitroGLYCERIN (NITROSTAT) 0.4 MG SL tablet Place 1 tablet (0.4 mg total) under the tongue every 5 (five) minutes as needed for chest pain. 01/09/22 04/09/22 Yes Custovic, Collene Mares, DO  olopatadine (PATANOL) 0.1 % ophthalmic solution Place 1 drop into both eyes 2 (two) times daily as needed for allergies.   Yes [provider]  omeprazole (PRILOSEC OTC) 20 MG tablet Take 20 mg by mouth daily as needed (indigestion).   Yes [provider]  ondansetron (ZOFRAN-ODT) 4 MG disintegrating tablet Take 1 tablet (4 mg total) by mouth every 8 (eight) hours as needed for nausea or vomiting. 09/05/21  Yes Carlisle Cater, PA-C     Vitals:   03/02/22 0605 03/02/22 0700 03/02/22 0845  BP: (!) 130/53 (!) 154/65 (!) 184/53  Pulse: (!) 34 (!) 48 (!) 58  Resp: 16 17 20   Temp: (!) 97.5 F (36.4 C)    SpO2: 95% 94% 100%   Exam Gen alert, no distress No rash, cyanosis or gangrene Sclera anicteric, throat clear  No jvd or bruits Chest clear bilat to bases, no rales/ wheezing RRR no RG Abd soft ntnd no mass or ascites +bs GU defer MS no joint effusions or deformity Ext no LE or UE edema, no wounds or ulcers Neuro is alert, Ox 3 , nf    LUA AVF+bruit   Home meds include - amlodipine 10, carvedilol 25 bid, phoslo 1 ac, gabapentin 100 hs, Bidil tid, linagliptin, losartan 100 hs, sl ntg, omeprazole, prns/ vits/ supps   OP HD: MWF AF  3.5h   400/500  2/2 bath  P2  LUA AVF  Hep none  Assessment/ Plan: Severe hyperkalemia - w/ bradycardia, prolonged QRS. K+ > 7.5 in ED. Tracing has  improved now w/ temporizing measures. Dtr at bedside denies any high K+ foods, has not missed any HD either. Plan HD upstairs as soon as possible. Will return to ED for reassessment post HD.  ESRD - on HD MWF. Has not missed HD. HD today on schedule. If improved post HD  could be dc'd home, defer to ED team.  Anemia esrd - Hb 11, no esa needed MBD ckd - Ca in range, cont binders.  DM2        Rob Lorice Lafave  MD 03/02/2022, 8:55 AM Recent Labs  Lab 03/02/22 0745 03/02/22 0803 03/02/22 0821  HGB  --  11.2* 10.2*  CALCIUM 9.6  --   --   CREATININE 10.07*  --   --   K >7.5* >8.5* >8.5*   Inpatient medications:   calcium gluconate 1,000 mg (03/02/22 0848)

## 2022-03-02 NOTE — Procedures (Signed)
   I was present at this dialysis session, have reviewed the session itself and made  appropriate changes Kelly Splinter MD [place] pager [phone]   03/02/2022, 1:01 PM

## 2022-03-02 NOTE — ED Provider Notes (Signed)
  Physical Exam  BP (!) 170/89   Pulse 73   Temp 98 F (36.7 C)   Resp 16   SpO2 100%   Physical Exam  Procedures  Procedures  ED Course / MDM   Clinical Course as of 03/03/22 0942  [facility] Mar 02, 2022  0833 BUN(!): 82 [JL]  0836 Potassium(!!): >8.5 [JL]  0836 Potassium(!!): >7.5 [JL]  0836 BUN(!): 82 [JL]  1544 Assumed care from Dr Armandina Gemma. 66 yo F on ESRD who presented with AMS and bradycardia in the setting of hyperkalemia. K was >9 at that time. They report full compliance with her MWF iHD. Full code verified with patient's daughter at time of initial evaluation.   Patient reevaluated with daughter at the bedside.  They report that she has been compliant with her dialysis.  They report that she has not had any abbreviated sessions recently.  She reports that she has had nausea, vomiting, and diarrhea over the past day that has been severe.  Says that she is looking better but still seems more drowsy than usual at this time.  She is also complaining of left-sided sharp chest pain.  This is not positional and denies any shortness of breath or cough associated with it.  No radiation of the pain is unsure if it is exertional.  Patient was initially in hallway and was discussed with charge to move her to a room that has telemetry capability.  She was placed on transport monitor and was found to have a normal QRS.  Repeat EKG showed improved peak T waves, QTc, bradycardia, and QRS duration.  We will repeat labs at this time since patient is still not feeling back to baseline and obtain CT head and troponin to evaluate for possible uremic ICH or atraumatic ICH and MI.  [RP]  1725 Signed out to Dr Billy Fischer awaiting repeat blood work and CT scans.  Anticipate observation per the patient. [RP]    Clinical Course User Index [JL] Regan Lemming, MD [RP] Fransico Meadow, MD    Received care of patient from previous provider. Please see his note for prior history, physical and care. Briefly  this is a 66yo female with ESRD on dialysis who presented with n/v/d/confusion and was found to have hyperkalemia this AM. She was taken to dialysis emergently and returned, family thought initally she was not back to baseline and CT and repeat labs ordered.    CT head personally evaluated and interpreted by me shows no acute abnormalities.    Labs reviewed by me show no remaining hyperkalemia, uremia, troponin negative.    Discussed with patient and family again. They report at this time she is improved, just with fatigue.  She was given zofran and able to tolerate po.  At this time do not see emergent indication for admission.  Recommend continued supportive care for n/v/d, given rx for zofran. Patient discharged in stable condition with understanding of reasons to return.    Gareth Morgan, MD 03/03/22 [phone]

## 2022-03-02 NOTE — ED Triage Notes (Signed)
Pt arrived via [facility] for generalized weakness and nausea. Family of pt had stated that she was progressively getting worse through the night. Pt has had episodes of N/V and has been bradycardic. Pulse was in the low 30s. Pt has a 20g in the RFA and the pt received 50 of bicarb and 1g of calcium. Pt still had a irregular rhythm around 50-70 however the pt has been in and out of bigeminy.   Initial BP was 124/72 and after medication it was 174/98. Pt became more alert after medication. Pt is also due for dialysis this morning.    Pt's initial QRS of .16

## 2022-03-02 NOTE — ED Notes (Signed)
Report given to dialysis.   

## 2022-03-02 NOTE — ED Provider Notes (Signed)
  Physical Exam  BP (!) 167/63 (BP Location: Right Leg)   Pulse 73   Temp 97.8 F (36.6 C) (Oral)   Resp 12   SpO2 97%   Physical Exam Constitutional:      Appearance: Normal appearance.  HENT:     Head: Normocephalic and atraumatic.  Eyes:     Conjunctiva/sclera: Conjunctivae normal.     Pupils: Pupils are equal, round, and reactive to light.  Cardiovascular:     Rate and Rhythm: Normal rate and regular rhythm.  Pulmonary:     Effort: No respiratory distress.     Breath sounds: Normal breath sounds.  Musculoskeletal:     Comments: Fistula in left upper extremity with palpable thrill and bruit auscultated  Neurological:     Mental Status: She is alert.     Procedures  Procedures  ED Course / MDM   Clinical Course as of 03/02/22 1852  Mon Mar 02, 2022  0833 BUN(!): 82 [JL]  0836 Potassium(!!): >8.5 [JL]  0836 Potassium(!!): >7.5 [JL]  0836 BUN(!): 82 [JL]  1544 Assumed care from Dr Armandina Gemma. 66 yo F on ESRD who presented with AMS and bradycardia in the setting of hyperkalemia. K was >9 at that time. They report full compliance with her MWF iHD. Full code verified with patient's daughter at time of initial evaluation.   Patient reevaluated with daughter at the bedside.  They report that she has been compliant with her dialysis.  They report that she has not had any abbreviated sessions recently.  She reports that she has had nausea, vomiting, and diarrhea over the past day that has been severe.  Says that she is looking better but still seems more drowsy than usual at this time.  She is also complaining of left-sided sharp chest pain.  This is not positional and denies any shortness of breath or cough associated with it.  No radiation of the pain is unsure if it is exertional.  Patient was initially in hallway and was discussed with charge to move her to a room that has telemetry capability.  She was placed on transport monitor and was found to have a normal QRS.  Repeat EKG  showed improved peak T waves, QTc, bradycardia, and QRS duration.  We will repeat labs at this time since patient is still not feeling back to baseline and obtain CT head and troponin to evaluate for possible uremic ICH or atraumatic ICH and MI.  [RP]  1725 Signed out to Dr Billy Fischer awaiting repeat blood work and CT scans.  Anticipate observation per the patient. [RP]    Clinical Course User Index [JL] Regan Lemming, MD [RP] Fransico Meadow, MD   Medical Decision Making Amount and/or Complexity of Data Reviewed Labs: ordered. Decision-making details documented in ED Course. Radiology: ordered.  Risk OTC drugs. Prescription drug management.     Fransico Meadow, MD 03/02/22 [phone]

## 2022-03-02 NOTE — ED Provider Notes (Signed)
[facility] EMERGENCY DEPARTMENT Provider Note   CSN: [id number] Arrival date & time: 03/02/22  0548     History  Chief complaint: Vomiting, bradycardia  Rachael Burke is a 66 y.o. female.  The history is provided by the EMS personnel and the patient.  She has history of hypertension, diabetes, hyperlipidemia, end-stage renal disease on hemodialysis is brought in by ambulance because of nausea and vomiting at home.  Her dialysis is Monday-Wednesday-Friday and she did receive dialysis 3 days ago but may have missed 1 session last week.  Nausea and vomiting started yesterday and got worse through the evening.  She is also complaining of being generally weak.  EMS noted bradycardia with wide QRS complex and tall T waves and gave sodium bicarbonate and calcium with narrowing of the QRS complex and some improvement in the heart rate.  She then developed episodes of bigeminy.  Heart rate has been as low as 30 but blood pressure has been adequate.  Patient denies fever or chills but was noted to be diaphoretic.   Home Medications Prior to Admission medications   Medication Sig Start Date End Date Taking? Authorizing Provider  acetaminophen (TYLENOL) 325 MG tablet Take 2 tablets (650 mg total) by mouth every 6 (six) hours as needed for mild pain or moderate pain. 09/05/21   Carlisle Cater, PA-C  amLODipine (NORVASC) 10 MG tablet Take 1 tablet by mouth daily.    [provider]  calcium acetate (PHOSLO) 667 MG capsule Take 667 mg by mouth 3 (three) times daily. 04/02/21   [provider]  carvedilol (COREG) 25 MG tablet Take 1 tablet (25 mg total) by mouth 2 (two) times daily with a meal. 01/04/22   Cherene Altes, MD  gabapentin (NEURONTIN) 100 MG capsule Take 100 mg by mouth at bedtime. 09/03/21   [provider]  isosorbide-hydrALAZINE (BIDIL) 20-37.5 MG tablet Take 1 tablet by mouth 3 (three) times daily. 01/04/22   Cherene Altes, MD   linagliptin (TRADJENTA) 5 MG TABS tablet Take 5 mg by mouth daily.    [provider]  losartan (COZAAR) 100 MG tablet Take 1 tablet (100 mg total) by mouth at bedtime. 01/22/22 04/22/22  Custovic, Collene Mares, DO  nitroGLYCERIN (NITROSTAT) 0.4 MG SL tablet Place 1 tablet (0.4 mg total) under the tongue every 5 (five) minutes as needed for chest pain. 01/09/22 04/09/22  Custovic, Collene Mares, DO  olopatadine (PATANOL) 0.1 % ophthalmic solution Place 1 drop into both eyes 2 (two) times daily as needed for allergies.    [provider]  omeprazole (PRILOSEC OTC) 20 MG tablet Take 20 mg by mouth daily as needed (indigestion).    [provider]  ondansetron (ZOFRAN-ODT) 4 MG disintegrating tablet Take 1 tablet (4 mg total) by mouth every 8 (eight) hours as needed for nausea or vomiting. 09/05/21   Carlisle Cater, PA-C      Allergies    Patient has no known allergies.    Review of Systems   Review of Systems  All other systems reviewed and are negative.   Physical Exam Updated Vital Signs BP (!) 130/53 (BP Location: Right Arm)   Pulse (!) 34   Temp (!) 97.5 F (36.4 C)   Resp 16   SpO2 95%  Physical Exam Vitals and nursing note reviewed.   66 year old female, resting comfortably and in no acute distress. Vital signs are significant for slow heart rate. Oxygen saturation is 95%, which is normal.  Head is normocephalic and atraumatic. PERRLA, EOMI. Oropharynx is clear. Neck is nontender and supple without adenopathy. JVD is present. Back is nontender and there is no CVA tenderness. Lungs are clear without rales, wheezes, or rhonchi. Chest is nontender. Heart is bradycardic and irregular without murmur. Abdomen is soft, flat, nontender. Extremities have no cyanosis or edema, full range of motion is present.  AV fistula is present in the left upper arm with thrill present. Skin is warm and dry without rash. Neurologic: Awake and alert, cranial nerves grossly intact, moves  all extremities equally.  ED Results / Procedures / Treatments   Labs (all labs ordered are listed, but only abnormal results are displayed) Labs Reviewed  BASIC METABOLIC PANEL  MAGNESIUM  TROPONIN I (HIGH SENSITIVITY)    EKG EKG Interpretation  Date/Time:  Monday March 02 2022 06:01:49 EDT Ventricular Rate:  58 PR Interval:    QRS Duration: 123 QT Interval:  512 QTC Calculation: 391 R Axis:   -34 Text Interpretation: Atrial fibrillation Ventricular premature complex Left bundle branch block When compared with ECG of 01/04/2022, Atrial fibrillation has replaced Sinus rhythm Left bundle branch block is now present Confirmed by Delora Fuel ([id number]) on 03/02/2022 6:04:44 AM  Radiology DG Chest Port 1 View  Result Date: 03/02/2022 CLINICAL DATA:  Generalized weakness with nausea EXAM: PORTABLE CHEST 1 VIEW COMPARISON:  Chest CT 02/09/2022 FINDINGS: Cardiomegaly accentuated by low volume portable chest. Vascular pedicle widening. There is no edema, consolidation, effusion, or pneumothorax. Artifact from overlapping hardware. IMPRESSION: Low volume chest with cardiomegaly and vascular congestion. Electronically Signed   By: Jorje Guild M.D.   On: 03/02/2022 06:24    Procedures Procedures  Cardiac monitor shows atrial fibrillation with slow ventricular response and episodes of ventricular bigeminy, per my interpretation.  Medications Ordered in ED Medications  calcium gluconate inj 10% (1 g) URGENT USE ONLY! (1 g Intravenous Given 03/02/22 0610)  sodium bicarbonate injection 50 mEq (50 mEq Intravenous Given 03/02/22 0609)    ED Course/ Medical Decision Making/ A&P                           Medical Decision Making Amount and/or Complexity of Data Reviewed Labs: ordered. Radiology: ordered.  Risk OTC drugs. Prescription drug management.   Nausea, vomiting which may be related to uremia, may be viral gastritis/gastroenteritis.  Bradycardia with wide QRS complexes concerning  for hyperkalemia.  I have reviewed and interpreted her electrocardiogram, and my interpretation is atrial fibrillation with PVCs, wide QRS consistent with left bundle branch block.  Compared with prior ECG, left bundle branch block and atrial fibrillation are new, concerning for hyperkalemia.  I have ordered additional sodium bicarbonate and calcium.  Following this, she did briefly go into a regular rhythm at 55 bpm before slowing down again.  I have ordered basic metabolic panel, magnesium level, CBC.  She will likely need emergent dialysis.  Portable chest x-ray shows cardiomegaly and pulmonary vascular congestion.  I have independently viewed the image, and agree with the radiologist's interpretation.  Patient's heart rate did start to drop back.  Labs are still pending.  I am still concerned about hyperkalemia.  Repeat ECG is essentially the same.  Case is signed out to Dr. Armandina Gemma.  CRITICAL CARE Performed by: Delora Fuel Total critical care time: 90 minutes Critical care time was exclusive of separately billable procedures and treating other patients. Critical care was necessary to treat or prevent imminent or life-threatening  deterioration. Critical care was time spent personally by me on the following activities: development of treatment plan with patient and/or surrogate as well as nursing, discussions with consultants, evaluation of patient's response to treatment, examination of patient, obtaining history from patient or surrogate, ordering and performing treatments and interventions, ordering and review of laboratory studies, ordering and review of radiographic studies, pulse oximetry and re-evaluation of patient's condition.  Final Clinical Impression(s) / ED Diagnoses Final diagnoses:  Hyperkalemia  End-stage renal disease on hemodialysis [facility])    Rx / DC Orders ED Discharge Orders     None         Delora Fuel, MD 38/38/18 [phone]

## 2022-03-03 LAB — HEPATITIS B SURFACE ANTIBODY, QUANTITATIVE: Hep B S AB Quant (Post): >1000 m[IU]/mL (ref >9.9)

## 2022-03-04 DIAGNOSIS — E1122 Type 2 diabetes mellitus with diabetic chronic kidney disease: Secondary | ICD-10-CM | POA: Diagnosis not present

## 2022-03-04 DIAGNOSIS — D631 Anemia in chronic kidney disease: Secondary | ICD-10-CM | POA: Diagnosis not present

## 2022-03-04 DIAGNOSIS — N2581 Secondary hyperparathyroidism of renal origin: Secondary | ICD-10-CM | POA: Diagnosis not present

## 2022-03-04 DIAGNOSIS — N186 End stage renal disease: Secondary | ICD-10-CM | POA: Diagnosis not present

## 2022-03-04 DIAGNOSIS — Z992 Dependence on renal dialysis: Secondary | ICD-10-CM | POA: Diagnosis not present

## 2022-03-04 DIAGNOSIS — Z23 Encounter for immunization: Secondary | ICD-10-CM | POA: Diagnosis not present

## 2022-03-04 DIAGNOSIS — D509 Iron deficiency anemia, unspecified: Secondary | ICD-10-CM | POA: Diagnosis not present

## 2022-03-05 DIAGNOSIS — I10 Essential (primary) hypertension: Secondary | ICD-10-CM | POA: Diagnosis not present

## 2022-03-05 DIAGNOSIS — N186 End stage renal disease: Secondary | ICD-10-CM | POA: Diagnosis not present

## 2022-03-05 DIAGNOSIS — Z01818 Encounter for other preprocedural examination: Secondary | ICD-10-CM | POA: Diagnosis not present

## 2022-03-05 DIAGNOSIS — E119 Type 2 diabetes mellitus without complications: Secondary | ICD-10-CM | POA: Diagnosis not present

## 2022-03-06 DIAGNOSIS — Z01812 Encounter for preprocedural laboratory examination: Secondary | ICD-10-CM | POA: Diagnosis not present

## 2022-03-06 DIAGNOSIS — D509 Iron deficiency anemia, unspecified: Secondary | ICD-10-CM | POA: Diagnosis not present

## 2022-03-06 DIAGNOSIS — Z992 Dependence on renal dialysis: Secondary | ICD-10-CM | POA: Diagnosis not present

## 2022-03-06 DIAGNOSIS — D631 Anemia in chronic kidney disease: Secondary | ICD-10-CM | POA: Diagnosis not present

## 2022-03-06 DIAGNOSIS — I120 Hypertensive chronic kidney disease with stage 5 chronic kidney disease or end stage renal disease: Secondary | ICD-10-CM | POA: Diagnosis not present

## 2022-03-06 DIAGNOSIS — E1122 Type 2 diabetes mellitus with diabetic chronic kidney disease: Secondary | ICD-10-CM | POA: Diagnosis not present

## 2022-03-06 DIAGNOSIS — N2581 Secondary hyperparathyroidism of renal origin: Secondary | ICD-10-CM | POA: Diagnosis not present

## 2022-03-06 DIAGNOSIS — Z23 Encounter for immunization: Secondary | ICD-10-CM | POA: Diagnosis not present

## 2022-03-06 DIAGNOSIS — N186 End stage renal disease: Secondary | ICD-10-CM | POA: Diagnosis not present

## 2022-03-09 DIAGNOSIS — N2581 Secondary hyperparathyroidism of renal origin: Secondary | ICD-10-CM | POA: Diagnosis not present

## 2022-03-09 DIAGNOSIS — N186 End stage renal disease: Secondary | ICD-10-CM | POA: Diagnosis not present

## 2022-03-09 DIAGNOSIS — Z23 Encounter for immunization: Secondary | ICD-10-CM | POA: Diagnosis not present

## 2022-03-09 DIAGNOSIS — E1122 Type 2 diabetes mellitus with diabetic chronic kidney disease: Secondary | ICD-10-CM | POA: Diagnosis not present

## 2022-03-09 DIAGNOSIS — D509 Iron deficiency anemia, unspecified: Secondary | ICD-10-CM | POA: Diagnosis not present

## 2022-03-09 DIAGNOSIS — D631 Anemia in chronic kidney disease: Secondary | ICD-10-CM | POA: Diagnosis not present

## 2022-03-09 DIAGNOSIS — Z992 Dependence on renal dialysis: Secondary | ICD-10-CM | POA: Diagnosis not present

## 2022-03-11 DIAGNOSIS — N2581 Secondary hyperparathyroidism of renal origin: Secondary | ICD-10-CM | POA: Diagnosis not present

## 2022-03-11 DIAGNOSIS — D509 Iron deficiency anemia, unspecified: Secondary | ICD-10-CM | POA: Diagnosis not present

## 2022-03-11 DIAGNOSIS — E1122 Type 2 diabetes mellitus with diabetic chronic kidney disease: Secondary | ICD-10-CM | POA: Diagnosis not present

## 2022-03-11 DIAGNOSIS — Z23 Encounter for immunization: Secondary | ICD-10-CM | POA: Diagnosis not present

## 2022-03-11 DIAGNOSIS — N186 End stage renal disease: Secondary | ICD-10-CM | POA: Diagnosis not present

## 2022-03-11 DIAGNOSIS — Z992 Dependence on renal dialysis: Secondary | ICD-10-CM | POA: Diagnosis not present

## 2022-03-11 DIAGNOSIS — D631 Anemia in chronic kidney disease: Secondary | ICD-10-CM | POA: Diagnosis not present

## 2022-03-13 DIAGNOSIS — N2581 Secondary hyperparathyroidism of renal origin: Secondary | ICD-10-CM | POA: Diagnosis not present

## 2022-03-13 DIAGNOSIS — Z992 Dependence on renal dialysis: Secondary | ICD-10-CM | POA: Diagnosis not present

## 2022-03-13 DIAGNOSIS — D509 Iron deficiency anemia, unspecified: Secondary | ICD-10-CM | POA: Diagnosis not present

## 2022-03-13 DIAGNOSIS — Z23 Encounter for immunization: Secondary | ICD-10-CM | POA: Diagnosis not present

## 2022-03-13 DIAGNOSIS — E1122 Type 2 diabetes mellitus with diabetic chronic kidney disease: Secondary | ICD-10-CM | POA: Diagnosis not present

## 2022-03-13 DIAGNOSIS — N186 End stage renal disease: Secondary | ICD-10-CM | POA: Diagnosis not present

## 2022-03-13 DIAGNOSIS — D631 Anemia in chronic kidney disease: Secondary | ICD-10-CM | POA: Diagnosis not present

## 2022-03-16 DIAGNOSIS — Z23 Encounter for immunization: Secondary | ICD-10-CM | POA: Diagnosis not present

## 2022-03-16 DIAGNOSIS — N2581 Secondary hyperparathyroidism of renal origin: Secondary | ICD-10-CM | POA: Diagnosis not present

## 2022-03-16 DIAGNOSIS — I120 Hypertensive chronic kidney disease with stage 5 chronic kidney disease or end stage renal disease: Secondary | ICD-10-CM | POA: Diagnosis not present

## 2022-03-16 DIAGNOSIS — R350 Frequency of micturition: Secondary | ICD-10-CM | POA: Diagnosis not present

## 2022-03-16 DIAGNOSIS — K5900 Constipation, unspecified: Secondary | ICD-10-CM | POA: Diagnosis not present

## 2022-03-16 DIAGNOSIS — N186 End stage renal disease: Secondary | ICD-10-CM | POA: Diagnosis not present

## 2022-03-16 DIAGNOSIS — E1122 Type 2 diabetes mellitus with diabetic chronic kidney disease: Secondary | ICD-10-CM | POA: Diagnosis not present

## 2022-03-16 DIAGNOSIS — D631 Anemia in chronic kidney disease: Secondary | ICD-10-CM | POA: Diagnosis not present

## 2022-03-16 DIAGNOSIS — Z538 Procedure and treatment not carried out for other reasons: Secondary | ICD-10-CM | POA: Diagnosis not present

## 2022-03-16 DIAGNOSIS — D509 Iron deficiency anemia, unspecified: Secondary | ICD-10-CM | POA: Diagnosis not present

## 2022-03-16 DIAGNOSIS — Z992 Dependence on renal dialysis: Secondary | ICD-10-CM | POA: Diagnosis not present

## 2022-03-18 DIAGNOSIS — Z23 Encounter for immunization: Secondary | ICD-10-CM | POA: Diagnosis not present

## 2022-03-18 DIAGNOSIS — E1122 Type 2 diabetes mellitus with diabetic chronic kidney disease: Secondary | ICD-10-CM | POA: Diagnosis not present

## 2022-03-18 DIAGNOSIS — H43392 Other vitreous opacities, left eye: Secondary | ICD-10-CM | POA: Diagnosis not present

## 2022-03-18 DIAGNOSIS — D509 Iron deficiency anemia, unspecified: Secondary | ICD-10-CM | POA: Diagnosis not present

## 2022-03-18 DIAGNOSIS — N186 End stage renal disease: Secondary | ICD-10-CM | POA: Diagnosis not present

## 2022-03-18 DIAGNOSIS — D631 Anemia in chronic kidney disease: Secondary | ICD-10-CM | POA: Diagnosis not present

## 2022-03-18 DIAGNOSIS — Z992 Dependence on renal dialysis: Secondary | ICD-10-CM | POA: Diagnosis not present

## 2022-03-18 DIAGNOSIS — N2581 Secondary hyperparathyroidism of renal origin: Secondary | ICD-10-CM | POA: Diagnosis not present

## 2022-03-20 DIAGNOSIS — N2581 Secondary hyperparathyroidism of renal origin: Secondary | ICD-10-CM | POA: Diagnosis not present

## 2022-03-20 DIAGNOSIS — Z992 Dependence on renal dialysis: Secondary | ICD-10-CM | POA: Diagnosis not present

## 2022-03-20 DIAGNOSIS — Z23 Encounter for immunization: Secondary | ICD-10-CM | POA: Diagnosis not present

## 2022-03-20 DIAGNOSIS — D631 Anemia in chronic kidney disease: Secondary | ICD-10-CM | POA: Diagnosis not present

## 2022-03-20 DIAGNOSIS — N186 End stage renal disease: Secondary | ICD-10-CM | POA: Diagnosis not present

## 2022-03-20 DIAGNOSIS — D509 Iron deficiency anemia, unspecified: Secondary | ICD-10-CM | POA: Diagnosis not present

## 2022-03-20 DIAGNOSIS — E1122 Type 2 diabetes mellitus with diabetic chronic kidney disease: Secondary | ICD-10-CM | POA: Diagnosis not present

## 2022-03-23 DIAGNOSIS — E1122 Type 2 diabetes mellitus with diabetic chronic kidney disease: Secondary | ICD-10-CM | POA: Diagnosis not present

## 2022-03-23 DIAGNOSIS — Z992 Dependence on renal dialysis: Secondary | ICD-10-CM | POA: Diagnosis not present

## 2022-03-23 DIAGNOSIS — Z23 Encounter for immunization: Secondary | ICD-10-CM | POA: Diagnosis not present

## 2022-03-23 DIAGNOSIS — N186 End stage renal disease: Secondary | ICD-10-CM | POA: Diagnosis not present

## 2022-03-23 DIAGNOSIS — N2581 Secondary hyperparathyroidism of renal origin: Secondary | ICD-10-CM | POA: Diagnosis not present

## 2022-03-23 DIAGNOSIS — D509 Iron deficiency anemia, unspecified: Secondary | ICD-10-CM | POA: Diagnosis not present

## 2022-03-23 DIAGNOSIS — D631 Anemia in chronic kidney disease: Secondary | ICD-10-CM | POA: Diagnosis not present

## 2022-03-24 NOTE — Progress Notes (Unsigned)
Initial neurology clinic note  Rachael Burke MRN: [id number] DOB: 1956-03-18  Referring provider: Izora Gala, MD  Primary care provider: Trey Sailors, PA  Reason for consult:  headache and vertigo  Subjective:  This is Ms. Rachael Burke de Guinevere Burke, a 66 y.o. female with a medical history of DM, HLD, HTN, ESRD (on dialysis), left sided hearing loss, uterine cancer? who presents to neurology clinic with headaches and vertigo. The patient is accompanied by daughter and Optometrist. History is obtained with help of daughter and translator.  Patient has had symptoms since 11/2018. Patient got really sick (nausea, vomiting, diarrhea, dizzy, headache, and neck pain) in [place]. The doctors there were talking about doing an exploratory surgery, but patient did not do this. This lasted about 3 months. Her nausea, vomiting, and diarrhea all improved but the dizziness and headache did not improve.  Currently patient currently has severe headaches. It is in the neck and front of her head. It is a pressure sensation. It is about 5/10. Her headaches are associated with neck pain, photophobia, phonophobia, and nausea. Her headache typically lasts hours. If she takes tylenol, the headache can resolve in 1 hour. She takes 1000 mg twice per day when she has a headache. She typically has headaches after dialysis (3 times per week). On other days, she does not have significant headaches. Triggers include being around a lot of people and watching TV. Going to lay down in cold dark room can help. She has never been given a medication for headaches.  She has dizziness that she was previously told was vertigo prior but no cause was found. The symptoms come and go. She describes the vertigo as spinning and not feeling like herself. Daughter sees her walking off balance around the house. She has not fallen. She was given Nootropil in [place]. This helps.  Of note, patient was seen at the ED on 03/02/22  after dialysis for AMS. She was found to be bradycardic, with hyperkalemic, and elevated BP (170s/90s). CT head at that time was unremarkable.  Patient takes gabapentin for neuropathy.  MEDICATIONS:  Outpatient Encounter Medications as of 03/26/2022  Medication Sig   acetaminophen (TYLENOL) 325 MG tablet Take 2 tablets (650 mg total) by mouth every 6 (six) hours as needed for mild pain or moderate pain.   amLODipine (NORVASC) 10 MG tablet Take 1 tablet by mouth daily.   calcium acetate (PHOSLO) 667 MG capsule Take 667 mg by mouth 3 (three) times daily.   carvedilol (COREG) 25 MG tablet Take 1 tablet (25 mg total) by mouth 2 (two) times daily with a meal.   gabapentin (NEURONTIN) 100 MG capsule Take 300 mg by mouth at bedtime.   isosorbide-hydrALAZINE (BIDIL) 20-37.5 MG tablet Take 1 tablet by mouth 3 (three) times daily.   linagliptin (TRADJENTA) 5 MG TABS tablet Take 5 mg by mouth daily.   nitroGLYCERIN (NITROSTAT) 0.4 MG SL tablet Place 1 tablet (0.4 mg total) under the tongue every 5 (five) minutes as needed for chest pain.   omeprazole (PRILOSEC OTC) 20 MG tablet Take 20 mg by mouth daily as needed (indigestion).   ondansetron (ZOFRAN-ODT) 4 MG disintegrating tablet Take 1 tablet (4 mg total) by mouth every 8 (eight) hours as needed for nausea or vomiting.   losartan (COZAAR) 100 MG tablet Take 1 tablet (100 mg total) by mouth at bedtime. (Patient not taking: Reported on 03/26/2022)   olopatadine (PATANOL) 0.1 % ophthalmic solution Place 1 drop into both eyes  2 (two) times daily as needed for allergies. (Patient not taking: Reported on 03/26/2022)   No facility-administered encounter medications on file as of 03/26/2022.    PAST MEDICAL HISTORY: Past Medical History:  Diagnosis Date   Anemia    Anxiety    Arthritis    Diabetes mellitus without complication (HCC)    type 2   Heart murmur    echo 07/02/20: Mild MR/TR, mild-moderate AV sclerosis, bicuspid or functional bicuspid AV, no  evidence of AS. Murmr felt due to AV.   History of blood transfusion    Hyperlipidemia    Hypertension    Pneumonia    PONV (postoperative nausea and vomiting)    Renal disorder    M-W-F    PAST SURGICAL HISTORY: Past Surgical History:  Procedure Laterality Date   AV FISTULA PLACEMENT Left 05/20/2020   Procedure: INSERTION OF LEFT ARM ARTERIOVENOUS (AV) FISTULA;  Surgeon: Marty Heck, MD;  Location: [facility];  Service: Vascular;  Laterality: Left;   [place] Left 08/05/2020   Procedure: SECOND STAGE BASILIC VEIN TRANSPOSITION;  Surgeon: Marty Heck, MD;  Location: [place];  Service: Vascular;  Laterality: Left;   BIOPSY  09/22/2021   Procedure: BIOPSY;  Surgeon: Ladene Artist, MD;  Location: [facility] ENDOSCOPY;  Service: Gastroenterology;;   CESAREAN SECTION     CHOLECYSTECTOMY     ESOPHAGOGASTRODUODENOSCOPY (EGD) WITH PROPOFOL N/A 09/22/2021   Procedure: ESOPHAGOGASTRODUODENOSCOPY (EGD) WITH PROPOFOL;  Surgeon: Ladene Artist, MD;  Location: [facility] ENDOSCOPY;  Service: Gastroenterology;  Laterality: N/A;   INSERTION OF DIALYSIS CATHETER N/A 05/20/2020   Procedure: INSERTION OF DIALYSIS CATHETER;  Surgeon: Marty Heck, MD;  Location: [facility];  Service: Vascular;  Laterality: N/A;   VIDEO BRONCHOSCOPY Bilateral 09/26/2021   Procedure: VIDEO BRONCHOSCOPY WITHOUT FLUORO;  Surgeon: Candee Furbish, MD;  Location: [facility] ENDOSCOPY;  Service: Pulmonary;  Laterality: Bilateral;    ALLERGIES: No Known Allergies  FAMILY HISTORY: Family History  Problem Relation Age of Onset   Diabetes Mother    Hypertension Father    Diabetes Sister    Diabetes Brother    Diabetes Brother     SOCIAL HISTORY: Social History   Tobacco Use   Smoking status: Never   Smokeless tobacco: Never  Vaping Use   Vaping Use: Never used  Substance Use Topics   Alcohol use: Never   Drug use: Never   Social History   Social History Narrative   Right handed   Caffeine 1/2 cup  daily   One story home    Objective:  Vital Signs:  BP (!) 161/68   Pulse 72   Ht 5' (1.524 m)   Wt 149 lb (67.6 kg)   SpO2 99%   BMI 29.10 kg/m   General: No acute distress.  Patient appears well-groomed.   Head:  Normocephalic/atraumatic Eyes:  fundi examined but not visualized Neck: supple, paraspinal tenderness, reduced range of motion  Neurological Exam: Mental status: alert and oriented, speech fluent and not dysarthric, language intact.  Cranial nerves: CN I: not tested CN II: pupils equal, round and reactive to light, visual fields intact CN III, IV, VI:  full range of motion, no nystagmus, no ptosis CN V: facial sensation intact. CN VII: upper and lower face symmetric CN VIII: hearing intact CN IX, X: gag intact, uvula midline CN XI: sternocleidomastoid and trapezius muscles intact CN XII: tongue midline  Bulk & Tone: normal, no fasciculations. Motor:  muscle strength 5/5 throughout Deep  Tendon Reflexes:  2+ throughout  Sensation:  Intact. Finger to nose testing:  Without dysmetria.   Gait:  Normal station and stride.   Labs and Imaging review: Internal labs: Lab Results  Component Value Date   HGBA1C 4.5 (L) 09/21/2021   Lab Results  Component Value Date   VITAMINB12 1,252 (H) 04/07/2019   No results found for: "TSH" No results found for: "ESRSEDRATE", "POCTSEDRATE"  Imaging: CT head wo contrast (03/02/22): FINDINGS: Brain: No evidence of acute infarction, hemorrhage, hydrocephalus, extra-axial collection or mass lesion/mass effect. Mild generalized parenchymal volume loss, within normal range for patient age. Scattered hypodensities in the periventricular and subcortical white matter, consistent with chronic small-vessel ischemic changes.   Vascular: Vascular calcifications at the skull base. No hyperdense vessel or unexpected calcification.   Skull: Normal. Negative for fracture or focal lesion.   Sinuses/Orbits: No acute finding. Trace  mucosal thickening in a posterior left ethmoid air cell. The other paranasal sinuses and mastoid air cells are clear.   Other: None.   IMPRESSION: No acute intracranial abnormality.  CT maxillofacial wo contrast (10/07/21): FINDINGS: Osseous: Negative for fracture, erosion, or malalignment at the temporomandibular joints. No evidence of degeneration of the TMJ. Multiple missing teeth with dental appliance that obscures remaining teeth, many of which have undergone repair.   Orbits: No evidence of inflammation or mass   Sinuses: Essentially clear, only trace mucosal thickening seen along the right maxillary floor   Soft tissues: Negative   Limited intracranial: Negative   IMPRESSION: Normal appearance of the temporomandibular joints.  Assessment/Plan:  Rachael Burke is a 66 y.o. female who presents for evaluation of headaches and dizziness. She has a relevant medical history of DM, HLD, HTN, ESRD (on dialysis), left sided hearing loss, uterine cancer?. Her neurological examination is essentially normal. She did have significant neck pain and reduced range of motion of the neck. Available diagnostic data is significant for CT head that was unremarkable.   Overall, patient's headaches appear to be directly related to her dialysis. She recently had to be evaluated at the ED after dialysis and found to have hyperkalemia and elevated BP. When asked about who is managing dialysis and if adjustments had been made, patient and her daughter mentioned they had not talked to anyone. Per daughter, patient does not have a nephrologist, which seems odd given that she has ESRD. Daughter also requested new PCP. I encouraged patient to follow up with PCP (previous or new) and make sure she had a nephrologist, as this would likely help some of her symptoms. Her neck may also be contributing to both headaches and dizziness, which was harder for me to characterize today. I will send to PT and get  patient to discuss with PCP and nephro, then see back to assess if more needs to be done. A headache prevention medication may be difficult as patient is already on beta blockers and may have contraindications to others given her dialysis (TCA, ?long QT, arrhthymias). I will manage conservatively now until her care is more coordinated (or she understands her care better).  PLAN: -Physical therapy consult for neck pain -Would like referral to new PCP per daughter -Would like referral for nephrologist (says she does not have one?)  -Return to clinic in about 3 months  The impression above as well as the plan as outlined below were extensively discussed with the patient (in the company of daughter and Optometrist) who voiced understanding. All questions were answered to their satisfaction.  When available, results of the above investigations and possible further recommendations will be communicated to the patient via telephone/MyChart. Patient to call office if not contacted after expected testing turnaround time.   Total time spent reviewing records, interview, history/exam, documentation, and coordination of care on day of encounter:  60 min   Thank you for allowing me to participate in patient's care.  If I can answer any additional questions, I would be pleased to do so.  Kai Levins, MD   CC: Trey Sailors, [address] [place] [address]  CC: Referring provider: Izora Gala, MD [address] [place],  [place] [address]

## 2022-03-25 DIAGNOSIS — D509 Iron deficiency anemia, unspecified: Secondary | ICD-10-CM | POA: Diagnosis not present

## 2022-03-25 DIAGNOSIS — E1122 Type 2 diabetes mellitus with diabetic chronic kidney disease: Secondary | ICD-10-CM | POA: Diagnosis not present

## 2022-03-25 DIAGNOSIS — Z23 Encounter for immunization: Secondary | ICD-10-CM | POA: Diagnosis not present

## 2022-03-25 DIAGNOSIS — N186 End stage renal disease: Secondary | ICD-10-CM | POA: Diagnosis not present

## 2022-03-25 DIAGNOSIS — Z992 Dependence on renal dialysis: Secondary | ICD-10-CM | POA: Diagnosis not present

## 2022-03-25 DIAGNOSIS — D631 Anemia in chronic kidney disease: Secondary | ICD-10-CM | POA: Diagnosis not present

## 2022-03-25 DIAGNOSIS — N2581 Secondary hyperparathyroidism of renal origin: Secondary | ICD-10-CM | POA: Diagnosis not present

## 2022-03-26 ENCOUNTER — Telehealth: Payer: Self-pay

## 2022-03-26 ENCOUNTER — Encounter: Payer: Self-pay | Admitting: Neurology

## 2022-03-26 ENCOUNTER — Ambulatory Visit (INDEPENDENT_AMBULATORY_CARE_PROVIDER_SITE_OTHER): Payer: Medicare Other | Admitting: Neurology

## 2022-03-26 VITALS — BP 161/68 | HR 72 | Ht 60.0 in | Wt 149.0 lb

## 2022-03-26 DIAGNOSIS — R42 Dizziness and giddiness: Secondary | ICD-10-CM | POA: Diagnosis not present

## 2022-03-26 DIAGNOSIS — N186 End stage renal disease: Secondary | ICD-10-CM

## 2022-03-26 DIAGNOSIS — M542 Cervicalgia: Secondary | ICD-10-CM

## 2022-03-26 DIAGNOSIS — R519 Headache, unspecified: Secondary | ICD-10-CM

## 2022-03-26 NOTE — Patient Instructions (Signed)
I would like you to go to physical therapy for your neck as this could be causing your headaches and dizziness.  You need to speak to your primary care and kidney doctors about dialysis as this appears to be related to your headaches.  I would like to see you again in 3 months after we work on the things above to see how it is helping.  The physicians and staff at [facility] are committed to providing excellent care. You may receive a survey requesting feedback about your experience at our office. We strive to receive "very good" responses to the survey questions. If you feel that your experience would prevent you from giving the office a "very good " response, please contact our office to try to remedy the situation. We may be reached at [phone]. Thank you for taking the time out of your busy day to complete the survey.   Kai Levins, MD [facility]

## 2022-03-26 NOTE — Telephone Encounter (Signed)
Gave Pt a web site and number to fine a PCP

## 2022-03-27 DIAGNOSIS — N186 End stage renal disease: Secondary | ICD-10-CM | POA: Diagnosis not present

## 2022-03-27 DIAGNOSIS — Z992 Dependence on renal dialysis: Secondary | ICD-10-CM | POA: Diagnosis not present

## 2022-03-27 DIAGNOSIS — E1122 Type 2 diabetes mellitus with diabetic chronic kidney disease: Secondary | ICD-10-CM | POA: Diagnosis not present

## 2022-03-27 DIAGNOSIS — N2581 Secondary hyperparathyroidism of renal origin: Secondary | ICD-10-CM | POA: Diagnosis not present

## 2022-03-27 DIAGNOSIS — Z23 Encounter for immunization: Secondary | ICD-10-CM | POA: Diagnosis not present

## 2022-03-27 DIAGNOSIS — D509 Iron deficiency anemia, unspecified: Secondary | ICD-10-CM | POA: Diagnosis not present

## 2022-03-27 DIAGNOSIS — D631 Anemia in chronic kidney disease: Secondary | ICD-10-CM | POA: Diagnosis not present

## 2022-03-30 DIAGNOSIS — D509 Iron deficiency anemia, unspecified: Secondary | ICD-10-CM | POA: Diagnosis not present

## 2022-03-30 DIAGNOSIS — E1122 Type 2 diabetes mellitus with diabetic chronic kidney disease: Secondary | ICD-10-CM | POA: Diagnosis not present

## 2022-03-30 DIAGNOSIS — Z23 Encounter for immunization: Secondary | ICD-10-CM | POA: Diagnosis not present

## 2022-03-30 DIAGNOSIS — Z992 Dependence on renal dialysis: Secondary | ICD-10-CM | POA: Diagnosis not present

## 2022-03-30 DIAGNOSIS — N186 End stage renal disease: Secondary | ICD-10-CM | POA: Diagnosis not present

## 2022-03-30 DIAGNOSIS — N2581 Secondary hyperparathyroidism of renal origin: Secondary | ICD-10-CM | POA: Diagnosis not present

## 2022-03-30 DIAGNOSIS — D631 Anemia in chronic kidney disease: Secondary | ICD-10-CM | POA: Diagnosis not present

## 2022-03-31 DIAGNOSIS — Z992 Dependence on renal dialysis: Secondary | ICD-10-CM | POA: Diagnosis not present

## 2022-03-31 DIAGNOSIS — N186 End stage renal disease: Secondary | ICD-10-CM | POA: Diagnosis not present

## 2022-03-31 DIAGNOSIS — M542 Cervicalgia: Secondary | ICD-10-CM | POA: Diagnosis not present

## 2022-03-31 DIAGNOSIS — M6281 Muscle weakness (generalized): Secondary | ICD-10-CM | POA: Diagnosis not present

## 2022-03-31 DIAGNOSIS — E1129 Type 2 diabetes mellitus with other diabetic kidney complication: Secondary | ICD-10-CM | POA: Diagnosis not present

## 2022-04-01 DIAGNOSIS — N2581 Secondary hyperparathyroidism of renal origin: Secondary | ICD-10-CM | POA: Diagnosis not present

## 2022-04-01 DIAGNOSIS — N186 End stage renal disease: Secondary | ICD-10-CM | POA: Diagnosis not present

## 2022-04-01 DIAGNOSIS — Z992 Dependence on renal dialysis: Secondary | ICD-10-CM | POA: Diagnosis not present

## 2022-04-01 DIAGNOSIS — D509 Iron deficiency anemia, unspecified: Secondary | ICD-10-CM | POA: Diagnosis not present

## 2022-04-01 DIAGNOSIS — D631 Anemia in chronic kidney disease: Secondary | ICD-10-CM | POA: Diagnosis not present

## 2022-04-01 DIAGNOSIS — E1122 Type 2 diabetes mellitus with diabetic chronic kidney disease: Secondary | ICD-10-CM | POA: Diagnosis not present

## 2022-04-02 DIAGNOSIS — I10 Essential (primary) hypertension: Secondary | ICD-10-CM | POA: Diagnosis not present

## 2022-04-02 DIAGNOSIS — Z01812 Encounter for preprocedural laboratory examination: Secondary | ICD-10-CM | POA: Diagnosis not present

## 2022-04-02 DIAGNOSIS — N186 End stage renal disease: Secondary | ICD-10-CM | POA: Diagnosis not present

## 2022-04-02 DIAGNOSIS — E1122 Type 2 diabetes mellitus with diabetic chronic kidney disease: Secondary | ICD-10-CM | POA: Diagnosis not present

## 2022-04-02 DIAGNOSIS — E119 Type 2 diabetes mellitus without complications: Secondary | ICD-10-CM | POA: Diagnosis not present

## 2022-04-02 DIAGNOSIS — I120 Hypertensive chronic kidney disease with stage 5 chronic kidney disease or end stage renal disease: Secondary | ICD-10-CM | POA: Diagnosis not present

## 2022-04-02 DIAGNOSIS — Z01818 Encounter for other preprocedural examination: Secondary | ICD-10-CM | POA: Diagnosis not present

## 2022-04-02 DIAGNOSIS — Z992 Dependence on renal dialysis: Secondary | ICD-10-CM | POA: Diagnosis not present

## 2022-04-03 DIAGNOSIS — E1122 Type 2 diabetes mellitus with diabetic chronic kidney disease: Secondary | ICD-10-CM | POA: Diagnosis not present

## 2022-04-03 DIAGNOSIS — D631 Anemia in chronic kidney disease: Secondary | ICD-10-CM | POA: Diagnosis not present

## 2022-04-03 DIAGNOSIS — Z992 Dependence on renal dialysis: Secondary | ICD-10-CM | POA: Diagnosis not present

## 2022-04-03 DIAGNOSIS — N2581 Secondary hyperparathyroidism of renal origin: Secondary | ICD-10-CM | POA: Diagnosis not present

## 2022-04-03 DIAGNOSIS — D509 Iron deficiency anemia, unspecified: Secondary | ICD-10-CM | POA: Diagnosis not present

## 2022-04-03 DIAGNOSIS — N186 End stage renal disease: Secondary | ICD-10-CM | POA: Diagnosis not present

## 2022-04-06 ENCOUNTER — Encounter: Payer: Self-pay | Admitting: Nurse Practitioner

## 2022-04-06 ENCOUNTER — Ambulatory Visit (INDEPENDENT_AMBULATORY_CARE_PROVIDER_SITE_OTHER): Payer: Medicare Other | Admitting: Nurse Practitioner

## 2022-04-06 VITALS — BP 134/68 | HR 67 | Temp 98.4°F | Ht 60.0 in | Wt 151.0 lb

## 2022-04-06 DIAGNOSIS — D631 Anemia in chronic kidney disease: Secondary | ICD-10-CM | POA: Diagnosis not present

## 2022-04-06 DIAGNOSIS — N189 Chronic kidney disease, unspecified: Secondary | ICD-10-CM

## 2022-04-06 DIAGNOSIS — E119 Type 2 diabetes mellitus without complications: Secondary | ICD-10-CM

## 2022-04-06 DIAGNOSIS — N186 End stage renal disease: Secondary | ICD-10-CM | POA: Diagnosis not present

## 2022-04-06 DIAGNOSIS — Z780 Asymptomatic menopausal state: Secondary | ICD-10-CM

## 2022-04-06 DIAGNOSIS — Z992 Dependence on renal dialysis: Secondary | ICD-10-CM | POA: Diagnosis not present

## 2022-04-06 DIAGNOSIS — Z1231 Encounter for screening mammogram for malignant neoplasm of breast: Secondary | ICD-10-CM

## 2022-04-06 DIAGNOSIS — E1122 Type 2 diabetes mellitus with diabetic chronic kidney disease: Secondary | ICD-10-CM | POA: Diagnosis not present

## 2022-04-06 DIAGNOSIS — G629 Polyneuropathy, unspecified: Secondary | ICD-10-CM

## 2022-04-06 DIAGNOSIS — I10 Essential (primary) hypertension: Secondary | ICD-10-CM

## 2022-04-06 DIAGNOSIS — R159 Full incontinence of feces: Secondary | ICD-10-CM | POA: Diagnosis not present

## 2022-04-06 DIAGNOSIS — E782 Mixed hyperlipidemia: Secondary | ICD-10-CM | POA: Diagnosis not present

## 2022-04-06 DIAGNOSIS — Z0000 Encounter for general adult medical examination without abnormal findings: Secondary | ICD-10-CM | POA: Diagnosis not present

## 2022-04-06 DIAGNOSIS — D509 Iron deficiency anemia, unspecified: Secondary | ICD-10-CM | POA: Diagnosis not present

## 2022-04-06 DIAGNOSIS — N2581 Secondary hyperparathyroidism of renal origin: Secondary | ICD-10-CM | POA: Diagnosis not present

## 2022-04-06 MED ORDER — METAMUCIL 28 % PO PACK: 1.0000 | PACK | Freq: Two times a day (BID) | ORAL | 3 refills | Status: DC

## 2022-04-06 NOTE — Assessment & Plan Note (Signed)
Intermittent burning sensation left foot Continue gabapentin 300 mg at bedtime

## 2022-04-06 NOTE — Assessment & Plan Note (Signed)
Lab Results  Component Value Date   CHOL 252 (H) 04/06/2019   HDL 53 04/06/2019   LDLCALC 152 (H) 04/06/2019   TRIG 257 (H) 04/06/2019   CHOLHDL 4.8 (H) 04/06/2019  Currently not on any statin Check lipid panel at next visit LDL goal is less than 70 Records obtained from previous PCP

## 2022-04-06 NOTE — Progress Notes (Unsigned)
Rachael Burke is a 66 y.o. female with past medical history of type 2 diabetes, end-stage renal disease, mixed hyperlipidemia, non-intractable headache, pulmonary nodules who presents for annual wellness visit and to establish care for her chronic medical conditions.  Previous PCP, Weyman Pedro at [facility], last visit was over a month ago.  She is accompanied by her daughter and a Spanish medical interpreter.   End-stage renal disease.  Goes to HD on Monday Wednesday Fridays has left upper arm fistula still makes some urine.  States that she has been on dialysis for the past 2 years.  Patient currently denies shortness of breath, edema, chest pain.  Takes amlodipine 10 mg daily, carvedilol 25 mg twice daily, isosorbide-hydralazine 20-37.5 mg 3 times daily.  She reports that her headache has improved recently, due to changes made by nephrology for her dialysis.  She is also followed by cardiology last visit was 2 months ago. She has seen ENT and neurology for complaints of HA and dizziness.   Patient complains of intermittent burning sensation of left foot that occurs only during dialysis for the past 3 weeks.  She denies numbness, tingling, trauma.  She is currently on gabapentin 300 mg daily at bedtime.  Patient complains of fecal incontinence for the past 2 months, she denies nausea, vomiting, abdominal pain, fever, chills, diarrhea, blood in the stool. States that her BM is mushy like in consistency.  Last colonoscopy anoscopy was 2 years ago   Has upcoming hysterectomy on this Friday for cervical cancer . Denies abdominal pain, vaginal bleeding.    Due for screening mammogram, DEXA scan, referral sent for mammogram and DEXA scan today.   States that she is not sure if she has had flu vaccine, has one dose of  shingles vaccine, documented , records requested from her previous PCP today.        Immunizations and Health Maintenance Immunization History   Administered Date(s) Administered   Fluad Quad(high Dose 65+) 06/18/2020   Hepb-cpg 09/30/2021   Influenza, High Dose Seasonal PF 06/18/2020   Influenza,inj,Quad PF,6+ Mos 04/10/2019   Moderna Sars-Covid-2 Vaccination 10/11/2019, 11/08/2019   PNEUMOCOCCAL CONJUGATE-20 09/30/2021   Pfizer Covid-19 Vaccine Bivalent Booster 59yrs & up 09/30/2021   Pneumococcal Polysaccharide-23 04/10/2019   Unspecified SARS-COV-2 Vaccination 11/08/2019   Zoster Recombinat (Shingrix) 09/30/2021   Health Maintenance Due  Topic Date Due   OPHTHALMOLOGY EXAM  08/06/2020   DEXA SCAN  Never done   Zoster Vaccines- Shingrix (2 of 2) 11/25/2021   INFLUENZA VACCINE  12/30/2021   COVID-19 Vaccine (5 - Moderna series) 01/31/2022   HEMOGLOBIN A1C  03/23/2022    Last Pap smear: ob\gyn with [facility] Last mammogram: 08/13/2020 Last colonoscopy: GI Dr. Silvio Pate  Last DEXA: N/A Dentist: N/A Ophtho: vision works and [place] eye Exercise: N/A  Other doctors caring for patient include:  Advanced directives: Does Patient Have a Catering manager?: No Would patient like information on creating a medical advance directive?: No - Patient declined  Depression screen:  See questionnaire below.     04/06/2022    2:45 PM 07/23/2020    4:24 PM 06/18/2020    2:07 PM 05/15/2020    3:57 PM 05/11/2019    4:30 PM  Depression screen PHQ 2/9  Decreased Interest 0 0 0 0 0  Down, Depressed, Hopeless 0 0 0 0 0  PHQ - 2 Score 0 0 0 0 0    Fall Risk Screen: see questionnaire below.  04/06/2022    2:44 PM 03/26/2022   11:19 AM 07/23/2020    4:23 PM 06/18/2020    2:06 PM 05/15/2020    3:57 PM  Fall Risk   Falls in the past year? 0 0 0 0 1  Number falls in past yr: 0 0  0 0  Injury with Fall? 0 0  0 0  Risk for fall due to : No Fall Risks      Follow up Falls evaluation completed  Falls evaluation completed Falls evaluation completed Falls evaluation completed    ADL screen:  See questionnaire below Functional  Status Survey: Is the patient deaf or have difficulty hearing?: Yes Does the patient have difficulty seeing, even when wearing glasses/contacts?: No Does the patient have difficulty concentrating, remembering, or making decisions?: No Does the patient have difficulty walking or climbing stairs?: Yes Does the patient have difficulty dressing or bathing?: No Does the patient have difficulty doing errands alone such as visiting a doctor's office or shopping?: Yes   Review of Systems Constitutional: -, denies unexpected weight change, -anorexia, -fatigue Cardiology:  - denies chest pain, -palpitations, -orthopnea, Respiratory: - denies cough, -shortness of breath, -dyspnea on exertion, -wheezing,  Gastroenterology: -denies abdominal pain, -nausea, -vomiting, constipation, -dysphagia, has fecal incontinence Musculoskeletal:  denies arthralgias, -myalgias, -joint swelling, -back pain, - Neurology: -, has burning sensation of the left foot, HA,dizziness. Denies numbness, tingling, seizures.     PHYSICAL EXAM:  BP 134/68   Pulse 67   Temp 98.4 F (36.9 C)   Ht 5' (1.524 m)   Wt 151 lb (68.5 kg)   SpO2 97%   BMI 29.49 kg/m   General Appearance: Alert, cooperative, no distress, appears stated age Head: Normocephalic, without obvious abnormality, atraumatic Eyes: PERRL, conjunctiva/corneas clear, EOM's intact,  Lungs: Clear to auscultation bilaterally without wheezes, rales or ronchi; respirations unlabored Heart: Regular rate and rhythm,  has murmur,  Abdomen: Soft, non-tender, nondistended, normoactive bowel sounds,  no masses, no hepatosplenomegaly Extremities: No clubbing, cyanosis or edema skin warm and dry  Pulses: 2+ and symmetric all extremities Skin:  Skin color, texture, turgor normal, no rashes or lesions Neurologic:  normal strength, sensation and gait;  and symmetric throughout Psych: Normal mood, affect, hygiene and grooming.  ASSESSMENT/PLAN:  Type 2 diabetes  mellitus without obesity (HCC) Lab Results  Component Value Date   HGBA1C 4.5 (L) 09/21/2021   A1C 4.9 a month ago ,Currently on Tradjenta 5 mg daily Stop Tradjenta Patient counseled on low-carb modified diet Diabetic foot exam completed today Follow-up in 3 months  Essential hypertension BP Readings from Last 3 Encounters:  04/06/22 134/68  03/26/22 (!) 161/68  03/02/22 (!) 162/66  Chronic medical condition well-controlled Continue carvedilol 25 mg twice daily, amlodipine 10 mg daily, isosorbide-hydralazine 20-37.5 mg 3 times daily. DASH diet advised engage in regular moderate exercise with at least 150 minutes weekly.  Appreciate collaboration with cardiology and nephrology Follow-up in 3 months Lab Results  Component Value Date   NA 138 03/02/2022   K 4.3 03/02/2022   CO2 30 03/02/2022   BUN 24 (H) 03/02/2022   CREATININE 4.14 (H) 03/02/2022   CALCIUM 8.8 (L) 03/02/2022   GLUCOSE 76 03/02/2022     ESRD (end stage renal disease) ([place]) Continue hemodialysis on Mondays Wednesdays Fridays She denies shortness of breath, chest pain, edema  Mixed hyperlipidemia Lab Results  Component Value Date   CHOL 252 (H) 04/06/2019   HDL 53 04/06/2019   LDLCALC 152 (H) 04/06/2019  TRIG 257 (H) 04/06/2019   CHOLHDL 4.8 (H) 04/06/2019  Currently not on any statin Check lipid panel at next visit LDL goal is less than 70 Records obtained from previous PCP  Full incontinence of feces RX psyllium (METAMUCIL) 28 % packet         Take 1 packet by mouth 2 (two) times daily.  Recurrent incontinence that started about 2 months ago, Patient referred to GI.   Neuropathy Intermittent burning sensation left foot Continue gabapentin 300 mg at bedtime   Anemia associated with chronic renal failure Lab Results  Component Value Date   WBC 9.0 01/03/2022   HGB 10.2 (L) 03/02/2022   HCT 30.0 (L) 03/02/2022   MCV 86.8 01/03/2022   PLT 150 01/03/2022  Will defer management to  nephrology undergoes dialysis 3 times weekly    Discussed monthly self breast exams and yearly mammograms; at least 30 minutes of aerobic activity at least 5 days/week and weight-bearing exercise 2x/week; proper sunscreen use reviewed; healthy diet, including goals of calcium and vitamin D intake and alcohol recommendations (less than or equal to 1 drink/day) reviewed; regular seatbelt use; changing batteries in smoke detectors.  Immunization recommendations discussed.  Colonoscopy recommendations reviewed   Medicare Attestation I have personally reviewed: The patient's medical and social history Their use of alcohol, tobacco or illicit drugs Their current medications and supplements The patient's functional ability including ADLs,fall risks, home safety risks, cognitive, and hearing and visual impairment Diet and physical activities Evidence for depression or mood disorders  The patient's weight, height, and BMI have been recorded in the chart.  I have made referrals, counseling, and provided education to the patient based on review of the above and I have provided the patient with a written personalized care plan for preventive services.          Patient Care Team: Renee Rival, FNP as PCP - General (Nurse Practitioner) Donato Heinz, MD as Consulting Physician (Cardiology) [place]  I have reviewed and agree with the above AWV documentation. Mikle Sternberg R. Christiana Fuchs, [place] Location Provider: office Location Patient: office

## 2022-04-06 NOTE — Assessment & Plan Note (Signed)
RX psyllium (METAMUCIL) 28 % packet         Take 1 packet by mouth 2 (two) times daily.  Recurrent incontinence that started about 2 months ago, Patient referred to GI.

## 2022-04-06 NOTE — Assessment & Plan Note (Signed)
BP Readings from Last 3 Encounters:  04/06/22 134/68  03/26/22 (!) 161/68  03/02/22 (!) 162/66  Chronic medical condition well-controlled Continue carvedilol 25 mg twice daily, amlodipine 10 mg daily, isosorbide-hydralazine 20-37.5 mg 3 times daily. DASH diet advised engage in regular moderate exercise with at least 150 minutes weekly.  Appreciate collaboration with cardiology and nephrology Follow-up in 3 months Lab Results  Component Value Date   NA 138 03/02/2022   K 4.3 03/02/2022   CO2 30 03/02/2022   BUN 24 (H) 03/02/2022   CREATININE 4.14 (H) 03/02/2022   CALCIUM 8.8 (L) 03/02/2022   GLUCOSE 76 03/02/2022

## 2022-04-06 NOTE — Assessment & Plan Note (Signed)
Continue hemodialysis on Mondays Wednesdays Fridays She denies shortness of breath, chest pain, edema

## 2022-04-06 NOTE — Assessment & Plan Note (Addendum)
Lab Results  Component Value Date   HGBA1C 4.5 (L) 09/21/2021   A1C 4.9 a month ago ,Currently on Tradjenta 5 mg daily Stop Tradjenta Patient counseled on low-carb modified diet Diabetic foot exam completed today Follow-up in 3 months

## 2022-04-06 NOTE — Assessment & Plan Note (Signed)
Lab Results  Component Value Date   WBC 9.0 01/03/2022   HGB 10.2 (L) 03/02/2022   HCT 30.0 (L) 03/02/2022   MCV 86.8 01/03/2022   PLT 150 01/03/2022  Will defer management to nephrology undergoes dialysis 3 times weekly

## 2022-04-06 NOTE — Patient Instructions (Addendum)
Please stop taking tradjenta. Please schedule your DEXA scan and mammogram today. Sign release of information form to get your records from your previous PCP.   It is important that you exercise regularly at least 30 minutes 5 times a week as tolerated  Think about what you will eat, plan ahead. Choose " clean, green, fresh or frozen" over canned, processed or packaged foods which are more sugary, salty and fatty. 70 to 75% of food eaten should be vegetables and fruit. Three meals at set times with snacks allowed between meals, but they must be fruit or vegetables. Aim to eat over a 12 hour period , example 7 am to 7 pm, and STOP after  your last meal of the day. Drink water,generally about 64 ounces per day, no other drink is as healthy. Fruit juice is best enjoyed in a healthy way, by EATING the fruit.  Thanks for choosing [facility] we consider it a privelige to serve you.

## 2022-04-07 DIAGNOSIS — M6281 Muscle weakness (generalized): Secondary | ICD-10-CM | POA: Diagnosis not present

## 2022-04-07 DIAGNOSIS — M542 Cervicalgia: Secondary | ICD-10-CM | POA: Diagnosis not present

## 2022-04-09 DIAGNOSIS — N186 End stage renal disease: Secondary | ICD-10-CM | POA: Diagnosis not present

## 2022-04-09 DIAGNOSIS — Z992 Dependence on renal dialysis: Secondary | ICD-10-CM | POA: Diagnosis not present

## 2022-04-09 DIAGNOSIS — D509 Iron deficiency anemia, unspecified: Secondary | ICD-10-CM | POA: Diagnosis not present

## 2022-04-09 DIAGNOSIS — D631 Anemia in chronic kidney disease: Secondary | ICD-10-CM | POA: Diagnosis not present

## 2022-04-09 DIAGNOSIS — E1122 Type 2 diabetes mellitus with diabetic chronic kidney disease: Secondary | ICD-10-CM | POA: Diagnosis not present

## 2022-04-09 DIAGNOSIS — N2581 Secondary hyperparathyroidism of renal origin: Secondary | ICD-10-CM | POA: Diagnosis not present

## 2022-04-10 DIAGNOSIS — E785 Hyperlipidemia, unspecified: Secondary | ICD-10-CM | POA: Diagnosis not present

## 2022-04-10 DIAGNOSIS — Z9841 Cataract extraction status, right eye: Secondary | ICD-10-CM | POA: Diagnosis not present

## 2022-04-10 DIAGNOSIS — J811 Chronic pulmonary edema: Secondary | ICD-10-CM | POA: Diagnosis not present

## 2022-04-10 DIAGNOSIS — E1122 Type 2 diabetes mellitus with diabetic chronic kidney disease: Secondary | ICD-10-CM | POA: Diagnosis not present

## 2022-04-10 DIAGNOSIS — I361 Nonrheumatic tricuspid (valve) insufficiency: Secondary | ICD-10-CM | POA: Diagnosis not present

## 2022-04-10 DIAGNOSIS — I358 Other nonrheumatic aortic valve disorders: Secondary | ICD-10-CM | POA: Diagnosis not present

## 2022-04-10 DIAGNOSIS — I3139 Other pericardial effusion (noninflammatory): Secondary | ICD-10-CM | POA: Diagnosis not present

## 2022-04-10 DIAGNOSIS — K5900 Constipation, unspecified: Secondary | ICD-10-CM | POA: Diagnosis not present

## 2022-04-10 DIAGNOSIS — Z9049 Acquired absence of other specified parts of digestive tract: Secondary | ICD-10-CM | POA: Diagnosis not present

## 2022-04-10 DIAGNOSIS — R208 Other disturbances of skin sensation: Secondary | ICD-10-CM | POA: Diagnosis not present

## 2022-04-10 DIAGNOSIS — N186 End stage renal disease: Secondary | ICD-10-CM | POA: Diagnosis not present

## 2022-04-10 DIAGNOSIS — Z992 Dependence on renal dialysis: Secondary | ICD-10-CM | POA: Diagnosis not present

## 2022-04-10 DIAGNOSIS — N8110 Cystocele, unspecified: Secondary | ICD-10-CM | POA: Diagnosis not present

## 2022-04-10 DIAGNOSIS — I120 Hypertensive chronic kidney disease with stage 5 chronic kidney disease or end stage renal disease: Secondary | ICD-10-CM | POA: Diagnosis not present

## 2022-04-10 DIAGNOSIS — Z87440 Personal history of urinary (tract) infections: Secondary | ICD-10-CM | POA: Diagnosis not present

## 2022-04-10 DIAGNOSIS — N816 Rectocele: Secondary | ICD-10-CM | POA: Diagnosis not present

## 2022-04-10 DIAGNOSIS — E875 Hyperkalemia: Secondary | ICD-10-CM | POA: Diagnosis not present

## 2022-04-10 DIAGNOSIS — D631 Anemia in chronic kidney disease: Secondary | ICD-10-CM | POA: Diagnosis not present

## 2022-04-10 DIAGNOSIS — Z9842 Cataract extraction status, left eye: Secondary | ICD-10-CM | POA: Diagnosis not present

## 2022-04-10 DIAGNOSIS — I340 Nonrheumatic mitral (valve) insufficiency: Secondary | ICD-10-CM | POA: Diagnosis not present

## 2022-04-13 DIAGNOSIS — T82868A Thrombosis of vascular prosthetic devices, implants and grafts, initial encounter: Secondary | ICD-10-CM | POA: Diagnosis not present

## 2022-04-13 DIAGNOSIS — D509 Iron deficiency anemia, unspecified: Secondary | ICD-10-CM | POA: Diagnosis not present

## 2022-04-13 DIAGNOSIS — D631 Anemia in chronic kidney disease: Secondary | ICD-10-CM | POA: Diagnosis not present

## 2022-04-13 DIAGNOSIS — Z992 Dependence on renal dialysis: Secondary | ICD-10-CM | POA: Diagnosis not present

## 2022-04-13 DIAGNOSIS — N186 End stage renal disease: Secondary | ICD-10-CM | POA: Diagnosis not present

## 2022-04-13 DIAGNOSIS — E1122 Type 2 diabetes mellitus with diabetic chronic kidney disease: Secondary | ICD-10-CM | POA: Diagnosis not present

## 2022-04-13 DIAGNOSIS — I871 Compression of vein: Secondary | ICD-10-CM | POA: Diagnosis not present

## 2022-04-13 DIAGNOSIS — N2581 Secondary hyperparathyroidism of renal origin: Secondary | ICD-10-CM | POA: Diagnosis not present

## 2022-04-15 DIAGNOSIS — N2581 Secondary hyperparathyroidism of renal origin: Secondary | ICD-10-CM | POA: Diagnosis not present

## 2022-04-15 DIAGNOSIS — E1122 Type 2 diabetes mellitus with diabetic chronic kidney disease: Secondary | ICD-10-CM | POA: Diagnosis not present

## 2022-04-15 DIAGNOSIS — D631 Anemia in chronic kidney disease: Secondary | ICD-10-CM | POA: Diagnosis not present

## 2022-04-15 DIAGNOSIS — N186 End stage renal disease: Secondary | ICD-10-CM | POA: Diagnosis not present

## 2022-04-15 DIAGNOSIS — Z992 Dependence on renal dialysis: Secondary | ICD-10-CM | POA: Diagnosis not present

## 2022-04-15 DIAGNOSIS — D509 Iron deficiency anemia, unspecified: Secondary | ICD-10-CM | POA: Diagnosis not present

## 2022-04-17 DIAGNOSIS — Z992 Dependence on renal dialysis: Secondary | ICD-10-CM | POA: Diagnosis not present

## 2022-04-17 DIAGNOSIS — N2581 Secondary hyperparathyroidism of renal origin: Secondary | ICD-10-CM | POA: Diagnosis not present

## 2022-04-17 DIAGNOSIS — N186 End stage renal disease: Secondary | ICD-10-CM | POA: Diagnosis not present

## 2022-04-17 DIAGNOSIS — E1122 Type 2 diabetes mellitus with diabetic chronic kidney disease: Secondary | ICD-10-CM | POA: Diagnosis not present

## 2022-04-17 DIAGNOSIS — D631 Anemia in chronic kidney disease: Secondary | ICD-10-CM | POA: Diagnosis not present

## 2022-04-17 DIAGNOSIS — D509 Iron deficiency anemia, unspecified: Secondary | ICD-10-CM | POA: Diagnosis not present

## 2022-04-19 ENCOUNTER — Other Ambulatory Visit: Payer: Self-pay

## 2022-04-19 ENCOUNTER — Emergency Department (HOSPITAL_COMMUNITY)
Admission: EM | Admit: 2022-04-19 | Discharge: 2022-04-19 | Disposition: A | Discharge status: Home / Self Care | Payer: Medicare Other | Attending: Emergency Medicine | Admitting: Emergency Medicine

## 2022-04-19 ENCOUNTER — Emergency Department (HOSPITAL_COMMUNITY): Payer: Medicare Other

## 2022-04-19 DIAGNOSIS — K6289 Other specified diseases of anus and rectum: Secondary | ICD-10-CM | POA: Diagnosis not present

## 2022-04-19 DIAGNOSIS — Z9049 Acquired absence of other specified parts of digestive tract: Secondary | ICD-10-CM | POA: Diagnosis not present

## 2022-04-19 DIAGNOSIS — I10 Essential (primary) hypertension: Secondary | ICD-10-CM | POA: Diagnosis not present

## 2022-04-19 DIAGNOSIS — Z7984 Long term (current) use of oral hypoglycemic drugs: Secondary | ICD-10-CM | POA: Diagnosis not present

## 2022-04-19 DIAGNOSIS — Z79899 Other long term (current) drug therapy: Secondary | ICD-10-CM | POA: Insufficient documentation

## 2022-04-19 DIAGNOSIS — E119 Type 2 diabetes mellitus without complications: Secondary | ICD-10-CM | POA: Insufficient documentation

## 2022-04-19 DIAGNOSIS — R6889 Other general symptoms and signs: Secondary | ICD-10-CM | POA: Diagnosis not present

## 2022-04-19 DIAGNOSIS — N186 End stage renal disease: Secondary | ICD-10-CM | POA: Diagnosis not present

## 2022-04-19 DIAGNOSIS — K6389 Other specified diseases of intestine: Secondary | ICD-10-CM | POA: Diagnosis not present

## 2022-04-19 DIAGNOSIS — G8918 Other acute postprocedural pain: Secondary | ICD-10-CM

## 2022-04-19 DIAGNOSIS — T699XXA Effect of reduced temperature, unspecified, initial encounter: Secondary | ICD-10-CM | POA: Diagnosis not present

## 2022-04-19 DIAGNOSIS — D631 Anemia in chronic kidney disease: Secondary | ICD-10-CM | POA: Diagnosis not present

## 2022-04-19 DIAGNOSIS — D509 Iron deficiency anemia, unspecified: Secondary | ICD-10-CM | POA: Diagnosis not present

## 2022-04-19 DIAGNOSIS — N2581 Secondary hyperparathyroidism of renal origin: Secondary | ICD-10-CM | POA: Diagnosis not present

## 2022-04-19 DIAGNOSIS — E1122 Type 2 diabetes mellitus with diabetic chronic kidney disease: Secondary | ICD-10-CM | POA: Diagnosis not present

## 2022-04-19 DIAGNOSIS — Z743 Need for continuous supervision: Secondary | ICD-10-CM | POA: Diagnosis not present

## 2022-04-19 DIAGNOSIS — Z992 Dependence on renal dialysis: Secondary | ICD-10-CM | POA: Diagnosis not present

## 2022-04-19 DIAGNOSIS — K5900 Constipation, unspecified: Secondary | ICD-10-CM

## 2022-04-19 LAB — CBC WITH DIFFERENTIAL/PLATELET
Abs Immature Granulocytes: 0.02 10*3/uL (ref 0.00–0.07)
Basophils Absolute: 0 10*3/uL (ref 0.0–0.1)
Basophils Relative: 1 %
Eosinophils Absolute: 0.6 10*3/uL — ABNORMAL HIGH (ref 0.0–0.5)
Eosinophils Relative: 7 %
HCT: 34.8 % — ABNORMAL LOW (ref 36.0–46.0)
Hemoglobin: 11.4 g/dL — ABNORMAL LOW (ref 12.0–15.0)
Immature Granulocytes: 0 %
Lymphocytes Relative: 12 %
Lymphs Abs: 1 10*3/uL (ref 0.7–4.0)
MCH: 30.3 pg (ref 26.0–34.0)
MCHC: 32.8 g/dL (ref 30.0–36.0)
MCV: 92.6 fL (ref 80.0–100.0)
Monocytes Absolute: 0.7 10*3/uL (ref 0.1–1.0)
Monocytes Relative: 8 %
Neutro Abs: 6.2 10*3/uL (ref 1.7–7.7)
Neutrophils Relative %: 72 %
Platelets: 189 10*3/uL (ref 150–400)
RBC: 3.76 MIL/uL — ABNORMAL LOW (ref 3.87–5.11)
RDW: 14.6 % (ref 11.5–15.5)
WBC: 8.5 10*3/uL (ref 4.0–10.5)
nRBC: 0 % (ref 0.0–0.2)

## 2022-04-19 LAB — COMPREHENSIVE METABOLIC PANEL
ALT: 6 U/L (ref 0–44)
AST: 16 U/L (ref 15–41)
Albumin: 3.7 g/dL (ref 3.5–5.0)
Alkaline Phosphatase: 51 U/L (ref 38–126)
Anion gap: 14 (ref 5–15)
BUN: 17 mg/dL (ref 8–23)
CO2: 31 mmol/L (ref 22–32)
Calcium: 8.8 mg/dL — ABNORMAL LOW (ref 8.9–10.3)
Chloride: 92 mmol/L — ABNORMAL LOW (ref 98–111)
Creatinine, Ser: 4.65 mg/dL — ABNORMAL HIGH (ref 0.44–1.00)
GFR, Estimated: 10 mL/min — ABNORMAL LOW (ref >60)
Glucose, Bld: 141 mg/dL — ABNORMAL HIGH (ref 70–99)
Potassium: 3.7 mmol/L (ref 3.5–5.1)
Sodium: 137 mmol/L (ref 135–145)
Total Bilirubin: 0.4 mg/dL (ref 0.3–1.2)
Total Protein: 7.3 g/dL (ref 6.5–8.1)

## 2022-04-19 LAB — LIPASE, BLOOD: Lipase: 34 U/L (ref 11–51)

## 2022-04-19 MED ORDER — ACETAMINOPHEN 325 MG PO TABS: 650.0000 mg | ORAL_TABLET | Freq: Four times a day (QID) | ORAL | 0 refills | Status: DC | PRN

## 2022-04-19 MED ORDER — ONDANSETRON HCL 4 MG/2ML IJ SOLN
4.0000 mg | Freq: Once | INTRAMUSCULAR | Status: AC
  Administered 2022-04-19: 4 mg via INTRAVENOUS
  Filled 2022-04-19: qty 2

## 2022-04-19 MED ORDER — POLYETHYLENE GLYCOL 3350 17 GM/SCOOP PO POWD: 17.0000 g | Freq: Every day | ORAL | 0 refills | Status: AC

## 2022-04-19 MED ORDER — MAGNESIUM CITRATE PO SOLN
0.5000 | Freq: Once | ORAL | Status: AC
  Administered 2022-04-19: 0.5 via ORAL
  Filled 2022-04-19 (×2): qty 296

## 2022-04-19 MED ORDER — HYDROMORPHONE HCL 1 MG/ML IJ SOLN
0.5000 mg | Freq: Once | INTRAMUSCULAR | Status: AC
  Administered 2022-04-19: 0.5 mg via INTRAVENOUS
  Filled 2022-04-19: qty 1

## 2022-04-19 MED ORDER — SODIUM CHLORIDE 0.9 % IV BOLUS
500.0000 mL | Freq: Once | INTRAVENOUS | Status: AC
  Administered 2022-04-19: 500 mL via INTRAVENOUS

## 2022-04-19 MED ORDER — METHYLNALTREXONE BROMIDE 12 MG/0.6ML ~~LOC~~ SOLN
12.0000 mg | Freq: Once | SUBCUTANEOUS | Status: AC
  Administered 2022-04-19: 12 mg via SUBCUTANEOUS
  Filled 2022-04-19: qty 0.6

## 2022-04-19 NOTE — Discharge Instructions (Addendum)
Please follow up with your surgeon tomorrow  It was a pleasure caring for you today in the emergency department.  Please return to the emergency department for any worsening or worrisome symptoms.    Please follow up with your primary care doctor within 2-3 days. For constipation we also recommend a diet high in fiber (beans, fruits, vegetables, whole grains). Take Colace 100-200 mg up to three times per day. You may take along with Senokot 1-2 tabs, ingest with full glass of water.  You may also take MiraLAX 1-2 capfuls 1-2 times a day until stools become soft and then slowly decrease the amount of MiraLAX used.  Maintain fluid intake 6-8 glasses per day. Please increase fibers in your diet. You may also take Milk of Magnesia 30 mL as needed for constipation, you may repeat in 2 hours again if no bowl movement.     Por favor haga un seguimiento con su cirujano maana.  Fue un placer atenderle hoy en el departamento de emergencias.  Regrese al departamento de emergencias si cualquier sntoma que empeore o sea preocupante.    Haga un seguimiento con su mdico de atencin primaria dentro de 2 a 3 das. Para el estreimiento tambin recomendamos una dieta rica en fibra (frijoles, frutas, verduras, cereales integrales). Tome Colace 100-200 mg hasta tres veces al [employer]. Puede tomar junto con Senokot 1-2 tabletas, ingerir con un vaso lleno de agua. Tambin puede tomar MiraLAX 1 o 2 cpsulas 1 o 2 veces al da [employer] heces se ablanden y luego disminuir lentamente la cantidad de [place]. Mantener la ingesta de lquidos entre 6 y 65 vasos al Training and development officer. Por favor aumente las fibras en su dieta. Tambin puede tomar Leche de Magnesia 30 mL segn sea necesario para el estreimiento, puede repetir nuevamente en 2 horas si no hay movimiento del recipiente.

## 2022-04-19 NOTE — ED Provider Notes (Signed)
[facility] EMERGENCY DEPARTMENT Provider Note   CSN: [id number] Arrival date & time: 04/19/22  1520     History  Chief Complaint  Patient presents with   Constipation    Rachael Burke is a 66 y.o. female.  Patient as above with significant medical history as below, including DM2, MWF hd (AV fistula L, last HD Sunday 2/2 holiday, no missed sessions) , hld, htn, anxiety, arthritis who presents to the ED with complaint of constipation. Hx chronic constipation, had hysterectomy and bladder surgery 11/10, since then worsening constipation. Last BM was 2-3 days ago, was very small, also had a small bm earlier today. No brbpr or melena. Pain primarily at her rectum area, ongoing since her surgery. She has been taking opiate since the surgery. Tried a prune supplement product yesterday which did not help with her constipation. No vomiting, no nausea, no chest pain or dib, no fevers or chills, no drainage from incisions. Having some scant vaginal bleeding that has been improving since her hysterectomy. Dr Freddrick March [facility])      Past Medical History:  Diagnosis Date   Anemia    Anxiety    Arthritis    Diabetes mellitus without complication ([place])    type 2   Heart murmur    echo 07/02/20: Mild MR/TR, mild-moderate AV sclerosis, bicuspid or functional bicuspid AV, no evidence of AS. Murmr felt due to AV.   History of blood transfusion    Hyperlipidemia    Hypertension    Pneumonia    PONV (postoperative nausea and vomiting)    Renal disorder    M-W-F    Past Surgical History:  Procedure Laterality Date   AV FISTULA PLACEMENT Left 05/20/2020   Procedure: INSERTION OF LEFT ARM ARTERIOVENOUS (AV) FISTULA;  Surgeon: Marty Heck, MD;  Location: [place];  Service: Vascular;  Laterality: Left;   [place] Left 08/05/2020   Procedure: SECOND STAGE BASILIC VEIN TRANSPOSITION;  Surgeon: Marty Heck, MD;  Location: [place];  Service:  Vascular;  Laterality: Left;   BIOPSY  09/22/2021   Procedure: BIOPSY;  Surgeon: Ladene Artist, MD;  Location: [facility] ENDOSCOPY;  Service: Gastroenterology;;   CESAREAN SECTION     CHOLECYSTECTOMY     ESOPHAGOGASTRODUODENOSCOPY (EGD) WITH PROPOFOL N/A 09/22/2021   Procedure: ESOPHAGOGASTRODUODENOSCOPY (EGD) WITH PROPOFOL;  Surgeon: Ladene Artist, MD;  Location: [facility] ENDOSCOPY;  Service: Gastroenterology;  Laterality: N/A;   INSERTION OF DIALYSIS CATHETER N/A 05/20/2020   Procedure: INSERTION OF DIALYSIS CATHETER;  Surgeon: Marty Heck, MD;  Location: [place];  Service: Vascular;  Laterality: N/A;   VIDEO BRONCHOSCOPY Bilateral 09/26/2021   Procedure: VIDEO BRONCHOSCOPY WITHOUT FLUORO;  Surgeon: Candee Furbish, MD;  Location: [facility] ENDOSCOPY;  Service: Pulmonary;  Laterality: Bilateral;     The history is provided by the patient and a relative. The history is limited by a language barrier. A language interpreter was used.  Constipation Associated symptoms: abdominal pain   Associated symptoms: no back pain, no dysuria, no fever and no nausea        Home Medications Prior to Admission medications   Medication Sig Start Date End Date Taking? Authorizing Provider  acetaminophen (TYLENOL) 325 MG tablet Take 2 tablets (650 mg total) by mouth every 6 (six) hours as needed. 04/19/22  Yes Wynona Dove A, DO  polyethylene glycol powder (MIRALAX) 17 GM/SCOOP powder Take 17 g by mouth daily for 7 doses. 04/19/22 04/26/22 Yes Jeanell Sparrow, DO  acetaminophen (TYLENOL) 325 MG tablet Take 2 tablets (650 mg total) by mouth every 6 (six) hours as needed for mild pain or moderate pain. 09/05/21   Carlisle Cater, PA-C  amLODipine (NORVASC) 10 MG tablet Take 1 tablet by mouth daily.    [provider]  calcium acetate (PHOSLO) 667 MG capsule Take 667 mg by mouth 3 (three) times daily. 04/02/21   [provider]  carvedilol (COREG) 25 MG tablet Take 1 tablet (25 mg total) by mouth 2 (two)  times daily with a meal. 01/04/22   Cherene Altes, MD  gabapentin (NEURONTIN) 100 MG capsule Take 300 mg by mouth at bedtime. 09/03/21   [provider]  isosorbide-hydrALAZINE (BIDIL) 20-37.5 MG tablet Take 1 tablet by mouth 3 (three) times daily. 01/04/22   Cherene Altes, MD  losartan (COZAAR) 100 MG tablet Take 1 tablet (100 mg total) by mouth at bedtime. Patient not taking: Reported on 03/26/2022 01/22/22 04/22/22  Custovic, Collene Mares, DO  nitroGLYCERIN (NITROSTAT) 0.4 MG SL tablet Place 1 tablet (0.4 mg total) under the tongue every 5 (five) minutes as needed for chest pain. Patient not taking: Reported on 04/06/2022 01/09/22 04/09/22  Custovic, Collene Mares, DO  olopatadine (PATANOL) 0.1 % ophthalmic solution Place 1 drop into both eyes 2 (two) times daily as needed for allergies. Patient not taking: Reported on 03/26/2022    [provider]  omeprazole (PRILOSEC OTC) 20 MG tablet Take 20 mg by mouth daily as needed (indigestion).    [provider]  ondansetron (ZOFRAN-ODT) 4 MG disintegrating tablet Take 1 tablet (4 mg total) by mouth every 8 (eight) hours as needed for nausea or vomiting. 03/02/22   Gareth Morgan, MD  psyllium (METAMUCIL) 28 % packet Take 1 packet by mouth 2 (two) times daily. 04/06/22   Renee Rival, FNP      Allergies    Patient has no known allergies.    Review of Systems   Review of Systems  Constitutional:  Negative for activity change and fever.  HENT:  Negative for facial swelling and trouble swallowing.   Eyes:  Negative for discharge and redness.  Respiratory:  Negative for cough and shortness of breath.   Cardiovascular:  Negative for chest pain and palpitations.  Gastrointestinal:  Positive for abdominal pain, constipation and rectal pain. Negative for nausea.  Genitourinary:  Negative for dysuria and flank pain.  Musculoskeletal:  Negative for back pain and gait problem.  Skin:  Negative for pallor and rash.  Neurological:   Negative for syncope and headaches.    Physical Exam Updated Vital Signs BP (!) 169/69   Pulse 66   Temp 98.2 F (36.8 C) (Oral)   Resp 11   SpO2 95%  Physical Exam Vitals and nursing note reviewed.  Constitutional:      General: She is not in acute distress.    Appearance: Normal appearance.  HENT:     Head: Normocephalic and atraumatic.     Right Ear: External ear normal.     Left Ear: External ear normal.     Nose: Nose normal.     Mouth/Throat:     Mouth: Mucous membranes are moist.  Eyes:     General: No scleral icterus.       Right eye: No discharge.        Left eye: No discharge.  Cardiovascular:     Rate and Rhythm: Normal rate and regular rhythm.     Pulses: Normal pulses.  Heart sounds: Normal heart sounds.  Pulmonary:     Effort: Pulmonary effort is normal. No respiratory distress.     Breath sounds: Normal breath sounds.  Abdominal:     General: Abdomen is flat.     Palpations: Abdomen is soft.     Tenderness: There is generalized abdominal tenderness.     Comments: Surgical sites/ports appear to be healing well, no crepitus or discharge. Glue in place   No crepitus   Musculoskeletal:        General: Normal range of motion.     Cervical back: Normal range of motion.     Right lower leg: No edema.     Left lower leg: No edema.  Skin:    General: Skin is warm and dry.     Capillary Refill: Capillary refill takes less than 2 seconds.  Neurological:     Mental Status: She is alert.  Psychiatric:        Mood and Affect: Mood normal.        Behavior: Behavior normal.     ED Results / Procedures / Treatments   Labs (all labs ordered are listed, but only abnormal results are displayed) Labs Reviewed  CBC WITH DIFFERENTIAL/PLATELET - Abnormal; Notable for the following components:      Result Value   RBC 3.76 (*)    Hemoglobin 11.4 (*)    HCT 34.8 (*)    Eosinophils Absolute 0.6 (*)    All other components within normal limits  COMPREHENSIVE  METABOLIC PANEL - Abnormal; Notable for the following components:   Chloride 92 (*)    Glucose, Bld 141 (*)    Creatinine, Ser 4.65 (*)    Calcium 8.8 (*)    GFR, Estimated 10 (*)    All other components within normal limits  LIPASE, BLOOD  URINALYSIS, ROUTINE W REFLEX MICROSCOPIC    EKG None  Radiology CT ABDOMEN PELVIS WO CONTRAST  Result Date: 04/19/2022 CLINICAL DATA: : CLINICAL DATA: Postop hysterectomy.  Bladder surgery.  Surgery 1 week prior. EXAM: CT ABDOMEN AND PELVIS WI THOUT CONTRAST TECHNIQUE: Multidetector CT imaging of the abdomen and pelvis was performed following the standard protocol without IV contrast. RADIATION DOSE REDUCTION: This exam was performed according to the departmental dose-optimization program which includes automated exposure control, adjustment of the mA and/or kV according to patient size and/or use of iterative reconstruction technique. COMPARISON:  CT 10/14/2021 FINDINGS: Lower chest: Lung bases are clear. Hepatobiliary: No focal hepatic lesion. Postcholecystectomy. No biliary dilatation. Pancreas: Pancreas is normal. No ductal dilatation. No pancreatic inflammation. Spleen: Normal spleen Adrenals/urinary tract: Adrenal glands and kidneys are normal. The ureters and bladder normal. Stomach/Bowel: The stomach, duodenum, and small bowel normal. Moderate volume stool in the ascending colon. Moderate volume the descending and transverse colon. Moderate to large volume stool in the rectum with the rectum distended to 6 cm with stool. No obstructing lesion identified. Vascular/Lymphatic: Abdominal aorta is normal caliber with atherosclerotic calcification. There is no retroperitoneal or periportal lymphadenopathy. No pelvic lymphadenopathy. Reproductive: Post recent hysterectomy. No abnormal fluid collections in the pelvis. Other: There is significant subcutaneous gas extending along the laparoscopic port tract superior to the umbilicus. This gas collection measures 3  cm in depth and 13 cm in width surrounding the tract (image 45/3). The RIGHT lateral laparoscopic port tract is normal (image 45/3). Small amount of interspersed intraperitoneal free free air related to recent laparoscopic surgery. Musculoskeletal: No aggressive osseous lesion. IMPRESSION: 1. Large volume of subcutaneous gas  associated with the supraumbilical laparoscopic port track. The volume of gas is more than typically encountered. 2. Moderate volume stool throughout the colon and rectum consistent with constipation. 3. No surgical complication in the pelvis related to hysterectomy. Electronically Signed   By: Suzy Bouchard M.D.   On: 04/19/2022 17:06    Procedures Procedures    Medications Ordered in ED Medications  methylnaltrexone (RELISTOR) injection 12 mg (12 mg Subcutaneous Given 04/19/22 1642)  sodium chloride 0.9 % bolus 500 mL (500 mLs Intravenous New Bag/Given 04/19/22 1644)  magnesium citrate solution 0.5 Bottle (0.5 Bottles Oral Given 04/19/22 1643)  HYDROmorphone (DILAUDID) injection 0.5 mg (0.5 mg Intravenous Given 04/19/22 1640)  ondansetron (ZOFRAN) injection 4 mg (4 mg Intravenous Given 04/19/22 1640)    ED Course/ Medical Decision Making/ A&P Clinical Course as of 04/19/22 2001  Sun Apr 19, 2022  1936 Abnormal CT, subq air that seems larger than expected per radiology. Will get in touch Ashley Akin, MD [SG]    Clinical Course User Index [SG] Jeanell Sparrow, DO                           Medical Decision Making Amount and/or Complexity of Data Reviewed Labs: ordered. Radiology: ordered.  Risk OTC drugs. Prescription drug management.   This patient presents to the ED with chief complaint(s) of abd pain, constipation with pertinent past medical history of recent abd surgery which further complicates the presenting complaint. The complaint involves an extensive differential diagnosis and also carries with it a high risk of complications and morbidity.     Differential diagnosis includes but is not exclusive to ectopic pregnancy, ovarian cyst, ovarian torsion, acute appendicitis, urinary tract infection, endometriosis, bowel obstruction, hernia, colitis, renal colic, gastroenteritis, volvulus, drug induced constipation, post op pain, ileus, obstruction, etc.  . Serious etiologies were considered.   The initial plan is to screening labs, ivf, mag cit/relistor   Additional history obtained: Additional history obtained from family Records reviewed previous admission documents and prior ed visits, prior labs/imaging/home meds  Independent labs interpretation:  The following labs were independently interpreted:  CMP w/ Cr similar to her baseline Hgb is 11.4, similar to baseline as well Lipase wnl  Independent visualization of imaging: - I independently visualized the following imaging with scope of interpretation limited to determining acute life threatening conditions related to emergency care: CTAP, which revealed large stool burden, no sbo, subq air noted from peri-umbilical port site.   Cardiac monitoring was reviewed and interpreted by myself which shows nsr  Treatment and Reassessment: Relistor Mag citrate Dilaudid Zofran Nacl >>> large BM, feeling much better  Consultation: - Consulted or discussed management/test interpretation w/ external professional: Spoke with partner of Dr Landry Dyke (Phone: [phone]) regarding abnormal CT, given stable vitals, stable exam and stable labs pt can follow up tomorrow with her scheduled appointment with Dr Landry Dyke for further evaluation.   Consideration for admission or further workup: Admission was considered   Pt here w/ abd pain + constipation. Pain likely combination of post op pain and pain a/w constipation. Feeling better after BM here in the ED. Feel her constipation is likely in part due to recently initiated opiate therapy following her recent abdominal surgery. She has f/u  tomorrow with surgical team, recommend she d/w them to address her pain if it persists post op. In addition will start on miralax and bowel regimen to encourage her to continue to move her bowels. No  n/v here today (was given zofran in conjunction with dilaudid to avoid possible nausea a/w this medication). Strict return precautions were d/w pt and family at bedside  The patient improved significantly and was discharged in stable condition. Detailed discussions were had with the patient regarding current findings, and need for close f/u with PCP or on call doctor. The patient has been instructed to return immediately if the symptoms worsen in any way for re-evaluation. Patient verbalized understanding and is in agreement with current care plan. All questions answered prior to discharge.    Social Determinants of health: Social History   Tobacco Use   Smoking status: Never   Smokeless tobacco: Never  Vaping Use   Vaping Use: Never used  Substance Use Topics   Alcohol use: Never   Drug use: Never            Final Clinical Impression(s) / ED Diagnoses Final diagnoses:  Constipation, unspecified constipation type  Post-op pain    Rx / DC Orders ED Discharge Orders          Ordered    polyethylene glycol powder (MIRALAX) 17 GM/SCOOP powder  Daily        04/19/22 1952    acetaminophen (TYLENOL) 325 MG tablet  Every 6 hours PRN        04/19/22 1954              Jeanell Sparrow, DO 04/19/22 2001

## 2022-04-19 NOTE — ED Notes (Signed)
Pt had a bowel movement.

## 2022-04-19 NOTE — ED Triage Notes (Signed)
Pt BIB [facility], pt had bladder surgery around a week ago. Pt is complaining of constipation. Has been taking oxycodone for pain. Pt knows very little of english. Restricted left arm.  BP157/67 HR72 O2 98 CBG 184

## 2022-04-21 DIAGNOSIS — E1122 Type 2 diabetes mellitus with diabetic chronic kidney disease: Secondary | ICD-10-CM | POA: Diagnosis not present

## 2022-04-21 DIAGNOSIS — N186 End stage renal disease: Secondary | ICD-10-CM | POA: Diagnosis not present

## 2022-04-21 DIAGNOSIS — Z992 Dependence on renal dialysis: Secondary | ICD-10-CM | POA: Diagnosis not present

## 2022-04-21 DIAGNOSIS — D509 Iron deficiency anemia, unspecified: Secondary | ICD-10-CM | POA: Diagnosis not present

## 2022-04-21 DIAGNOSIS — D631 Anemia in chronic kidney disease: Secondary | ICD-10-CM | POA: Diagnosis not present

## 2022-04-21 DIAGNOSIS — N2581 Secondary hyperparathyroidism of renal origin: Secondary | ICD-10-CM | POA: Diagnosis not present

## 2022-04-24 DIAGNOSIS — N2581 Secondary hyperparathyroidism of renal origin: Secondary | ICD-10-CM | POA: Diagnosis not present

## 2022-04-24 DIAGNOSIS — E1122 Type 2 diabetes mellitus with diabetic chronic kidney disease: Secondary | ICD-10-CM | POA: Diagnosis not present

## 2022-04-24 DIAGNOSIS — D509 Iron deficiency anemia, unspecified: Secondary | ICD-10-CM | POA: Diagnosis not present

## 2022-04-24 DIAGNOSIS — Z992 Dependence on renal dialysis: Secondary | ICD-10-CM | POA: Diagnosis not present

## 2022-04-24 DIAGNOSIS — D631 Anemia in chronic kidney disease: Secondary | ICD-10-CM | POA: Diagnosis not present

## 2022-04-24 DIAGNOSIS — N186 End stage renal disease: Secondary | ICD-10-CM | POA: Diagnosis not present

## 2022-04-27 DIAGNOSIS — D509 Iron deficiency anemia, unspecified: Secondary | ICD-10-CM | POA: Diagnosis not present

## 2022-04-27 DIAGNOSIS — N2581 Secondary hyperparathyroidism of renal origin: Secondary | ICD-10-CM | POA: Diagnosis not present

## 2022-04-27 DIAGNOSIS — D631 Anemia in chronic kidney disease: Secondary | ICD-10-CM | POA: Diagnosis not present

## 2022-04-27 DIAGNOSIS — Z992 Dependence on renal dialysis: Secondary | ICD-10-CM | POA: Diagnosis not present

## 2022-04-27 DIAGNOSIS — N8111 Cystocele, midline: Secondary | ICD-10-CM | POA: Diagnosis not present

## 2022-04-27 DIAGNOSIS — E1122 Type 2 diabetes mellitus with diabetic chronic kidney disease: Secondary | ICD-10-CM | POA: Diagnosis not present

## 2022-04-27 DIAGNOSIS — N186 End stage renal disease: Secondary | ICD-10-CM | POA: Diagnosis not present

## 2022-04-28 ENCOUNTER — Ambulatory Visit: Payer: Medicare Other | Admitting: Internal Medicine

## 2022-04-28 DIAGNOSIS — M6281 Muscle weakness (generalized): Secondary | ICD-10-CM | POA: Diagnosis not present

## 2022-04-28 DIAGNOSIS — G8918 Other acute postprocedural pain: Secondary | ICD-10-CM | POA: Diagnosis not present

## 2022-04-28 DIAGNOSIS — M542 Cervicalgia: Secondary | ICD-10-CM | POA: Diagnosis not present

## 2022-04-28 DIAGNOSIS — Z9889 Other specified postprocedural states: Secondary | ICD-10-CM | POA: Diagnosis not present

## 2022-04-29 ENCOUNTER — Ambulatory Visit: Payer: Medicare Other | Admitting: Internal Medicine

## 2022-04-29 ENCOUNTER — Encounter: Payer: Self-pay | Admitting: Internal Medicine

## 2022-04-29 VITALS — BP 128/59 | HR 68 | Ht 60.0 in | Wt 148.6 lb

## 2022-04-29 DIAGNOSIS — Z992 Dependence on renal dialysis: Secondary | ICD-10-CM

## 2022-04-29 DIAGNOSIS — D631 Anemia in chronic kidney disease: Secondary | ICD-10-CM | POA: Diagnosis not present

## 2022-04-29 DIAGNOSIS — E782 Mixed hyperlipidemia: Secondary | ICD-10-CM | POA: Diagnosis not present

## 2022-04-29 DIAGNOSIS — N186 End stage renal disease: Secondary | ICD-10-CM | POA: Diagnosis not present

## 2022-04-29 DIAGNOSIS — N2581 Secondary hyperparathyroidism of renal origin: Secondary | ICD-10-CM | POA: Diagnosis not present

## 2022-04-29 DIAGNOSIS — E1122 Type 2 diabetes mellitus with diabetic chronic kidney disease: Secondary | ICD-10-CM | POA: Diagnosis not present

## 2022-04-29 DIAGNOSIS — I10 Essential (primary) hypertension: Secondary | ICD-10-CM

## 2022-04-29 DIAGNOSIS — D509 Iron deficiency anemia, unspecified: Secondary | ICD-10-CM | POA: Diagnosis not present

## 2022-04-29 MED ORDER — CARVEDILOL 12.5 MG PO TABS: 12.5000 mg | ORAL_TABLET | Freq: Two times a day (BID) | ORAL | 3 refills | Status: DC

## 2022-04-29 NOTE — Progress Notes (Signed)
Primary Physician/Referring:  Renee Rival, FNP  Patient ID: Rachael Burke, female    DOB: 1956-05-24, 66 y.o.   MRN: [id number]  Chief Complaint  Patient presents with   Hypertension   Follow-up    HPI:    Rachael Burke  is a 66 y.o. female with past medical history significant for hypertension, hyperlipidemia, and end-stage renal disease who is here for follow-up visit. Her blood pressure is very well controlled now, even a little lower than our goal. Patient just had a hysterectomy about two weeks ago which went well, no complications. They had follow-up with the surgeon yesterday. Patient has been feeling well.  At this time she denies chest pain, shortness of breath, palpitations, diaphoresis, syncope, PND, edema, orthopnea.  Past Medical History:  Diagnosis Date   Anemia    Anxiety    Arthritis    Diabetes mellitus without complication (HCC)    type 2   Heart murmur    echo 07/02/20: Mild MR/TR, mild-moderate AV sclerosis, bicuspid or functional bicuspid AV, no evidence of AS. Murmr felt due to AV.   History of blood transfusion    Hyperlipidemia    Hypertension    Pneumonia    PONV (postoperative nausea and vomiting)    Renal disorder    M-W-F   Past Surgical History:  Procedure Laterality Date   AV FISTULA PLACEMENT Left 05/20/2020   Procedure: INSERTION OF LEFT ARM ARTERIOVENOUS (AV) FISTULA;  Surgeon: Marty Heck, MD;  Location: [place];  Service: Vascular;  Laterality: Left;   [place] Left 08/05/2020   Procedure: SECOND STAGE BASILIC VEIN TRANSPOSITION;  Surgeon: Marty Heck, MD;  Location: [place];  Service: Vascular;  Laterality: Left;   BIOPSY  09/22/2021   Procedure: BIOPSY;  Surgeon: Ladene Artist, MD;  Location: [facility] ENDOSCOPY;  Service: Gastroenterology;;   CESAREAN SECTION     CHOLECYSTECTOMY     ESOPHAGOGASTRODUODENOSCOPY (EGD) WITH PROPOFOL N/A 09/22/2021   Procedure: ESOPHAGOGASTRODUODENOSCOPY  (EGD) WITH PROPOFOL;  Surgeon: Ladene Artist, MD;  Location: [facility] ENDOSCOPY;  Service: Gastroenterology;  Laterality: N/A;   INSERTION OF DIALYSIS CATHETER N/A 05/20/2020   Procedure: INSERTION OF DIALYSIS CATHETER;  Surgeon: Marty Heck, MD;  Location: [place];  Service: Vascular;  Laterality: N/A;   VIDEO BRONCHOSCOPY Bilateral 09/26/2021   Procedure: VIDEO BRONCHOSCOPY WITHOUT FLUORO;  Surgeon: Candee Furbish, MD;  Location: [facility] ENDOSCOPY;  Service: Pulmonary;  Laterality: Bilateral;   Family History  Problem Relation Age of Onset   Diabetes Mother    Hypertension Father    Diabetes Sister    Diabetes Brother    Diabetes Brother     Social History   Tobacco Use   Smoking status: Never   Smokeless tobacco: Never  Substance Use Topics   Alcohol use: Never   Marital Status: Married  ROS  Review of Systems  Constitutional: Negative.  Cardiovascular: Negative.  Negative for chest pain, dyspnea on exertion, leg swelling, orthopnea and palpitations.  Respiratory: Negative.    Gastrointestinal: Negative.   Neurological: Negative.    Objective  Blood pressure (!) 128/59, pulse 68, height 5' (1.524 m), weight 148 lb 9.6 oz (67.4 kg), SpO2 95 %. Body mass index is 29.02 kg/m.     04/29/2022    2:39 PM 04/19/2022    8:00 PM 04/19/2022    7:00 PM  Vitals with BMI  Height 5\' 0"     Weight 148 lbs 10 oz  BMI 08.65    Systolic 784 696 295  Diastolic 59 79 69  Pulse 68 70 66     Physical Exam Vitals reviewed.  Constitutional:      Appearance: Normal appearance.  HENT:     Head: Normocephalic and atraumatic.  Neck:     Vascular: No carotid bruit.  Cardiovascular:     Rate and Rhythm: Normal rate and regular rhythm.     Pulses: Normal pulses.     Heart sounds: Murmur heard.     No friction rub. No gallop.  Pulmonary:     Effort: Pulmonary effort is normal.     Breath sounds: Normal breath sounds.  Abdominal:     General: Abdomen is flat. Bowel sounds are  normal.     Palpations: Abdomen is soft.  Musculoskeletal:     Right lower leg: No edema.     Left lower leg: No edema.  Skin:    General: Skin is warm and dry.  Neurological:     Mental Status: She is alert.    Medications and allergies  No Known Allergies   Medication list after today's encounter   Current Outpatient Medications:    acetaminophen (TYLENOL) 325 MG tablet, Take 2 tablets (650 mg total) by mouth every 6 (six) hours as needed for mild pain or moderate pain., Disp: 30 tablet, Rfl: 0   amLODipine (NORVASC) 10 MG tablet, Take 1 tablet by mouth daily., Disp: , Rfl:    calcium acetate (PHOSLO) 667 MG capsule, Take 667 mg by mouth 3 (three) times daily., Disp: , Rfl:    carvedilol (COREG) 12.5 MG tablet, Take 1 tablet (12.5 mg total) by mouth 2 (two) times daily., Disp: 180 tablet, Rfl: 3   gabapentin (NEURONTIN) 100 MG capsule, Take 300 mg by mouth at bedtime., Disp: , Rfl:    isosorbide-hydrALAZINE (BIDIL) 20-37.5 MG tablet, Take 1 tablet by mouth 3 (three) times daily., Disp: 90 tablet, Rfl: 2   omeprazole (PRILOSEC OTC) 20 MG tablet, Take 20 mg by mouth daily as needed (indigestion)., Disp: , Rfl:    ondansetron (ZOFRAN-ODT) 4 MG disintegrating tablet, Take 1 tablet (4 mg total) by mouth every 8 (eight) hours as needed for nausea or vomiting., Disp: 20 tablet, Rfl: 0   polyethylene glycol (MIRALAX / GLYCOLAX) 17 g packet, Take 17 g by mouth daily., Disp: , Rfl:    losartan (COZAAR) 100 MG tablet, Take 1 tablet (100 mg total) by mouth at bedtime. (Patient not taking: Reported on 03/26/2022), Disp: 30 tablet, Rfl: 2   nitroGLYCERIN (NITROSTAT) 0.4 MG SL tablet, Place 1 tablet (0.4 mg total) under the tongue every 5 (five) minutes as needed for chest pain. (Patient not taking: Reported on 04/06/2022), Disp: 90 tablet, Rfl: 3   olopatadine (PATANOL) 0.1 % ophthalmic solution, Place 1 drop into both eyes 2 (two) times daily as needed for allergies. (Patient not taking: Reported on  03/26/2022), Disp: , Rfl:   Laboratory examination:   Lab Results  Component Value Date   NA 137 04/19/2022   K 3.7 04/19/2022   CO2 31 04/19/2022   GLUCOSE 141 (H) 04/19/2022   BUN 17 04/19/2022   CREATININE 4.65 (H) 04/19/2022   CALCIUM 8.8 (L) 04/19/2022   GFRNONAA 10 (L) 04/19/2022       Latest Ref Rng & Units 04/19/2022    3:44 PM 03/02/2022    4:31 PM 03/02/2022   10:32 AM  CMP  Glucose 70 - 99 mg/dL 141  76  BUN 8 - 23 mg/dL 17  24    Creatinine 0.44 - 1.00 mg/dL 4.65  4.14    Sodium 135 - 145 mmol/L 137  138    Potassium 3.5 - 5.1 mmol/L 3.7  4.3  >7.5   Chloride 98 - 111 mmol/L 92  99    CO2 22 - 32 mmol/L 31  30    Calcium 8.9 - 10.3 mg/dL 8.8  8.8    Total Protein 6.5 - 8.1 g/dL 7.3     Total Bilirubin 0.3 - 1.2 mg/dL 0.4     Alkaline Phos 38 - 126 U/L 51     AST 15 - 41 U/L 16     ALT 0 - 44 U/L 6         Latest Ref Rng & Units 04/19/2022    3:44 PM 03/02/2022    8:21 AM 03/02/2022    8:03 AM  CBC  WBC 4.0 - 10.5 K/uL 8.5     Hemoglobin 12.0 - 15.0 g/dL 11.4  10.2  11.2   Hematocrit 36.0 - 46.0 % 34.8  30.0  33.0   Platelets 150 - 400 K/uL 189       Lipid Panel No results for input(s): "CHOL", "TRIG", "LDLCALC", "VLDL", "HDL", "CHOLHDL", "LDLDIRECT" in the last 8760 hours.  HEMOGLOBIN A1C Lab Results  Component Value Date   HGBA1C 4.5 (L) 09/21/2021   MPG 82.45 09/21/2021   TSH No results for input(s): "TSH" in the last 8760 hours.  External labs:     Radiology:    Cardiac Studies:   Echocardiogram 02/16/2022: Normal LV systolic function with visual EF 60-65%. Left ventricle cavity is normal in size. Mild concentric hypertrophy of the left ventricle. Normal global wall motion. Doppler evidence of grade II (pseudonormal) diastolic dysfunction, elevated LAP. Calculated EF 71%. Left atrial cavity is moderately dilated at 44.1 ml/m^2. Trileaflet aortic valve with no regurgitation. Mild aortic valve leaflet calcification. Structurally  normal tricuspid valve.  Mild tricuspid regurgitation. No evidence of pulmonary hypertension. RVSP measures 33 mmHg. IVC is dilated with respiratory variation. no prior available for comparison.    Lexiscan (with Mod Bruce protocol) Nuclear stress test 02/10/22 Myocardial perfusion is normal. Significant gut uptake present. Overall LV systolic function is normal without regional wall motion abnormalities. Stress LV EF: visually is 50%. Low risk study. Nondiagnostic ECG stress. The heart rate response was consistent with Regadenoson. The blood pressure response was physiologic. No previous exam available for comparison.    EKG:   01/09/2022 EKG normal sinus rhythm with LVH  Assessment     ICD-10-CM   1. ESRD (end stage renal disease) on dialysis ([place])  N18.6    Z99.2     2. Mixed hyperlipidemia  E78.2     3. Essential hypertension  I10        No orders of the defined types were placed in this encounter.   Meds ordered this encounter  Medications   carvedilol (COREG) 12.5 MG tablet    Sig: Take 1 tablet (12.5 mg total) by mouth 2 (two) times daily.    Dispense:  180 tablet    Refill:  3    Medications Discontinued During This Encounter  Medication Reason   acetaminophen (TYLENOL) 580 MG tablet Duplicate   psyllium (METAMUCIL) 28 % packet Change in therapy   escitalopram (LEXAPRO) 10 MG tablet Completed Course   carvedilol (COREG) 25 MG tablet       Recommendations:   Rachael Burke is a 66 y.o. female with hypertension and end-stage renal disease  ESRD (end stage renal disease) on dialysis ([place]) Following with nephrology regularly   Mixed hyperlipidemia Continue statin   Essential hypertension Will decrease coreg to 12.5 mg BID Continue remaining BP meds as prescribed Encourage low-sodium diet, less than 2000 mg daily. Discussed echo and stress test results, within normal limits    Follow-up in 6 months or sooner if needed.      Floydene Flock, DO  04/29/2022, 2:53 PM Office: [phone] Pager: [phone]

## 2022-04-30 DIAGNOSIS — Z992 Dependence on renal dialysis: Secondary | ICD-10-CM | POA: Diagnosis not present

## 2022-04-30 DIAGNOSIS — N186 End stage renal disease: Secondary | ICD-10-CM | POA: Diagnosis not present

## 2022-04-30 DIAGNOSIS — E1129 Type 2 diabetes mellitus with other diabetic kidney complication: Secondary | ICD-10-CM | POA: Diagnosis not present

## 2022-05-01 DIAGNOSIS — N2581 Secondary hyperparathyroidism of renal origin: Secondary | ICD-10-CM | POA: Diagnosis not present

## 2022-05-01 DIAGNOSIS — N186 End stage renal disease: Secondary | ICD-10-CM | POA: Diagnosis not present

## 2022-05-01 DIAGNOSIS — Z992 Dependence on renal dialysis: Secondary | ICD-10-CM | POA: Diagnosis not present

## 2022-05-01 DIAGNOSIS — D631 Anemia in chronic kidney disease: Secondary | ICD-10-CM | POA: Diagnosis not present

## 2022-05-04 DIAGNOSIS — N186 End stage renal disease: Secondary | ICD-10-CM | POA: Diagnosis not present

## 2022-05-04 DIAGNOSIS — M6281 Muscle weakness (generalized): Secondary | ICD-10-CM | POA: Diagnosis not present

## 2022-05-04 DIAGNOSIS — D631 Anemia in chronic kidney disease: Secondary | ICD-10-CM | POA: Diagnosis not present

## 2022-05-04 DIAGNOSIS — M542 Cervicalgia: Secondary | ICD-10-CM | POA: Diagnosis not present

## 2022-05-04 DIAGNOSIS — N2581 Secondary hyperparathyroidism of renal origin: Secondary | ICD-10-CM | POA: Diagnosis not present

## 2022-05-04 DIAGNOSIS — Z992 Dependence on renal dialysis: Secondary | ICD-10-CM | POA: Diagnosis not present

## 2022-05-31 DIAGNOSIS — Z992 Dependence on renal dialysis: Secondary | ICD-10-CM | POA: Diagnosis not present

## 2022-05-31 DIAGNOSIS — N186 End stage renal disease: Secondary | ICD-10-CM | POA: Diagnosis not present

## 2022-05-31 DIAGNOSIS — D631 Anemia in chronic kidney disease: Secondary | ICD-10-CM | POA: Diagnosis not present

## 2022-05-31 DIAGNOSIS — N2581 Secondary hyperparathyroidism of renal origin: Secondary | ICD-10-CM | POA: Diagnosis not present

## 2022-06-03 DIAGNOSIS — N186 End stage renal disease: Secondary | ICD-10-CM | POA: Diagnosis not present

## 2022-06-03 DIAGNOSIS — N2581 Secondary hyperparathyroidism of renal origin: Secondary | ICD-10-CM | POA: Diagnosis not present

## 2022-06-03 DIAGNOSIS — D509 Iron deficiency anemia, unspecified: Secondary | ICD-10-CM | POA: Diagnosis not present

## 2022-06-03 DIAGNOSIS — Z9889 Other specified postprocedural states: Secondary | ICD-10-CM | POA: Diagnosis not present

## 2022-06-03 DIAGNOSIS — D631 Anemia in chronic kidney disease: Secondary | ICD-10-CM | POA: Diagnosis not present

## 2022-06-03 DIAGNOSIS — Z992 Dependence on renal dialysis: Secondary | ICD-10-CM | POA: Diagnosis not present

## 2022-06-03 DIAGNOSIS — E1122 Type 2 diabetes mellitus with diabetic chronic kidney disease: Secondary | ICD-10-CM | POA: Diagnosis not present

## 2022-06-03 DIAGNOSIS — R110 Nausea: Secondary | ICD-10-CM | POA: Diagnosis not present

## 2022-06-05 ENCOUNTER — Ambulatory Visit (INDEPENDENT_AMBULATORY_CARE_PROVIDER_SITE_OTHER): Payer: Medicare Other | Admitting: Nurse Practitioner

## 2022-06-05 ENCOUNTER — Encounter: Payer: Self-pay | Admitting: Nurse Practitioner

## 2022-06-05 VITALS — BP 127/48 | HR 68 | Temp 98.4°F | Wt 146.4 lb

## 2022-06-05 DIAGNOSIS — E1122 Type 2 diabetes mellitus with diabetic chronic kidney disease: Secondary | ICD-10-CM | POA: Diagnosis not present

## 2022-06-05 DIAGNOSIS — R2681 Unsteadiness on feet: Secondary | ICD-10-CM | POA: Insufficient documentation

## 2022-06-05 DIAGNOSIS — R110 Nausea: Secondary | ICD-10-CM | POA: Diagnosis not present

## 2022-06-05 DIAGNOSIS — R42 Dizziness and giddiness: Secondary | ICD-10-CM | POA: Diagnosis not present

## 2022-06-05 DIAGNOSIS — G8929 Other chronic pain: Secondary | ICD-10-CM | POA: Diagnosis not present

## 2022-06-05 DIAGNOSIS — D631 Anemia in chronic kidney disease: Secondary | ICD-10-CM | POA: Diagnosis not present

## 2022-06-05 DIAGNOSIS — Z992 Dependence on renal dialysis: Secondary | ICD-10-CM | POA: Diagnosis not present

## 2022-06-05 DIAGNOSIS — R519 Headache, unspecified: Secondary | ICD-10-CM | POA: Diagnosis not present

## 2022-06-05 DIAGNOSIS — D509 Iron deficiency anemia, unspecified: Secondary | ICD-10-CM | POA: Diagnosis not present

## 2022-06-05 DIAGNOSIS — N2581 Secondary hyperparathyroidism of renal origin: Secondary | ICD-10-CM | POA: Diagnosis not present

## 2022-06-05 DIAGNOSIS — N186 End stage renal disease: Secondary | ICD-10-CM | POA: Diagnosis not present

## 2022-06-05 NOTE — Patient Instructions (Signed)
1. Dizziness  - For home use only DME 4 wheeled rolling walker with seat ([id number]) - MR Brain Wo Contrast; Future  2. Gait instability  - For home use only DME 4 wheeled rolling walker with seat ([id number])     It is important that you exercise regularly at least 30 minutes 5 times a week as tolerated  Think about what you will eat, plan ahead. Choose " clean, green, fresh or frozen" over canned, processed or packaged foods which are more sugary, salty and fatty. 70 to 75% of food eaten should be vegetables and fruit. Three meals at set times with snacks allowed between meals, but they must be fruit or vegetables. Aim to eat over a 12 hour period , example 7 am to 7 pm, and STOP after  your last meal of the day. Drink water,generally about 64 ounces per day, no other drink is as healthy. Fruit juice is best enjoyed in a healthy way, by EATING the fruit.  Thanks for choosing [name] we consider it a privelige to serve you.

## 2022-06-05 NOTE — Progress Notes (Signed)
Established Patient Office Visit  Subjective:  Patient ID: Rachael Burke, female    DOB: December 06, 1955  Age: 68 y.o. MRN: [id number]  CC:  Chief Complaint  Patient presents with   Consult    Pt is requesting a MRI of the head due to dizziness     HPI Rachael Burke is a 67 y.o. female with past medical history of  ESRD, normal intractable episodic headache, neck pain, dizziness p presents with complaints of, chronic dizziness.  Patient is currently undergoing physical therapy at home PT was ordered by neurology.  She has been evaluated by neurology for her chronic headache, dizziness.  Patient's daughter stated that the patient has seen ENT in the past and was told that she had no vertigo.  Previous CT of the head was unremarkable   Neurology thinks that her chronic headache and dizziness might be  directly related to her dialysis  Neurology plans on doing conservative management as of her last visit with them.  She has an upcoming appointment with neurology at the end of this month.  Patient was encouraged to keep this upcoming appointment.  A walker was ordered today to help prevent falls     Past Medical History:  Diagnosis Date   Anemia    Anxiety    Arthritis    Diabetes mellitus without complication (HCC)    type 2   Heart murmur    echo 07/02/20: Mild MR/TR, mild-moderate AV sclerosis, bicuspid or functional bicuspid AV, no evidence of AS. Murmr felt due to AV.   History of blood transfusion    Hyperlipidemia    Hypertension    Pneumonia    PONV (postoperative nausea and vomiting)    Renal disorder    M-W-F    Past Surgical History:  Procedure Laterality Date   AV FISTULA PLACEMENT Left 05/20/2020   Procedure: INSERTION OF LEFT ARM ARTERIOVENOUS (AV) FISTULA;  Surgeon: Marty Heck, MD;  Location: [place];  Service: Vascular;  Laterality: Left;   [place] Left 08/05/2020   Procedure: SECOND STAGE BASILIC VEIN TRANSPOSITION;   Surgeon: Marty Heck, MD;  Location: [place];  Service: Vascular;  Laterality: Left;   BIOPSY  09/22/2021   Procedure: BIOPSY;  Surgeon: Ladene Artist, MD;  Location: [facility] ENDOSCOPY;  Service: Gastroenterology;;   CESAREAN SECTION     CHOLECYSTECTOMY     ESOPHAGOGASTRODUODENOSCOPY (EGD) WITH PROPOFOL N/A 09/22/2021   Procedure: ESOPHAGOGASTRODUODENOSCOPY (EGD) WITH PROPOFOL;  Surgeon: Ladene Artist, MD;  Location: [facility] ENDOSCOPY;  Service: Gastroenterology;  Laterality: N/A;   INSERTION OF DIALYSIS CATHETER N/A 05/20/2020   Procedure: INSERTION OF DIALYSIS CATHETER;  Surgeon: Marty Heck, MD;  Location: [place];  Service: Vascular;  Laterality: N/A;   VIDEO BRONCHOSCOPY Bilateral 09/26/2021   Procedure: VIDEO BRONCHOSCOPY WITHOUT FLUORO;  Surgeon: Candee Furbish, MD;  Location: [facility] ENDOSCOPY;  Service: Pulmonary;  Laterality: Bilateral;    Family History  Problem Relation Age of Onset   Diabetes Mother    Hypertension Father    Diabetes Sister    Diabetes Brother    Diabetes Brother     Social History   Socioeconomic History   Marital status: Married    Spouse name: Not on file   Number of children: Not on file   Years of education: Not on file   Highest education level: Not on file  Occupational History   Not on file  Tobacco Use   Smoking status:  Never   Smokeless tobacco: Never  Vaping Use   Vaping Use: Never used  Substance and Sexual Activity   Alcohol use: Never   Drug use: Never   Sexual activity: Not on file  Other Topics Concern   Not on file  Social History Narrative   Right handed   Caffeine 1/2 cup daily   One story home   Social Determinants of Health   Financial Resource Strain: Not on file  Food Insecurity: Not on file  Transportation Needs: Not on file  Physical Activity: Not on file  Stress: Not on file  Social Connections: Not on file  Intimate Partner Violence: Not on file    Outpatient Medications Prior to Visit  Medication  Sig Dispense Refill   acetaminophen (TYLENOL) 325 MG tablet Take 2 tablets (650 mg total) by mouth every 6 (six) hours as needed for mild pain or moderate pain. 30 tablet 0   amLODipine (NORVASC) 10 MG tablet Take 1 tablet by mouth daily.     calcium acetate (PHOSLO) 667 MG capsule Take 667 mg by mouth 3 (three) times daily.     carvedilol (COREG) 12.5 MG tablet Take 1 tablet (12.5 mg total) by mouth 2 (two) times daily. 180 tablet 3   gabapentin (NEURONTIN) 100 MG capsule Take 300 mg by mouth at bedtime.     isosorbide-hydrALAZINE (BIDIL) 20-37.5 MG tablet Take 1 tablet by mouth 3 (three) times daily. 90 tablet 2   omeprazole (PRILOSEC OTC) 20 MG tablet Take 20 mg by mouth daily as needed (indigestion).     ondansetron (ZOFRAN-ODT) 4 MG disintegrating tablet Take 1 tablet (4 mg total) by mouth every 8 (eight) hours as needed for nausea or vomiting. 20 tablet 0   polyethylene glycol (MIRALAX / GLYCOLAX) 17 g packet Take 17 g by mouth daily.     losartan (COZAAR) 100 MG tablet Take 1 tablet (100 mg total) by mouth at bedtime. (Patient not taking: Reported on 03/26/2022) 30 tablet 2   nitroGLYCERIN (NITROSTAT) 0.4 MG SL tablet Place 1 tablet (0.4 mg total) under the tongue every 5 (five) minutes as needed for chest pain. (Patient not taking: Reported on 04/06/2022) 90 tablet 3   olopatadine (PATANOL) 0.1 % ophthalmic solution Place 1 drop into both eyes 2 (two) times daily as needed for allergies. (Patient not taking: Reported on 03/26/2022)     No facility-administered medications prior to visit.    No Known Allergies  ROS Review of Systems  Constitutional: Negative.  Negative for activity change, appetite change and chills.  Respiratory: Negative.  Negative for apnea, chest tightness, shortness of breath and wheezing.   Cardiovascular: Negative.  Negative for chest pain, palpitations and leg swelling.  Neurological:  Positive for dizziness and headaches. Negative for tremors, facial asymmetry,  speech difficulty and weakness.  Psychiatric/Behavioral: Negative.  Negative for agitation, behavioral problems and confusion.       Objective:    Physical Exam Eyes:     General: No scleral icterus.       Right eye: No discharge.        Left eye: No discharge.  Cardiovascular:     Rate and Rhythm: Normal rate.     Heart sounds: Murmur heard.  Pulmonary:     Effort: No respiratory distress.     Breath sounds: No stridor. No wheezing, rhonchi or rales.  Chest:     Chest wall: No tenderness.  Abdominal:     General: There is no distension.  Palpations: Abdomen is soft.     Tenderness: There is no abdominal tenderness.  Musculoskeletal:        General: No swelling, tenderness, deformity or signs of injury.     Right lower leg: No edema.     Left lower leg: No edema.  Skin:    General: Skin is warm and dry.     Capillary Refill: Capillary refill takes less than 2 seconds.  Neurological:     Mental Status: She is alert and oriented to person, place, and time.     Cranial Nerves: No cranial nerve deficit.     Coordination: Coordination normal.     Gait: Gait abnormal.     Comments: Power 5/5 BUE and BLE  ambulating with a slow but steady gait today, she was supported by her daughter while ambulating. today    Psychiatric:        Mood and Affect: Mood normal.        Behavior: Behavior normal.        Thought Content: Thought content normal.        Judgment: Judgment normal.     BP (!) 127/48   Pulse 68   Temp 98.4 F (36.9 C)   Wt 146 lb 6.4 oz (66.4 kg)   SpO2 98%   BMI 28.59 kg/m  Wt Readings from Last 3 Encounters:  06/05/22 146 lb 6.4 oz (66.4 kg)  04/29/22 148 lb 9.6 oz (67.4 kg)  04/06/22 151 lb (68.5 kg)    No results found for: "TSH" Lab Results  Component Value Date   WBC 8.5 04/19/2022   HGB 11.4 (L) 04/19/2022   HCT 34.8 (L) 04/19/2022   MCV 92.6 04/19/2022   PLT 189 04/19/2022   Lab Results  Component Value Date   NA 137 04/19/2022   K  3.7 04/19/2022   CO2 31 04/19/2022   GLUCOSE 141 (H) 04/19/2022   BUN 17 04/19/2022   CREATININE 4.65 (H) 04/19/2022   BILITOT 0.4 04/19/2022   ALKPHOS 51 04/19/2022   AST 16 04/19/2022   ALT 6 04/19/2022   PROT 7.3 04/19/2022   ALBUMIN 3.7 04/19/2022   CALCIUM 8.8 (L) 04/19/2022   ANIONGAP 14 04/19/2022   Lab Results  Component Value Date   CHOL 252 (H) 04/06/2019   Lab Results  Component Value Date   HDL 53 04/06/2019   Lab Results  Component Value Date   LDLCALC 152 (H) 04/06/2019   Lab Results  Component Value Date   TRIG 257 (H) 04/06/2019   Lab Results  Component Value Date   CHOLHDL 4.8 (H) 04/06/2019   Lab Results  Component Value Date   HGBA1C 4.5 (L) 09/21/2021      Assessment & Plan:   Problem List Items Addressed This Visit       Other   Nonintractable headache    Chronic condition, she is followed by neurology.  Has upcoming appointment at the end Patient was encouraged to keep this upcoming appointment      Dizziness - Primary    Chronic condition She is currently established with neurology I did encourage the patient to follow-up with neurology avoid changing position abruptly and use a walker for support during ambulation to help reduce the risk of fall.      Relevant Orders   For home use only DME 4 wheeled rolling walker with seat ([id number])   Gait instability    Rachael Burke was ordered today patient encouraged to use the walker  to assist with ambulation and to prevent fall.  Need to prevent falls was discussed.  Patient encouraged to continue physical therapy and follow-up with neurology      Relevant Orders   For home use only DME 4 wheeled rolling walker with seat ([id number])    No orders of the defined types were placed in this encounter.   Follow-up: No follow-ups on file.    Renee Rival, FNP

## 2022-06-05 NOTE — Assessment & Plan Note (Signed)
Chronic condition, she is followed by neurology.  Has upcoming appointment at the end Patient was encouraged to keep this upcoming appointment

## 2022-06-05 NOTE — Assessment & Plan Note (Signed)
Rachael Burke was ordered today patient encouraged to use the walker to assist with ambulation and to prevent fall.  Need to prevent falls was discussed.  Patient encouraged to continue physical therapy and follow-up with neurology

## 2022-06-05 NOTE — Assessment & Plan Note (Signed)
Chronic condition She is currently established with neurology I did encourage the patient to follow-up with neurology avoid changing position abruptly and use a walker for support during ambulation to help reduce the risk of fall.

## 2022-06-08 DIAGNOSIS — R110 Nausea: Secondary | ICD-10-CM | POA: Diagnosis not present

## 2022-06-08 DIAGNOSIS — N2581 Secondary hyperparathyroidism of renal origin: Secondary | ICD-10-CM | POA: Diagnosis not present

## 2022-06-08 DIAGNOSIS — E1122 Type 2 diabetes mellitus with diabetic chronic kidney disease: Secondary | ICD-10-CM | POA: Diagnosis not present

## 2022-06-08 DIAGNOSIS — N186 End stage renal disease: Secondary | ICD-10-CM | POA: Diagnosis not present

## 2022-06-08 DIAGNOSIS — Z992 Dependence on renal dialysis: Secondary | ICD-10-CM | POA: Diagnosis not present

## 2022-06-08 DIAGNOSIS — D631 Anemia in chronic kidney disease: Secondary | ICD-10-CM | POA: Diagnosis not present

## 2022-06-08 DIAGNOSIS — D509 Iron deficiency anemia, unspecified: Secondary | ICD-10-CM | POA: Diagnosis not present

## 2022-06-10 DIAGNOSIS — D509 Iron deficiency anemia, unspecified: Secondary | ICD-10-CM | POA: Diagnosis not present

## 2022-06-10 DIAGNOSIS — Z992 Dependence on renal dialysis: Secondary | ICD-10-CM | POA: Diagnosis not present

## 2022-06-10 DIAGNOSIS — E1122 Type 2 diabetes mellitus with diabetic chronic kidney disease: Secondary | ICD-10-CM | POA: Diagnosis not present

## 2022-06-10 DIAGNOSIS — R110 Nausea: Secondary | ICD-10-CM | POA: Diagnosis not present

## 2022-06-10 DIAGNOSIS — N186 End stage renal disease: Secondary | ICD-10-CM | POA: Diagnosis not present

## 2022-06-10 DIAGNOSIS — D631 Anemia in chronic kidney disease: Secondary | ICD-10-CM | POA: Diagnosis not present

## 2022-06-10 DIAGNOSIS — N2581 Secondary hyperparathyroidism of renal origin: Secondary | ICD-10-CM | POA: Diagnosis not present

## 2022-06-11 DIAGNOSIS — M542 Cervicalgia: Secondary | ICD-10-CM | POA: Diagnosis not present

## 2022-06-11 DIAGNOSIS — M6281 Muscle weakness (generalized): Secondary | ICD-10-CM | POA: Diagnosis not present

## 2022-06-12 DIAGNOSIS — R110 Nausea: Secondary | ICD-10-CM | POA: Diagnosis not present

## 2022-06-12 DIAGNOSIS — D509 Iron deficiency anemia, unspecified: Secondary | ICD-10-CM | POA: Diagnosis not present

## 2022-06-12 DIAGNOSIS — N186 End stage renal disease: Secondary | ICD-10-CM | POA: Diagnosis not present

## 2022-06-12 DIAGNOSIS — D631 Anemia in chronic kidney disease: Secondary | ICD-10-CM | POA: Diagnosis not present

## 2022-06-12 DIAGNOSIS — N2581 Secondary hyperparathyroidism of renal origin: Secondary | ICD-10-CM | POA: Diagnosis not present

## 2022-06-12 DIAGNOSIS — Z992 Dependence on renal dialysis: Secondary | ICD-10-CM | POA: Diagnosis not present

## 2022-06-12 DIAGNOSIS — E1122 Type 2 diabetes mellitus with diabetic chronic kidney disease: Secondary | ICD-10-CM | POA: Diagnosis not present

## 2022-06-15 DIAGNOSIS — E1122 Type 2 diabetes mellitus with diabetic chronic kidney disease: Secondary | ICD-10-CM | POA: Diagnosis not present

## 2022-06-15 DIAGNOSIS — N2581 Secondary hyperparathyroidism of renal origin: Secondary | ICD-10-CM | POA: Diagnosis not present

## 2022-06-15 DIAGNOSIS — R110 Nausea: Secondary | ICD-10-CM | POA: Diagnosis not present

## 2022-06-15 DIAGNOSIS — N186 End stage renal disease: Secondary | ICD-10-CM | POA: Diagnosis not present

## 2022-06-15 DIAGNOSIS — D509 Iron deficiency anemia, unspecified: Secondary | ICD-10-CM | POA: Diagnosis not present

## 2022-06-15 DIAGNOSIS — D631 Anemia in chronic kidney disease: Secondary | ICD-10-CM | POA: Diagnosis not present

## 2022-06-15 DIAGNOSIS — Z992 Dependence on renal dialysis: Secondary | ICD-10-CM | POA: Diagnosis not present

## 2022-06-16 DIAGNOSIS — M6281 Muscle weakness (generalized): Secondary | ICD-10-CM | POA: Diagnosis not present

## 2022-06-16 DIAGNOSIS — M542 Cervicalgia: Secondary | ICD-10-CM | POA: Diagnosis not present

## 2022-06-17 DIAGNOSIS — D631 Anemia in chronic kidney disease: Secondary | ICD-10-CM | POA: Diagnosis not present

## 2022-06-17 DIAGNOSIS — R110 Nausea: Secondary | ICD-10-CM | POA: Diagnosis not present

## 2022-06-17 DIAGNOSIS — N2581 Secondary hyperparathyroidism of renal origin: Secondary | ICD-10-CM | POA: Diagnosis not present

## 2022-06-17 DIAGNOSIS — E113512 Type 2 diabetes mellitus with proliferative diabetic retinopathy with macular edema, left eye: Secondary | ICD-10-CM | POA: Diagnosis not present

## 2022-06-17 DIAGNOSIS — N186 End stage renal disease: Secondary | ICD-10-CM | POA: Diagnosis not present

## 2022-06-17 DIAGNOSIS — E113591 Type 2 diabetes mellitus with proliferative diabetic retinopathy without macular edema, right eye: Secondary | ICD-10-CM | POA: Diagnosis not present

## 2022-06-17 DIAGNOSIS — D509 Iron deficiency anemia, unspecified: Secondary | ICD-10-CM | POA: Diagnosis not present

## 2022-06-17 DIAGNOSIS — Z992 Dependence on renal dialysis: Secondary | ICD-10-CM | POA: Diagnosis not present

## 2022-06-17 DIAGNOSIS — E1122 Type 2 diabetes mellitus with diabetic chronic kidney disease: Secondary | ICD-10-CM | POA: Diagnosis not present

## 2022-06-17 NOTE — Progress Notes (Signed)
NEUROLOGY FOLLOW UP OFFICE NOTE  Rachael Burke [id number]  Subjective:  Rachael Burke is a 67 y.o. year old  DM, HLD, HTN, ESRD (on dialysis), left sided hearing loss, uterine cancer? who we last saw on 03/26/22.  To briefly review: Patient has had symptoms since 11/2018. Patient got really sick (nausea, vomiting, diarrhea, dizzy, headache, and neck pain) in [place]. The doctors there were talking about doing an exploratory surgery, but patient did not do this. This lasted about 3 months. Her nausea, vomiting, and diarrhea all improved but the dizziness and headache did not improve.   Currently patient currently has severe headaches. It is in the neck and front of her head. It is a pressure sensation. It is about 5/10. Her headaches are associated with neck pain, photophobia, phonophobia, and nausea. Her headache typically lasts hours. If she takes tylenol, the headache can resolve in 1 hour. She takes  tylenol 1000 mg twice per day when she has a headache. She typically has headaches after dialysis (3 times per week). On other days, she does not have significant headaches. Triggers include being around a lot of people and watching TV. Going to lay down in cold dark room can help. She has never been given a medication for headaches.   She has dizziness that she was previously told was vertigo prior but no cause was found. The symptoms come and go. She describes the vertigo as spinning and not feeling like herself. Daughter sees her walking off balance around the house. She has not fallen. She was given Nootropil in [place]. This helps.   Of note, patient was seen at the ED on 03/02/22 after dialysis for AMS. She was found to be bradycardic, with hyperkalemic, and elevated BP (170s/90s). CT head at that time was unremarkable.   Patient takes gabapentin for neuropathy.  Most recent Assessment and Plan (03/26/22): Overall, patient's headaches appear to be directly related to her  dialysis. She recently had to be evaluated at the ED after dialysis and found to have hyperkalemia and elevated BP. When asked about who is managing dialysis and if adjustments had been made, patient and her daughter mentioned they had not talked to anyone. Per daughter, patient does not have a nephrologist, which seems odd given that she has ESRD. Daughter also requested new PCP. I encouraged patient to follow up with PCP (previous or new) and make sure she had a nephrologist, as this would likely help some of her symptoms. Her neck may also be contributing to both headaches and dizziness, which was harder for me to characterize today. I will send to PT and get patient to discuss with PCP and nephro, then see back to assess if more needs to be done. A headache prevention medication may be difficult as patient is already on beta blockers and may have contraindications to others given her dialysis (TCA, ?long QT, arrhthymias). I will manage conservatively now until her care is more coordinated (or she understands her care better).   PLAN: -Physical therapy consult for neck pain -Would like referral to new PCP per daughter -Would like referral for nephrologist (says she does not have one?)  Since their last visit: Patient is with her daughter and Spanish interpreter today.   Patient continues to have headaches and walks like a "drunk person."  Patient went to PT which helped with some neck pain. Her headaches improved when her neck felt better. Some days she has no pain and others a lot  of pain. Her dizziness did not improve. Due to dizziness, there was concern from patient and daughter that she needs an MRI.  Her blood pressure has improved which has also improved her headaches. Her walking has not improved. She continues to walk from side to side. Moving her neck makes her head feel like it is spinning. She wants to close her eyes when this happens.  Patient established with a PCP about 2 weeks ago.  She sees a nephrologist at dialysis.  MEDICATIONS:  Outpatient Encounter Medications as of 06/26/2022  Medication Sig   acetaminophen (TYLENOL) 325 MG tablet Take 2 tablets (650 mg total) by mouth every 6 (six) hours as needed for mild pain or moderate pain.   amLODipine (NORVASC) 10 MG tablet Take 1 tablet by mouth daily.   calcium acetate (PHOSLO) 667 MG capsule Take 667 mg by mouth 3 (three) times daily.   carvedilol (COREG) 12.5 MG tablet Take 1 tablet (12.5 mg total) by mouth 2 (two) times daily.   gabapentin (NEURONTIN) 100 MG capsule Take 300 mg by mouth at bedtime.   isosorbide-hydrALAZINE (BIDIL) 20-37.5 MG tablet Take 1 tablet by mouth 3 (three) times daily.   omeprazole (PRILOSEC OTC) 20 MG tablet Take 20 mg by mouth daily as needed (indigestion).   ondansetron (ZOFRAN-ODT) 4 MG disintegrating tablet Take 1 tablet (4 mg total) by mouth every 8 (eight) hours as needed for nausea or vomiting.   polyethylene glycol (MIRALAX / GLYCOLAX) 17 g packet Take 17 g by mouth daily.   losartan (COZAAR) 100 MG tablet Take 1 tablet (100 mg total) by mouth at bedtime. (Patient not taking: Reported on 06/22/2022)   nitroGLYCERIN (NITROSTAT) 0.4 MG SL tablet Place 1 tablet (0.4 mg total) under the tongue every 5 (five) minutes as needed for chest pain.   olopatadine (PATANOL) 0.1 % ophthalmic solution Place 1 drop into both eyes 2 (two) times daily as needed for allergies. (Patient not taking: Reported on 06/26/2022)   No facility-administered encounter medications on file as of 06/26/2022.    PAST MEDICAL HISTORY: Past Medical History:  Diagnosis Date   Anemia    Anxiety    Arthritis    Diabetes mellitus without complication (HCC)    type 2   Heart murmur    echo 07/02/20: Mild MR/TR, mild-moderate AV sclerosis, bicuspid or functional bicuspid AV, no evidence of AS. Murmr felt due to AV.   History of blood transfusion    Hyperlipidemia    Hypertension    Pneumonia    PONV (postoperative nausea  and vomiting)    Renal disorder    M-W-F    PAST SURGICAL HISTORY: Past Surgical History:  Procedure Laterality Date   AV FISTULA PLACEMENT Left 05/20/2020   Procedure: INSERTION OF LEFT ARM ARTERIOVENOUS (AV) FISTULA;  Surgeon: Marty Heck, MD;  Location: [place];  Service: Vascular;  Laterality: Left;   [place] Left 08/05/2020   Procedure: SECOND STAGE BASILIC VEIN TRANSPOSITION;  Surgeon: Marty Heck, MD;  Location: [place];  Service: Vascular;  Laterality: Left;   BIOPSY  09/22/2021   Procedure: BIOPSY;  Surgeon: Ladene Artist, MD;  Location: [facility] ENDOSCOPY;  Service: Gastroenterology;;   BLADDER REPAIR     nov 2023   CESAREAN SECTION     CHOLECYSTECTOMY     ESOPHAGOGASTRODUODENOSCOPY (EGD) WITH PROPOFOL N/A 09/22/2021   Procedure: ESOPHAGOGASTRODUODENOSCOPY (EGD) WITH PROPOFOL;  Surgeon: Ladene Artist, MD;  Location: [facility];  Service: Gastroenterology;  Laterality: N/A;  INSERTION OF DIALYSIS CATHETER N/A 05/20/2020   Procedure: INSERTION OF DIALYSIS CATHETER;  Surgeon: Marty Heck, MD;  Location: [place];  Service: Vascular;  Laterality: N/A;   VAGINAL HYSTERECTOMY     nov 2023   VIDEO BRONCHOSCOPY Bilateral 09/26/2021   Procedure: VIDEO BRONCHOSCOPY WITHOUT FLUORO;  Surgeon: Candee Furbish, MD;  Location: [facility] ENDOSCOPY;  Service: Pulmonary;  Laterality: Bilateral;    ALLERGIES: No Known Allergies  FAMILY HISTORY: Family History  Problem Relation Age of Onset   Diabetes Mother    Hypertension Father    Diabetes Sister    Diabetes Brother    Diabetes Brother     SOCIAL HISTORY: Social History   Tobacco Use   Smoking status: Never   Smokeless tobacco: Never  Vaping Use   Vaping Use: Never used  Substance Use Topics   Alcohol use: Never   Drug use: Never   Social History   Social History Narrative   Right handed   Caffeine 1/2 cup daily   One story home   Lives with daughter   retired       Objective:  Vital Signs:  BP 123/65   Pulse 73   Ht 5\' 2"  (1.575 m)   Wt 144 lb 9.6 oz (65.6 kg)   SpO2 95%   BMI 26.45 kg/m   General: No acute distress.  Patient appears well-groomed.   Head:  Normocephalic/atraumatic Eyes:  fundi examined but not visualized Neck: supple, paraspinal tenderness, Reduced range of motion Back: No paraspinal tenderness Heart: regular rate and rhythm Lungs: Clear to auscultation bilaterally. Vascular: No carotid bruits.  Neurological Exam: Mental status: alert and oriented, speech fluent and not dysarthric, language intact.  Cranial nerves: CN I: not tested CN II: pupils equal, round and reactive to light, visual fields intact CN III, IV, VI:  full range of motion, no nystagmus, no ptosis CN V: facial sensation intact. CN VII: upper and lower face symmetric CN VIII: hearing intact CN IX, X: gag intact, uvula midline CN XI: sternocleidomastoid and trapezius muscles intact CN XII: tongue midline  Bulk & Tone: normal, no fasciculations. Motor:  muscle strength 5/5 throughout Deep Tendon Reflexes:  2+ throughout  Sensation:  Light touch sensation intact. Finger to nose testing:  Without dysmetria.   Gait:  Normal station, ?ataxic gait.   Labs and Imaging review: New results: 04/19/22: CMP significant for hyperglycemia (141), elevated Cr 4.65 CBC significant for anemia (chronic, Hb 11.4)  Previously reviewed results: Internal labs: Recent Labs[] Expand by Default       Lab Results  Component Value Date    HGBA1C 4.5 (L) 09/21/2021      Recent Labs       Lab Results  Component Value Date    VITAMINB12 1,252 (H) 04/07/2019       Imaging: CT head wo contrast (03/02/22): FINDINGS: Brain: No evidence of acute infarction, hemorrhage, hydrocephalus, extra-axial collection or mass lesion/mass effect. Mild generalized parenchymal volume loss, within normal range for patient age. Scattered hypodensities in the periventricular and  subcortical white matter, consistent with chronic small-vessel ischemic changes.   Vascular: Vascular calcifications at the skull base. No hyperdense vessel or unexpected calcification.   Skull: Normal. Negative for fracture or focal lesion.   Sinuses/Orbits: No acute finding. Trace mucosal thickening in a posterior left ethmoid air cell. The other paranasal sinuses and mastoid air cells are clear.   Other: None.   IMPRESSION: No acute intracranial abnormality.   CT maxillofacial wo contrast (  10/07/21): FINDINGS: Osseous: Negative for fracture, erosion, or malalignment at the temporomandibular joints. No evidence of degeneration of the TMJ. Multiple missing teeth with dental appliance that obscures remaining teeth, many of which have undergone repair.   Orbits: No evidence of inflammation or mass   Sinuses: Essentially clear, only trace mucosal thickening seen along the right maxillary floor   Soft tissues: Negative   Limited intracranial: Negative   IMPRESSION: Normal appearance of the temporomandibular joints.  Assessment/Plan:  This is [name], a 67 y.o. female with headaches, dizziness, and neck pain. As suspected, patient's headache improved with PT for neck and with improved BP. She still has significant headaches though. Her dizziness and imbalance have not improved. The etiology of her dizziness is still unclear. I still believe her neck could be contributing, but has not improved with PT. MRI of brain and cervical spine is reasonable to work this up further. I want to treat her symptoms, however the options are limited given her other medications and being on dialysis. I will try the options below at very low doses, but was careful to explain to patient to monitor for potential side effects.  Plan: -Blood work: TSH, Copper, Vit E -MRI brain and cervical spine wo contrast -Topamax 25 mg daily for headaches -Meclizine 12.5 mg PRN for dizziness - will  stay very low due to ESRD  Return to clinic in 1 month  Total time spent reviewing records, interview, history/exam, documentation, and coordination of care on day of encounter:  40 min  Kai Levins, MD

## 2022-06-19 DIAGNOSIS — N186 End stage renal disease: Secondary | ICD-10-CM | POA: Diagnosis not present

## 2022-06-19 DIAGNOSIS — D631 Anemia in chronic kidney disease: Secondary | ICD-10-CM | POA: Diagnosis not present

## 2022-06-19 DIAGNOSIS — Z992 Dependence on renal dialysis: Secondary | ICD-10-CM | POA: Diagnosis not present

## 2022-06-19 DIAGNOSIS — D509 Iron deficiency anemia, unspecified: Secondary | ICD-10-CM | POA: Diagnosis not present

## 2022-06-19 DIAGNOSIS — E1122 Type 2 diabetes mellitus with diabetic chronic kidney disease: Secondary | ICD-10-CM | POA: Diagnosis not present

## 2022-06-19 DIAGNOSIS — R110 Nausea: Secondary | ICD-10-CM | POA: Diagnosis not present

## 2022-06-19 DIAGNOSIS — N2581 Secondary hyperparathyroidism of renal origin: Secondary | ICD-10-CM | POA: Diagnosis not present

## 2022-06-22 ENCOUNTER — Emergency Department (HOSPITAL_COMMUNITY): Payer: 59

## 2022-06-22 ENCOUNTER — Encounter (HOSPITAL_COMMUNITY): Payer: Self-pay

## 2022-06-22 ENCOUNTER — Other Ambulatory Visit: Payer: Self-pay

## 2022-06-22 ENCOUNTER — Observation Stay (HOSPITAL_COMMUNITY)
Admission: EM | Admit: 2022-06-22 | Discharge: 2022-06-23 | Disposition: A | Discharge status: Home / Self Care | Payer: 59 | Attending: Internal Medicine | Admitting: Internal Medicine

## 2022-06-22 DIAGNOSIS — E1122 Type 2 diabetes mellitus with diabetic chronic kidney disease: Secondary | ICD-10-CM | POA: Diagnosis not present

## 2022-06-22 DIAGNOSIS — R1010 Upper abdominal pain, unspecified: Secondary | ICD-10-CM

## 2022-06-22 DIAGNOSIS — E1140 Type 2 diabetes mellitus with diabetic neuropathy, unspecified: Secondary | ICD-10-CM | POA: Insufficient documentation

## 2022-06-22 DIAGNOSIS — Z79899 Other long term (current) drug therapy: Secondary | ICD-10-CM | POA: Diagnosis not present

## 2022-06-22 DIAGNOSIS — N186 End stage renal disease: Secondary | ICD-10-CM | POA: Insufficient documentation

## 2022-06-22 DIAGNOSIS — R6883 Chills (without fever): Secondary | ICD-10-CM | POA: Insufficient documentation

## 2022-06-22 DIAGNOSIS — R1013 Epigastric pain: Secondary | ICD-10-CM | POA: Diagnosis present

## 2022-06-22 DIAGNOSIS — I120 Hypertensive chronic kidney disease with stage 5 chronic kidney disease or end stage renal disease: Secondary | ICD-10-CM | POA: Diagnosis not present

## 2022-06-22 DIAGNOSIS — I10 Essential (primary) hypertension: Secondary | ICD-10-CM | POA: Diagnosis not present

## 2022-06-22 DIAGNOSIS — R110 Nausea: Secondary | ICD-10-CM | POA: Insufficient documentation

## 2022-06-22 DIAGNOSIS — Z992 Dependence on renal dialysis: Secondary | ICD-10-CM | POA: Diagnosis not present

## 2022-06-22 DIAGNOSIS — D631 Anemia in chronic kidney disease: Secondary | ICD-10-CM | POA: Insufficient documentation

## 2022-06-22 DIAGNOSIS — Z743 Need for continuous supervision: Secondary | ICD-10-CM | POA: Diagnosis not present

## 2022-06-22 DIAGNOSIS — K529 Noninfective gastroenteritis and colitis, unspecified: Principal | ICD-10-CM | POA: Insufficient documentation

## 2022-06-22 DIAGNOSIS — Z1152 Encounter for screening for COVID-19: Secondary | ICD-10-CM | POA: Insufficient documentation

## 2022-06-22 DIAGNOSIS — R079 Chest pain, unspecified: Secondary | ICD-10-CM | POA: Diagnosis not present

## 2022-06-22 DIAGNOSIS — I700 Atherosclerosis of aorta: Secondary | ICD-10-CM | POA: Diagnosis not present

## 2022-06-22 DIAGNOSIS — R112 Nausea with vomiting, unspecified: Secondary | ICD-10-CM | POA: Diagnosis not present

## 2022-06-22 DIAGNOSIS — Z86001 Personal history of in-situ neoplasm of cervix uteri: Secondary | ICD-10-CM | POA: Diagnosis not present

## 2022-06-22 DIAGNOSIS — R109 Unspecified abdominal pain: Secondary | ICD-10-CM | POA: Diagnosis not present

## 2022-06-22 DIAGNOSIS — R197 Diarrhea, unspecified: Secondary | ICD-10-CM | POA: Diagnosis not present

## 2022-06-22 DIAGNOSIS — E875 Hyperkalemia: Secondary | ICD-10-CM | POA: Diagnosis not present

## 2022-06-22 DIAGNOSIS — R1012 Left upper quadrant pain: Secondary | ICD-10-CM | POA: Diagnosis not present

## 2022-06-22 LAB — CBC
HCT: 35.6 % — ABNORMAL LOW (ref 36.0–46.0)
Hemoglobin: 11.2 g/dL — ABNORMAL LOW (ref 12.0–15.0)
MCH: 29.9 pg (ref 26.0–34.0)
MCHC: 31.5 g/dL (ref 30.0–36.0)
MCV: 94.9 fL (ref 80.0–100.0)
Platelets: 155 10*3/uL (ref 150–400)
RBC: 3.75 MIL/uL — ABNORMAL LOW (ref 3.87–5.11)
RDW: 15.8 % — ABNORMAL HIGH (ref 11.5–15.5)
WBC: 13.2 10*3/uL — ABNORMAL HIGH (ref 4.0–10.5)
nRBC: 0 % (ref 0.0–0.2)

## 2022-06-22 LAB — HEPATIC FUNCTION PANEL
ALT: 10 U/L (ref 0–44)
AST: 18 U/L (ref 15–41)
Albumin: 3.7 g/dL (ref 3.5–5.0)
Alkaline Phosphatase: 82 U/L (ref 38–126)
Bilirubin, Direct: 0.2 mg/dL (ref 0.0–0.2)
Indirect Bilirubin: 0 mg/dL — ABNORMAL LOW (ref 0.3–0.9)
Total Bilirubin: 0.2 mg/dL — ABNORMAL LOW (ref 0.3–1.2)
Total Protein: 6.6 g/dL (ref 6.5–8.1)

## 2022-06-22 LAB — LIPASE, BLOOD
Lipase: 299 U/L — ABNORMAL HIGH (ref 11–51)
Lipase: 214 U/L — ABNORMAL HIGH (ref 11–51)

## 2022-06-22 LAB — BASIC METABOLIC PANEL
Anion gap: 16 — ABNORMAL HIGH (ref 5–15)
BUN: 79 mg/dL — ABNORMAL HIGH (ref 8–23)
CO2: 24 mmol/L (ref 22–32)
Calcium: 7.6 mg/dL — ABNORMAL LOW (ref 8.9–10.3)
Chloride: 97 mmol/L — ABNORMAL LOW (ref 98–111)
Creatinine, Ser: 9.42 mg/dL — ABNORMAL HIGH (ref 0.44–1.00)
GFR, Estimated: 4 mL/min — ABNORMAL LOW (ref >60)
Glucose, Bld: 181 mg/dL — ABNORMAL HIGH (ref 70–99)
Potassium: 6.9 mmol/L (ref 3.5–5.1)
Sodium: 137 mmol/L (ref 135–145)

## 2022-06-22 LAB — RESP PANEL BY RT-PCR (RSV, FLU A&B, COVID)  RVPGX2
Influenza A by PCR: NEGATIVE
Influenza B by PCR: NEGATIVE
Resp Syncytial Virus by PCR: NEGATIVE
SARS Coronavirus 2 by RT PCR: NEGATIVE

## 2022-06-22 LAB — POTASSIUM: Potassium: 3.8 mmol/L (ref 3.5–5.1)

## 2022-06-22 LAB — HEPATITIS B SURFACE ANTIGEN: Hepatitis B Surface Ag: NONREACTIVE

## 2022-06-22 MED ORDER — SODIUM CHLORIDE 0.9 % IV BOLUS
500.0000 mL | Freq: Once | INTRAVENOUS | Status: AC
  Administered 2022-06-22: 500 mL via INTRAVENOUS

## 2022-06-22 MED ORDER — HEPARIN SODIUM (PORCINE) 5000 UNIT/ML IJ SOLN
5000.0000 [IU] | Freq: Three times a day (TID) | INTRAMUSCULAR | Status: DC
  Administered 2022-06-22 – 2022-06-23 (×2): 5000 [IU] via SUBCUTANEOUS
  Filled 2022-06-22 (×2): qty 1

## 2022-06-22 MED ORDER — ISOSORB DINITRATE-HYDRALAZINE 20-37.5 MG PO TABS
1.0000 | ORAL_TABLET | Freq: Three times a day (TID) | ORAL | Status: DC
  Administered 2022-06-22 – 2022-06-23 (×2): 1 via ORAL
  Filled 2022-06-22 (×2): qty 1

## 2022-06-22 MED ORDER — AMLODIPINE BESYLATE 10 MG PO TABS
10.0000 mg | ORAL_TABLET | Freq: Every day | ORAL | Status: DC
  Administered 2022-06-23: 10 mg via ORAL
  Filled 2022-06-22: qty 1

## 2022-06-22 MED ORDER — ONDANSETRON HCL 4 MG/2ML IJ SOLN
4.0000 mg | Freq: Once | INTRAMUSCULAR | Status: AC
  Administered 2022-06-22: 4 mg via INTRAVENOUS
  Filled 2022-06-22: qty 2

## 2022-06-22 MED ORDER — DEXTROSE 50 % IV SOLN
1.0000 | Freq: Once | INTRAVENOUS | Status: AC
  Administered 2022-06-22: 50 mL via INTRAVENOUS
  Filled 2022-06-22: qty 50

## 2022-06-22 MED ORDER — LOPERAMIDE HCL 2 MG PO CAPS: 2.0000 mg | ORAL_CAPSULE | ORAL | Status: DC | PRN

## 2022-06-22 MED ORDER — CHLORHEXIDINE GLUCONATE CLOTH 2 % EX PADS
6.0000 | MEDICATED_PAD | Freq: Every day | CUTANEOUS | Status: DC
  Administered 2022-06-23: 6 via TOPICAL

## 2022-06-22 MED ORDER — CALCIUM GLUCONATE 10 % IV SOLN
1.0000 g | Freq: Once | INTRAVENOUS | Status: AC
  Administered 2022-06-22: 1 g via INTRAVENOUS
  Filled 2022-06-22: qty 10

## 2022-06-22 MED ORDER — ALBUTEROL SULFATE (2.5 MG/3ML) 0.083% IN NEBU
10.0000 mg | INHALATION_SOLUTION | Freq: Once | RESPIRATORY_TRACT | Status: DC
  Filled 2022-06-22: qty 12

## 2022-06-22 MED ORDER — INSULIN ASPART 100 UNIT/ML IV SOLN
5.0000 [IU] | Freq: Once | INTRAVENOUS | Status: AC
  Administered 2022-06-22: 5 [IU] via INTRAVENOUS

## 2022-06-22 MED ORDER — SODIUM BICARBONATE 8.4 % IV SOLN: 50.0000 meq | Freq: Once | INTRAVENOUS | Status: DC

## 2022-06-22 MED ORDER — GABAPENTIN 300 MG PO CAPS
300.0000 mg | ORAL_CAPSULE | Freq: Every day | ORAL | Status: DC
  Administered 2022-06-22: 300 mg via ORAL
  Filled 2022-06-22: qty 1

## 2022-06-22 MED ORDER — ONDANSETRON HCL 4 MG/2ML IJ SOLN: 4.0000 mg | Freq: Three times a day (TID) | INTRAMUSCULAR | Status: DC | PRN

## 2022-06-22 MED ORDER — FENTANYL CITRATE PF 50 MCG/ML IJ SOSY
50.0000 ug | PREFILLED_SYRINGE | Freq: Once | INTRAMUSCULAR | Status: AC
  Administered 2022-06-22: 50 ug via INTRAVENOUS
  Filled 2022-06-22: qty 1

## 2022-06-22 MED ORDER — CARVEDILOL 12.5 MG PO TABS
12.5000 mg | ORAL_TABLET | Freq: Two times a day (BID) | ORAL | Status: DC
  Administered 2022-06-22 – 2022-06-23 (×2): 12.5 mg via ORAL
  Filled 2022-06-22 (×2): qty 1

## 2022-06-22 NOTE — Progress Notes (Signed)
Received patient in bed to unit.  Alert and oriented.  Informed consent signed and in chart.   Treatment initiated: 15:40 Treatment completed: 19:00  Patient tolerated well.  Transported back to the room  Alert, without acute distress.  Hand-off given to patient's nurse.   Access used: left AVF Access issues: none  Total UF removed: 2.3L Medication(s) given: none    06/22/22 1900  Vitals  Temp 98.2 F (36.8 C)  Temp Source Oral  BP (!) 194/76  MAP (mmHg) 104  BP Location Right Wrist  BP Method Automatic  Patient Position (if appropriate) Lying  Pulse Rate 72  Pulse Rate Source Monitor  ECG Heart Rate 71  Resp 12  Oxygen Therapy  SpO2 98 %  O2 Device Room Air  During Treatment Monitoring  HD Safety Checks Performed Yes  Intra-Hemodialysis Comments Tolerated well;Tx completed  Dialysis Fluid Bolus Normal Saline  Post Treatment  Dialyzer Clearance Lightly streaked  Duration of HD Treatment -hour(s) 3.25 hour(s)  Liters Processed 78  Fluid Removed (mL) 2300 mL  Tolerated HD Treatment Yes  AVG/AVF Arterial Site Held (minutes) 10 minutes  AVG/AVF Venous Site Held (minutes) 10 minutes      Bryam Taborda [facility]

## 2022-06-22 NOTE — Procedures (Signed)
   I was present at this dialysis session, have reviewed the session itself and made  appropriate changes Kelly Splinter MD [place] pager [phone]   06/22/2022, 3:24 PM

## 2022-06-22 NOTE — ED Provider Triage Note (Signed)
Emergency Medicine Provider Triage Evaluation Note  Rachael Burke , a 67 y.o. female  was evaluated in triage.  Pt complains of abd pain nausea vomiting and diarrhea.   Sx began last night and worsened overnight. Feels similar to last visit in November. No fevers. Minimal urine (HD pt). No dysuria or urgency.   No Cp or SOB.    Review of Systems  Positive: NV, abd pain, diarrhea Negative: Fever   Physical Exam  BP (!) 156/64 (BP Location: Right Arm)   Pulse 74   Temp 98 F (36.7 C)   Resp 17   Ht 5' (1.524 m)   Wt 66.2 kg   SpO2 96%   BMI 28.51 kg/m  Gen:   Awake, acutely uncomfortable Resp:  Normal effort  MSK:   Moves extremities without difficulty  Other:  Abd soft but quite TTP L>R  Medical Decision Making  Medically screening exam initiated at 6:27 AM.  Appropriate orders placed.  Rachael Burke was informed that the remainder of the evaluation will be completed by another provider, this initial triage assessment does not replace that evaluation, and the importance of remaining in the ED until their evaluation is complete.     Pati Gallo [place], [place] 06/22/22 [phone]

## 2022-06-22 NOTE — ED Notes (Addendum)
Pt going to CT

## 2022-06-22 NOTE — H&P (Signed)
NAME:  Rachael Burke, MRN:  [id number], DOB:  23-Aug-1955, LOS: 0 ADMISSION DATE:  06/22/2022, Primary: Trey Sailors, PA  CHIEF COMPLAINT:  abdominal pain   Medical Service: Internal Medicine Teaching Service         Attending Physician: Dr. Aldine Contes, MD    First Contact: Dr. Jodell Cipro Pager: [phone]  Second Contact: Dr. 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 Ms. Rachael Burke reports she was in her usual state of health this weekend until early this morning (roughly 1AM) when she began having sharp stabbing pains in her upper abdomen that radiated to her back. The pain was so severe it woke her up from her sleep. Since this time she reports ~6 non-bloody episodes of watery diarrhea. She does report some nausea, but no vomiting. Her appetite this past weekend was seemingly normal, although reports she only ate 1/4 of quesadilla for dinner last night. She denies any changes in diet or undercooked food. Also denies any other members of her household that are sick. She denies fevers, chills, chest pain, dyspnea, cough, dysuria, weakness. Patient notes she has been compliant with dialysis and has not had any recent missed sessions. She does mention she makes some urine still.   PCP: Trey Sailors, PA  ED COURSE   Patient arrived to [facility] afebrile and hemodynamically stable, sating well on room air. Initial lab work revealed hyperkalemia, mild leukocytosis, and elevated lipase. Imaging revealed likely enterocolitis. Nephrology  was consulted for dialysis and IMTS subsequently consulted for admission.   PAST MEDICAL HISTORY   She,  has a past medical history of Anemia, Anxiety, Arthritis, Diabetes mellitus without complication ([place]), Heart murmur, History of blood transfusion, Hyperlipidemia, Hypertension, Pneumonia, PONV (postoperative nausea and vomiting), and Renal disorder.   HOME MEDICATIONS   Prior to Admission medications   Medication Sig Start Date End Date Taking? Authorizing Provider  acetaminophen (TYLENOL) 325 MG tablet Take 2 tablets (650 mg total) by mouth every 6 (six) hours as needed for mild pain or moderate pain. 09/05/21  Yes Carlisle Cater, PA-C  amLODipine (NORVASC) 10 MG tablet Take 1 tablet by mouth daily.   Yes [provider]  calcium acetate (PHOSLO) 667 MG capsule Take 667 mg by mouth 3 (three) times daily. 04/02/21  Yes [provider]  carvedilol (COREG) 12.5 MG tablet Take 1 tablet (12.5 mg total) by mouth 2 (two) times daily. 04/29/22 07/28/22 Yes Custovic, Collene Mares, DO  gabapentin (NEURONTIN) 100 MG capsule Take 300 mg by mouth at bedtime. 09/03/21  Yes [provider]  isosorbide-hydrALAZINE (BIDIL) 20-37.5 MG tablet Take 1 tablet by mouth 3 (three) times daily. 01/04/22  Yes Cherene Altes, MD  nitroGLYCERIN (NITROSTAT) 0.4 MG SL tablet Place 1 tablet (0.4 mg total) under the tongue every 5 (five) minutes as needed for chest pain. 01/09/22 06/22/22 Yes Custovic, Collene Mares, DO  omeprazole (PRILOSEC OTC) 20 MG tablet Take 20 mg by mouth daily as needed (indigestion).   Yes [provider]  ondansetron (ZOFRAN-ODT) 4 MG disintegrating tablet Take 1 tablet (4 mg  total) by mouth every 8 (eight) hours as needed for nausea or vomiting. 03/02/22  Yes Gareth Morgan, MD  polyethylene glycol (MIRALAX / GLYCOLAX) 17 g packet Take 17 g by mouth daily.   Yes [provider]  losartan (COZAAR) 100 MG tablet Take 1 tablet (100 mg total) by mouth at bedtime. Patient  not taking: Reported on 06/22/2022 01/22/22 04/22/22  Custovic, Collene Mares, DO  olopatadine (PATANOL) 0.1 % ophthalmic solution Place 1 drop into both eyes 2 (two) times daily as needed for allergies. Patient not taking: Reported on 03/26/2022    [provider]    ALLERGIES   Allergies as of 06/22/2022   (No Known Allergies)    SOCIAL HISTORY   Patient lives at home with her 34 year old daughter. Patient does not speak English, but relies on her daughter to help interpret while at doctors' appointments. She can complete ADL's and IADL's independently without difficulty. She denies any history or current use of tobacco products, alcohol, or recreational drugs.   FAMILY HISTORY   Her family history includes Diabetes in her brother, brother, mother, and sister; Hypertension in her father.   REVIEW OF SYSTEMS   ROS per history of present illness.  PHYSICAL EXAMINATION   Blood pressure (!) 165/72, pulse 70, temperature 98 F (36.7 C), temperature source Oral, resp. rate 15, height 5' (1.524 m), weight 66.2 kg, SpO2 95 %.    Filed Weights   06/22/22 0626  Weight: 66.2 kg   GENERAL: Tired appearing person in bed in no acute distress HENT: Normocephalic, atraumatic. Dry mucous membranes. EYES: Vision grossly in tact. No scleral icterus or conjunctival injection. CV: Regular rate, rhythm. 2/6 systolic murmur appreciated. Palpable thrill on LUE fistula site. Dorsalis pedis pulses 2+ bilaterally.  PULM: Normal work of breathing on room air. Clear to auscultation bilaterally.  GI: Abdomen soft, non-distended. Mildly tender throughout, most pronounced in epigastric area. No rebound tenderness or guarding. Murphy's negative.  MSK: Normal bulk, tone. No peripheral edema noted.  SKIN: Warm, dry. No rashes or lesions appreciated. NEURO: Awake, alert, conversing appropriately. Grossly without deficits. Strength 5/5 throughout, sensation in tact throughout. PSYCH: Normal mood, affect,  speech.   SIGNIFICANT DIAGNOSTIC TESTS   ECG: Sinus rhythm. Peaked T waves present, borderline prolonged PR interval.   I personally reviewed patient's ECG with my interpretation as above.  CXR: Normal imaging, no opacities, effusions appreciated.   I personally reviewed patient's CXR with my interpretation as above.  CT ABDOMEN: Fluid=filled small bowel and colon with diffuse, mild mesenteric fat stranding, query mild enterocolitis. No pancreatic ductal dilation or surrounding inflammatory changes. Aortic atherosclerosis.   LABS      Latest Ref Rng & Units 06/22/2022    6:42 AM 04/19/2022    3:44 PM 03/02/2022    8:21 AM  CBC  WBC 4.0 - 10.5 K/uL 13.2  8.5    Hemoglobin 12.0 - 15.0 g/dL 11.2  11.4  10.2   Hematocrit 36.0 - 46.0 % 35.6  34.8  30.0   Platelets 150 - 400 K/uL 155  189        Latest Ref Rng & Units 06/22/2022    7:50 AM 04/19/2022    3:44 PM 03/02/2022    4:31 PM  BMP  Glucose 70 - 99 mg/dL 181  141  76   BUN 8 - 23 mg/dL 79  17  24   Creatinine 0.44 - 1.00 mg/dL 9.42  4.65  4.14   Sodium 135 - 145  mmol/L 137  137  138   Potassium 3.5 - 5.1 mmol/L 6.9  3.7  4.3   Chloride 98 - 111 mmol/L 97  92  99   CO2 22 - 32 mmol/L 24  31  30    Calcium 8.9 - 10.3 mg/dL 7.6  8.8  8.8     CONSULTS   nephrology  ASSESSMENT   Neveah Bang de Guinevere Burke is [phone] person with end stage renal disease 2/2 hypertension, type II diabetes mellitus on MWF iHD, hypertension, 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   PLAN   Principal Problem:   Enterocolitis Active Problems:   Hyperkalemia  #Enterocolitis Patient's symptoms of abdominal pain, watery diarrhea, and nausea most consistent with enterocolitis. Her acute abdominal pain possibly is due to this along with some pancreatic involvement causing mild increase in lipase. Reviewed CT imaging, agree that this is  appears likely primarily enterocolitis. Given her symptoms are acute, she is afebrile, and without bloody diarrhea, will hold off GI stool testing at this time. She did receive 1/2 L bolus of IV fluids earlier in ED. Given she is ESRD will be cautious with further fluids. She did report she was thirsty to me, will allow her to eat as tolerated. - Supportive care - Zofran PRN - Imodium PRN  #Hyperkalemia #ESRD on MWF #Anemia of chronic disease K 6.9 on arrival with significant ECG changes from previous, including peaked T waves. In ED she was given calcium gluconate and insulin. She is scheduled to receive sodium bicarb and albuterol this afternoon. We will repeat K. In addition, she is scheduled for HD today. - Follow-up repeat potassium - HD per nephrology today - Daily BMP - Strict I/O  #Type II diabetes mellitus #Neuropathy Most recent A1c 4.9% in October 2023. Sugars mildly elevated here, 181 on arrival. She is not on insulin at home. Given poor po intake as well, will place on sliding scale.  - SSI - Continue home gabapentin 300mg  QHS  #Hypertension Mildly hypertensive since arrival, we will continue with her home medications.  - Isosorbide mononitrate 20-37.5mg  three times daily - Amlodipine 10mg  daily - Carvedilol 12.5mg  twice daily  BEST PRACTICE   DIET: CM IVF: s/p .5L bolus DVT PPX: heparin BOWEL: imodium PRN CODE: FULL FAM COM: Discussed with patient's daughter over the phone  DISPO: Admit patient to Observation with expected length of stay less than 2 midnights.  Sanjuan Dame, MD Internal Medicine Resident PGY-3 Pager [phone] 06/22/22 12:57 PM

## 2022-06-22 NOTE — ED Provider Notes (Signed)
Ultrasound ED Peripheral IV (Provider)  Date/Time: 06/22/2022 6:53 AM  Performed by: Tedd Sias, PA Authorized by: Tedd Sias, PA   Procedure details:    Indications: multiple failed IV attempts     Skin Prep: chlorhexidine gluconate     Location:  Right AC   Angiocath:  20 G   Bedside Ultrasound Guided: Yes     Images: archived     Patient tolerated procedure without complications: Yes     Dressing applied: Yes       Tedd Sias, PA 06/22/22 0654    Orpah Greek, MD 06/22/22 [phone]

## 2022-06-22 NOTE — ED Triage Notes (Signed)
Dialysis patient due to for dialysis today and during the night started having abd pain with diarrhea and then unable to have any more and having vomiting now. Patient is hypertensive due to not taking htn meds yesterday. Patient is spanish speaking.

## 2022-06-22 NOTE — ED Notes (Signed)
IV attempt x 2 unsuccessful PA to attempt [place] IV

## 2022-06-22 NOTE — ED Notes (Signed)
Patient transported to X-ray 

## 2022-06-22 NOTE — ED Provider Notes (Signed)
[facility] Provider Note   CSN: [id number] Arrival date & time: 06/22/22  2355     History  Chief Complaint  Patient presents with   Abdominal Pain    Rachael Burke de Rachael Burke is a 67 y.o. 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  Patient reports upper abdominal pain that woke her from sleep this morning at 1 AM.  It is associated with diarrhea. She has had 6 episodes of watery, nonbloody diarrhea over the past 6 hours.  Endorses nausea as well, but no vomiting.  No fever, cough, chest pain, dyspnea, or other concerns at this time.  Patient tried Tylenol without relief.  Missed dialysis this morning due to being in the ED, but otherwise has not missed any HD sessions recently.     Home Medications Prior to Admission medications   Medication Sig Start Date End Date Taking? Authorizing Provider  acetaminophen (TYLENOL) 325 MG tablet Take 2 tablets (650 mg total) by mouth every 6 (six) hours as needed for mild pain or moderate pain. 09/05/21  Yes Carlisle Cater, PA-C  amLODipine (NORVASC) 10 MG tablet Take 1 tablet by mouth daily.   Yes [provider]  calcium acetate (PHOSLO) 667 MG capsule Take 667 mg by mouth 3 (three) times daily. 04/02/21  Yes [provider]  carvedilol (COREG) 12.5 MG tablet Take 1 tablet (12.5 mg total) by mouth 2 (two) times daily. 04/29/22 07/28/22 Yes Custovic, Collene Mares, DO  gabapentin (NEURONTIN) 100 MG capsule Take 300 mg by mouth at bedtime. 09/03/21  Yes [provider]  isosorbide-hydrALAZINE (BIDIL) 20-37.5 MG tablet Take 1 tablet by mouth 3 (three) times daily. 01/04/22  Yes Cherene Altes, MD  nitroGLYCERIN (NITROSTAT) 0.4 MG SL tablet Place 1 tablet (0.4 mg total) under the tongue every 5 (five) minutes as needed for chest pain. 01/09/22  06/22/22 Yes Custovic, Collene Mares, DO  omeprazole (PRILOSEC OTC) 20 MG tablet Take 20 mg by mouth daily as needed (indigestion).   Yes [provider]  ondansetron (ZOFRAN-ODT) 4 MG disintegrating tablet Take 1 tablet (4 mg total) by mouth every 8 (eight) hours as needed for nausea or vomiting. 03/02/22  Yes Gareth Morgan, MD  polyethylene glycol (MIRALAX / GLYCOLAX) 17 g packet Take 17 g by mouth daily.   Yes [provider]  losartan (COZAAR) 100 MG tablet Take 1 tablet (100 mg total) by mouth at bedtime. Patient not taking: Reported on 06/22/2022 01/22/22 04/22/22  Custovic, Collene Mares, DO  olopatadine (PATANOL) 0.1 % ophthalmic solution Place 1 drop into both eyes 2 (two) times daily as needed for allergies. Patient not taking: Reported on 03/26/2022    [provider]      Allergies    Patient has no known allergies.    Review of Systems   Review of Systems  Constitutional:  Negative for fever.  Respiratory:  Negative for cough and shortness of breath.   Cardiovascular:  Negative for chest pain.  Gastrointestinal:  Positive for abdominal pain, diarrhea and nausea. Negative for blood in stool and vomiting.  Neurological:  Negative for dizziness.    Physical Exam Updated Vital Signs BP (!) 143/58   Pulse 68   Temp 98.5 F (36.9 C) (Oral)   Resp 13   Ht 5' (1.524 m)   Wt 66.2 kg   SpO2 98%  BMI 28.51 kg/m  Physical Exam Constitutional:      General: She is not in acute distress.    Comments: Uncomfortable appearing  Cardiovascular:     Rate and Rhythm: Normal rate and regular rhythm.     Heart sounds: Murmur heard.  Pulmonary:     Effort: Pulmonary effort is normal.     Breath sounds: Normal breath sounds.  Abdominal:     General: Bowel sounds are normal.     Palpations: Abdomen is soft.     Tenderness: There is abdominal tenderness in the right upper quadrant, epigastric area and left upper quadrant.  Skin:    General: Skin is warm and dry.   Neurological:     General: No focal deficit present.     Mental Status: She is alert.     ED Results / Procedures / Treatments   Labs (all labs ordered are listed, but only abnormal results are displayed) Labs Reviewed  LIPASE, BLOOD - Abnormal; Notable for the following components:      Result Value   Lipase 299 (*)    All other components within normal limits  CBC - Abnormal; Notable for the following components:   WBC 13.2 (*)    RBC 3.75 (*)    Hemoglobin 11.2 (*)    HCT 35.6 (*)    RDW 15.8 (*)    All other components within normal limits  BASIC METABOLIC PANEL - Abnormal; Notable for the following components:   Potassium 6.9 (*)    Chloride 97 (*)    Glucose, Bld 181 (*)    BUN 79 (*)    Creatinine, Ser 9.42 (*)    Calcium 7.6 (*)    GFR, Estimated 4 (*)    Anion gap 16 (*)    All other components within normal limits  HEPATIC FUNCTION PANEL - Abnormal; Notable for the following components:   Total Bilirubin 0.2 (*)    Indirect Bilirubin 0.0 (*)    All other components within normal limits  LIPASE, BLOOD - Abnormal; Notable for the following components:   Lipase 214 (*)    All other components within normal limits  RESP PANEL BY RT-PCR (RSV, FLU A&B, COVID)  [id number]  URINALYSIS, ROUTINE W REFLEX MICROSCOPIC  HEPATITIS B SURFACE ANTIGEN  HEPATITIS B SURFACE ANTIBODY, QUANTITATIVE    EKG EKG Interpretation  Date/Time:  Monday June 22 2022 06:38:56 EST Ventricular Rate:  71 PR Interval:  200 QRS Duration: 82 QT Interval:  416 QTC Calculation: 452 R Axis:   -5 Text Interpretation: * Poor data quality, interpretation may be adversely affected Normal sinus rhythm Nonspecific ST abnormality Abnormal ECG When compared with ECG of 02-Mar-2022 16:14, PREVIOUS ECG IS PRESENT when compared to prior, sharper t waves. No STEMI Confirmed by Antony Blackbird [phone]) on 06/22/2022 7:53:36 AM  Radiology DG Chest 1 View  Result Date: 06/22/2022 CLINICAL DATA:  Abdominal  pain. Concern for perforation. Mid chest pain and upper abdomen veins since earlier this morning. Missed last dialysis appointment. EXAM: CHEST  1 VIEW COMPARISON:  03/02/2022 FINDINGS: 0705 hours. Clear lungs. Stable cardiomegaly. Stable mediastinal contours with atherosclerotic calcifications and tortuosity of the aorta. No pleural effusion or pneumothorax. No free air under the diaphragm. Visualized bones are unremarkable. IMPRESSION: No acute cardiopulmonary findings or free air under the diaphragm. Electronically Signed   By: Emmit Alexanders M.D.   On: 06/22/2022 07:20   CT ABDOMEN WO CONTRAST  Result Date: 06/22/2022 CLINICAL DATA:  Abdominal pain, acute,  nonlocalized. Abdominal pain, nausea and vomiting with diarrhea since last night. EXAM: CT ABDOMEN WITHOUT CONTRAST TECHNIQUE: Multidetector CT imaging of the abdomen was performed following the standard protocol without IV contrast. RADIATION DOSE REDUCTION: This exam was performed according to the departmental dose-optimization program which includes automated exposure control, adjustment of the mA and/or kV according to patient size and/or use of iterative reconstruction technique. COMPARISON:  04/19/2022 FINDINGS: Lower chest: Lung bases are clear. No pleural or pericardial effusion. Aortic valve and coronary artery calcifications. Hepatobiliary: No focal liver abnormality is seen. Status post cholecystectomy. No biliary dilatation. Pancreas: Unremarkable. No pancreatic ductal dilatation or surrounding inflammatory changes. Spleen: Normal in size without focal abnormality. Adrenals/Urinary Tract: Adrenal glands and kidneys are unremarkable. No hydronephrosis. Decompressed bladder. Stomach/Bowel: Normal distal esophagus, stomach and duodenal. No dilated small bowel loops. Fluid-filled small bowel and colon with diffuse, mild mesenteric fat stranding, query mild enterocolitis. Vascular/Lymphatic: Atherosclerotic calcifications of the aorta and its  branches. No abdominal or pelvic lymphadenopathy. Other: Trace free fluid in the pelvis. Musculoskeletal: No acute or suspicious bone lesions. Mild degenerative changes of the spine. IMPRESSION: 1. Findings suggestive of mild enterocolitis. 2.  Aortic Atherosclerosis (ICD10-I70.0). Electronically Signed   By: Emmit Alexanders M.D.   On: 06/22/2022 07:14    Procedures Procedures   Medications Ordered in ED Medications  Chlorhexidine Gluconate Cloth 2 % PADS 6 each (has no administration in time range)  fentaNYL (SUBLIMAZE) injection 50 mcg (50 mcg Intravenous Given 06/22/22 0738)  ondansetron (ZOFRAN) injection 4 mg (4 mg Intravenous Given 06/22/22 0738)  sodium chloride 0.9 % bolus 500 mL (500 mLs Intravenous New Bag/Given 06/22/22 0941)  insulin aspart (novoLOG) injection 5 Units (5 Units Intravenous Given 06/22/22 0935)    And  dextrose 50 % solution 50 mL (50 mLs Intravenous Given 06/22/22 0936)  calcium gluconate inj 10% (1 g) URGENT USE ONLY! (1 g Intravenous Given 06/22/22 3570)    ED Course/ Medical Decision Making/ A&P                           Medical Decision Making This is a 67 year old female with PMH significant for HTN, T2DM, cervical SCC in situ, ESRD on HD MWF presenting with several hours of upper abdominal pain with associated watery diarrhea and nausea.  She is minimally hypertensive here but remainder vitals within normal limits.  Patient uncomfortable appearing on exam with diffuse upper abdominal tenderness. Differential includes viral gastroenteritis, colitis, pancreatitis, gastritis, less likely appendicitis or diverticulitis. Appears dry on exam and given her current GI losses will give 500cc fluid bolus. Fentanyl and Zofran administered for symptomatic relief. Basic labs and CT abdomen pending.  EKG with sharper T waves compared to prior but no ischemic changes. CBC notable for leukocytosis to 13.2 and BMP with hyperkalemia to 6.9. Lipase also elevated to 299. CT abdomen  consistent with mild enterocolitis, results independently reviewed.  Given presence of hyperkalemia with EKG changes, calcium and insulin/dextrose ordered. Discussed case with Dr. Jonnie Finner, on-call for nephrology, who plans to take patient for HD today.   Overall presentation suspicious for viral GI illness vs pancreatitis given elevated lipase. Patient still uncomfortable after dose of fentanyl and with ongoing diarrhea, not appropriate for PO challenge at this point. Feel patient would benefit from admission for further management of electrolytes and fluid status as well as pain control. Discussed case with Dr Collene Gobble with [facility] who accepts patient for admission.  Risk OTC drugs. Prescription drug  management. Decision regarding hospitalization.   Final Clinical Impression(s) / ED Diagnoses Final diagnoses:  Hyperkalemia  Pain of upper abdomen    Rx / DC Orders ED Discharge Orders     None         Alcus Dad, MD 06/22/22 1005    Tegeler, Gwenyth Allegra, MD 06/23/22 1018

## 2022-06-22 NOTE — Consult Note (Signed)
Renal Service Consult Note [facility]  Rachael Burke 06/22/2022 Sol Blazing, MD Requesting Physician: Dr. Dareen Piano  Reason for Consult: ESRD pt w/ abd pain , N/V/D HPI: The patient is a 67 y.o. year-old w/ PMH as below who presented to ED for abd pain that started overnight last night. She then had watery diarrhea 6 times and some nausea w/o vomiting. No sick contacts. Has not missed any HD. In ED VSS, K+ 6.9, mild ^wBC, and ^'d lipase. Imaging suggested likely enterocolitis. Pt admitted, we are asked to see for dialysis.   Pt seen in ED bed.  Pt's dtr is on the phone and acts as an interpreter. Pt c/o mild epigastric pain, no n/v today,  diarrhea has eased up.  No CP, SOB or LOC.   ROS - denies CP, no joint pain, no HA, no blurry vision, no rash, no diarrhea, no nausea/ vomiting, no dysuria, no difficulty voiding   Past Medical History  Past Medical History:  Diagnosis Date   Anemia    Anxiety    Arthritis    Diabetes mellitus without complication (HCC)    type 2   Heart murmur    echo 07/02/20: Mild MR/TR, mild-moderate AV sclerosis, bicuspid or functional bicuspid AV, no evidence of AS. Murmr felt due to AV.   History of blood transfusion    Hyperlipidemia    Hypertension    Pneumonia    PONV (postoperative nausea and vomiting)    Renal disorder    M-W-F   Past Surgical History  Past Surgical History:  Procedure Laterality Date   AV FISTULA PLACEMENT Left 05/20/2020   Procedure: INSERTION OF LEFT ARM ARTERIOVENOUS (AV) FISTULA;  Surgeon: Marty Heck, MD;  Location: [place] [facility];  Service: Vascular;  Laterality: Left;   [place] Left 08/05/2020   Procedure: SECOND STAGE BASILIC VEIN TRANSPOSITION;  Surgeon: Marty Heck, MD;  Location: [place];  Service: Vascular;  Laterality: Left;   BIOPSY  09/22/2021   Procedure: BIOPSY;  Surgeon: Ladene Artist, MD;  Location: [facility] ENDOSCOPY;  Service: Gastroenterology;;   CESAREAN  SECTION     CHOLECYSTECTOMY     ESOPHAGOGASTRODUODENOSCOPY (EGD) WITH PROPOFOL N/A 09/22/2021   Procedure: ESOPHAGOGASTRODUODENOSCOPY (EGD) WITH PROPOFOL;  Surgeon: Ladene Artist, MD;  Location: [facility] ENDOSCOPY;  Service: Gastroenterology;  Laterality: N/A;   INSERTION OF DIALYSIS CATHETER N/A 05/20/2020   Procedure: INSERTION OF DIALYSIS CATHETER;  Surgeon: Marty Heck, MD;  Location: [place];  Service: Vascular;  Laterality: N/A;   VIDEO BRONCHOSCOPY Bilateral 09/26/2021   Procedure: VIDEO BRONCHOSCOPY WITHOUT FLUORO;  Surgeon: Candee Furbish, MD;  Location: [facility] ENDOSCOPY;  Service: Pulmonary;  Laterality: Bilateral;   Family History  Family History  Problem Relation Age of Onset   Diabetes Mother    Hypertension Father    Diabetes Sister    Diabetes Brother    Diabetes Brother    Social History  reports that she has never smoked. She has never used smokeless tobacco. She reports that she does not drink alcohol and does not use drugs. Allergies No Known Allergies Home medications Prior to Admission medications   Medication Sig Start Date End Date Taking? Authorizing Provider  acetaminophen (TYLENOL) 325 MG tablet Take 2 tablets (650 mg total) by mouth every 6 (six) hours as needed for mild pain or moderate pain. 09/05/21  Yes Carlisle Cater, PA-C  amLODipine (NORVASC) 10 MG tablet Take 1 tablet by mouth daily.   Yes  [provider]  calcium acetate (PHOSLO) 667 MG capsule Take 667 mg by mouth 3 (three) times daily. 04/02/21  Yes [provider]  carvedilol (COREG) 12.5 MG tablet Take 1 tablet (12.5 mg total) by mouth 2 (two) times daily. 04/29/22 07/28/22 Yes Custovic, Collene Mares, DO  gabapentin (NEURONTIN) 100 MG capsule Take 300 mg by mouth at bedtime. 09/03/21  Yes [provider]  isosorbide-hydrALAZINE (BIDIL) 20-37.5 MG tablet Take 1 tablet by mouth 3 (three) times daily. 01/04/22  Yes Cherene Altes, MD  nitroGLYCERIN (NITROSTAT) 0.4 MG SL tablet Place 1  tablet (0.4 mg total) under the tongue every 5 (five) minutes as needed for chest pain. 01/09/22 06/22/22 Yes Custovic, Collene Mares, DO  omeprazole (PRILOSEC OTC) 20 MG tablet Take 20 mg by mouth daily as needed (indigestion).   Yes [provider]  ondansetron (ZOFRAN-ODT) 4 MG disintegrating tablet Take 1 tablet (4 mg total) by mouth every 8 (eight) hours as needed for nausea or vomiting. 03/02/22  Yes Gareth Morgan, MD  polyethylene glycol (MIRALAX / GLYCOLAX) 17 g packet Take 17 g by mouth daily.   Yes [provider]  losartan (COZAAR) 100 MG tablet Take 1 tablet (100 mg total) by mouth at bedtime. Patient not taking: Reported on 06/22/2022 01/22/22 04/22/22  Custovic, Collene Mares, DO  olopatadine (PATANOL) 0.1 % ophthalmic solution Place 1 drop into both eyes 2 (two) times daily as needed for allergies. Patient not taking: Reported on 03/26/2022    [provider]     Vitals:   06/22/22 1045 06/22/22 1124 06/22/22 1130 06/22/22 1215  BP: (!) 150/49  (!) 154/65 (!) 165/72  Pulse: 70  71 70  Resp: 18  16 15   Temp:  98 F (36.7 C)    TempSrc:  Oral    SpO2: 100%  97% 95%  Weight:      Height:       Exam Gen alert, no distress No rash, cyanosis or gangrene Sclera anicteric, throat clear  No jvd or bruits Chest clear bilat to bases, no rales/ wheezing RRR no MRG Abd soft, minimal epigastric tenderness no mass or ascites +bs GU defer MS no joint effusions or deformity Ext no LE or UE edema, no wounds or ulcers Neuro is alert, Ox 3 , nf    LUA AVF+bruit     Home meds include - norvasc, phoslo 1 ac tid, coreg 12.5 bid, neurontin 300 hs, bidil 20-37.5mg  1 tid, losartan 100 hs, omeprazole, prns/ vits/ supps     OP HD: [facility] MWF 3.5h  400/500  65.5kg  2/2 bath  AVF  Hep none - last HD 1/19 , post wt 65.6kg   - hectorol 3 mcg IV tiw - mircera 75 mcg IV q2, last 1/08, due to 1/22   Assessment/ Plan: Enterocolitis - w/ abd pain, N/V/D and CT imaging  suggesting enterocolitis. She rec'd IVF bolus in ED. Per pmd, stool GI testing not indicated given here lack of fever, acute sx's and no bloody diarrhea. Pt admitted. Per pmd.  Hyperkalemia - K+ 6.9 by labs, rec'd temporizing measures in ED and is up on HD now using low K+ bath.   ESRD - usual HD is MWF.  Plan HD mwf.  HTN/ volume - w/ GI illness do not expect marked vol overload. Legs/ arms w/o edema. BP's are in range.  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 Cont home meds and IV vdra tiw.  DM2 - per pmd      Kelly Splinter  MD 06/22/2022, 3:10 PM Recent Labs  Lab 06/22/22 0642 06/22/22 0750  HGB 11.2*  --   ALBUMIN  --  3.7  CALCIUM  --  7.6*  CREATININE  --  9.42*  K  --  6.9*   Inpatient medications:  albuterol  10 mg Nebulization Once   [START ON 06/23/2022] amLODipine  10 mg Oral Daily   carvedilol  12.5 mg Oral BID WC   Chlorhexidine Gluconate Cloth  6 each Topical Q0600   gabapentin  300 mg Oral QHS   heparin  5,000 Units Subcutaneous Q8H   isosorbide-hydrALAZINE  1 tablet Oral TID   sodium bicarbonate  50 mEq Intravenous Once    loperamide, ondansetron (ZOFRAN) IV

## 2022-06-23 DIAGNOSIS — K529 Noninfective gastroenteritis and colitis, unspecified: Secondary | ICD-10-CM

## 2022-06-23 LAB — CBC
HCT: 31.6 % — ABNORMAL LOW (ref 36.0–46.0)
Hemoglobin: 10.5 g/dL — ABNORMAL LOW (ref 12.0–15.0)
MCH: 30.7 pg (ref 26.0–34.0)
MCHC: 33.2 g/dL (ref 30.0–36.0)
MCV: 92.4 fL (ref 80.0–100.0)
Platelets: 133 10*3/uL — ABNORMAL LOW (ref 150–400)
RBC: 3.42 MIL/uL — ABNORMAL LOW (ref 3.87–5.11)
RDW: 15.2 % (ref 11.5–15.5)
WBC: 8.5 10*3/uL (ref 4.0–10.5)
nRBC: 0 % (ref 0.0–0.2)

## 2022-06-23 LAB — BASIC METABOLIC PANEL
Anion gap: 10 (ref 5–15)
BUN: 35 mg/dL — ABNORMAL HIGH (ref 8–23)
CO2: 29 mmol/L (ref 22–32)
Calcium: 8.1 mg/dL — ABNORMAL LOW (ref 8.9–10.3)
Chloride: 95 mmol/L — ABNORMAL LOW (ref 98–111)
Creatinine, Ser: 5.99 mg/dL — ABNORMAL HIGH (ref 0.44–1.00)
GFR, Estimated: 7 mL/min — ABNORMAL LOW (ref >60)
Glucose, Bld: 84 mg/dL (ref 70–99)
Potassium: 4.8 mmol/L (ref 3.5–5.1)
Sodium: 134 mmol/L — ABNORMAL LOW (ref 135–145)

## 2022-06-23 LAB — GLUCOSE, CAPILLARY
Glucose-Capillary: 89 mg/dL (ref 70–99)
Glucose-Capillary: 115 mg/dL — ABNORMAL HIGH (ref 70–99)

## 2022-06-23 LAB — HEPATITIS B SURFACE ANTIBODY, QUANTITATIVE: Hep B S AB Quant (Post): >1000 m[IU]/mL (ref >9.9)

## 2022-06-23 MED ORDER — INSULIN ASPART 100 UNIT/ML IJ SOLN: 0.0000 [IU] | Freq: Three times a day (TID) | INTRAMUSCULAR | Status: DC

## 2022-06-23 NOTE — Discharge Summary (Signed)
Name: Rachael Burke MRN: [id number] DOB: November 04, 1955 67 y.o. PCP: Trey Sailors, PA  Date of Admission: 06/22/2022  6:24 AM Date of Discharge: 06/23/22 Attending Physician: Dr. Dareen Piano  Discharge Diagnosis: Principal Problem:   Enterocolitis Active Problems:   Hyperkalemia    Discharge Medications: Allergies as of 06/23/2022   No Known Allergies      Medication List     TAKE these medications    acetaminophen 325 MG tablet Commonly known as: TYLENOL Take 2 tablets (650 mg total) by mouth every 6 (six) hours as needed for mild pain or moderate pain.   amLODipine 10 MG tablet Commonly known as: NORVASC Take 1 tablet by mouth daily.   calcium acetate 667 MG capsule Commonly known as: PHOSLO Take 667 mg by mouth 3 (three) times daily.   carvedilol 12.5 MG tablet Commonly known as: COREG Take 1 tablet (12.5 mg total) by mouth 2 (two) times daily.   gabapentin 100 MG capsule Commonly known as: NEURONTIN Take 300 mg by mouth at bedtime.   isosorbide-hydrALAZINE 20-37.5 MG tablet Commonly known as: BIDIL Take 1 tablet by mouth 3 (three) times daily.   losartan 100 MG tablet Commonly known as: COZAAR Take 1 tablet (100 mg total) by mouth at bedtime.   nitroGLYCERIN 0.4 MG SL tablet Commonly known as: NITROSTAT Place 1 tablet (0.4 mg total) under the tongue every 5 (five) minutes as needed for chest pain.   olopatadine 0.1 % ophthalmic solution Commonly known as: PATANOL Place 1 drop into both eyes 2 (two) times daily as needed for allergies.   omeprazole 20 MG tablet Commonly known as: PRILOSEC OTC Take 20 mg by mouth daily as needed (indigestion).   ondansetron 4 MG disintegrating tablet Commonly known as: ZOFRAN-ODT Take 1 tablet (4 mg total) by mouth every 8 (eight) hours as needed for nausea or vomiting.   polyethylene glycol 17 g packet Commonly known as: MIRALAX / GLYCOLAX Take 17 g by mouth daily.        Disposition and  follow-up:   Ms.[name] was discharged from [facility] in Stable condition.  At the hospital follow up visit please address:  1.  Follow-up:  a. Please reassess for GI symptoms    b. Continue normal HD schedule and followup   c. Titrate HTN and DM regimen as needed. No changes necessary during admisison  2.  Labs / imaging needed at time of follow-up: Renal function panel  3.  Pending labs/ test needing follow-up: None  Follow-up Appointments:  Follow-up Information     Trey Sailors, [place]. Schedule an appointment as soon as possible for a visit in 1 week(s).   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    #Enterocolitis Patient's symptoms of abdominal pain, watery diarrhea, and nausea most consistent with enterocolitis. Slightly elevated lipase nonspecific, No CT findings indicating pancreatitis, but does have signs of mild enterocolitis. Given her symptoms are acute, she is afebrile, and without bloody diarrhea, will hold off GI stool testing at this time. Received appropriate fluid resuscitation initially given acute  losses and ESRD status. Was dialyzed night of admission and felt better next day. Tolerating PO intake without further episodes of diarrhea or any n/v. Felt comfortable with dc today and continued outpatient supportive care.  #Hyperkalemia #ESRD on MWF #Anemia of chronic disease K 6.9 on arrival with significant ECG changes from previous, including peaked T waves. In ED she was given calcium gluconate and insulin, sodium  bicarb and albuterol. Received HD later on the night of admission with subsequent improvement in K to 3.8 and 4.8 on day of dc. Will continue Hd on normal schedule outpatient.   #Type II diabetes mellitus #Neuropathy Most recent A1c 4.9% in October 2023. Sugars mildly elevated here, 181 on arrival. She is not on insulin at home. Managed with SSI without issue.    #Hypertension Mildly hypertensive since arrival, home regimen continued   Subjective: Denies N/V post-HD. She states she did not eat much yesterday, she usually doesn't after HD. Endorses she ate breakfast this morning. Some abdominal pain on the left but not as bad as before. Discussed that she was admitted for hyperkalemia, possible discharge today if she is feeling well. She feels well to go home today.  Discharge Vitals:   BP (!) 177/65 (BP Location: Right Arm)   Pulse 69   Temp 98.6 F (37 C) (Oral)   Resp 18   Ht 5' (1.524 m)   Wt 65.2 kg   SpO2 100%   BMI 28.07 kg/m  Discharge exam: General: Pleasant, well-appearin, NAD. CV: RRR. 2/6 systolic likely flow murmur. No LE edema Pulmonary: Lungs CTAB. Normal effort. No wheezing or rales. Abdominal: Soft, minimal TTP epigastric region, nondistended. Normal bowel sounds. Extremities: Radial and DP pulses 2+ and symmetric. Normal ROM. LUE fistula with palpable thrill, CDI. Skin: Warm and dry. No obvious rash or lesions. Neuro: A&Ox3. Moves all extremities. Normal sensation. No focal deficit. Psych: Normal mood and affect   Pertinent Labs, Studies, and Procedures:     Latest Ref Rng & Units 06/23/2022    6:57 AM 06/22/2022    6:42 AM 04/19/2022    3:44 PM  CBC  WBC 4.0 - 10.5 K/uL 8.5  13.2  8.5   Hemoglobin 12.0 - 15.0 g/dL 10.5  11.2  11.4   Hematocrit 36.0 - 46.0 % 31.6  35.6  34.8   Platelets 150 - 400 K/uL 133  155  189        Latest Ref Rng & Units 06/23/2022    6:57 AM 06/22/2022    9:28 PM 06/22/2022    7:50 AM  CMP  Glucose 70 - 99 mg/dL 84   181    BUN 8 - 23 mg/dL 35   79   Creatinine 0.44 - 1.00 mg/dL 5.99   9.42   Sodium 135 - 145 mmol/L 134   137   Potassium 3.5 - 5.1 mmol/L 4.8  3.8  6.9   Chloride 98 - 111 mmol/L 95   97   CO2 22 - 32 mmol/L 29   24   Calcium 8.9 - 10.3 mg/dL 8.1   7.6   Total Protein 6.5 - 8.1 g/dL   6.6   Total Bilirubin 0.3 - 1.2 mg/dL   0.2   Alkaline Phos 38 - 126 U/L   82   AST 15 - 41 U/L   18   ALT 0 - 44 U/L   10     DG Chest 1 View  Result Date: 06/22/2022 CLINICAL DATA:  Abdominal  pain. Concern for perforation. Mid chest pain and upper abdomen veins since earlier this morning. Missed last dialysis appointment. EXAM: CHEST  1 VIEW COMPARISON:  03/02/2022 FINDINGS: 0705 hours. Clear lungs. Stable cardiomegaly. Stable mediastinal contours with atherosclerotic calcifications and tortuosity of the aorta. No pleural effusion or pneumothorax. No free air under the diaphragm. Visualized bones are unremarkable. IMPRESSION: No acute cardiopulmonary findings or free air under the diaphragm. Electronically Signed   By: Emmit Alexanders M.D.   On: 06/22/2022 07:20   CT ABDOMEN WO CONTRAST  Result Date: 06/22/2022 CLINICAL DATA:  Abdominal pain, acute, nonlocalized. Abdominal pain, nausea and vomiting with diarrhea since last night. EXAM: CT ABDOMEN WITHOUT CONTRAST TECHNIQUE: Multidetector CT imaging of the abdomen was performed following the standard protocol without IV contrast. RADIATION DOSE REDUCTION: This exam was performed according to the departmental dose-optimization program which includes automated exposure control, adjustment of the mA and/or kV according to patient size and/or use of iterative reconstruction technique. COMPARISON:  04/19/2022 FINDINGS: Lower chest: Lung bases are clear. No pleural or pericardial effusion. Aortic valve and coronary artery calcifications. Hepatobiliary: No focal liver abnormality is seen. Status post cholecystectomy. No biliary dilatation. Pancreas: Unremarkable. No  pancreatic ductal dilatation or surrounding inflammatory changes. Spleen: Normal in size without focal abnormality. Adrenals/Urinary Tract: Adrenal glands and kidneys are unremarkable. No hydronephrosis. Decompressed bladder. Stomach/Bowel: Normal distal esophagus, stomach and duodenal. No dilated small bowel loops. Fluid-filled small bowel and colon with diffuse, mild mesenteric fat stranding, query mild enterocolitis. Vascular/Lymphatic: Atherosclerotic calcifications of the aorta and its branches. No abdominal or pelvic lymphadenopathy. Other: Trace free fluid in the pelvis. Musculoskeletal: No acute or suspicious bone lesions. Mild degenerative changes of the spine. IMPRESSION: 1. Findings suggestive of mild enterocolitis. 2.  Aortic Atherosclerosis (ICD10-I70.0). Electronically Signed   By: Emmit Alexanders M.D.   On: 06/22/2022 07:14     Discharge Instructions: Discharge Instructions     Diet - low sodium heart healthy   Complete by: As directed    Discharge instructions   Complete by: As directed    Please make a follow up appointment with your primary doctor in about 1-2 weeks so they can make sure you are still doing alright. Please continue your current dialysis schedule.  If you start having nausea or vomiting, worse diarrhea, severe belly pain, or other new symptoms, please call your doctor or come back to the Emergency department to be evaluated.   Increase activity slowly   Complete by: As directed    No wound care   Complete by: As directed        Discharge Instructions   None     Signed: Linus Galas, MD 06/23/2022, 2:14 PM   Pager: [phone]

## 2022-06-23 NOTE — Progress Notes (Signed)
[facility] Progress Note   Subjective: Seen in room. Video interpreter used. No issues with dialysis yesterday. K improved. No new complaints today. Some nausea that she says is chronic. Denies cp, dyspnea, abd pain, vomiting, diarrhea. Says she may go home today.   Objective Vitals:   06/22/22 1900 06/22/22 1940 06/23/22 0507 06/23/22 0820  BP: (!) 194/76 (!) 180/62 (!) 147/59 (!) 177/65  Pulse: 72 74 67 69  Resp: 12 16 18 18   Temp: 98.2 F (36.8 C) 98.4 F (36.9 C) 98.9 F (37.2 C) 98.6 F (37 C)  TempSrc: Oral Oral  Oral  SpO2: 98% 98% 100% 100%  Weight:  65.2 kg    Height:         Additional Objective Labs: Basic Metabolic Panel: Recent Labs  Lab 06/22/22 0750 06/22/22 2128 06/23/22 0657  NA 137  --  134*  K 6.9* 3.8 4.8  CL 97*  --  95*  CO2 24  --  29  GLUCOSE 181*  --  84  BUN 79*  --  35*  CREATININE 9.42*  --  5.99*  CALCIUM 7.6*  --  8.1*   CBC: Recent Labs  Lab 06/22/22 0642 06/23/22 0657  WBC 13.2* 8.5  HGB 11.2* 10.5*  HCT 35.6* 31.6*  MCV 94.9 92.4  PLT 155 133*   Blood Culture    Component Value Date/Time   SDES BLOOD RIGHT HAND 11/02/2021 1152   SPECREQUEST  11/02/2021 1152    BOTTLES DRAWN AEROBIC AND ANAEROBIC Blood Culture adequate volume   CULT  11/02/2021 1152    NO GROWTH 5 DAYS Performed at [facility], [place] [address], [place], [place] [address]    REPTSTATUS 11/07/2021 FINAL 11/02/2021 1152     Physical Exam General: Well appearing, nad Heart: RRR No m, r Lungs: Clear bilaterally Abdomen: soft non-tender Extremities: No LE edema  Dialysis Access: LUE AVF +bruit   Medications:   albuterol  10 mg Nebulization Once   amLODipine  10 mg Oral Daily   carvedilol  12.5 mg Oral BID WC   Chlorhexidine Gluconate Cloth  6 each Topical Q0600   gabapentin  300 mg Oral QHS   heparin  5,000 Units Subcutaneous Q8H   insulin aspart  0-6 Units Subcutaneous TID WC   isosorbide-hydrALAZINE  1 tablet Oral TID    sodium bicarbonate  50 mEq Intravenous Once     OP HD: [facility] MWF 3.5h  400/500  65.5kg  2/2 bath  AVF  Hep none - last HD 1/19 , post wt 65.6kg   - hectorol 3 mcg IV tiw - mircera 75 mcg IV q2, last 1/08, due to 1/22     Assessment/ Plan: Enterocolitis - w/ abd pain, N/V/D and CT imaging suggesting enterocolitis. She rec'd IVF bolus in ED. Per pmd, stool GI testing not indicated given here lack of fever, acute sx's and no bloody diarrhea. Pt admitted. Per pmd. Resolved.  Hyperkalemia - K+ 6.9 by labs. Resolved with HD. K+ 4.8 today.  ESRD - usual HD is MWF.  Plan HD mwf. Next HD 1/24.  HTN/ volume - w/ GI illness do not expect marked vol overload. Legs/ arms w/o edema. BP's are in range.  Anemia esrd - esa due today but will hold while Hb is up MBD ckd - CCa in range, add on phos. Cont home meds and IV vdra tiw.  DM2 - per pmd Dispo - ok for discharge from renal standpoint  Lynnda Child PA-C [facility] 06/23/2022,11:35 AM

## 2022-06-23 NOTE — Progress Notes (Signed)
Pt was d/c to home today. Contacted [facility] to advise clinic of pt's d/c today and that pt should resume care tomorrow.   Melven Sartorius Renal Navigator [phone]

## 2022-06-23 NOTE — Progress Notes (Signed)
Internal Medicine Attending:   I saw and examined the patient. I reviewed the resident's note and I agree with the resident's findings and plan as documented in the resident's note.  In brief, patient is a 67 year old female with a past medical history of end-stage renal disease on hemodialysis, hypertension, type 2 diabetes, cervical squamous cell carcinoma in situ status post total hysterectomy and bilateral salpingo-oophorectomy who presented to the ED with sudden onset abdominal pain and diarrhea.  Patient states that she was using her usual health until yesterday morning when she developed sudden onset abdominal pain which is described as sharp and radiating to her back and is associated with watery diarrhea (approximately 6 episodes at home).  Patient did have some associated nausea at the time.  No sick contacts.  No fevers or chills, no chest pain, no shortness of breath.  Patient has been compliant with dialysis and has not missed any sessions.  In the ED, patient was noted to be significantly hyperkalemic with a potassium of 6.9 with an EKG showing peaked T waves.  She was admitted for further evaluation.  Today, patient states that she feels much better and is tolerating oral intake and has no abdominal pain.  Vitals:   06/23/22 0507 06/23/22 0820  BP: (!) 147/59 (!) 177/65  Pulse: 67 69  Resp: 18 18  Temp: 98.9 F (37.2 C) 98.6 F (37 C)  SpO2: 100% 100%   On exam, patient is lying in bed in no apparent distress.  Lungs are clear to auscultation bilaterally.  Cardiovascular exam reveals regular rate and rhythm with normal heart sounds.  Abdomen is soft, nontender, nondistended with normoactive bowel sounds.  Lower extremities are nontender to palpation with no edema noted.  Patient has a normal mood and affect.  Assessment and plan:  1.  Hyperkalemia in the setting of ESRD: -Patient noted to be significantly hyperkalemic on admission with a potassium of 6.9 and EKG changes (peaked T  waves) noted on admission.  Patient states that she goes for dialysis sessions regularly and etiology behind her significant hyperkalemia remains uncertain.  She is not on any medications that can cause significant hyperkalemia. -Patient received emergent dialysis yesterday -Hyperkalemia significantly improved and potassium is down to 4.8 today. -Nephrology follow-up and recommendations appreciated -No further workup at this time  2.  Enterocolitis: -Patient presented to ED with abdominal pain, nausea as well as diarrhea and was noted to have mild enterocolitis on imaging.  Patient did have mild leukocytosis on admission up to 13.2 which has since resolved. -Patient's symptoms appear to have resolved today.  Patient has no abdominal pain currently and is tolerating oral intake. -She denies any other episodes of diarrhea. -No further workup for now. -Patient stable for DC home today.

## 2022-06-23 NOTE — TOC Transition Note (Signed)
Transition of Care [facility]) - CM/SW Discharge Note   Patient Details  Name: Rachael Burke MRN: [id number] Date of Birth: August 20, 1955  Transition of Care [facility]) CM/SW Contact:  Tom-Johnson, Renea Ee, RN Phone Number: 06/23/2022, 3:09 PM   Clinical Narrative:     Patient is scheduled for discharge today. No TOC needs or recommendations noted. Family to transport at discharge. No further TOC needs noted.     Final next level of care: Home/Self Care Barriers to Discharge: Barriers Resolved   Patient Goals and CMS Choice CMS Medicare.gov Compare Post Acute Care list provided to:: Patient Choice offered to / list presented to : NA  Discharge Placement                  Patient to be transferred to facility by: Family      Discharge Plan and Services Additional resources added to the After Visit Summary for                  DME Arranged: N/A DME Agency: NA       HH Arranged: NA HH Agency: NA        Social Determinants of Health (SDOH) Interventions SDOH Screenings   Food Insecurity: No Food Insecurity (06/22/2022)  Housing: Low Risk  (06/22/2022)  Transportation Needs: No Transportation Needs (06/22/2022)  Utilities: Not At Risk (06/22/2022)  Depression (PHQ2-9): Low Risk  (04/06/2022)  Tobacco Use: Low Risk  (06/22/2022)     Readmission Risk Interventions     No data to display

## 2022-06-23 NOTE — Hospital Course (Signed)
1/23 Denies N/V post-HD. She states she did not eat much yesterday, she usually doesn't after HD. Endorses she ate breakfast this morning. Some abdominal pain on the left. Discussed that she was admitted for hyperkalemia, possible discharge today if she is feeling well.

## 2022-06-23 NOTE — Progress Notes (Signed)
DISCHARGE NOTE HOME Rachael Burke to be discharged Home per MD order. Translator used for discharge Rachael Burke [phone]. Diagnosis, treatment, prescriptions and follow up appointments discussed with the patient. Medication list explained in detail. Patient and daughter at bedside Rachael Burke verbalized knowledge and understanding. Skin clean, dry and intact without evidence of skin break down, no evidence of skin tears noted. IV catheter discontinued intact. Site without signs and symptoms of complications.  An After Visit Summary (AVS) was printed and given to the patient. Patient escorted via wheelchair, and discharged home via private auto.  Virgina Jock, RN

## 2022-06-24 ENCOUNTER — Telehealth: Payer: Self-pay | Admitting: Nephrology

## 2022-06-24 DIAGNOSIS — D631 Anemia in chronic kidney disease: Secondary | ICD-10-CM | POA: Diagnosis not present

## 2022-06-24 DIAGNOSIS — Z992 Dependence on renal dialysis: Secondary | ICD-10-CM | POA: Diagnosis not present

## 2022-06-24 DIAGNOSIS — N2581 Secondary hyperparathyroidism of renal origin: Secondary | ICD-10-CM | POA: Diagnosis not present

## 2022-06-24 DIAGNOSIS — D509 Iron deficiency anemia, unspecified: Secondary | ICD-10-CM | POA: Diagnosis not present

## 2022-06-24 DIAGNOSIS — N186 End stage renal disease: Secondary | ICD-10-CM | POA: Diagnosis not present

## 2022-06-24 DIAGNOSIS — R110 Nausea: Secondary | ICD-10-CM | POA: Diagnosis not present

## 2022-06-24 DIAGNOSIS — E1122 Type 2 diabetes mellitus with diabetic chronic kidney disease: Secondary | ICD-10-CM | POA: Diagnosis not present

## 2022-06-24 NOTE — Telephone Encounter (Signed)
Transition of care contact from inpatient facility  Date of discharge: 06/23/22 Date of contact: 06/24/22 Method: Phone Spoke to: Patient's daughter   Patient contacted to discuss transition of care from recent inpatient hospitalization. Patient was admitted to [facility] from 1/22 -06/23/22 with discharge diagnosis of enterocolitis and hyperkalemia   Medication changes were reviewed. No changes made.   Patient will follow up with his/her outpatient HD unit on: She went to dialysis today. Her next treatment will be Friday

## 2022-06-26 ENCOUNTER — Other Ambulatory Visit (INDEPENDENT_AMBULATORY_CARE_PROVIDER_SITE_OTHER): Payer: 59

## 2022-06-26 ENCOUNTER — Ambulatory Visit (INDEPENDENT_AMBULATORY_CARE_PROVIDER_SITE_OTHER): Payer: 59 | Admitting: Neurology

## 2022-06-26 ENCOUNTER — Encounter: Payer: Self-pay | Admitting: Neurology

## 2022-06-26 VITALS — BP 123/65 | HR 73 | Ht 62.0 in | Wt 144.6 lb

## 2022-06-26 DIAGNOSIS — R42 Dizziness and giddiness: Secondary | ICD-10-CM | POA: Diagnosis not present

## 2022-06-26 DIAGNOSIS — R110 Nausea: Secondary | ICD-10-CM | POA: Diagnosis not present

## 2022-06-26 DIAGNOSIS — N186 End stage renal disease: Secondary | ICD-10-CM | POA: Diagnosis not present

## 2022-06-26 DIAGNOSIS — R519 Headache, unspecified: Secondary | ICD-10-CM

## 2022-06-26 DIAGNOSIS — N2581 Secondary hyperparathyroidism of renal origin: Secondary | ICD-10-CM | POA: Diagnosis not present

## 2022-06-26 DIAGNOSIS — D631 Anemia in chronic kidney disease: Secondary | ICD-10-CM | POA: Diagnosis not present

## 2022-06-26 DIAGNOSIS — R2681 Unsteadiness on feet: Secondary | ICD-10-CM | POA: Diagnosis not present

## 2022-06-26 DIAGNOSIS — D649 Anemia, unspecified: Secondary | ICD-10-CM | POA: Diagnosis not present

## 2022-06-26 DIAGNOSIS — R269 Unspecified abnormalities of gait and mobility: Secondary | ICD-10-CM

## 2022-06-26 DIAGNOSIS — Z992 Dependence on renal dialysis: Secondary | ICD-10-CM | POA: Diagnosis not present

## 2022-06-26 DIAGNOSIS — D509 Iron deficiency anemia, unspecified: Secondary | ICD-10-CM | POA: Diagnosis not present

## 2022-06-26 DIAGNOSIS — Z1231 Encounter for screening mammogram for malignant neoplasm of breast: Secondary | ICD-10-CM

## 2022-06-26 DIAGNOSIS — E1122 Type 2 diabetes mellitus with diabetic chronic kidney disease: Secondary | ICD-10-CM | POA: Diagnosis not present

## 2022-06-26 DIAGNOSIS — M542 Cervicalgia: Secondary | ICD-10-CM | POA: Diagnosis not present

## 2022-06-26 DIAGNOSIS — R0609 Other forms of dyspnea: Secondary | ICD-10-CM | POA: Diagnosis not present

## 2022-06-26 DIAGNOSIS — E119 Type 2 diabetes mellitus without complications: Secondary | ICD-10-CM | POA: Diagnosis not present

## 2022-06-26 DIAGNOSIS — E782 Mixed hyperlipidemia: Secondary | ICD-10-CM | POA: Diagnosis not present

## 2022-06-26 MED ORDER — MECLIZINE HCL 12.5 MG PO TABS: 12.5000 mg | ORAL_TABLET | Freq: Two times a day (BID) | ORAL | 1 refills | Status: AC | PRN

## 2022-06-26 MED ORDER — TOPIRAMATE 25 MG PO TABS: 25.0000 mg | ORAL_TABLET | Freq: Every day | ORAL | 3 refills | Status: DC

## 2022-06-26 NOTE — Patient Instructions (Signed)
I would like to get an MRI of your brain and cervical spine.  I would like to get blood work today.  I will start a medication as needed for dizziness, Meclizine. You can take it twice daily as needed.  I will start a medication for headaches called Topiramate. You will take this every night at bedtime to prevent headaches.  I will do low doses to prevent side effects and because you are on dialysis.  I would like to see you back in clinic in 1 month.  The physicians and staff at [facility] are committed to providing excellent care. You may receive a survey requesting feedback about your experience at our office. We strive to receive "very good" responses to the survey questions. If you feel that your experience would prevent you from giving the office a "very good " response, please contact our office to try to remedy the situation. We may be reached at [phone]. Thank you for taking the time out of your busy day to complete the survey.  Kai Levins, MD [facility]

## 2022-06-29 DIAGNOSIS — R110 Nausea: Secondary | ICD-10-CM | POA: Diagnosis not present

## 2022-06-29 DIAGNOSIS — N186 End stage renal disease: Secondary | ICD-10-CM | POA: Diagnosis not present

## 2022-06-29 DIAGNOSIS — D509 Iron deficiency anemia, unspecified: Secondary | ICD-10-CM | POA: Diagnosis not present

## 2022-06-29 DIAGNOSIS — N2581 Secondary hyperparathyroidism of renal origin: Secondary | ICD-10-CM | POA: Diagnosis not present

## 2022-06-29 DIAGNOSIS — D631 Anemia in chronic kidney disease: Secondary | ICD-10-CM | POA: Diagnosis not present

## 2022-06-29 DIAGNOSIS — E1122 Type 2 diabetes mellitus with diabetic chronic kidney disease: Secondary | ICD-10-CM | POA: Diagnosis not present

## 2022-06-29 DIAGNOSIS — Z992 Dependence on renal dialysis: Secondary | ICD-10-CM | POA: Diagnosis not present

## 2022-06-30 LAB — VITAMIN E
Gamma-Tocopherol (Vit E): <1 mg/L (ref <=4.3)
Vitamin E (Alpha Tocopherol): 11.5 mg/L (ref 5.7–19.9)

## 2022-06-30 LAB — COPPER, SERUM: Copper: 97 ug/dL (ref 70–175)

## 2022-06-30 LAB — TSH: TSH: 2.67 mIU/L (ref 0.40–4.50)

## 2022-07-01 DIAGNOSIS — Z992 Dependence on renal dialysis: Secondary | ICD-10-CM | POA: Diagnosis not present

## 2022-07-01 DIAGNOSIS — K529 Noninfective gastroenteritis and colitis, unspecified: Secondary | ICD-10-CM | POA: Diagnosis not present

## 2022-07-01 DIAGNOSIS — N186 End stage renal disease: Secondary | ICD-10-CM | POA: Diagnosis not present

## 2022-07-01 DIAGNOSIS — E1122 Type 2 diabetes mellitus with diabetic chronic kidney disease: Secondary | ICD-10-CM | POA: Diagnosis not present

## 2022-07-01 DIAGNOSIS — E875 Hyperkalemia: Secondary | ICD-10-CM | POA: Diagnosis not present

## 2022-07-01 DIAGNOSIS — E1129 Type 2 diabetes mellitus with other diabetic kidney complication: Secondary | ICD-10-CM | POA: Diagnosis not present

## 2022-07-01 DIAGNOSIS — N2581 Secondary hyperparathyroidism of renal origin: Secondary | ICD-10-CM | POA: Diagnosis not present

## 2022-07-01 DIAGNOSIS — R110 Nausea: Secondary | ICD-10-CM | POA: Diagnosis not present

## 2022-07-01 DIAGNOSIS — D509 Iron deficiency anemia, unspecified: Secondary | ICD-10-CM | POA: Diagnosis not present

## 2022-07-01 DIAGNOSIS — R1010 Upper abdominal pain, unspecified: Secondary | ICD-10-CM | POA: Diagnosis not present

## 2022-07-01 DIAGNOSIS — D631 Anemia in chronic kidney disease: Secondary | ICD-10-CM | POA: Diagnosis not present

## 2022-07-03 DIAGNOSIS — N186 End stage renal disease: Secondary | ICD-10-CM | POA: Diagnosis not present

## 2022-07-03 DIAGNOSIS — N2581 Secondary hyperparathyroidism of renal origin: Secondary | ICD-10-CM | POA: Diagnosis not present

## 2022-07-03 DIAGNOSIS — D631 Anemia in chronic kidney disease: Secondary | ICD-10-CM | POA: Diagnosis not present

## 2022-07-03 DIAGNOSIS — E1122 Type 2 diabetes mellitus with diabetic chronic kidney disease: Secondary | ICD-10-CM | POA: Diagnosis not present

## 2022-07-03 DIAGNOSIS — D509 Iron deficiency anemia, unspecified: Secondary | ICD-10-CM | POA: Diagnosis not present

## 2022-07-03 DIAGNOSIS — Z992 Dependence on renal dialysis: Secondary | ICD-10-CM | POA: Diagnosis not present

## 2022-07-06 DIAGNOSIS — D631 Anemia in chronic kidney disease: Secondary | ICD-10-CM | POA: Diagnosis not present

## 2022-07-06 DIAGNOSIS — E1122 Type 2 diabetes mellitus with diabetic chronic kidney disease: Secondary | ICD-10-CM | POA: Diagnosis not present

## 2022-07-06 DIAGNOSIS — N2581 Secondary hyperparathyroidism of renal origin: Secondary | ICD-10-CM | POA: Diagnosis not present

## 2022-07-06 DIAGNOSIS — N186 End stage renal disease: Secondary | ICD-10-CM | POA: Diagnosis not present

## 2022-07-06 DIAGNOSIS — Z992 Dependence on renal dialysis: Secondary | ICD-10-CM | POA: Diagnosis not present

## 2022-07-06 DIAGNOSIS — D509 Iron deficiency anemia, unspecified: Secondary | ICD-10-CM | POA: Diagnosis not present

## 2022-07-08 DIAGNOSIS — D509 Iron deficiency anemia, unspecified: Secondary | ICD-10-CM | POA: Diagnosis not present

## 2022-07-08 DIAGNOSIS — N186 End stage renal disease: Secondary | ICD-10-CM | POA: Diagnosis not present

## 2022-07-08 DIAGNOSIS — E1122 Type 2 diabetes mellitus with diabetic chronic kidney disease: Secondary | ICD-10-CM | POA: Diagnosis not present

## 2022-07-08 DIAGNOSIS — Z992 Dependence on renal dialysis: Secondary | ICD-10-CM | POA: Diagnosis not present

## 2022-07-08 DIAGNOSIS — N2581 Secondary hyperparathyroidism of renal origin: Secondary | ICD-10-CM | POA: Diagnosis not present

## 2022-07-08 DIAGNOSIS — D631 Anemia in chronic kidney disease: Secondary | ICD-10-CM | POA: Diagnosis not present

## 2022-07-10 DIAGNOSIS — Z992 Dependence on renal dialysis: Secondary | ICD-10-CM | POA: Diagnosis not present

## 2022-07-10 DIAGNOSIS — N186 End stage renal disease: Secondary | ICD-10-CM | POA: Diagnosis not present

## 2022-07-10 DIAGNOSIS — N2581 Secondary hyperparathyroidism of renal origin: Secondary | ICD-10-CM | POA: Diagnosis not present

## 2022-07-10 DIAGNOSIS — D631 Anemia in chronic kidney disease: Secondary | ICD-10-CM | POA: Diagnosis not present

## 2022-07-10 DIAGNOSIS — E1122 Type 2 diabetes mellitus with diabetic chronic kidney disease: Secondary | ICD-10-CM | POA: Diagnosis not present

## 2022-07-10 DIAGNOSIS — D509 Iron deficiency anemia, unspecified: Secondary | ICD-10-CM | POA: Diagnosis not present

## 2022-07-12 ENCOUNTER — Ambulatory Visit
Admission: RE | Admit: 2022-07-12 | Discharge: 2022-07-12 | Disposition: A | Discharge status: Home / Self Care | Payer: 59 | Source: Ambulatory Visit | Attending: Neurology | Admitting: Neurology

## 2022-07-12 DIAGNOSIS — R519 Headache, unspecified: Secondary | ICD-10-CM

## 2022-07-12 DIAGNOSIS — R42 Dizziness and giddiness: Secondary | ICD-10-CM | POA: Diagnosis not present

## 2022-07-12 DIAGNOSIS — M542 Cervicalgia: Secondary | ICD-10-CM

## 2022-07-12 DIAGNOSIS — M4802 Spinal stenosis, cervical region: Secondary | ICD-10-CM | POA: Diagnosis not present

## 2022-07-12 DIAGNOSIS — R2681 Unsteadiness on feet: Secondary | ICD-10-CM

## 2022-07-12 DIAGNOSIS — R269 Unspecified abnormalities of gait and mobility: Secondary | ICD-10-CM

## 2022-07-13 DIAGNOSIS — D509 Iron deficiency anemia, unspecified: Secondary | ICD-10-CM | POA: Diagnosis not present

## 2022-07-13 DIAGNOSIS — Z992 Dependence on renal dialysis: Secondary | ICD-10-CM | POA: Diagnosis not present

## 2022-07-13 DIAGNOSIS — N2581 Secondary hyperparathyroidism of renal origin: Secondary | ICD-10-CM | POA: Diagnosis not present

## 2022-07-13 DIAGNOSIS — E1122 Type 2 diabetes mellitus with diabetic chronic kidney disease: Secondary | ICD-10-CM | POA: Diagnosis not present

## 2022-07-13 DIAGNOSIS — N186 End stage renal disease: Secondary | ICD-10-CM | POA: Diagnosis not present

## 2022-07-13 DIAGNOSIS — D631 Anemia in chronic kidney disease: Secondary | ICD-10-CM | POA: Diagnosis not present

## 2022-07-14 ENCOUNTER — Ambulatory Visit (INDEPENDENT_AMBULATORY_CARE_PROVIDER_SITE_OTHER): Payer: 59 | Admitting: Nurse Practitioner

## 2022-07-14 ENCOUNTER — Encounter: Payer: Self-pay | Admitting: Nurse Practitioner

## 2022-07-14 VITALS — BP 173/47 | HR 66 | Temp 97.7°F | Ht 62.0 in | Wt 149.4 lb

## 2022-07-14 DIAGNOSIS — K529 Noninfective gastroenteritis and colitis, unspecified: Secondary | ICD-10-CM | POA: Diagnosis not present

## 2022-07-14 DIAGNOSIS — E119 Type 2 diabetes mellitus without complications: Secondary | ICD-10-CM

## 2022-07-14 DIAGNOSIS — N186 End stage renal disease: Secondary | ICD-10-CM

## 2022-07-14 DIAGNOSIS — R519 Headache, unspecified: Secondary | ICD-10-CM | POA: Diagnosis not present

## 2022-07-14 DIAGNOSIS — E782 Mixed hyperlipidemia: Secondary | ICD-10-CM

## 2022-07-14 DIAGNOSIS — I10 Essential (primary) hypertension: Secondary | ICD-10-CM | POA: Diagnosis not present

## 2022-07-14 DIAGNOSIS — J810 Acute pulmonary edema: Secondary | ICD-10-CM | POA: Diagnosis not present

## 2022-07-14 DIAGNOSIS — Z992 Dependence on renal dialysis: Secondary | ICD-10-CM | POA: Diagnosis not present

## 2022-07-14 DIAGNOSIS — R159 Full incontinence of feces: Secondary | ICD-10-CM

## 2022-07-14 NOTE — Patient Instructions (Addendum)
Please get your TDAP vaccine, second dose of shingles vaccine at the pharmacy   Thanks for choosing [facility] we consider it a privelige to serve you.

## 2022-07-14 NOTE — Assessment & Plan Note (Signed)
BP Readings from Last 3 Encounters:  07/14/22 (!) 173/47  06/26/22 123/65  06/23/22 (!) 177/65   She is currently on carvedilol 12.5 mg twice daily, isosorbide-hydralazine 20-37.51 tablet 3 times daily, amlodipine 10 mg daily. Need to take all medications daily as ordered were discussed with the patient DASH diet advised engage in regular daily walking exercises as tolerated

## 2022-07-14 NOTE — Assessment & Plan Note (Signed)
Referral placed to GI about 3 months ago , GI was unable to reach the patient, referral placed again today.

## 2022-07-14 NOTE — Assessment & Plan Note (Signed)
SHe has been attending dialysis regularly on Mondays Wednesdays Fridays. Patient encouraged to maintain close follow-up with nephrologist

## 2022-07-14 NOTE — Assessment & Plan Note (Signed)
Now resolved,patient denies shortness of breath, cough, wheezing, orthopnea

## 2022-07-14 NOTE — Assessment & Plan Note (Signed)
Reports that her HA is better , Has topomax 5m daily ordered but her daughter stated that she has not been taking the medication daily , she was encouraged to take med daily as ordered and follow up with neurology

## 2022-07-14 NOTE — Assessment & Plan Note (Addendum)
Currently not on medications Will check A1c today Avoid sugar sweets soda We requested for records of her diabetic eye exam today

## 2022-07-14 NOTE — Assessment & Plan Note (Signed)
She was on admission at the hospital 06/22/2022 Patient currently denies abdominal pain, fever, chills, vomiting. Has some nausea, she was encouraged to take zofran 65m every 8 hours as needed.

## 2022-07-14 NOTE — Progress Notes (Signed)
Established Patient Office Visit  Subjective:  Patient ID: Rachael Burke, female    DOB: [birth year]  Age: 67 y.o. MRN: [id number]  CC:  Chief Complaint  Patient presents with   Follow-up    F/u diabetes. Pt experiencing dizziness.    Diabetes   Dizziness    HPI Rachael Burke is a 67 y.o. female with past medical history of end-stage renal disease secondary to hypertension, type 2 diabetes, hypertension, cervical squamous cell carcinoma in situ status post total hysterectomy and bilateral salpingo oophorectomy, enterocolitis, hypokalemia,, non-intractable headaches, anemia of chronic disease, dizziness presents for follow-up for her chronic medical conditions.  Enterocolitis she was admitted at the hospital on 06/22/2022 discharged on 06/23/2022 for enterocolitis, hyperkalemia.  Today patient denies abdominal pain, watery diarrhea, vomiting.  Still has some nausea.  She was referred to GI about 3 months ago due to complaints of fecal incontinence GI attempted to reach her daughter without success.  Referral resent today.    Hypertension.  She is currently on carvedilol 12.5 mg twice daily, isosorbide-hydralazine 20-37.51 tablet 3 times daily, amlodipine 10 mg daily.  Reported that she was taken off losartan by nephrology.  She reported that she forgot to take her medications yesterday and she has not taken them today.  Need to take medications daily as ordered discussed.  She denies chest pain, edema.  She continues to attend dialysis every Monday Wednesdays and Friday.   She has upcoming appointment with neurologist at the end of this month reports that her headache is not as bad as before has Topamax ordered but she has not been taking the medication daily.  Still experiences dizziness.   She is accompanied by her daughter today who assisted with Spanish interpretation    Past Medical History:  Diagnosis Date   Anemia    Anxiety    Arthritis    Diabetes  mellitus without complication ([place])    type 2   Heart murmur    echo 07/02/20: Mild MR/TR, mild-moderate AV sclerosis, bicuspid or functional bicuspid AV, no evidence of AS. Murmr felt due to AV.   History of blood transfusion    Hyperlipidemia    Hypertension    Pneumonia    PONV (postoperative nausea and vomiting)    Renal disorder    M-W-F    Past Surgical History:  Procedure Laterality Date   AV FISTULA PLACEMENT Left 05/20/2020   Procedure: INSERTION OF LEFT ARM ARTERIOVENOUS (AV) FISTULA;  Surgeon: Marty Heck, MD;  Location: [place];  Service: Vascular;  Laterality: Left;   [place] Left 08/05/2020   Procedure: SECOND STAGE BASILIC VEIN TRANSPOSITION;  Surgeon: Marty Heck, MD;  Location: [place];  Service: Vascular;  Laterality: Left;   BIOPSY  09/22/2021   Procedure: BIOPSY;  Surgeon: Ladene Artist, MD;  Location: [facility] ENDOSCOPY;  Service: Gastroenterology;;   BLADDER REPAIR     nov 2023   CESAREAN SECTION     CHOLECYSTECTOMY     ESOPHAGOGASTRODUODENOSCOPY (EGD) WITH PROPOFOL N/A 09/22/2021   Procedure: ESOPHAGOGASTRODUODENOSCOPY (EGD) WITH PROPOFOL;  Surgeon: Ladene Artist, MD;  Location: [facility] ENDOSCOPY;  Service: Gastroenterology;  Laterality: N/A;   INSERTION OF DIALYSIS CATHETER N/A 05/20/2020   Procedure: INSERTION OF DIALYSIS CATHETER;  Surgeon: Marty Heck, MD;  Location: [place];  Service: Vascular;  Laterality: N/A;   VAGINAL HYSTERECTOMY     nov 2023   VIDEO BRONCHOSCOPY Bilateral 09/26/2021   Procedure: VIDEO BRONCHOSCOPY WITHOUT  FLUORO;  Surgeon: Candee Furbish, MD;  Location: [facility] ENDOSCOPY;  Service: Pulmonary;  Laterality: Bilateral;    Family History  Problem Relation Age of Onset   Diabetes Mother    Hypertension Father    Diabetes Sister    Diabetes Brother    Diabetes Brother     Social History   Socioeconomic History   Marital status: Married    Spouse name: Not on file   Number of children: Not on  file   Years of education: Not on file   Highest education level: Not on file  Occupational History   Not on file  Tobacco Use   Smoking status: Never   Smokeless tobacco: Never  Vaping Use   Vaping Use: Never used  Substance and Sexual Activity   Alcohol use: Never   Drug use: Never   Sexual activity: Not on file  Other Topics Concern   Not on file  Social History Narrative   Right handed   Caffeine 1/2 cup daily   One story home   Lives with daughter   retired   Science writer Determinants of Radio broadcast assistant Strain: Not on file  Food Insecurity: No Food Insecurity (06/22/2022)   Hunger Vital Sign    Worried About Running Out of Food in the Last Year: Never true    [place] in the Last Year: Never true  Transportation Needs: No Transportation Needs (06/22/2022)   PRAPARE - Hydrologist (Medical): No    Lack of Transportation (Non-Medical): No  Physical Activity: Not on file  Stress: Not on file  Social Connections: Not on file  Intimate Partner Violence: Not At Risk (06/22/2022)   Humiliation, Afraid, Rape, and Kick questionnaire    Fear of Current or Ex-Partner: No    Emotionally Abused: No    Physically Abused: No    Sexually Abused: No    Outpatient Medications Prior to Visit  Medication Sig Dispense Refill   acetaminophen (TYLENOL) 325 MG tablet Take 2 tablets (650 mg total) by mouth every 6 (six) hours as needed for mild pain or moderate pain. 30 tablet 0   amLODipine (NORVASC) 10 MG tablet Take 1 tablet by mouth daily.     calcium acetate (PHOSLO) 667 MG capsule Take 667 mg by mouth 3 (three) times daily.     carvedilol (COREG) 12.5 MG tablet Take 1 tablet (12.5 mg total) by mouth 2 (two) times daily. 180 tablet 3   gabapentin (NEURONTIN) 100 MG capsule Take 300 mg by mouth at bedtime.     isosorbide-hydrALAZINE (BIDIL) 20-37.5 MG tablet Take 1 tablet by mouth 3 (three) times daily. 90 tablet 2   meclizine (ANTIVERT) 12.5  MG tablet Take 1 tablet (12.5 mg total) by mouth 2 (two) times daily as needed for dizziness. 60 tablet 1   nitroGLYCERIN (NITROSTAT) 0.4 MG SL tablet Place 1 tablet (0.4 mg total) under the tongue every 5 (five) minutes as needed for chest pain. 90 tablet 3   omeprazole (PRILOSEC OTC) 20 MG tablet Take 20 mg by mouth daily as needed (indigestion).     ondansetron (ZOFRAN-ODT) 4 MG disintegrating tablet Take 1 tablet (4 mg total) by mouth every 8 (eight) hours as needed for nausea or vomiting. 20 tablet 0   polyethylene glycol (MIRALAX / GLYCOLAX) 17 g packet Take 17 g by mouth daily.     olopatadine (PATANOL) 0.1 % ophthalmic solution Place 1 drop into both eyes  2 (two) times daily as needed for allergies. (Patient not taking: Reported on 07/14/2022)     topiramate (TOPAMAX) 25 MG tablet Take 1 tablet (25 mg total) by mouth daily. (Patient not taking: Reported on 07/14/2022) 120 tablet 3   losartan (COZAAR) 100 MG tablet Take 1 tablet (100 mg total) by mouth at bedtime. (Patient not taking: Reported on 06/22/2022) 30 tablet 2   No facility-administered medications prior to visit.    Allergies  Allergen Reactions   Ceftriaxone     ROS Review of Systems  Constitutional:  Negative for activity change, appetite change, chills, diaphoresis, fatigue and fever.  Respiratory:  Negative for apnea, cough, choking, chest tightness and shortness of breath.   Cardiovascular: Negative.  Negative for chest pain, palpitations and leg swelling.  Gastrointestinal:  Positive for nausea. Negative for abdominal distention, abdominal pain, anal bleeding, blood in stool and vomiting.  Genitourinary:  Negative for flank pain, frequency, hematuria and pelvic pain.  Musculoskeletal:  Negative for arthralgias, back pain and gait problem.  Neurological:  Positive for dizziness and headaches. Negative for tremors, seizures, facial asymmetry, speech difficulty, light-headedness and numbness.  Psychiatric/Behavioral:  Negative.  Negative for agitation, behavioral problems, confusion, decreased concentration and dysphoric mood.       Objective:    Physical Exam Constitutional:      General: She is not in acute distress.    Appearance: Normal appearance. She is not ill-appearing, toxic-appearing or diaphoretic.  Eyes:     General: No scleral icterus.       Right eye: No discharge.        Left eye: No discharge.     Extraocular Movements: Extraocular movements intact.  Cardiovascular:     Rate and Rhythm: Normal rate and regular rhythm.     Pulses: Normal pulses.     Heart sounds: Normal heart sounds. No murmur heard.    No friction rub. No gallop.  Pulmonary:     Effort: Pulmonary effort is normal. No respiratory distress.     Breath sounds: Normal breath sounds. No stridor. No wheezing, rhonchi or rales.  Chest:     Chest wall: No tenderness.  Abdominal:     General: There is no distension.     Palpations: Abdomen is soft.     Tenderness: There is no abdominal tenderness. There is no right CVA tenderness, left CVA tenderness or guarding.  Musculoskeletal:        General: No swelling, tenderness, deformity or signs of injury.     Right lower leg: No edema.     Left lower leg: No edema.  Skin:    General: Skin is warm and dry.     Coloration: Skin is not jaundiced or pale.  Neurological:     Mental Status: She is alert and oriented to person, place, and time.     Motor: No weakness.     Coordination: Coordination normal.     Gait: Gait normal.  Psychiatric:        Mood and Affect: Mood normal.        Behavior: Behavior normal.        Thought Content: Thought content normal.        Judgment: Judgment normal.     BP (!) 173/47   Pulse 66   Temp 97.7 F (36.5 C)   Ht 5' 2"$  (1.575 m)   Wt 149 lb 6.4 oz (67.8 kg)   SpO2 100%   BMI 27.33 kg/m  Wt Readings from  Last 3 Encounters:  07/14/22 149 lb 6.4 oz (67.8 kg)  06/26/22 144 lb 9.6 oz (65.6 kg)  06/22/22 143 lb 11.8 oz (65.2  kg)    Lab Results  Component Value Date   TSH 2.67 06/26/2022   Lab Results  Component Value Date   WBC 8.5 06/23/2022   HGB 10.5 (L) 06/23/2022   HCT 31.6 (L) 06/23/2022   MCV 92.4 06/23/2022   PLT 133 (L) 06/23/2022   Lab Results  Component Value Date   NA 134 (L) 06/23/2022   K 4.8 06/23/2022   CO2 29 06/23/2022   GLUCOSE 84 06/23/2022   BUN 35 (H) 06/23/2022   CREATININE 5.99 (H) 06/23/2022   BILITOT 0.2 (L) 06/22/2022   ALKPHOS 82 06/22/2022   AST 18 06/22/2022   ALT 10 06/22/2022   PROT 6.6 06/22/2022   ALBUMIN 3.7 06/22/2022   CALCIUM 8.1 (L) 06/23/2022   ANIONGAP 10 06/23/2022   Lab Results  Component Value Date   CHOL 252 (H) 04/06/2019   Lab Results  Component Value Date   HDL 53 04/06/2019   Lab Results  Component Value Date   LDLCALC 152 (H) 04/06/2019   Lab Results  Component Value Date   TRIG 257 (H) 04/06/2019   Lab Results  Component Value Date   CHOLHDL 4.8 (H) 04/06/2019   Lab Results  Component Value Date   HGBA1C 4.5 (L) 09/21/2021      Assessment & Plan:   Problem List Items Addressed This Visit       Cardiovascular and Mediastinum   Essential hypertension    BP Readings from Last 3 Encounters:  07/14/22 (!) 173/47  06/26/22 123/65  06/23/22 (!) 177/65   She is currently on carvedilol 12.5 mg twice daily, isosorbide-hydralazine 20-37.51 tablet 3 times daily, amlodipine 10 mg daily. Need to take all medications daily as ordered were discussed with the patient DASH diet advised engage in regular daily walking exercises as tolerated        Respiratory   Acute pulmonary edema ([place])    Now resolved,patient denies shortness of breath, cough, wheezing, orthopnea        Digestive   Enterocolitis    She was on admission at the hospital 06/22/2022 Patient currently denies abdominal pain, fever, chills, vomiting. Has some nausea, she was encouraged to take zofran 39m every 8 hours as needed.         Endocrine   Type 2  diabetes mellitus without obesity ([place] - [facility]    Currently not on medications Will check A1c today Avoid sugar sweets soda      Relevant Orders   Lipid Panel   Hemoglobin A1c     Genitourinary   ESRD (end stage renal disease) on dialysis (HCC)    SHe has been attending dialysis regularly on Mondays Wednesdays Fridays. Patient encouraged to maintain close follow-up with nephrologist        Other   Mixed hyperlipidemia    Checking lipid panel today . Currently not on a statin       Nonintractable headache    Reports that her HA is better , Has topomax 240mdaily ordered but her daughter stated that she has not been taking the medication daily , she was encouraged to take med daily as ordered and follow up with neurology       Full incontinence of feces    Referral placed to GI about 3 months ago , GI was unable to reach the  patient, referral placed again today.        Other Visit Diagnoses     Incontinence of feces, unspecified fecal incontinence type       Relevant Orders   Ambulatory referral to Gastroenterology       No orders of the defined types were placed in this encounter.   Follow-up: Return in about 4 months (around 11/12/2022) for DM/HTN.    Renee Rival, FNP

## 2022-07-14 NOTE — Assessment & Plan Note (Signed)
Checking lipid panel today . Currently not on a statin

## 2022-07-15 DIAGNOSIS — D631 Anemia in chronic kidney disease: Secondary | ICD-10-CM | POA: Diagnosis not present

## 2022-07-15 DIAGNOSIS — Z992 Dependence on renal dialysis: Secondary | ICD-10-CM | POA: Diagnosis not present

## 2022-07-15 DIAGNOSIS — E1122 Type 2 diabetes mellitus with diabetic chronic kidney disease: Secondary | ICD-10-CM | POA: Diagnosis not present

## 2022-07-15 DIAGNOSIS — N2581 Secondary hyperparathyroidism of renal origin: Secondary | ICD-10-CM | POA: Diagnosis not present

## 2022-07-15 DIAGNOSIS — N186 End stage renal disease: Secondary | ICD-10-CM | POA: Diagnosis not present

## 2022-07-15 DIAGNOSIS — D509 Iron deficiency anemia, unspecified: Secondary | ICD-10-CM | POA: Diagnosis not present

## 2022-07-15 LAB — HEMOGLOBIN A1C
Est. average glucose Bld gHb Est-mCnc: 97 mg/dL
Hgb A1c MFr Bld: 5 % (ref 4.8–5.6)

## 2022-07-15 LAB — LIPID PANEL
Chol/HDL Ratio: 3.1 ratio (ref 0.0–4.4)
Cholesterol, Total: 164 mg/dL (ref 100–199)
HDL: 53 mg/dL (ref >39)
LDL Chol Calc (NIH): 90 mg/dL (ref 0–99)
Triglycerides: 115 mg/dL (ref 0–149)
VLDL Cholesterol Cal: 21 mg/dL (ref 5–40)

## 2022-07-15 NOTE — Progress Notes (Signed)
Stable labs, continue current medications , does not need medication for diabetes as her A1c remains very good.

## 2022-07-17 DIAGNOSIS — Z992 Dependence on renal dialysis: Secondary | ICD-10-CM | POA: Diagnosis not present

## 2022-07-17 DIAGNOSIS — D509 Iron deficiency anemia, unspecified: Secondary | ICD-10-CM | POA: Diagnosis not present

## 2022-07-17 DIAGNOSIS — N186 End stage renal disease: Secondary | ICD-10-CM | POA: Diagnosis not present

## 2022-07-17 DIAGNOSIS — D631 Anemia in chronic kidney disease: Secondary | ICD-10-CM | POA: Diagnosis not present

## 2022-07-17 DIAGNOSIS — E1122 Type 2 diabetes mellitus with diabetic chronic kidney disease: Secondary | ICD-10-CM | POA: Diagnosis not present

## 2022-07-17 DIAGNOSIS — N2581 Secondary hyperparathyroidism of renal origin: Secondary | ICD-10-CM | POA: Diagnosis not present

## 2022-07-20 DIAGNOSIS — Z992 Dependence on renal dialysis: Secondary | ICD-10-CM | POA: Diagnosis not present

## 2022-07-20 DIAGNOSIS — D509 Iron deficiency anemia, unspecified: Secondary | ICD-10-CM | POA: Diagnosis not present

## 2022-07-20 DIAGNOSIS — E1122 Type 2 diabetes mellitus with diabetic chronic kidney disease: Secondary | ICD-10-CM | POA: Diagnosis not present

## 2022-07-20 DIAGNOSIS — N186 End stage renal disease: Secondary | ICD-10-CM | POA: Diagnosis not present

## 2022-07-20 DIAGNOSIS — N2581 Secondary hyperparathyroidism of renal origin: Secondary | ICD-10-CM | POA: Diagnosis not present

## 2022-07-20 DIAGNOSIS — D631 Anemia in chronic kidney disease: Secondary | ICD-10-CM | POA: Diagnosis not present

## 2022-07-21 DIAGNOSIS — E113591 Type 2 diabetes mellitus with proliferative diabetic retinopathy without macular edema, right eye: Secondary | ICD-10-CM | POA: Diagnosis not present

## 2022-07-21 NOTE — Progress Notes (Deleted)
NEUROLOGY FOLLOW UP OFFICE NOTE  Rachael Burke HC:[id number]  Subjective:  Rachael Burke is a 67 y.o. year old female with a history of  DM, HLD, HTN, ESRD (on dialysis), left sided hearing loss who we last saw on 06/26/22.  To briefly review: Patient has had symptoms since 11/2018. Patient got really sick (nausea, vomiting, diarrhea, dizzy, headache, and neck pain) in [place]. The doctors there were talking about doing an exploratory surgery, but patient did not do this. This lasted about 3 months. Her nausea, vomiting, and diarrhea all improved but the dizziness and headache did not improve.   Currently patient currently has severe headaches. It is in the neck and front of her head. It is a pressure sensation. It is about 5/10. Her headaches are associated with neck pain, photophobia, phonophobia, and nausea. Her headache typically lasts hours. If she takes tylenol, the headache can resolve in 1 hour. She takes  tylenol 1000 mg twice per day when she has a headache. She typically has headaches after dialysis (3 times per week). On other days, she does not have significant headaches. Triggers include being around a lot of people and watching TV. Going to lay down in cold dark room can help. She has never been given a medication for headaches.   She has dizziness that she was previously told was vertigo prior but no cause was found. The symptoms come and go. She describes the vertigo as spinning and not feeling like herself. Daughter sees her walking off balance around the house. She has not fallen. She was given Nootropil in [place]. This helps.   Of note, patient was seen at the ED on 03/02/22 after dialysis for AMS. She was found to be bradycardic, with hyperkalemic, and elevated BP (170s/90s). CT head at that time was unremarkable.   Patient takes gabapentin for neuropathy.  06/26/22: Patient continues to have headaches and walks like a "drunk person."   Patient went to PT  which helped with some neck pain. Her headaches improved when her neck felt better. Some days she has no pain and others a lot of pain. Her dizziness did not improve. Due to dizziness, there was concern from patient and daughter that she needs an MRI.   Her blood pressure has improved which has also improved her headaches. Her walking has not improved. She continues to walk from side to side. Moving her neck makes her head feel like it is spinning. She wants to close her eyes when this happens.  Patient established with a PCP about 2 weeks ago. She sees a nephrologist at dialysis.  Most recent Assessment and Plan (06/26/22): This is Rachael Burke, a 67 y.o. female with headaches, dizziness, and neck pain. As suspected, patient's headache improved with PT for neck and with improved BP. She still has significant headaches though. Her dizziness and imbalance have not improved. The etiology of her dizziness is still unclear. I still believe her neck could be contributing, but has not improved with PT. MRI of brain and cervical spine is reasonable to work this up further. I want to treat her symptoms, however the options are limited given her other medications and being on dialysis. I will try the options below at very low doses, but was careful to explain to patient to monitor for potential side effects.   Plan: -Blood work: TSH, Copper, Vit E -MRI brain and cervical spine wo contrast -Topamax 25 mg daily for headaches -Meclizine 12.5 mg  PRN for dizziness - will stay very low due to ESRD  Since their last visit: TSH, copper, and vit E were within normal limits. MRI brain was unremarkable. MRI cervical spine showed only mild spinal stenosis without cord signal change and moderate to severe right C6 foraminal stenosis.  Taking topamax?***  Is meclizine helping?***  MEDICATIONS:  Outpatient Encounter Medications as of 07/29/2022  Medication Sig Note   acetaminophen (TYLENOL) 325 MG tablet  Take 2 tablets (650 mg total) by mouth every 6 (six) hours as needed for mild pain or moderate pain.    amLODipine (NORVASC) 10 MG tablet Take 1 tablet by mouth daily.    calcium acetate (PHOSLO) 667 MG capsule Take 667 mg by mouth 3 (three) times daily.    carvedilol (COREG) 12.5 MG tablet Take 1 tablet (12.5 mg total) by mouth 2 (two) times daily.    gabapentin (NEURONTIN) 100 MG capsule Take 300 mg by mouth at bedtime.    isosorbide-hydrALAZINE (BIDIL) 20-37.5 MG tablet Take 1 tablet by mouth 3 (three) times daily.    meclizine (ANTIVERT) 12.5 MG tablet Take 1 tablet (12.5 mg total) by mouth 2 (two) times daily as needed for dizziness.    nitroGLYCERIN (NITROSTAT) 0.4 MG SL tablet Place 1 tablet (0.4 mg total) under the tongue every 5 (five) minutes as needed for chest pain. 07/14/2022: PRN    olopatadine (PATANOL) 0.1 % ophthalmic solution Place 1 drop into both eyes 2 (two) times daily as needed for allergies. (Patient not taking: Reported on 07/14/2022)    omeprazole (PRILOSEC OTC) 20 MG tablet Take 20 mg by mouth daily as needed (indigestion).    ondansetron (ZOFRAN-ODT) 4 MG disintegrating tablet Take 1 tablet (4 mg total) by mouth every 8 (eight) hours as needed for nausea or vomiting.    polyethylene glycol (MIRALAX / GLYCOLAX) 17 g packet Take 17 g by mouth daily.    topiramate (TOPAMAX) 25 MG tablet Take 1 tablet (25 mg total) by mouth daily. (Patient not taking: Reported on 07/14/2022)    No facility-administered encounter medications on file as of 07/29/2022.    PAST MEDICAL HISTORY: Past Medical History:  Diagnosis Date   Anemia    Anxiety    Arthritis    Diabetes mellitus without complication (HCC)    type 2   Heart murmur    echo 07/02/20: Mild MR/TR, mild-moderate AV sclerosis, bicuspid or functional bicuspid AV, no evidence of AS. Murmr felt due to AV.   History of blood transfusion    Hyperlipidemia    Hypertension    Pneumonia    PONV (postoperative nausea and vomiting)     Renal disorder    M-W-F    PAST SURGICAL HISTORY: Past Surgical History:  Procedure Laterality Date   AV FISTULA PLACEMENT Left 05/20/2020   Procedure: INSERTION OF LEFT ARM ARTERIOVENOUS (AV) FISTULA;  Surgeon: Marty Heck, MD;  Location: [place];  Service: Vascular;  Laterality: Left;   [place] Left 08/05/2020   Procedure: SECOND STAGE BASILIC VEIN TRANSPOSITION;  Surgeon: Marty Heck, MD;  Location: [place];  Service: Vascular;  Laterality: Left;   BIOPSY  09/22/2021   Procedure: BIOPSY;  Surgeon: Ladene Artist, MD;  Location: [facility] ENDOSCOPY;  Service: Gastroenterology;;   BLADDER REPAIR     nov 2023   CESAREAN SECTION     CHOLECYSTECTOMY     ESOPHAGOGASTRODUODENOSCOPY (EGD) WITH PROPOFOL N/A 09/22/2021   Procedure: ESOPHAGOGASTRODUODENOSCOPY (EGD) WITH PROPOFOL;  Surgeon: Ladene Artist,  MD;  Location: [place];  Service: Gastroenterology;  Laterality: N/A;   INSERTION OF DIALYSIS CATHETER N/A 05/20/2020   Procedure: INSERTION OF DIALYSIS CATHETER;  Surgeon: Marty Heck, MD;  Location: [place];  Service: Vascular;  Laterality: N/A;   VAGINAL HYSTERECTOMY     nov 2023   VIDEO BRONCHOSCOPY Bilateral 09/26/2021   Procedure: VIDEO BRONCHOSCOPY WITHOUT FLUORO;  Surgeon: Candee Furbish, MD;  Location: [facility] ENDOSCOPY;  Service: Pulmonary;  Laterality: Bilateral;    ALLERGIES: Allergies  Allergen Reactions   Ceftriaxone     FAMILY HISTORY: Family History  Problem Relation Age of Onset   Diabetes Mother    Hypertension Father    Diabetes Sister    Diabetes Brother    Diabetes Brother     SOCIAL HISTORY: Social History   Tobacco Use   Smoking status: Never   Smokeless tobacco: Never  Vaping Use   Vaping Use: Never used  Substance Use Topics   Alcohol use: Never   Drug use: Never   Social History   Social History Narrative   Right handed   Caffeine 1/2 cup daily   One story home   Lives with daughter   retired       Objective:  Vital Signs:  There were no vitals taken for this visit.  ***  Labs and Imaging review: New results: HbA1c (07/14/22): 5.0  06/26/22: Copper: 97 Vit E: 11.5 TSH: 2.67  MRI brain wo contrast (07/12/22): FINDINGS: Brain: There is no evidence of an acute infarct, intracranial hemorrhage, mass, midline shift, or extra-axial fluid collection. The ventricles and sulci are within normal limits for age. T2 hyperintensities in the cerebral white matter bilaterally are nonspecific but compatible with mild chronic small vessel ischemic disease.   Vascular: Major intracranial vascular flow voids are preserved.   Skull and upper cervical spine: Unremarkable bone marrow signal.   Sinuses/Orbits: Bilateral cataract extraction. Mild mucosal thickening in the paranasal sinuses. No significant mastoid fluid.   Other: None.   IMPRESSION: 1. No acute intracranial abnormality. 2. Mild chronic small vessel ischemic disease.  MRI cervical spine wo contrast (07/12/22): FINDINGS: Alignment: Straightening of the normal cervical lordosis. No listhesis.   Vertebrae: Vertebral body height well maintained without acute or chronic fracture. Bone marrow signal intensity within normal limits. No discrete or worrisome osseous lesions. No abnormal marrow edema.   Cord: Normal signal and morphology.   Posterior Fossa, vertebral arteries, paraspinal tissues: Partially empty sella noted. Visualized brain and posterior fossa otherwise unremarkable. Craniocervical junction normal. Paraspinous soft tissues within normal limits. Normal flow voids seen within the vertebral arteries bilaterally.   Disc levels:   C2-C3: Normal interspace. Mild bilateral facet spurring. No spinal stenosis. Foramina remain patent.   C3-C4: Broad-based posterior disc bulge flattens and effaces the ventral thecal sac (series 6, image 9). Secondary cord flattening without cord signal changes. Superimposed  mild ligament flavum hypertrophy. Resultant mild spinal stenosis. Foramina remain patent.   C4-C5: Right paracentral disc protrusion contacts the right ventral cord (series 6, image 13). Secondary cord flattening without cord signal changes. Mild spinal stenosis. Foramina remain adequately patent.   C5-C6: Mild degenerative intervertebral disc space narrowing with diffuse disc osteophyte complex. Associated right worse than left uncovertebral spurring. Flattening of the ventral thecal sac without significant spinal stenosis. Moderate to severe right C6 foraminal narrowing. Left neural foramina remains patent.   C6-C7: Mild disc bulge with uncovertebral spurring. Mild left-sided facet hypertrophy. No spinal stenosis. Mild left C7 foraminal narrowing.  Right neural foramina remains patent.   C7-T1: Normal interspace. Mild left-sided facet hypertrophy. No canal or foraminal stenosis.   IMPRESSION: 1. Broad-based posterior disc bulge at C3-4 with resultant mild spinal stenosis. 2. Right paracentral disc protrusion at C4-5 with secondary cord flattening and mild spinal stenosis, but no cord signal changes. 3. Moderate to severe right C6 foraminal stenosis related to disc osteophyte and uncovertebral disease. 4. Mild left C7 foraminal stenosis related to disc bulge and uncovertebral disease.  Previously reviewed results: 04/19/22: CMP significant for hyperglycemia (141), elevated Cr 4.65 CBC significant for anemia (chronic, Hb 11.4)  Recent Labs           Lab Results  Component Value Date    VITAMINB12 1,252 (H) 04/07/2019       Imaging: CT head wo contrast (03/02/22): FINDINGS: Brain: No evidence of acute infarction, hemorrhage, hydrocephalus, extra-axial collection or mass lesion/mass effect. Mild generalized parenchymal volume loss, within normal range for patient age. Scattered hypodensities in the periventricular and subcortical white matter, consistent with chronic  small-vessel ischemic changes.   Vascular: Vascular calcifications at the skull base. No hyperdense vessel or unexpected calcification.   Skull: Normal. Negative for fracture or focal lesion.   Sinuses/Orbits: No acute finding. Trace mucosal thickening in a posterior left ethmoid air cell. The other paranasal sinuses and mastoid air cells are clear.   Other: None.   IMPRESSION: No acute intracranial abnormality.   CT maxillofacial wo contrast (10/07/21): FINDINGS: Osseous: Negative for fracture, erosion, or malalignment at the temporomandibular joints. No evidence of degeneration of the TMJ. Multiple missing teeth with dental appliance that obscures remaining teeth, many of which have undergone repair.   Orbits: No evidence of inflammation or mass   Sinuses: Essentially clear, only trace mucosal thickening seen along the right maxillary floor   Soft tissues: Negative   Limited intracranial: Negative   IMPRESSION: Normal appearance of the temporomandibular joints.  Assessment/Plan:  This is Rachael Burke, a 67 y.o. female with headaches, dizziness, and neck pain. ***  Plan: ***  Return to clinic in ***  Total time spent reviewing records, interview, history/exam, documentation, and coordination of care on day of encounter:  *** min  Kai Levins, MD

## 2022-07-22 DIAGNOSIS — D509 Iron deficiency anemia, unspecified: Secondary | ICD-10-CM | POA: Diagnosis not present

## 2022-07-22 DIAGNOSIS — N186 End stage renal disease: Secondary | ICD-10-CM | POA: Diagnosis not present

## 2022-07-22 DIAGNOSIS — D631 Anemia in chronic kidney disease: Secondary | ICD-10-CM | POA: Diagnosis not present

## 2022-07-22 DIAGNOSIS — N2581 Secondary hyperparathyroidism of renal origin: Secondary | ICD-10-CM | POA: Diagnosis not present

## 2022-07-22 DIAGNOSIS — E1122 Type 2 diabetes mellitus with diabetic chronic kidney disease: Secondary | ICD-10-CM | POA: Diagnosis not present

## 2022-07-22 DIAGNOSIS — Z992 Dependence on renal dialysis: Secondary | ICD-10-CM | POA: Diagnosis not present

## 2022-07-23 DIAGNOSIS — M542 Cervicalgia: Secondary | ICD-10-CM | POA: Diagnosis not present

## 2022-07-23 DIAGNOSIS — M6281 Muscle weakness (generalized): Secondary | ICD-10-CM | POA: Diagnosis not present

## 2022-07-24 DIAGNOSIS — E1122 Type 2 diabetes mellitus with diabetic chronic kidney disease: Secondary | ICD-10-CM | POA: Diagnosis not present

## 2022-07-24 DIAGNOSIS — Z992 Dependence on renal dialysis: Secondary | ICD-10-CM | POA: Diagnosis not present

## 2022-07-24 DIAGNOSIS — D509 Iron deficiency anemia, unspecified: Secondary | ICD-10-CM | POA: Diagnosis not present

## 2022-07-24 DIAGNOSIS — D631 Anemia in chronic kidney disease: Secondary | ICD-10-CM | POA: Diagnosis not present

## 2022-07-24 DIAGNOSIS — N2581 Secondary hyperparathyroidism of renal origin: Secondary | ICD-10-CM | POA: Diagnosis not present

## 2022-07-24 DIAGNOSIS — N186 End stage renal disease: Secondary | ICD-10-CM | POA: Diagnosis not present

## 2022-07-27 DIAGNOSIS — N186 End stage renal disease: Secondary | ICD-10-CM | POA: Diagnosis not present

## 2022-07-27 DIAGNOSIS — D631 Anemia in chronic kidney disease: Secondary | ICD-10-CM | POA: Diagnosis not present

## 2022-07-27 DIAGNOSIS — E1122 Type 2 diabetes mellitus with diabetic chronic kidney disease: Secondary | ICD-10-CM | POA: Diagnosis not present

## 2022-07-27 DIAGNOSIS — N2581 Secondary hyperparathyroidism of renal origin: Secondary | ICD-10-CM | POA: Diagnosis not present

## 2022-07-27 DIAGNOSIS — Z992 Dependence on renal dialysis: Secondary | ICD-10-CM | POA: Diagnosis not present

## 2022-07-27 DIAGNOSIS — D509 Iron deficiency anemia, unspecified: Secondary | ICD-10-CM | POA: Diagnosis not present

## 2022-07-28 DIAGNOSIS — M6281 Muscle weakness (generalized): Secondary | ICD-10-CM | POA: Diagnosis not present

## 2022-07-28 DIAGNOSIS — M542 Cervicalgia: Secondary | ICD-10-CM | POA: Diagnosis not present

## 2022-07-29 ENCOUNTER — Encounter: Payer: Self-pay | Admitting: Neurology

## 2022-07-29 ENCOUNTER — Ambulatory Visit: Payer: 59 | Admitting: Neurology

## 2022-07-29 DIAGNOSIS — D631 Anemia in chronic kidney disease: Secondary | ICD-10-CM | POA: Diagnosis not present

## 2022-07-29 DIAGNOSIS — N2581 Secondary hyperparathyroidism of renal origin: Secondary | ICD-10-CM | POA: Diagnosis not present

## 2022-07-29 DIAGNOSIS — Z992 Dependence on renal dialysis: Secondary | ICD-10-CM | POA: Diagnosis not present

## 2022-07-29 DIAGNOSIS — D509 Iron deficiency anemia, unspecified: Secondary | ICD-10-CM | POA: Diagnosis not present

## 2022-07-29 DIAGNOSIS — N186 End stage renal disease: Secondary | ICD-10-CM | POA: Diagnosis not present

## 2022-07-29 DIAGNOSIS — E1122 Type 2 diabetes mellitus with diabetic chronic kidney disease: Secondary | ICD-10-CM | POA: Diagnosis not present

## 2022-07-29 NOTE — Progress Notes (Unsigned)
NEUROLOGY FOLLOW UP OFFICE NOTE  Rachael Burke HC:[id number]  Subjective:  Rachael Burke is a 67 y.o. year old female with a history of  DM, HLD, HTN, ESRD (on dialysis), left sided hearing loss who we last saw on 06/26/22.   To briefly review: Patient has had symptoms since 11/2018. Patient got really sick (nausea, vomiting, diarrhea, dizzy, headache, and neck pain) in [place]. The doctors there were talking about doing an exploratory surgery, but patient did not do this. This lasted about 3 months. Her nausea, vomiting, and diarrhea all improved but the dizziness and headache did not improve.   Currently patient currently has severe headaches. It is in the neck and front of her head. It is a pressure sensation. It is about 5/10. Her headaches are associated with neck pain, photophobia, phonophobia, and nausea. Her headache typically lasts hours. If she takes tylenol, the headache can resolve in 1 hour. She takes  tylenol 1000 mg twice per day when she has a headache. She typically has headaches after dialysis (3 times per week). On other days, she does not have significant headaches. Triggers include being around a lot of people and watching TV. Going to lay down in cold dark room can help. She has never been given a medication for headaches.   She has dizziness that she was previously told was vertigo prior but no cause was found. The symptoms come and go. She describes the vertigo as spinning and not feeling like herself. Daughter sees her walking off balance around the house. She has not fallen. She was given Nootropil in [place]. This helps.   Of note, patient was seen at the ED on 03/02/22 after dialysis for AMS. She was found to be bradycardic, with hyperkalemic, and elevated BP (170s/90s). CT head at that time was unremarkable.   Patient takes gabapentin for neuropathy.   06/26/22: Patient continues to have headaches and walks like a "drunk person."   Patient went to PT  which helped with some neck pain. Her headaches improved when her neck felt better. Some days she has no pain and others a lot of pain. Her dizziness did not improve. Due to dizziness, there was concern from patient and daughter that she needs an MRI.   Her blood pressure has improved which has also improved her headaches. Her walking has not improved. She continues to walk from side to side. Moving her neck makes her head feel like it is spinning. She wants to close her eyes when this happens.  Patient established with a PCP about 2 weeks ago. She sees a nephrologist at dialysis.    Most recent Assessment and Plan (06/26/22): This is Rachael Burke, a 67 y.o. female with headaches, dizziness, and neck pain. As suspected, patient's headache improved with PT for neck and with improved BP. She still has significant headaches though. Her dizziness and imbalance have not improved. The etiology of her dizziness is still unclear. I still believe her neck could be contributing, but has not improved with PT. MRI of brain and cervical spine is reasonable to work this up further. I want to treat her symptoms, however the options are limited given her other medications and being on dialysis. I will try the options below at very low doses, but was careful to explain to patient to monitor for potential side effects.   Plan: -Blood work: TSH, Copper, Vit E -MRI brain and cervical spine wo contrast -Topamax 25 mg daily for  headaches -Meclizine 12.5 mg PRN for dizziness - will stay very low due to ESRD  Since their last visit: TSH, copper, and vit E were within normal limits. MRI brain was unremarkable. MRI cervical spine showed only mild spinal stenosis without cord signal change and moderate to severe right C6 foraminal stenosis.  Patient is taking meclizine which will take away the dizziness.   Patient is not currently having headaches. She is taking Topamax only as needed currently. She does not  really think she needs it. She only gets headache after dialysis sometimes.   MEDICATIONS:  Outpatient Encounter Medications as of 07/30/2022  Medication Sig Note   acetaminophen (TYLENOL) 325 MG tablet Take 2 tablets (650 mg total) by mouth every 6 (six) hours as needed for mild pain or moderate pain.    amLODipine (NORVASC) 10 MG tablet Take 1 tablet by mouth daily.    calcium acetate (PHOSLO) 667 MG capsule Take 667 mg by mouth 3 (three) times daily.    carvedilol (COREG) 12.5 MG tablet Take 1 tablet (12.5 mg total) by mouth 2 (two) times daily.    gabapentin (NEURONTIN) 100 MG capsule Take 300 mg by mouth at bedtime.    isosorbide-hydrALAZINE (BIDIL) 20-37.5 MG tablet Take 1 tablet by mouth 3 (three) times daily.    meclizine (ANTIVERT) 12.5 MG tablet Take 1 tablet (12.5 mg total) by mouth 2 (two) times daily as needed for dizziness.    nitroGLYCERIN (NITROSTAT) 0.4 MG SL tablet Place 1 tablet (0.4 mg total) under the tongue every 5 (five) minutes as needed for chest pain. 07/14/2022: PRN    omeprazole (PRILOSEC OTC) 20 MG tablet Take 20 mg by mouth daily as needed (indigestion).    ondansetron (ZOFRAN-ODT) 4 MG disintegrating tablet Take 1 tablet (4 mg total) by mouth every 8 (eight) hours as needed for nausea or vomiting.    polyethylene glycol (MIRALAX / GLYCOLAX) 17 g packet Take 17 g by mouth daily.    olopatadine (PATANOL) 0.1 % ophthalmic solution Place 1 drop into both eyes 2 (two) times daily as needed for allergies. (Patient not taking: Reported on 07/14/2022)    [DISCONTINUED] topiramate (TOPAMAX) 25 MG tablet Take 1 tablet (25 mg total) by mouth daily. (Patient not taking: Reported on 07/14/2022)    No facility-administered encounter medications on file as of 07/30/2022.    PAST MEDICAL HISTORY: Past Medical History:  Diagnosis Date   Anemia    Anxiety    Arthritis    Diabetes mellitus without complication (HCC)    type 2   Heart murmur    echo 07/02/20: Mild MR/TR,  mild-moderate AV sclerosis, bicuspid or functional bicuspid AV, no evidence of AS. Murmr felt due to AV.   History of blood transfusion    Hyperlipidemia    Hypertension    Pneumonia    PONV (postoperative nausea and vomiting)    Renal disorder    M-W-F    PAST SURGICAL HISTORY: Past Surgical History:  Procedure Laterality Date   AV FISTULA PLACEMENT Left 05/20/2020   Procedure: INSERTION OF LEFT ARM ARTERIOVENOUS (AV) FISTULA;  Surgeon: Marty Heck, MD;  Location: [facility];  Service: Vascular;  Laterality: Left;   BASCILIC VEIN TRANSPOSITION Left 08/05/2020   Procedure: SECOND STAGE BASILIC VEIN TRANSPOSITION;  Surgeon: Marty Heck, MD;  Location: [facility];  Service: Vascular;  Laterality: Left;   BIOPSY  09/22/2021   Procedure: BIOPSY;  Surgeon: Ladene Artist, MD;  Location: [facility];  Service: Gastroenterology;;  BLADDER REPAIR     nov 2023   CESAREAN SECTION     CHOLECYSTECTOMY     ESOPHAGOGASTRODUODENOSCOPY (EGD) WITH PROPOFOL N/A 09/22/2021   Procedure: ESOPHAGOGASTRODUODENOSCOPY (EGD) WITH PROPOFOL;  Surgeon: Ladene Artist, MD;  Location: [facility];  Service: Gastroenterology;  Laterality: N/A;   INSERTION OF DIALYSIS CATHETER N/A 05/20/2020   Procedure: INSERTION OF DIALYSIS CATHETER;  Surgeon: Marty Heck, MD;  Location: [facility];  Service: Vascular;  Laterality: N/A;   VAGINAL HYSTERECTOMY     nov 2023   VIDEO BRONCHOSCOPY Bilateral 09/26/2021   Procedure: VIDEO BRONCHOSCOPY WITHOUT FLUORO;  Surgeon: Candee Furbish, MD;  Location: [facility];  Service: Pulmonary;  Laterality: Bilateral;    ALLERGIES: Allergies  Allergen Reactions   Ceftriaxone     FAMILY HISTORY: Family History  Problem Relation Age of Onset   Diabetes Mother    Hypertension Father    Diabetes Sister    Diabetes Brother    Diabetes Brother     SOCIAL HISTORY: Social History   Tobacco Use   Smoking status: Never   Smokeless tobacco: Never  Vaping Use    Vaping Use: Never used  Substance Use Topics   Alcohol use: Never   Drug use: Never   Social History   Social History Narrative   Right handed   Caffeine 1/2 cup daily   One story home   Lives with daughter   retired      Objective:  Vital Signs:  BP 135/67   Pulse 65   Ht 5' (1.524 m)   Wt 146 lb 12.8 oz (66.6 kg)   SpO2 98%   BMI 28.67 kg/m   General: No acute distress.  Patient appears well-groomed.   Head:  Normocephalic/atraumatic.  Eyes:  Fundi examined but not visualized Neck: supple, no paraspinal tenderness, Reduced range of motion of neck Neurological Exam: alert and oriented to person, place, and time.  Speech fluent and not dysarthric, language intact.  CN II-XII intact. Bulk and tone normal, muscle strength 5/5 throughout.  Sensation to light touch intact.  Deep tendon reflexes 2+ throughout, toes downgoing.  Finger to nose testing intact.  Gait normal, balance improved from prior.  Labs and Imaging review: New results: HbA1c (07/14/22): 5.0   06/26/22: Copper: 97 Vit E: 11.5 TSH: 2.67   MRI brain wo contrast (07/12/22): FINDINGS: Brain: There is no evidence of an acute infarct, intracranial hemorrhage, mass, midline shift, or extra-axial fluid collection. The ventricles and sulci are within normal limits for age. T2 hyperintensities in the cerebral white matter bilaterally are nonspecific but compatible with mild chronic small vessel ischemic disease.   Vascular: Major intracranial vascular flow voids are preserved.   Skull and upper cervical spine: Unremarkable bone marrow signal.   Sinuses/Orbits: Bilateral cataract extraction. Mild mucosal thickening in the paranasal sinuses. No significant mastoid fluid.   Other: None.   IMPRESSION: 1. No acute intracranial abnormality. 2. Mild chronic small vessel ischemic disease.   MRI cervical spine wo contrast (07/12/22): FINDINGS: Alignment: Straightening of the normal cervical lordosis.  No listhesis.   Vertebrae: Vertebral body height well maintained without acute or chronic fracture. Bone marrow signal intensity within normal limits. No discrete or worrisome osseous lesions. No abnormal marrow edema.   Cord: Normal signal and morphology.   Posterior Fossa, vertebral arteries, paraspinal tissues: Partially empty sella noted. Visualized brain and posterior fossa otherwise unremarkable. Craniocervical junction normal. Paraspinous soft tissues within normal limits. Normal flow voids seen within the  vertebral arteries bilaterally.   Disc levels:   C2-C3: Normal interspace. Mild bilateral facet spurring. No spinal stenosis. Foramina remain patent.   C3-C4: Broad-based posterior disc bulge flattens and effaces the ventral thecal sac (series 6, image 9). Secondary cord flattening without cord signal changes. Superimposed mild ligament flavum hypertrophy. Resultant mild spinal stenosis. Foramina remain patent.   C4-C5: Right paracentral disc protrusion contacts the right ventral cord (series 6, image 13). Secondary cord flattening without cord signal changes. Mild spinal stenosis. Foramina remain adequately patent.   C5-C6: Mild degenerative intervertebral disc space narrowing with diffuse disc osteophyte complex. Associated right worse than left uncovertebral spurring. Flattening of the ventral thecal sac without significant spinal stenosis. Moderate to severe right C6 foraminal narrowing. Left neural foramina remains patent.   C6-C7: Mild disc bulge with uncovertebral spurring. Mild left-sided facet hypertrophy. No spinal stenosis. Mild left C7 foraminal narrowing. Right neural foramina remains patent.   C7-T1: Normal interspace. Mild left-sided facet hypertrophy. No canal or foraminal stenosis.   IMPRESSION: 1. Broad-based posterior disc bulge at C3-4 with resultant mild spinal stenosis. 2. Right paracentral disc protrusion at C4-5 with secondary  cord flattening and mild spinal stenosis, but no cord signal changes. 3. Moderate to severe right C6 foraminal stenosis related to disc osteophyte and uncovertebral disease. 4. Mild left C7 foraminal stenosis related to disc bulge and uncovertebral disease.   Previously reviewed results: 04/19/22: CMP significant for hyperglycemia (141), elevated Cr 4.65 CBC significant for anemia (chronic, Hb 11.4)   Recent Labs           Lab Results  Component Value Date    VITAMINB12 1,252 (H) 04/07/2019       Imaging: CT head wo contrast (03/02/22): FINDINGS: Brain: No evidence of acute infarction, hemorrhage, hydrocephalus, extra-axial collection or mass lesion/mass effect. Mild generalized parenchymal volume loss, within normal range for patient age. Scattered hypodensities in the periventricular and subcortical white matter, consistent with chronic small-vessel ischemic changes.   Vascular: Vascular calcifications at the skull base. No hyperdense vessel or unexpected calcification.   Skull: Normal. Negative for fracture or focal lesion.   Sinuses/Orbits: No acute finding. Trace mucosal thickening in a posterior left ethmoid air cell. The other paranasal sinuses and mastoid air cells are clear.   Other: None.   IMPRESSION: No acute intracranial abnormality.   CT maxillofacial wo contrast (10/07/21): FINDINGS: Osseous: Negative for fracture, erosion, or malalignment at the temporomandibular joints. No evidence of degeneration of the TMJ. Multiple missing teeth with dental appliance that obscures remaining teeth, many of which have undergone repair.   Orbits: No evidence of inflammation or mass   Sinuses: Essentially clear, only trace mucosal thickening seen along the right maxillary floor   Soft tissues: Negative   Limited intracranial: Negative   IMPRESSION: Normal appearance of the temporomandibular joints.  Assessment/Plan:  This is Rachael Burke, a 67  y.o. female with headaches, dizziness, and neck pain. MRI of brain and cervical spine showed no central cause of her symptoms. Her neck pain and headaches have mostly resolved. She has dizziness still, but this is resolved with meclizine as needed. Her symptoms are most likely multifactorial with contributions from neck pain/tightness and side effects of dialysis/blood pressure control.  Plan: -Continue meclizine 12.5 mg PRN -Stop topamax -Continue home exercises given by PT  Return to clinic as needed  Total time spent reviewing records, interview, history/exam, documentation, and coordination of care on day of encounter:  20 min  Kai Levins, MD

## 2022-07-30 ENCOUNTER — Ambulatory Visit (INDEPENDENT_AMBULATORY_CARE_PROVIDER_SITE_OTHER): Payer: 59 | Admitting: Neurology

## 2022-07-30 ENCOUNTER — Encounter: Payer: Self-pay | Admitting: Neurology

## 2022-07-30 VITALS — BP 135/67 | HR 65 | Ht 60.0 in | Wt 146.8 lb

## 2022-07-30 DIAGNOSIS — R42 Dizziness and giddiness: Secondary | ICD-10-CM

## 2022-07-30 DIAGNOSIS — N186 End stage renal disease: Secondary | ICD-10-CM | POA: Diagnosis not present

## 2022-07-30 DIAGNOSIS — M542 Cervicalgia: Secondary | ICD-10-CM | POA: Diagnosis not present

## 2022-07-30 DIAGNOSIS — Z992 Dependence on renal dialysis: Secondary | ICD-10-CM | POA: Diagnosis not present

## 2022-07-30 DIAGNOSIS — R519 Headache, unspecified: Secondary | ICD-10-CM | POA: Diagnosis not present

## 2022-07-30 DIAGNOSIS — E1129 Type 2 diabetes mellitus with other diabetic kidney complication: Secondary | ICD-10-CM | POA: Diagnosis not present

## 2022-07-30 NOTE — Patient Instructions (Signed)
-  Continue meclizine 12.5 mg as needed for dizziness -Stop topamax as you are not having many headaches now -Continue home exercises given by physical therapy  Follow up with me as needed if you have worsening dizziness or headaches.  The physicians and staff at [facility] are committed to providing excellent care. You may receive a survey requesting feedback about your experience at our office. We strive to receive "very good" responses to the survey questions. If you feel that your experience would prevent you from giving the office a "very good " response, please contact our office to try to remedy the situation. We may be reached at [phone]. Thank you for taking the time out of your busy day to complete the survey.  Kai Levins, MD [facility]

## 2022-07-31 DIAGNOSIS — N186 End stage renal disease: Secondary | ICD-10-CM | POA: Diagnosis not present

## 2022-07-31 DIAGNOSIS — Z992 Dependence on renal dialysis: Secondary | ICD-10-CM | POA: Diagnosis not present

## 2022-07-31 DIAGNOSIS — R110 Nausea: Secondary | ICD-10-CM | POA: Diagnosis not present

## 2022-07-31 DIAGNOSIS — D509 Iron deficiency anemia, unspecified: Secondary | ICD-10-CM | POA: Diagnosis not present

## 2022-07-31 DIAGNOSIS — R197 Diarrhea, unspecified: Secondary | ICD-10-CM | POA: Diagnosis not present

## 2022-07-31 DIAGNOSIS — N2581 Secondary hyperparathyroidism of renal origin: Secondary | ICD-10-CM | POA: Diagnosis not present

## 2022-07-31 DIAGNOSIS — D631 Anemia in chronic kidney disease: Secondary | ICD-10-CM | POA: Diagnosis not present

## 2022-08-03 DIAGNOSIS — R110 Nausea: Secondary | ICD-10-CM | POA: Diagnosis not present

## 2022-08-03 DIAGNOSIS — D509 Iron deficiency anemia, unspecified: Secondary | ICD-10-CM | POA: Diagnosis not present

## 2022-08-03 DIAGNOSIS — R197 Diarrhea, unspecified: Secondary | ICD-10-CM | POA: Diagnosis not present

## 2022-08-03 DIAGNOSIS — D631 Anemia in chronic kidney disease: Secondary | ICD-10-CM | POA: Diagnosis not present

## 2022-08-03 DIAGNOSIS — N186 End stage renal disease: Secondary | ICD-10-CM | POA: Diagnosis not present

## 2022-08-03 DIAGNOSIS — N2581 Secondary hyperparathyroidism of renal origin: Secondary | ICD-10-CM | POA: Diagnosis not present

## 2022-08-03 DIAGNOSIS — Z992 Dependence on renal dialysis: Secondary | ICD-10-CM | POA: Diagnosis not present

## 2022-08-04 DIAGNOSIS — Z01818 Encounter for other preprocedural examination: Secondary | ICD-10-CM | POA: Diagnosis not present

## 2022-08-05 DIAGNOSIS — Z992 Dependence on renal dialysis: Secondary | ICD-10-CM | POA: Diagnosis not present

## 2022-08-05 DIAGNOSIS — D509 Iron deficiency anemia, unspecified: Secondary | ICD-10-CM | POA: Diagnosis not present

## 2022-08-05 DIAGNOSIS — R110 Nausea: Secondary | ICD-10-CM | POA: Diagnosis not present

## 2022-08-05 DIAGNOSIS — D631 Anemia in chronic kidney disease: Secondary | ICD-10-CM | POA: Diagnosis not present

## 2022-08-05 DIAGNOSIS — N2581 Secondary hyperparathyroidism of renal origin: Secondary | ICD-10-CM | POA: Diagnosis not present

## 2022-08-05 DIAGNOSIS — R197 Diarrhea, unspecified: Secondary | ICD-10-CM | POA: Diagnosis not present

## 2022-08-05 DIAGNOSIS — N186 End stage renal disease: Secondary | ICD-10-CM | POA: Diagnosis not present

## 2022-08-06 DIAGNOSIS — M6281 Muscle weakness (generalized): Secondary | ICD-10-CM | POA: Diagnosis not present

## 2022-08-06 DIAGNOSIS — M542 Cervicalgia: Secondary | ICD-10-CM | POA: Diagnosis not present

## 2022-08-07 DIAGNOSIS — N186 End stage renal disease: Secondary | ICD-10-CM | POA: Diagnosis not present

## 2022-08-07 DIAGNOSIS — N2581 Secondary hyperparathyroidism of renal origin: Secondary | ICD-10-CM | POA: Diagnosis not present

## 2022-08-07 DIAGNOSIS — R197 Diarrhea, unspecified: Secondary | ICD-10-CM | POA: Diagnosis not present

## 2022-08-07 DIAGNOSIS — Z992 Dependence on renal dialysis: Secondary | ICD-10-CM | POA: Diagnosis not present

## 2022-08-07 DIAGNOSIS — D509 Iron deficiency anemia, unspecified: Secondary | ICD-10-CM | POA: Diagnosis not present

## 2022-08-07 DIAGNOSIS — D631 Anemia in chronic kidney disease: Secondary | ICD-10-CM | POA: Diagnosis not present

## 2022-08-07 DIAGNOSIS — R110 Nausea: Secondary | ICD-10-CM | POA: Diagnosis not present

## 2022-08-10 DIAGNOSIS — N186 End stage renal disease: Secondary | ICD-10-CM | POA: Diagnosis not present

## 2022-08-10 DIAGNOSIS — R110 Nausea: Secondary | ICD-10-CM | POA: Diagnosis not present

## 2022-08-10 DIAGNOSIS — Z992 Dependence on renal dialysis: Secondary | ICD-10-CM | POA: Diagnosis not present

## 2022-08-10 DIAGNOSIS — R197 Diarrhea, unspecified: Secondary | ICD-10-CM | POA: Diagnosis not present

## 2022-08-10 DIAGNOSIS — D631 Anemia in chronic kidney disease: Secondary | ICD-10-CM | POA: Diagnosis not present

## 2022-08-10 DIAGNOSIS — D509 Iron deficiency anemia, unspecified: Secondary | ICD-10-CM | POA: Diagnosis not present

## 2022-08-10 DIAGNOSIS — N2581 Secondary hyperparathyroidism of renal origin: Secondary | ICD-10-CM | POA: Diagnosis not present

## 2022-08-11 DIAGNOSIS — M542 Cervicalgia: Secondary | ICD-10-CM | POA: Diagnosis not present

## 2022-08-11 DIAGNOSIS — M6281 Muscle weakness (generalized): Secondary | ICD-10-CM | POA: Diagnosis not present

## 2022-08-12 DIAGNOSIS — N186 End stage renal disease: Secondary | ICD-10-CM | POA: Diagnosis not present

## 2022-08-12 DIAGNOSIS — D631 Anemia in chronic kidney disease: Secondary | ICD-10-CM | POA: Diagnosis not present

## 2022-08-12 DIAGNOSIS — Z992 Dependence on renal dialysis: Secondary | ICD-10-CM | POA: Diagnosis not present

## 2022-08-12 DIAGNOSIS — N2581 Secondary hyperparathyroidism of renal origin: Secondary | ICD-10-CM | POA: Diagnosis not present

## 2022-08-12 DIAGNOSIS — D509 Iron deficiency anemia, unspecified: Secondary | ICD-10-CM | POA: Diagnosis not present

## 2022-08-12 DIAGNOSIS — R197 Diarrhea, unspecified: Secondary | ICD-10-CM | POA: Diagnosis not present

## 2022-08-12 DIAGNOSIS — R110 Nausea: Secondary | ICD-10-CM | POA: Diagnosis not present

## 2022-08-26 DIAGNOSIS — R197 Diarrhea, unspecified: Secondary | ICD-10-CM | POA: Diagnosis not present

## 2022-08-26 DIAGNOSIS — N186 End stage renal disease: Secondary | ICD-10-CM | POA: Diagnosis not present

## 2022-08-26 DIAGNOSIS — Z992 Dependence on renal dialysis: Secondary | ICD-10-CM | POA: Diagnosis not present

## 2022-08-26 DIAGNOSIS — N2581 Secondary hyperparathyroidism of renal origin: Secondary | ICD-10-CM | POA: Diagnosis not present

## 2022-08-26 DIAGNOSIS — R110 Nausea: Secondary | ICD-10-CM | POA: Diagnosis not present

## 2022-08-26 DIAGNOSIS — D509 Iron deficiency anemia, unspecified: Secondary | ICD-10-CM | POA: Diagnosis not present

## 2022-08-26 DIAGNOSIS — D631 Anemia in chronic kidney disease: Secondary | ICD-10-CM | POA: Diagnosis not present

## 2022-08-27 DIAGNOSIS — M6281 Muscle weakness (generalized): Secondary | ICD-10-CM | POA: Diagnosis not present

## 2022-08-27 DIAGNOSIS — M542 Cervicalgia: Secondary | ICD-10-CM | POA: Diagnosis not present

## 2022-08-28 DIAGNOSIS — Z992 Dependence on renal dialysis: Secondary | ICD-10-CM | POA: Diagnosis not present

## 2022-08-28 DIAGNOSIS — R110 Nausea: Secondary | ICD-10-CM | POA: Diagnosis not present

## 2022-08-28 DIAGNOSIS — N186 End stage renal disease: Secondary | ICD-10-CM | POA: Diagnosis not present

## 2022-08-28 DIAGNOSIS — R197 Diarrhea, unspecified: Secondary | ICD-10-CM | POA: Diagnosis not present

## 2022-08-28 DIAGNOSIS — D631 Anemia in chronic kidney disease: Secondary | ICD-10-CM | POA: Diagnosis not present

## 2022-08-28 DIAGNOSIS — D509 Iron deficiency anemia, unspecified: Secondary | ICD-10-CM | POA: Diagnosis not present

## 2022-08-28 DIAGNOSIS — N2581 Secondary hyperparathyroidism of renal origin: Secondary | ICD-10-CM | POA: Diagnosis not present

## 2022-08-30 DIAGNOSIS — N186 End stage renal disease: Secondary | ICD-10-CM | POA: Diagnosis not present

## 2022-08-30 DIAGNOSIS — Z992 Dependence on renal dialysis: Secondary | ICD-10-CM | POA: Diagnosis not present

## 2022-08-30 DIAGNOSIS — E1129 Type 2 diabetes mellitus with other diabetic kidney complication: Secondary | ICD-10-CM | POA: Diagnosis not present

## 2022-08-31 DIAGNOSIS — D631 Anemia in chronic kidney disease: Secondary | ICD-10-CM | POA: Diagnosis not present

## 2022-08-31 DIAGNOSIS — Z992 Dependence on renal dialysis: Secondary | ICD-10-CM | POA: Diagnosis not present

## 2022-08-31 DIAGNOSIS — D509 Iron deficiency anemia, unspecified: Secondary | ICD-10-CM | POA: Diagnosis not present

## 2022-08-31 DIAGNOSIS — N186 End stage renal disease: Secondary | ICD-10-CM | POA: Diagnosis not present

## 2022-08-31 DIAGNOSIS — E1122 Type 2 diabetes mellitus with diabetic chronic kidney disease: Secondary | ICD-10-CM | POA: Diagnosis not present

## 2022-08-31 DIAGNOSIS — N2581 Secondary hyperparathyroidism of renal origin: Secondary | ICD-10-CM | POA: Diagnosis not present

## 2022-09-02 DIAGNOSIS — D631 Anemia in chronic kidney disease: Secondary | ICD-10-CM | POA: Diagnosis not present

## 2022-09-02 DIAGNOSIS — N186 End stage renal disease: Secondary | ICD-10-CM | POA: Diagnosis not present

## 2022-09-02 DIAGNOSIS — E1122 Type 2 diabetes mellitus with diabetic chronic kidney disease: Secondary | ICD-10-CM | POA: Diagnosis not present

## 2022-09-02 DIAGNOSIS — N2581 Secondary hyperparathyroidism of renal origin: Secondary | ICD-10-CM | POA: Diagnosis not present

## 2022-09-02 DIAGNOSIS — D509 Iron deficiency anemia, unspecified: Secondary | ICD-10-CM | POA: Diagnosis not present

## 2022-09-02 DIAGNOSIS — Z992 Dependence on renal dialysis: Secondary | ICD-10-CM | POA: Diagnosis not present

## 2022-09-04 DIAGNOSIS — E1122 Type 2 diabetes mellitus with diabetic chronic kidney disease: Secondary | ICD-10-CM | POA: Diagnosis not present

## 2022-09-04 DIAGNOSIS — N186 End stage renal disease: Secondary | ICD-10-CM | POA: Diagnosis not present

## 2022-09-04 DIAGNOSIS — D509 Iron deficiency anemia, unspecified: Secondary | ICD-10-CM | POA: Diagnosis not present

## 2022-09-04 DIAGNOSIS — N2581 Secondary hyperparathyroidism of renal origin: Secondary | ICD-10-CM | POA: Diagnosis not present

## 2022-09-04 DIAGNOSIS — D631 Anemia in chronic kidney disease: Secondary | ICD-10-CM | POA: Diagnosis not present

## 2022-09-04 DIAGNOSIS — Z992 Dependence on renal dialysis: Secondary | ICD-10-CM | POA: Diagnosis not present

## 2022-09-07 DIAGNOSIS — N2581 Secondary hyperparathyroidism of renal origin: Secondary | ICD-10-CM | POA: Diagnosis not present

## 2022-09-07 DIAGNOSIS — N186 End stage renal disease: Secondary | ICD-10-CM | POA: Diagnosis not present

## 2022-09-07 DIAGNOSIS — E1122 Type 2 diabetes mellitus with diabetic chronic kidney disease: Secondary | ICD-10-CM | POA: Diagnosis not present

## 2022-09-07 DIAGNOSIS — D631 Anemia in chronic kidney disease: Secondary | ICD-10-CM | POA: Diagnosis not present

## 2022-09-07 DIAGNOSIS — Z992 Dependence on renal dialysis: Secondary | ICD-10-CM | POA: Diagnosis not present

## 2022-09-07 DIAGNOSIS — D509 Iron deficiency anemia, unspecified: Secondary | ICD-10-CM | POA: Diagnosis not present

## 2022-09-09 DIAGNOSIS — N186 End stage renal disease: Secondary | ICD-10-CM | POA: Diagnosis not present

## 2022-09-09 DIAGNOSIS — D509 Iron deficiency anemia, unspecified: Secondary | ICD-10-CM | POA: Diagnosis not present

## 2022-09-09 DIAGNOSIS — E1122 Type 2 diabetes mellitus with diabetic chronic kidney disease: Secondary | ICD-10-CM | POA: Diagnosis not present

## 2022-09-09 DIAGNOSIS — N2581 Secondary hyperparathyroidism of renal origin: Secondary | ICD-10-CM | POA: Diagnosis not present

## 2022-09-09 DIAGNOSIS — D631 Anemia in chronic kidney disease: Secondary | ICD-10-CM | POA: Diagnosis not present

## 2022-09-09 DIAGNOSIS — Z992 Dependence on renal dialysis: Secondary | ICD-10-CM | POA: Diagnosis not present

## 2022-09-11 DIAGNOSIS — N2581 Secondary hyperparathyroidism of renal origin: Secondary | ICD-10-CM | POA: Diagnosis not present

## 2022-09-11 DIAGNOSIS — E1122 Type 2 diabetes mellitus with diabetic chronic kidney disease: Secondary | ICD-10-CM | POA: Diagnosis not present

## 2022-09-11 DIAGNOSIS — Z992 Dependence on renal dialysis: Secondary | ICD-10-CM | POA: Diagnosis not present

## 2022-09-11 DIAGNOSIS — D509 Iron deficiency anemia, unspecified: Secondary | ICD-10-CM | POA: Diagnosis not present

## 2022-09-11 DIAGNOSIS — D631 Anemia in chronic kidney disease: Secondary | ICD-10-CM | POA: Diagnosis not present

## 2022-09-11 DIAGNOSIS — N186 End stage renal disease: Secondary | ICD-10-CM | POA: Diagnosis not present

## 2022-09-14 DIAGNOSIS — N2581 Secondary hyperparathyroidism of renal origin: Secondary | ICD-10-CM | POA: Diagnosis not present

## 2022-09-14 DIAGNOSIS — N186 End stage renal disease: Secondary | ICD-10-CM | POA: Diagnosis not present

## 2022-09-14 DIAGNOSIS — E1122 Type 2 diabetes mellitus with diabetic chronic kidney disease: Secondary | ICD-10-CM | POA: Diagnosis not present

## 2022-09-14 DIAGNOSIS — D509 Iron deficiency anemia, unspecified: Secondary | ICD-10-CM | POA: Diagnosis not present

## 2022-09-14 DIAGNOSIS — D631 Anemia in chronic kidney disease: Secondary | ICD-10-CM | POA: Diagnosis not present

## 2022-09-14 DIAGNOSIS — Z992 Dependence on renal dialysis: Secondary | ICD-10-CM | POA: Diagnosis not present

## 2022-09-16 DIAGNOSIS — Z992 Dependence on renal dialysis: Secondary | ICD-10-CM | POA: Diagnosis not present

## 2022-09-16 DIAGNOSIS — D509 Iron deficiency anemia, unspecified: Secondary | ICD-10-CM | POA: Diagnosis not present

## 2022-09-16 DIAGNOSIS — N186 End stage renal disease: Secondary | ICD-10-CM | POA: Diagnosis not present

## 2022-09-16 DIAGNOSIS — E1122 Type 2 diabetes mellitus with diabetic chronic kidney disease: Secondary | ICD-10-CM | POA: Diagnosis not present

## 2022-09-16 DIAGNOSIS — D631 Anemia in chronic kidney disease: Secondary | ICD-10-CM | POA: Diagnosis not present

## 2022-09-16 DIAGNOSIS — N2581 Secondary hyperparathyroidism of renal origin: Secondary | ICD-10-CM | POA: Diagnosis not present

## 2022-09-18 DIAGNOSIS — D631 Anemia in chronic kidney disease: Secondary | ICD-10-CM | POA: Diagnosis not present

## 2022-09-18 DIAGNOSIS — Z992 Dependence on renal dialysis: Secondary | ICD-10-CM | POA: Diagnosis not present

## 2022-09-18 DIAGNOSIS — E1122 Type 2 diabetes mellitus with diabetic chronic kidney disease: Secondary | ICD-10-CM | POA: Diagnosis not present

## 2022-09-18 DIAGNOSIS — D509 Iron deficiency anemia, unspecified: Secondary | ICD-10-CM | POA: Diagnosis not present

## 2022-09-18 DIAGNOSIS — N2581 Secondary hyperparathyroidism of renal origin: Secondary | ICD-10-CM | POA: Diagnosis not present

## 2022-09-18 DIAGNOSIS — N186 End stage renal disease: Secondary | ICD-10-CM | POA: Diagnosis not present

## 2022-09-21 DIAGNOSIS — E1122 Type 2 diabetes mellitus with diabetic chronic kidney disease: Secondary | ICD-10-CM | POA: Diagnosis not present

## 2022-09-21 DIAGNOSIS — D509 Iron deficiency anemia, unspecified: Secondary | ICD-10-CM | POA: Diagnosis not present

## 2022-09-21 DIAGNOSIS — Z992 Dependence on renal dialysis: Secondary | ICD-10-CM | POA: Diagnosis not present

## 2022-09-21 DIAGNOSIS — N2581 Secondary hyperparathyroidism of renal origin: Secondary | ICD-10-CM | POA: Diagnosis not present

## 2022-09-21 DIAGNOSIS — N186 End stage renal disease: Secondary | ICD-10-CM | POA: Diagnosis not present

## 2022-09-21 DIAGNOSIS — D631 Anemia in chronic kidney disease: Secondary | ICD-10-CM | POA: Diagnosis not present

## 2022-09-23 DIAGNOSIS — N2581 Secondary hyperparathyroidism of renal origin: Secondary | ICD-10-CM | POA: Diagnosis not present

## 2022-09-23 DIAGNOSIS — D631 Anemia in chronic kidney disease: Secondary | ICD-10-CM | POA: Diagnosis not present

## 2022-09-23 DIAGNOSIS — E1122 Type 2 diabetes mellitus with diabetic chronic kidney disease: Secondary | ICD-10-CM | POA: Diagnosis not present

## 2022-09-23 DIAGNOSIS — N186 End stage renal disease: Secondary | ICD-10-CM | POA: Diagnosis not present

## 2022-09-23 DIAGNOSIS — Z992 Dependence on renal dialysis: Secondary | ICD-10-CM | POA: Diagnosis not present

## 2022-09-23 DIAGNOSIS — D509 Iron deficiency anemia, unspecified: Secondary | ICD-10-CM | POA: Diagnosis not present

## 2022-09-24 ENCOUNTER — Ambulatory Visit
Admission: RE | Admit: 2022-09-24 | Discharge: 2022-09-24 | Disposition: A | Discharge status: Home / Self Care | Payer: Medicare Other | Source: Ambulatory Visit | Attending: Nurse Practitioner | Admitting: Nurse Practitioner

## 2022-09-24 ENCOUNTER — Ambulatory Visit
Admission: RE | Admit: 2022-09-24 | Discharge: 2022-09-24 | Disposition: A | Discharge status: Home / Self Care | Payer: 59 | Source: Ambulatory Visit | Attending: Nurse Practitioner | Admitting: Nurse Practitioner

## 2022-09-24 ENCOUNTER — Encounter: Payer: Self-pay | Admitting: Gastroenterology

## 2022-09-24 DIAGNOSIS — Z1231 Encounter for screening mammogram for malignant neoplasm of breast: Secondary | ICD-10-CM | POA: Diagnosis not present

## 2022-09-24 DIAGNOSIS — M810 Age-related osteoporosis without current pathological fracture: Secondary | ICD-10-CM | POA: Diagnosis not present

## 2022-09-24 DIAGNOSIS — Z780 Asymptomatic menopausal state: Secondary | ICD-10-CM | POA: Diagnosis not present

## 2022-09-25 ENCOUNTER — Other Ambulatory Visit: Payer: Self-pay | Admitting: Nurse Practitioner

## 2022-09-25 DIAGNOSIS — M810 Age-related osteoporosis without current pathological fracture: Secondary | ICD-10-CM

## 2022-09-25 DIAGNOSIS — N2581 Secondary hyperparathyroidism of renal origin: Secondary | ICD-10-CM | POA: Diagnosis not present

## 2022-09-25 DIAGNOSIS — D509 Iron deficiency anemia, unspecified: Secondary | ICD-10-CM | POA: Diagnosis not present

## 2022-09-25 DIAGNOSIS — Z992 Dependence on renal dialysis: Secondary | ICD-10-CM | POA: Diagnosis not present

## 2022-09-25 DIAGNOSIS — E1122 Type 2 diabetes mellitus with diabetic chronic kidney disease: Secondary | ICD-10-CM | POA: Diagnosis not present

## 2022-09-25 DIAGNOSIS — N186 End stage renal disease: Secondary | ICD-10-CM | POA: Diagnosis not present

## 2022-09-25 DIAGNOSIS — D631 Anemia in chronic kidney disease: Secondary | ICD-10-CM | POA: Diagnosis not present

## 2022-09-25 NOTE — Progress Notes (Signed)
Called pt and inform results.Gh 

## 2022-09-28 DIAGNOSIS — Z992 Dependence on renal dialysis: Secondary | ICD-10-CM | POA: Diagnosis not present

## 2022-09-28 DIAGNOSIS — D509 Iron deficiency anemia, unspecified: Secondary | ICD-10-CM | POA: Diagnosis not present

## 2022-09-28 DIAGNOSIS — E1122 Type 2 diabetes mellitus with diabetic chronic kidney disease: Secondary | ICD-10-CM | POA: Diagnosis not present

## 2022-09-28 DIAGNOSIS — N186 End stage renal disease: Secondary | ICD-10-CM | POA: Diagnosis not present

## 2022-09-28 DIAGNOSIS — D631 Anemia in chronic kidney disease: Secondary | ICD-10-CM | POA: Diagnosis not present

## 2022-09-28 DIAGNOSIS — N2581 Secondary hyperparathyroidism of renal origin: Secondary | ICD-10-CM | POA: Diagnosis not present

## 2022-09-29 DIAGNOSIS — N186 End stage renal disease: Secondary | ICD-10-CM | POA: Diagnosis not present

## 2022-09-29 DIAGNOSIS — Z992 Dependence on renal dialysis: Secondary | ICD-10-CM | POA: Diagnosis not present

## 2022-09-29 DIAGNOSIS — E1129 Type 2 diabetes mellitus with other diabetic kidney complication: Secondary | ICD-10-CM | POA: Diagnosis not present

## 2022-09-30 DIAGNOSIS — N186 End stage renal disease: Secondary | ICD-10-CM | POA: Diagnosis not present

## 2022-09-30 DIAGNOSIS — D631 Anemia in chronic kidney disease: Secondary | ICD-10-CM | POA: Diagnosis not present

## 2022-09-30 DIAGNOSIS — E1122 Type 2 diabetes mellitus with diabetic chronic kidney disease: Secondary | ICD-10-CM | POA: Diagnosis not present

## 2022-09-30 DIAGNOSIS — R52 Pain, unspecified: Secondary | ICD-10-CM | POA: Diagnosis not present

## 2022-09-30 DIAGNOSIS — D509 Iron deficiency anemia, unspecified: Secondary | ICD-10-CM | POA: Diagnosis not present

## 2022-09-30 DIAGNOSIS — Z992 Dependence on renal dialysis: Secondary | ICD-10-CM | POA: Diagnosis not present

## 2022-09-30 DIAGNOSIS — N2581 Secondary hyperparathyroidism of renal origin: Secondary | ICD-10-CM | POA: Diagnosis not present

## 2022-10-02 DIAGNOSIS — Z992 Dependence on renal dialysis: Secondary | ICD-10-CM | POA: Diagnosis not present

## 2022-10-02 DIAGNOSIS — D631 Anemia in chronic kidney disease: Secondary | ICD-10-CM | POA: Diagnosis not present

## 2022-10-02 DIAGNOSIS — R52 Pain, unspecified: Secondary | ICD-10-CM | POA: Diagnosis not present

## 2022-10-02 DIAGNOSIS — D509 Iron deficiency anemia, unspecified: Secondary | ICD-10-CM | POA: Diagnosis not present

## 2022-10-02 DIAGNOSIS — N186 End stage renal disease: Secondary | ICD-10-CM | POA: Diagnosis not present

## 2022-10-02 DIAGNOSIS — E1122 Type 2 diabetes mellitus with diabetic chronic kidney disease: Secondary | ICD-10-CM | POA: Diagnosis not present

## 2022-10-02 DIAGNOSIS — N2581 Secondary hyperparathyroidism of renal origin: Secondary | ICD-10-CM | POA: Diagnosis not present

## 2022-10-05 DIAGNOSIS — R52 Pain, unspecified: Secondary | ICD-10-CM | POA: Diagnosis not present

## 2022-10-05 DIAGNOSIS — D631 Anemia in chronic kidney disease: Secondary | ICD-10-CM | POA: Diagnosis not present

## 2022-10-05 DIAGNOSIS — Z992 Dependence on renal dialysis: Secondary | ICD-10-CM | POA: Diagnosis not present

## 2022-10-05 DIAGNOSIS — N2581 Secondary hyperparathyroidism of renal origin: Secondary | ICD-10-CM | POA: Diagnosis not present

## 2022-10-05 DIAGNOSIS — D509 Iron deficiency anemia, unspecified: Secondary | ICD-10-CM | POA: Diagnosis not present

## 2022-10-05 DIAGNOSIS — E1122 Type 2 diabetes mellitus with diabetic chronic kidney disease: Secondary | ICD-10-CM | POA: Diagnosis not present

## 2022-10-05 DIAGNOSIS — N186 End stage renal disease: Secondary | ICD-10-CM | POA: Diagnosis not present

## 2022-10-07 ENCOUNTER — Other Ambulatory Visit: Payer: Self-pay

## 2022-10-07 ENCOUNTER — Emergency Department (HOSPITAL_COMMUNITY): Payer: 59

## 2022-10-07 ENCOUNTER — Emergency Department (HOSPITAL_COMMUNITY)
Admission: EM | Admit: 2022-10-07 | Discharge: 2022-10-07 | Disposition: A | Discharge status: Home / Self Care | Payer: 59 | Attending: Emergency Medicine | Admitting: Emergency Medicine

## 2022-10-07 DIAGNOSIS — N186 End stage renal disease: Secondary | ICD-10-CM | POA: Diagnosis not present

## 2022-10-07 DIAGNOSIS — N2581 Secondary hyperparathyroidism of renal origin: Secondary | ICD-10-CM | POA: Diagnosis not present

## 2022-10-07 DIAGNOSIS — Z043 Encounter for examination and observation following other accident: Secondary | ICD-10-CM | POA: Diagnosis not present

## 2022-10-07 DIAGNOSIS — W19XXXA Unspecified fall, initial encounter: Secondary | ICD-10-CM | POA: Diagnosis not present

## 2022-10-07 DIAGNOSIS — E1122 Type 2 diabetes mellitus with diabetic chronic kidney disease: Secondary | ICD-10-CM | POA: Diagnosis not present

## 2022-10-07 DIAGNOSIS — Z992 Dependence on renal dialysis: Secondary | ICD-10-CM | POA: Diagnosis not present

## 2022-10-07 DIAGNOSIS — R52 Pain, unspecified: Secondary | ICD-10-CM | POA: Diagnosis not present

## 2022-10-07 DIAGNOSIS — I10 Essential (primary) hypertension: Secondary | ICD-10-CM | POA: Diagnosis not present

## 2022-10-07 DIAGNOSIS — R109 Unspecified abdominal pain: Secondary | ICD-10-CM | POA: Diagnosis not present

## 2022-10-07 DIAGNOSIS — D631 Anemia in chronic kidney disease: Secondary | ICD-10-CM | POA: Diagnosis not present

## 2022-10-07 DIAGNOSIS — D509 Iron deficiency anemia, unspecified: Secondary | ICD-10-CM | POA: Diagnosis not present

## 2022-10-07 LAB — COMPREHENSIVE METABOLIC PANEL
ALT: 10 U/L (ref 0–44)
AST: 15 U/L (ref 15–41)
Albumin: 3.8 g/dL (ref 3.5–5.0)
Alkaline Phosphatase: 110 U/L (ref 38–126)
Anion gap: 13 (ref 5–15)
BUN: 19 mg/dL (ref 8–23)
CO2: 29 mmol/L (ref 22–32)
Calcium: 8.5 mg/dL — ABNORMAL LOW (ref 8.9–10.3)
Chloride: 94 mmol/L — ABNORMAL LOW (ref 98–111)
Creatinine, Ser: 4.21 mg/dL — ABNORMAL HIGH (ref 0.44–1.00)
GFR, Estimated: 11 mL/min — ABNORMAL LOW (ref >60)
Glucose, Bld: 93 mg/dL (ref 70–99)
Potassium: 3.3 mmol/L — ABNORMAL LOW (ref 3.5–5.1)
Sodium: 136 mmol/L (ref 135–145)
Total Bilirubin: 0.5 mg/dL (ref 0.3–1.2)
Total Protein: 7.5 g/dL (ref 6.5–8.1)

## 2022-10-07 LAB — CBC WITH DIFFERENTIAL/PLATELET
Abs Immature Granulocytes: 0.04 10*3/uL (ref 0.00–0.07)
Basophils Absolute: 0 10*3/uL (ref 0.0–0.1)
Basophils Relative: 1 %
Eosinophils Absolute: 0 10*3/uL (ref 0.0–0.5)
Eosinophils Relative: 0 %
HCT: 33 % — ABNORMAL LOW (ref 36.0–46.0)
Hemoglobin: 10.6 g/dL — ABNORMAL LOW (ref 12.0–15.0)
Immature Granulocytes: 1 %
Lymphocytes Relative: 18 %
Lymphs Abs: 1.6 10*3/uL (ref 0.7–4.0)
MCH: 29.4 pg (ref 26.0–34.0)
MCHC: 32.1 g/dL (ref 30.0–36.0)
MCV: 91.4 fL (ref 80.0–100.0)
Monocytes Absolute: 0.8 10*3/uL (ref 0.1–1.0)
Monocytes Relative: 9 %
Neutro Abs: 6.4 10*3/uL (ref 1.7–7.7)
Neutrophils Relative %: 71 %
Platelets: 161 10*3/uL (ref 150–400)
RBC: 3.61 MIL/uL — ABNORMAL LOW (ref 3.87–5.11)
RDW: 14.7 % (ref 11.5–15.5)
WBC: 8.9 10*3/uL (ref 4.0–10.5)
nRBC: 0 % (ref 0.0–0.2)

## 2022-10-07 MED ORDER — HYDROMORPHONE HCL 1 MG/ML IJ SOLN
1.0000 mg | Freq: Once | INTRAMUSCULAR | Status: AC
  Administered 2022-10-07: 1 mg via INTRAMUSCULAR
  Filled 2022-10-07: qty 1

## 2022-10-07 MED ORDER — ONDANSETRON 4 MG PO TBDP
4.0000 mg | ORAL_TABLET | Freq: Once | ORAL | Status: AC
  Administered 2022-10-07: 4 mg via ORAL
  Filled 2022-10-07: qty 1

## 2022-10-07 MED ORDER — HYDROCODONE-ACETAMINOPHEN 5-325 MG PO TABS: 1.0000 | ORAL_TABLET | Freq: Four times a day (QID) | ORAL | 0 refills | Status: AC | PRN

## 2022-10-07 MED ORDER — ONDANSETRON 4 MG PO TBDP: 4.0000 mg | ORAL_TABLET | Freq: Three times a day (TID) | ORAL | 0 refills | Status: DC | PRN

## 2022-10-07 NOTE — Discharge Instructions (Addendum)
Your evaluation today has been assuring.  Your CT scan and labs did not show any dangerous abnormalities.  Your pain may be due to your recent fall.  Please follow up with your physician.  Return here for concerning changes in your condition.  Su valoracin de hoy ha sido tranquilizadora. Su tomografa computarizada y sus anlisis de laboratorio no mostraron ninguna anomala peligrosa. Su dolor puede deberse a su reciente cada. Por favor haga un seguimiento con su mdico.  Regrese aqu para conocer cambios preocupantes en su condicin.

## 2022-10-07 NOTE — ED Provider Notes (Signed)
[place] [facility]MENT AT [facility] Provider Note   CSN: [id number] Arrival date & time: 10/07/22  1221     History  Chief Complaint  Patient presents with   Flank Pain    Rachael Burke is a 67 y.o. female.  HPI Patient presents with concern of left flank pain.  Onset was possibly 4 days ago, though the patient notes that she had a fall a week prior to that.  Pain is focal, nonradiating, with no dysuria, no other abdominal pain, no chest pain, no fever.  No relief with anything. Patient's history includes end-stage renal disease.  She did complete a session of dialysis today.     Home Medications Prior to Admission medications   Medication Sig Start Date End Date Taking? Authorizing Provider  HYDROcodone-acetaminophen (NORCO/VICODIN) 5-325 MG tablet Take 1 tablet by mouth every 6 (six) hours as needed for severe pain. 10/07/22  Yes Gerhard Munch, MD  ondansetron (ZOFRAN-ODT) 4 MG disintegrating tablet Take 1 tablet (4 mg total) by mouth every 8 (eight) hours as needed for nausea or vomiting. 10/07/22  Yes Gerhard Munch, MD  acetaminophen (TYLENOL) 325 MG tablet Take 2 tablets (650 mg total) by mouth every 6 (six) hours as needed for mild pain or moderate pain. 09/05/21   Renne Crigler, PA-C  amLODipine (NORVASC) 10 MG tablet Take 1 tablet by mouth daily.    [provider]  calcium acetate (PHOSLO) 667 MG capsule Take 667 mg by mouth 3 (three) times daily. 04/02/21   [provider]  carvedilol (COREG) 12.5 MG tablet Take 1 tablet (12.5 mg total) by mouth 2 (two) times daily. 04/29/22 07/30/22  Custovic, Rozell Searing, DO  gabapentin (NEURONTIN) 100 MG capsule Take 300 mg by mouth at bedtime. 09/03/21   [provider]  isosorbide-hydrALAZINE (BIDIL) 20-37.5 MG tablet Take 1 tablet by mouth 3 (three) times daily. 01/04/22   Lonia Blood, MD  meclizine (ANTIVERT) 12.5 MG tablet Take 1 tablet (12.5 mg total) by mouth 2 (two) times  daily as needed for dizziness. 06/26/22   Antony Madura, MD  nitroGLYCERIN (NITROSTAT) 0.4 MG SL tablet Place 1 tablet (0.4 mg total) under the tongue every 5 (five) minutes as needed for chest pain. 01/09/22 07/30/22  Custovic, Rozell Searing, DO  olopatadine (PATANOL) 0.1 % ophthalmic solution Place 1 drop into both eyes 2 (two) times daily as needed for allergies. Patient not taking: Reported on 07/14/2022    [provider]  omeprazole (PRILOSEC OTC) 20 MG tablet Take 20 mg by mouth daily as needed (indigestion).    [provider]  ondansetron (ZOFRAN-ODT) 4 MG disintegrating tablet Take 1 tablet (4 mg total) by mouth every 8 (eight) hours as needed for nausea or vomiting. 03/02/22   Alvira Monday, MD  polyethylene glycol (MIRALAX / GLYCOLAX) 17 g packet Take 17 g by mouth daily.    [provider]      Allergies    Ceftriaxone    Review of Systems   Review of Systems  All other systems reviewed and are negative.   Physical Exam Updated Vital Signs BP (!) 189/78 (BP Location: Right Arm)   Pulse 68   Temp 98.5 F (36.9 C) (Oral)   Resp 11   SpO2 97%  Physical Exam Vitals and nursing note reviewed.  Constitutional:      General: She is not in acute distress.    Appearance: She is well-developed.  HENT:     Head: Normocephalic  and atraumatic.  Eyes:     Conjunctiva/sclera: Conjunctivae normal.  Cardiovascular:     Rate and Rhythm: Normal rate and regular rhythm.  Pulmonary:     Effort: Pulmonary effort is normal. No respiratory distress.     Breath sounds: Normal breath sounds. No stridor.  Abdominal:     General: There is no distension.     Tenderness: There is no abdominal tenderness. There is no guarding.  Musculoskeletal:     Comments: L UE HD acess, unremarkable.  Skin:    General: Skin is warm and dry.  Neurological:     Mental Status: She is alert and oriented to person, place, and time.     Cranial Nerves: No cranial nerve deficit.   Psychiatric:        Mood and Affect: Mood normal.     ED Results / Procedures / Treatments   Labs (all labs ordered are listed, but only abnormal results are displayed) Labs Reviewed  COMPREHENSIVE METABOLIC PANEL - Abnormal; Notable for the following components:      Result Value   Potassium 3.3 (*)    Chloride 94 (*)    Creatinine, Ser 4.21 (*)    Calcium 8.5 (*)    GFR, Estimated 11 (*)    All other components within normal limits  CBC WITH DIFFERENTIAL/PLATELET - Abnormal; Notable for the following components:   RBC 3.61 (*)    Hemoglobin 10.6 (*)    HCT 33.0 (*)    All other components within normal limits  URINALYSIS, ROUTINE W REFLEX MICROSCOPIC    EKG None  Radiology CT Renal Stone Study  Result Date: 10/07/2022 CLINICAL DATA:  Severe left flank pain with tenderness to palpation. EXAM: CT ABDOMEN AND PELVIS WITHOUT CONTRAST TECHNIQUE: Multidetector CT imaging of the abdomen and pelvis was performed following the standard protocol without IV contrast. RADIATION DOSE REDUCTION: This exam was performed according to the departmental dose-optimization program which includes automated exposure control, adjustment of the mA and/or kV according to patient size and/or use of iterative reconstruction technique. COMPARISON:  06/22/2022 FINDINGS: Lower chest: Calcified granuloma noted right middle lobe. Hepatobiliary: No suspicious focal abnormality in the liver on this study without intravenous contrast. Gallbladder is surgically absent. No intrahepatic or extrahepatic biliary dilation. Pancreas: No focal mass lesion. No dilatation of the main duct. No intraparenchymal cyst. No peripancreatic edema. Spleen: No splenomegaly. No focal mass lesion. Adrenals/Urinary Tract: No adrenal nodule or mass. Kidneys unremarkable without evidence for stone disease or hydronephrosis. No evidence for hydroureter. The urinary bladder appears normal for the degree of distention. Stomach/Bowel: Stomach is  unremarkable. No gastric wall thickening. No evidence of outlet obstruction. Duodenum is normally positioned as is the ligament of Treitz. No small bowel wall thickening. No small bowel dilatation. The terminal ileum is normal. The appendix is normal. No gross colonic mass. No colonic wall thickening. Vascular/Lymphatic: There is moderate atherosclerotic calcification of the abdominal aorta without aneurysm. There is no gastrohepatic or hepatoduodenal ligament lymphadenopathy. No retroperitoneal or mesenteric lymphadenopathy. No pelvic sidewall lymphadenopathy. Reproductive: Hysterectomy.  There is no adnexal mass. Other: No intraperitoneal free fluid. Musculoskeletal: No worrisome lytic or sclerotic osseous abnormality. IMPRESSION: 1. No acute findings in the abdomen or pelvis. Specifically, no findings to explain the patient's history of left flank pain. 2. Status post cholecystectomy and hysterectomy. 3.  Aortic Atherosclerosis (ICD10-I70.0). Electronically Signed   By: Kennith Center M.D.   On: 10/07/2022 13:54   CT L-SPINE NO CHARGE  Result Date: 10/07/2022  CLINICAL DATA:  Fall last 7 today. Severe left flank pain. Tenderness to palpation. EXAM: CT LUMBAR SPINE WITHOUT CONTRAST TECHNIQUE: Multidetector CT imaging of the lumbar spine was performed without intravenous contrast administration. Multiplanar CT image reconstructions were also generated. RADIATION DOSE REDUCTION: This exam was performed according to the departmental dose-optimization program which includes automated exposure control, adjustment of the mA and/or kV according to patient size and/or use of iterative reconstruction technique. COMPARISON:  None Available. FINDINGS: Segmentation: 5 lumbar type vertebrae. Alignment: Normal. Vertebrae: No acute fracture or focal pathologic process. Paraspinal and other soft tissues: Negative. Disc levels: Intervertebral disc spaces are preserved throughout the lumbar spine. Facets are well aligned  bilaterally without substantial facet degeneration. SI joints unremarkable. There is mild multifactorial central canal stenosis at L3-4 due to posterior broad-based bulging discs and thickening of the ligamentum flavum. Similar mild multifactorial chronic central canal stenosis seen L4-5. IMPRESSION: 1. No acute fracture or listhesis of the lumbar spine. 2. Mild multifactorial central canal stenosis at L3-4 and L4-5. Electronically Signed   By: Kennith Center M.D.   On: 10/07/2022 13:45    Procedures Procedures    Medications Ordered in ED Medications  HYDROmorphone (DILAUDID) injection 1 mg (1 mg Intramuscular Given 10/07/22 1458)    ED Course/ Medical Decision Making/ A&P                             Medical Decision Making Adult female with multiple medical issues including end-stage renal disease presents with flank pain.  With her history of renal dysfunction fall, differential is broad, including nephrolithiasis, intra-abdominal injury, lumbar spin issue, all considered. Patient is not hypoxic, tachypneic, low suspicion for PE or other thoracic injury.  Amount and/or Complexity of Data Reviewed External Data Reviewed: notes. Labs: ordered. Decision-making details documented in ED Course. Radiology: ordered and independent interpretation performed. Decision-making details documented in ED Course.  Risk Prescription drug management.   4:25 PM Patient in no distress, awake, alert, moving both legs spontaneously.  She is hemodynamically unremarkable aside from mild hypertension. CT reviewed, no evidence for acute pathology, labs reviewed, consistent with known renal dysfunction.  Some suspicion for the patient's pain being secondary to recent fall, no evidence for cutaneous changes versus shingles.  Patient may have also passed a kidney stone.  Given her improvement here with analgesics she was discharged with same to follow-up with primary care.         Final Clinical  Impression(s) / ED Diagnoses Final diagnoses:  Left flank pain    Rx / DC Orders ED Discharge Orders          Ordered    ondansetron (ZOFRAN-ODT) 4 MG disintegrating tablet  Every 8 hours PRN        10/07/22 1614    HYDROcodone-acetaminophen (NORCO/VICODIN) 5-325 MG tablet  Every 6 hours PRN        10/07/22 1614              Gerhard Munch, MD 10/07/22 1705

## 2022-10-07 NOTE — ED Triage Notes (Addendum)
Patient had fall last Sunday, pain started most recent Sunday, and severe left flank pain started today. VSS, NAD, received dialysis today per normal. EMS reports tenderness on palpation. Patient is spanish speaking.

## 2022-10-09 DIAGNOSIS — D509 Iron deficiency anemia, unspecified: Secondary | ICD-10-CM | POA: Diagnosis not present

## 2022-10-09 DIAGNOSIS — N2581 Secondary hyperparathyroidism of renal origin: Secondary | ICD-10-CM | POA: Diagnosis not present

## 2022-10-09 DIAGNOSIS — Z992 Dependence on renal dialysis: Secondary | ICD-10-CM | POA: Diagnosis not present

## 2022-10-09 DIAGNOSIS — D631 Anemia in chronic kidney disease: Secondary | ICD-10-CM | POA: Diagnosis not present

## 2022-10-09 DIAGNOSIS — E1122 Type 2 diabetes mellitus with diabetic chronic kidney disease: Secondary | ICD-10-CM | POA: Diagnosis not present

## 2022-10-09 DIAGNOSIS — R52 Pain, unspecified: Secondary | ICD-10-CM | POA: Diagnosis not present

## 2022-10-09 DIAGNOSIS — N186 End stage renal disease: Secondary | ICD-10-CM | POA: Diagnosis not present

## 2022-10-12 DIAGNOSIS — R52 Pain, unspecified: Secondary | ICD-10-CM | POA: Diagnosis not present

## 2022-10-12 DIAGNOSIS — E1122 Type 2 diabetes mellitus with diabetic chronic kidney disease: Secondary | ICD-10-CM | POA: Diagnosis not present

## 2022-10-12 DIAGNOSIS — N2581 Secondary hyperparathyroidism of renal origin: Secondary | ICD-10-CM | POA: Diagnosis not present

## 2022-10-12 DIAGNOSIS — D631 Anemia in chronic kidney disease: Secondary | ICD-10-CM | POA: Diagnosis not present

## 2022-10-12 DIAGNOSIS — Z992 Dependence on renal dialysis: Secondary | ICD-10-CM | POA: Diagnosis not present

## 2022-10-12 DIAGNOSIS — N186 End stage renal disease: Secondary | ICD-10-CM | POA: Diagnosis not present

## 2022-10-12 DIAGNOSIS — D509 Iron deficiency anemia, unspecified: Secondary | ICD-10-CM | POA: Diagnosis not present

## 2022-10-14 DIAGNOSIS — D509 Iron deficiency anemia, unspecified: Secondary | ICD-10-CM | POA: Diagnosis not present

## 2022-10-14 DIAGNOSIS — N186 End stage renal disease: Secondary | ICD-10-CM | POA: Diagnosis not present

## 2022-10-14 DIAGNOSIS — D631 Anemia in chronic kidney disease: Secondary | ICD-10-CM | POA: Diagnosis not present

## 2022-10-14 DIAGNOSIS — Z992 Dependence on renal dialysis: Secondary | ICD-10-CM | POA: Diagnosis not present

## 2022-10-14 DIAGNOSIS — E1122 Type 2 diabetes mellitus with diabetic chronic kidney disease: Secondary | ICD-10-CM | POA: Diagnosis not present

## 2022-10-14 DIAGNOSIS — R52 Pain, unspecified: Secondary | ICD-10-CM | POA: Diagnosis not present

## 2022-10-14 DIAGNOSIS — N2581 Secondary hyperparathyroidism of renal origin: Secondary | ICD-10-CM | POA: Diagnosis not present

## 2022-10-14 IMAGING — DX DG CHEST 1V PORT
1 series · 1 of 1 positions shown · non-contrast
Comparison: Portable exam 1103 hours compared to 12/29/2019

CLINICAL DATA: Dialysis catheter insertion

EXAM:
PORTABLE CHEST 1 VIEW

[chest ap]
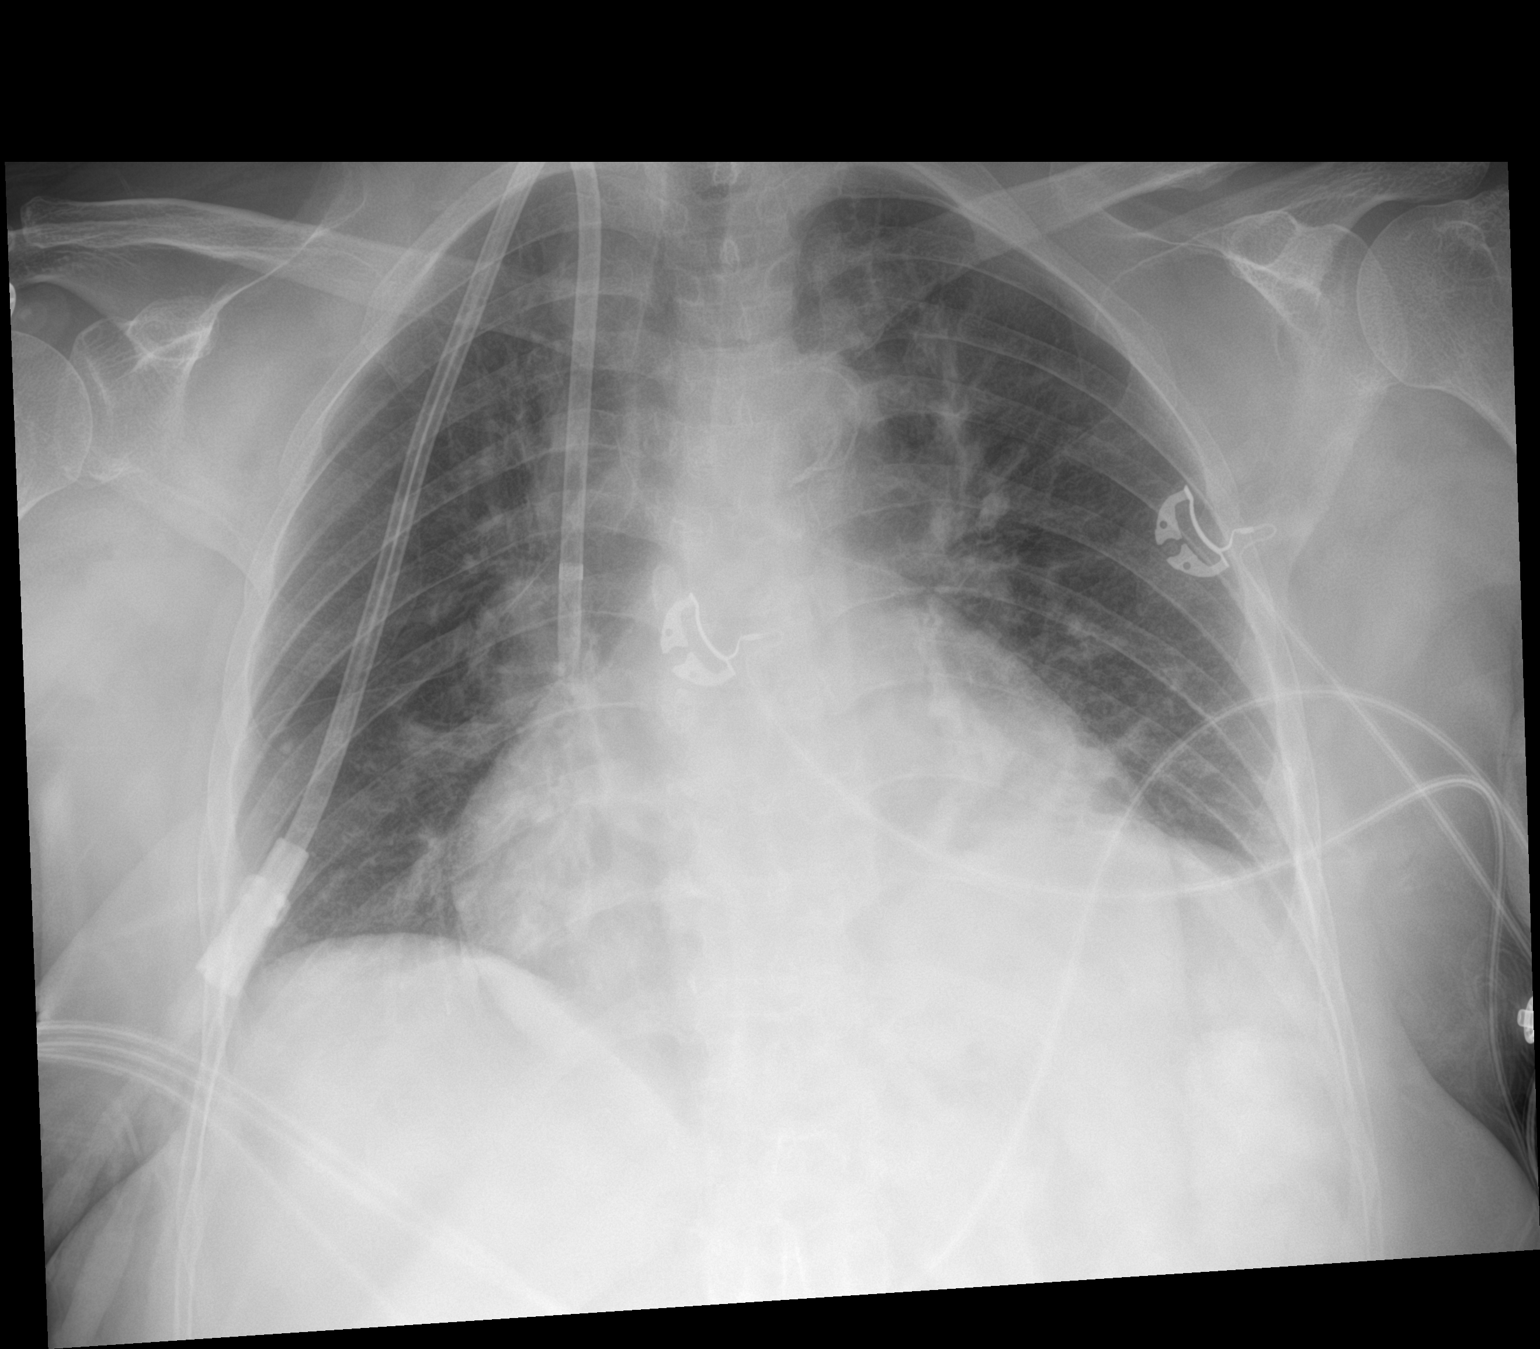

[1 of 1 positions shown; findings below may reference images not displayed]

FINDINGS: RIGHT jugular line tip projecting over SVC.

Enlargement of cardiac silhouette with pulmonary vascular
congestion.

Atherosclerotic calcification aorta.

Bibasilar atelectasis greater on LEFT.

No infiltrate, pleural effusion, or pneumothorax.
IMPRESSION: No pneumothorax following RIGHT jugular line placement.

Bibasilar atelectasis greater on LEFT.

Aortic Atherosclerosis ([id number]).

## 2022-10-16 DIAGNOSIS — D509 Iron deficiency anemia, unspecified: Secondary | ICD-10-CM | POA: Diagnosis not present

## 2022-10-16 DIAGNOSIS — N186 End stage renal disease: Secondary | ICD-10-CM | POA: Diagnosis not present

## 2022-10-16 DIAGNOSIS — E1122 Type 2 diabetes mellitus with diabetic chronic kidney disease: Secondary | ICD-10-CM | POA: Diagnosis not present

## 2022-10-16 DIAGNOSIS — R52 Pain, unspecified: Secondary | ICD-10-CM | POA: Diagnosis not present

## 2022-10-16 DIAGNOSIS — Z992 Dependence on renal dialysis: Secondary | ICD-10-CM | POA: Diagnosis not present

## 2022-10-16 DIAGNOSIS — N2581 Secondary hyperparathyroidism of renal origin: Secondary | ICD-10-CM | POA: Diagnosis not present

## 2022-10-16 DIAGNOSIS — D631 Anemia in chronic kidney disease: Secondary | ICD-10-CM | POA: Diagnosis not present

## 2022-10-19 DIAGNOSIS — Z992 Dependence on renal dialysis: Secondary | ICD-10-CM | POA: Diagnosis not present

## 2022-10-19 DIAGNOSIS — N2581 Secondary hyperparathyroidism of renal origin: Secondary | ICD-10-CM | POA: Diagnosis not present

## 2022-10-19 DIAGNOSIS — E1122 Type 2 diabetes mellitus with diabetic chronic kidney disease: Secondary | ICD-10-CM | POA: Diagnosis not present

## 2022-10-19 DIAGNOSIS — D509 Iron deficiency anemia, unspecified: Secondary | ICD-10-CM | POA: Diagnosis not present

## 2022-10-19 DIAGNOSIS — N186 End stage renal disease: Secondary | ICD-10-CM | POA: Diagnosis not present

## 2022-10-19 DIAGNOSIS — R52 Pain, unspecified: Secondary | ICD-10-CM | POA: Diagnosis not present

## 2022-10-19 DIAGNOSIS — D631 Anemia in chronic kidney disease: Secondary | ICD-10-CM | POA: Diagnosis not present

## 2022-10-21 DIAGNOSIS — E1122 Type 2 diabetes mellitus with diabetic chronic kidney disease: Secondary | ICD-10-CM | POA: Diagnosis not present

## 2022-10-21 DIAGNOSIS — N186 End stage renal disease: Secondary | ICD-10-CM | POA: Diagnosis not present

## 2022-10-21 DIAGNOSIS — R52 Pain, unspecified: Secondary | ICD-10-CM | POA: Diagnosis not present

## 2022-10-21 DIAGNOSIS — D631 Anemia in chronic kidney disease: Secondary | ICD-10-CM | POA: Diagnosis not present

## 2022-10-21 DIAGNOSIS — Z992 Dependence on renal dialysis: Secondary | ICD-10-CM | POA: Diagnosis not present

## 2022-10-21 DIAGNOSIS — N2581 Secondary hyperparathyroidism of renal origin: Secondary | ICD-10-CM | POA: Diagnosis not present

## 2022-10-21 DIAGNOSIS — D509 Iron deficiency anemia, unspecified: Secondary | ICD-10-CM | POA: Diagnosis not present

## 2022-10-23 DIAGNOSIS — N2581 Secondary hyperparathyroidism of renal origin: Secondary | ICD-10-CM | POA: Diagnosis not present

## 2022-10-23 DIAGNOSIS — N186 End stage renal disease: Secondary | ICD-10-CM | POA: Diagnosis not present

## 2022-10-23 DIAGNOSIS — E1122 Type 2 diabetes mellitus with diabetic chronic kidney disease: Secondary | ICD-10-CM | POA: Diagnosis not present

## 2022-10-23 DIAGNOSIS — R52 Pain, unspecified: Secondary | ICD-10-CM | POA: Diagnosis not present

## 2022-10-23 DIAGNOSIS — Z992 Dependence on renal dialysis: Secondary | ICD-10-CM | POA: Diagnosis not present

## 2022-10-23 DIAGNOSIS — D509 Iron deficiency anemia, unspecified: Secondary | ICD-10-CM | POA: Diagnosis not present

## 2022-10-23 DIAGNOSIS — D631 Anemia in chronic kidney disease: Secondary | ICD-10-CM | POA: Diagnosis not present

## 2022-10-26 ENCOUNTER — Emergency Department (HOSPITAL_COMMUNITY)
Admission: EM | Admit: 2022-10-26 | Discharge: 2022-10-26 | Disposition: A | Discharge status: Home / Self Care | Payer: 59 | Attending: Emergency Medicine | Admitting: Emergency Medicine

## 2022-10-26 ENCOUNTER — Encounter (HOSPITAL_COMMUNITY): Payer: Self-pay

## 2022-10-26 ENCOUNTER — Other Ambulatory Visit: Payer: Self-pay

## 2022-10-26 ENCOUNTER — Ambulatory Visit (HOSPITAL_COMMUNITY)
Admission: EM | Admit: 2022-10-26 | Discharge: 2022-10-26 | Disposition: A | Discharge status: Home / Self Care | Payer: 59 | Attending: Family Medicine | Admitting: Family Medicine

## 2022-10-26 ENCOUNTER — Emergency Department (HOSPITAL_COMMUNITY): Payer: 59

## 2022-10-26 DIAGNOSIS — M5459 Other low back pain: Secondary | ICD-10-CM | POA: Diagnosis not present

## 2022-10-26 DIAGNOSIS — N186 End stage renal disease: Secondary | ICD-10-CM | POA: Diagnosis not present

## 2022-10-26 DIAGNOSIS — D509 Iron deficiency anemia, unspecified: Secondary | ICD-10-CM | POA: Diagnosis not present

## 2022-10-26 DIAGNOSIS — N2581 Secondary hyperparathyroidism of renal origin: Secondary | ICD-10-CM | POA: Diagnosis not present

## 2022-10-26 DIAGNOSIS — R1030 Lower abdominal pain, unspecified: Secondary | ICD-10-CM | POA: Diagnosis not present

## 2022-10-26 DIAGNOSIS — Z79899 Other long term (current) drug therapy: Secondary | ICD-10-CM | POA: Diagnosis not present

## 2022-10-26 DIAGNOSIS — E1122 Type 2 diabetes mellitus with diabetic chronic kidney disease: Secondary | ICD-10-CM | POA: Insufficient documentation

## 2022-10-26 DIAGNOSIS — R1084 Generalized abdominal pain: Secondary | ICD-10-CM | POA: Diagnosis not present

## 2022-10-26 DIAGNOSIS — W19XXXA Unspecified fall, initial encounter: Secondary | ICD-10-CM | POA: Insufficient documentation

## 2022-10-26 DIAGNOSIS — I120 Hypertensive chronic kidney disease with stage 5 chronic kidney disease or end stage renal disease: Secondary | ICD-10-CM | POA: Insufficient documentation

## 2022-10-26 DIAGNOSIS — R52 Pain, unspecified: Secondary | ICD-10-CM | POA: Diagnosis not present

## 2022-10-26 DIAGNOSIS — Z992 Dependence on renal dialysis: Secondary | ICD-10-CM | POA: Insufficient documentation

## 2022-10-26 DIAGNOSIS — Z7984 Long term (current) use of oral hypoglycemic drugs: Secondary | ICD-10-CM | POA: Diagnosis not present

## 2022-10-26 DIAGNOSIS — R109 Unspecified abdominal pain: Secondary | ICD-10-CM | POA: Diagnosis not present

## 2022-10-26 DIAGNOSIS — D631 Anemia in chronic kidney disease: Secondary | ICD-10-CM | POA: Diagnosis not present

## 2022-10-26 DIAGNOSIS — M545 Low back pain, unspecified: Secondary | ICD-10-CM | POA: Diagnosis not present

## 2022-10-26 LAB — LIPASE, BLOOD: Lipase: 119 U/L — ABNORMAL HIGH (ref 11–51)

## 2022-10-26 LAB — COMPREHENSIVE METABOLIC PANEL
ALT: 9 U/L (ref 0–44)
AST: 17 U/L (ref 15–41)
Albumin: 4.1 g/dL (ref 3.5–5.0)
Alkaline Phosphatase: 121 U/L (ref 38–126)
Anion gap: 13 (ref 5–15)
BUN: 28 mg/dL — ABNORMAL HIGH (ref 8–23)
CO2: 29 mmol/L (ref 22–32)
Calcium: 8.7 mg/dL — ABNORMAL LOW (ref 8.9–10.3)
Chloride: 96 mmol/L — ABNORMAL LOW (ref 98–111)
Creatinine, Ser: 4.84 mg/dL — ABNORMAL HIGH (ref 0.44–1.00)
GFR, Estimated: 9 mL/min — ABNORMAL LOW (ref >60)
Glucose, Bld: 106 mg/dL — ABNORMAL HIGH (ref 70–99)
Potassium: 4.7 mmol/L (ref 3.5–5.1)
Sodium: 138 mmol/L (ref 135–145)
Total Bilirubin: 0.4 mg/dL (ref 0.3–1.2)
Total Protein: 8.3 g/dL — ABNORMAL HIGH (ref 6.5–8.1)

## 2022-10-26 LAB — CBC WITH DIFFERENTIAL/PLATELET
Abs Immature Granulocytes: 0.01 10*3/uL (ref 0.00–0.07)
Basophils Absolute: 0 10*3/uL (ref 0.0–0.1)
Basophils Relative: 1 %
Eosinophils Absolute: 0.1 10*3/uL (ref 0.0–0.5)
Eosinophils Relative: 1 %
HCT: 32.9 % — ABNORMAL LOW (ref 36.0–46.0)
Hemoglobin: 10.7 g/dL — ABNORMAL LOW (ref 12.0–15.0)
Immature Granulocytes: 0 %
Lymphocytes Relative: 23 %
Lymphs Abs: 1.6 10*3/uL (ref 0.7–4.0)
MCH: 29.7 pg (ref 26.0–34.0)
MCHC: 32.5 g/dL (ref 30.0–36.0)
MCV: 91.4 fL (ref 80.0–100.0)
Monocytes Absolute: 0.6 10*3/uL (ref 0.1–1.0)
Monocytes Relative: 8 %
Neutro Abs: 4.7 10*3/uL (ref 1.7–7.7)
Neutrophils Relative %: 67 %
Platelets: 151 10*3/uL (ref 150–400)
RBC: 3.6 MIL/uL — ABNORMAL LOW (ref 3.87–5.11)
RDW: 14.2 % (ref 11.5–15.5)
WBC: 6.9 10*3/uL (ref 4.0–10.5)
nRBC: 0 % (ref 0.0–0.2)

## 2022-10-26 MED ORDER — OXYCODONE HCL 5 MG PO TABS: 5.0000 mg | ORAL_TABLET | Freq: Four times a day (QID) | ORAL | 0 refills | Status: DC | PRN

## 2022-10-26 MED ORDER — OXYCODONE-ACETAMINOPHEN 5-325 MG PO TABS
1.0000 | ORAL_TABLET | Freq: Once | ORAL | Status: AC
  Administered 2022-10-26: 1 via ORAL
  Filled 2022-10-26: qty 1

## 2022-10-26 NOTE — ED Provider Notes (Signed)
[place] EMERGENCY DEPARTMENT AT [facility] Provider Note   CSN: [id number] Arrival date & time: 10/26/22  2014     History  Chief Complaint  Patient presents with   Abdominal Pain    Rachael Burke is a 67 y.o. female.  Patient here with lower abdominal pain that started tonight.  Feels like she is constipated.  She has some ongoing low back pain after fall 10 days ago where she landed on her buttocks.  Did not hit her head.  Overall she denies any nausea vomiting diarrhea.  She has tried some home remedies for constipation but has not helped.  Has not tried any MiraLAX.  Has a history of chronic kidney disease on hemodialysis.  Not on any blood thinners.  She has a history of diabetes hypertension high cholesterol otherwise.  Denies any abdominal surgery except for gallbladder surgery.  Does not make any urine.  Denies any chest pain or shortness of breath.  The history is provided by the patient.       Home Medications Prior to Admission medications   Medication Sig Start Date End Date Taking? Authorizing Provider  oxyCODONE (ROXICODONE) 5 MG immediate release tablet Take 1 tablet (5 mg total) by mouth every 6 (six) hours as needed for up to 10 doses for breakthrough pain. 10/26/22  Yes Chou Busler, DO  acetaminophen (TYLENOL) 325 MG tablet Take 2 tablets (650 mg total) by mouth every 6 (six) hours as needed for mild pain or moderate pain. 09/05/21   Renne Crigler, PA-C  amLODipine (NORVASC) 10 MG tablet Take 1 tablet by mouth daily.    [provider]  calcium acetate (PHOSLO) 667 MG capsule Take 667 mg by mouth 3 (three) times daily. 04/02/21   [provider]  carvedilol (COREG) 12.5 MG tablet Take 1 tablet (12.5 mg total) by mouth 2 (two) times daily. 04/29/22 07/30/22  Custovic, Rozell Searing, DO  gabapentin (NEURONTIN) 100 MG capsule Take 300 mg by mouth at bedtime. 09/03/21   [provider]  HYDROcodone-acetaminophen (NORCO/VICODIN)  5-325 MG tablet Take 1 tablet by mouth every 6 (six) hours as needed for severe pain. 10/07/22   Gerhard Munch, MD  isosorbide-hydrALAZINE (BIDIL) 20-37.5 MG tablet Take 1 tablet by mouth 3 (three) times daily. 01/04/22   Lonia Blood, MD  meclizine (ANTIVERT) 12.5 MG tablet Take 1 tablet (12.5 mg total) by mouth 2 (two) times daily as needed for dizziness. 06/26/22   Antony Madura, MD  nitroGLYCERIN (NITROSTAT) 0.4 MG SL tablet Place 1 tablet (0.4 mg total) under the tongue every 5 (five) minutes as needed for chest pain. 01/09/22 07/30/22  Custovic, Rozell Searing, DO  olopatadine (PATANOL) 0.1 % ophthalmic solution Place 1 drop into both eyes 2 (two) times daily as needed for allergies. Patient not taking: Reported on 07/14/2022    [provider]  omeprazole (PRILOSEC OTC) 20 MG tablet Take 20 mg by mouth daily as needed (indigestion).    [provider]  ondansetron (ZOFRAN-ODT) 4 MG disintegrating tablet Take 1 tablet (4 mg total) by mouth every 8 (eight) hours as needed for nausea or vomiting. 03/02/22   Alvira Monday, MD  ondansetron (ZOFRAN-ODT) 4 MG disintegrating tablet Take 1 tablet (4 mg total) by mouth every 8 (eight) hours as needed for nausea or vomiting. 10/07/22   Gerhard Munch, MD  polyethylene glycol (MIRALAX / GLYCOLAX) 17 g packet Take 17 g by mouth daily.    [provider]  Allergies    Ceftriaxone    Review of Systems   Review of Systems  Physical Exam Updated Vital Signs BP (!) 166/68 (BP Location: Right Arm)   Pulse 70   Temp 98.5 F (36.9 C) (Oral)   Resp 18   Ht 5' (1.524 m)   Wt 66 kg   SpO2 97%   BMI 28.42 kg/m  Physical Exam Vitals and nursing note reviewed.  Constitutional:      General: She is not in acute distress.    Appearance: She is well-developed. She is not ill-appearing.  HENT:     Head: Normocephalic and atraumatic.     Mouth/Throat:     Mouth: Mucous membranes are moist.  Eyes:     Extraocular Movements:  Extraocular movements intact.     Conjunctiva/sclera: Conjunctivae normal.     Pupils: Pupils are equal, round, and reactive to light.  Cardiovascular:     Rate and Rhythm: Normal rate and regular rhythm.     Heart sounds: Normal heart sounds. No murmur heard. Pulmonary:     Effort: Pulmonary effort is normal. No respiratory distress.     Breath sounds: Normal breath sounds.  Abdominal:     Palpations: Abdomen is soft.     Tenderness: There is generalized abdominal tenderness.  Musculoskeletal:        General: No swelling.     Cervical back: Neck supple.     Comments: Tenderness to paraspinal lumbar muscles, no midline spinal tenderness  Skin:    General: Skin is warm and dry.     Capillary Refill: Capillary refill takes less than 2 seconds.  Neurological:     General: No focal deficit present.     Mental Status: She is alert.     Cranial Nerves: No cranial nerve deficit.     Motor: No weakness.  Psychiatric:        Mood and Affect: Mood normal.     ED Results / Procedures / Treatments   Labs (all labs ordered are listed, but only abnormal results are displayed) Labs Reviewed  CBC WITH DIFFERENTIAL/PLATELET - Abnormal; Notable for the following components:      Result Value   RBC 3.60 (*)    Hemoglobin 10.7 (*)    HCT 32.9 (*)    All other components within normal limits  COMPREHENSIVE METABOLIC PANEL - Abnormal; Notable for the following components:   Chloride 96 (*)    Glucose, Bld 106 (*)    BUN 28 (*)    Creatinine, Ser 4.84 (*)    Calcium 8.7 (*)    Total Protein 8.3 (*)    GFR, Estimated 9 (*)    All other components within normal limits  LIPASE, BLOOD - Abnormal; Notable for the following components:   Lipase 119 (*)    All other components within normal limits    EKG None  Radiology CT L-SPINE NO CHARGE  Result Date: 10/26/2022 CLINICAL DATA:  Pain, recent fall. Tailbone pain and left flank pain. EXAM: CT LUMBAR SPINE WITHOUT CONTRAST TECHNIQUE:  Multidetector CT imaging of the lumbar spine was performed without intravenous contrast administration. Multiplanar CT image reconstructions were also generated. RADIATION DOSE REDUCTION: This exam was performed according to the departmental dose-optimization program which includes automated exposure control, adjustment of the mA and/or kV according to patient size and/or use of iterative reconstruction technique. COMPARISON:  10/07/2022 FINDINGS: Segmentation: 5 lumbar type vertebrae. Alignment: Normal. Vertebrae: No acute fracture or focal pathologic process. Paraspinal and  other soft tissues: Negative. Disc levels: Mild broad-based disc bulge at L3-4 and L4-5. This in combination with mild ligamentum flavum hypertrophy causes mild central spinal stenosis at these levels. This is unchanged since prior study. No focal disc herniation. IMPRESSION: No acute bony abnormality. Mild multifactorial central spinal stenosis at L3-4 and L4-5 as described above. Electronically Signed   By: Charlett Nose M.D.   On: 10/26/2022 21:32   CT Renal Stone Study  Result Date: 10/26/2022 CLINICAL DATA:  Abdominal/flank pain, stone suspected. Fall 10 days ago, hit tailbone. Low back pain. EXAM: CT ABDOMEN AND PELVIS WITHOUT CONTRAST TECHNIQUE: Multidetector CT imaging of the abdomen and pelvis was performed following the standard protocol without IV contrast. RADIATION DOSE REDUCTION: This exam was performed according to the departmental dose-optimization program which includes automated exposure control, adjustment of the mA and/or kV according to patient size and/or use of iterative reconstruction technique. COMPARISON:  10/07/2022 FINDINGS: Lower chest: No acute abnormality Hepatobiliary: No focal liver abnormality is seen. Status post cholecystectomy. No biliary dilatation. Pancreas: No focal abnormality or ductal dilatation. Spleen: No focal abnormality.  Normal size. Adrenals/Urinary Tract: No renal or ureteral stones. No  hydronephrosis. Adrenal glands unremarkable. Urinary bladder decompressed, grossly unremarkable. Stomach/Bowel: Normal appendix. Stomach, large and small bowel grossly unremarkable. Vascular/Lymphatic: Aortic atherosclerosis. No evidence of aneurysm or adenopathy. Reproductive: Prior hysterectomy.  No adnexal masses. Other: No free fluid or free air. Musculoskeletal: No acute bony abnormality. No abnormality noted in the sacrum/coccyx. IMPRESSION: No renal or ureteral stones.  No hydronephrosis. No acute findings. Aortic atherosclerosis. Electronically Signed   By: Charlett Nose M.D.   On: 10/26/2022 21:30    Procedures Procedures    Medications Ordered in ED Medications  oxyCODONE-acetaminophen (PERCOCET/ROXICET) 5-325 MG per tablet 1 tablet (1 tablet Oral Given 10/26/22 2052)    ED Course/ Medical Decision Making/ A&P                             Medical Decision Making Amount and/or Complexity of Data Reviewed Labs: ordered. Radiology: ordered.  Risk Prescription drug management.   Rachael Burke is here with lower abdominal pain, constipation, low back pain.  She had a fall 10 days ago landed on her low back has been having some discomfort.  Now with some lower abdominal pain today.  Maybe some constipation symptoms.  Denies any chest pain or shortness of breath.  Has a history of chronic kidney disease on dialysis.  Not on any blood thinners.  She did not hit her head or lose consciousness when she fell 10 days ago.  She has a history of diabetes, hypertension, high cholesterol otherwise.  Differential diagnosis likely gas related pain but will get CT scan to rule out bowel obstruction, diverticulitis.  Will get an image of her low back to make sure there is no compression fracture.  She is neurovascular neuromuscular intact otherwise.  Patient with no significant anemia or leukocytosis.  Creatinine at baseline.  CT scan of the abdomen and pelvis and low back per radiology  report unremarkable.  Overall suspect muscular process may be complicated with some constipation.  Will prescribe narcotic pain medicine for breakthrough pain.  However this may complicate any constipation.  Recommend aggressive use of MiraLAX.  Recommend follow-up with primary care doctor.  Discharged in good condition.  This chart was dictated using voice recognition software.  Despite best efforts to proofread,  errors can occur which can  change the documentation meaning.         Final Clinical Impression(s) / ED Diagnoses Final diagnoses:  Abdominal pain, unspecified abdominal location  Acute low back pain without sciatica, unspecified back pain laterality    Rx / DC Orders ED Discharge Orders          Ordered    oxyCODONE (ROXICODONE) 5 MG immediate release tablet  Every 6 hours PRN        10/26/22 2220              Virgina Norfolk, DO 10/26/22 2225

## 2022-10-26 NOTE — ED Triage Notes (Signed)
Daughter reports the patient fell 10 days ago and hit her tailbone. Since then the patient has had lower back pain and now has abdominal pain. Reports being unable to have a bowel movement or pass gas and some nausea. M,W, F, dialysis patient.

## 2022-10-26 NOTE — Discharge Instructions (Addendum)
She will proceed by private car to the emergency room

## 2022-10-26 NOTE — ED Triage Notes (Signed)
PT fell 10 days ago and now has pain the patient . Pt also went to the Ed 10/07/22 for fall.

## 2022-10-26 NOTE — ED Provider Notes (Signed)
Patient seen briefly in triage.  She presents this evening with severe pain ranked 8-10 out of 10.  A few days ago she had fallen and noted some pain since then and her low back.  Then today it is worsened and is radiating around to her lower abdomen.  No fever or chills.  She is a dialysis patient.  Today Tylenol was not helping and she also took some ibuprofen which did not help.  Due to her severe pain, I have asked she and her family to proceed by private car to the emergency room for higher level of evaluation and treatment then we can provide here in the urgent care center.       Zenia Resides, MD 10/26/22 2006

## 2022-10-27 ENCOUNTER — Other Ambulatory Visit: Payer: Self-pay

## 2022-10-27 ENCOUNTER — Emergency Department (HOSPITAL_COMMUNITY)
Admission: EM | Admit: 2022-10-27 | Discharge: 2022-10-28 | Disposition: A | Discharge status: Home / Self Care | Payer: 59 | Attending: Emergency Medicine | Admitting: Emergency Medicine

## 2022-10-27 ENCOUNTER — Emergency Department (HOSPITAL_COMMUNITY): Payer: 59

## 2022-10-27 ENCOUNTER — Ambulatory Visit: Payer: 59 | Admitting: Internal Medicine

## 2022-10-27 ENCOUNTER — Encounter (HOSPITAL_COMMUNITY): Payer: Self-pay

## 2022-10-27 DIAGNOSIS — Z79899 Other long term (current) drug therapy: Secondary | ICD-10-CM | POA: Diagnosis not present

## 2022-10-27 DIAGNOSIS — R197 Diarrhea, unspecified: Secondary | ICD-10-CM | POA: Diagnosis not present

## 2022-10-27 DIAGNOSIS — E875 Hyperkalemia: Secondary | ICD-10-CM | POA: Diagnosis not present

## 2022-10-27 DIAGNOSIS — I10 Essential (primary) hypertension: Secondary | ICD-10-CM | POA: Diagnosis not present

## 2022-10-27 DIAGNOSIS — R112 Nausea with vomiting, unspecified: Secondary | ICD-10-CM | POA: Diagnosis not present

## 2022-10-27 DIAGNOSIS — R1110 Vomiting, unspecified: Secondary | ICD-10-CM | POA: Diagnosis not present

## 2022-10-27 DIAGNOSIS — J9811 Atelectasis: Secondary | ICD-10-CM | POA: Diagnosis not present

## 2022-10-27 DIAGNOSIS — E119 Type 2 diabetes mellitus without complications: Secondary | ICD-10-CM | POA: Diagnosis not present

## 2022-10-27 DIAGNOSIS — R509 Fever, unspecified: Secondary | ICD-10-CM | POA: Diagnosis not present

## 2022-10-27 LAB — BLOOD GAS, VENOUS
Acid-Base Excess: 9 mmol/L — ABNORMAL HIGH (ref 0.0–2.0)
Bicarbonate: 33.5 mmol/L — ABNORMAL HIGH (ref 20.0–28.0)
O2 Saturation: 95.5 %
Patient temperature: 37
pCO2, Ven: 44 mmHg (ref 44–60)
pH, Ven: 7.49 — ABNORMAL HIGH (ref 7.25–7.43)
pO2, Ven: 69 mmHg — ABNORMAL HIGH (ref 32–45)

## 2022-10-27 LAB — CBC
HCT: 33.7 % — ABNORMAL LOW (ref 36.0–46.0)
Hemoglobin: 10.7 g/dL — ABNORMAL LOW (ref 12.0–15.0)
MCH: 29.9 pg (ref 26.0–34.0)
MCHC: 31.8 g/dL (ref 30.0–36.0)
MCV: 94.1 fL (ref 80.0–100.0)
Platelets: 159 10*3/uL (ref 150–400)
RBC: 3.58 MIL/uL — ABNORMAL LOW (ref 3.87–5.11)
RDW: 14.3 % (ref 11.5–15.5)
WBC: 7.9 10*3/uL (ref 4.0–10.5)
nRBC: 0 % (ref 0.0–0.2)

## 2022-10-27 LAB — COMPREHENSIVE METABOLIC PANEL
ALT: 10 U/L (ref 0–44)
AST: 17 U/L (ref 15–41)
Albumin: 4 g/dL (ref 3.5–5.0)
Alkaline Phosphatase: 111 U/L (ref 38–126)
Anion gap: 14 (ref 5–15)
BUN: 42 mg/dL — ABNORMAL HIGH (ref 8–23)
CO2: 24 mmol/L (ref 22–32)
Calcium: 8.8 mg/dL — ABNORMAL LOW (ref 8.9–10.3)
Chloride: 96 mmol/L — ABNORMAL LOW (ref 98–111)
Creatinine, Ser: 6.71 mg/dL — ABNORMAL HIGH (ref 0.44–1.00)
GFR, Estimated: 6 mL/min — ABNORMAL LOW (ref >60)
Glucose, Bld: 135 mg/dL — ABNORMAL HIGH (ref 70–99)
Potassium: 6.2 mmol/L — ABNORMAL HIGH (ref 3.5–5.1)
Sodium: 134 mmol/L — ABNORMAL LOW (ref 135–145)
Total Bilirubin: 0.7 mg/dL (ref 0.3–1.2)
Total Protein: 7.8 g/dL (ref 6.5–8.1)

## 2022-10-27 LAB — LIPASE, BLOOD: Lipase: 42 U/L (ref 11–51)

## 2022-10-27 MED ORDER — ONDANSETRON 4 MG PO TBDP
4.0000 mg | ORAL_TABLET | Freq: Once | ORAL | Status: AC
  Administered 2022-10-27: 4 mg via ORAL
  Filled 2022-10-27: qty 1

## 2022-10-27 MED ORDER — SODIUM ZIRCONIUM CYCLOSILICATE 10 G PO PACK
10.0000 g | PACK | Freq: Once | ORAL | Status: AC
  Administered 2022-10-27: 10 g via ORAL
  Filled 2022-10-27: qty 1

## 2022-10-27 NOTE — ED Provider Notes (Signed)
[place] [facility]PARTMENT AT [facility] Provider Note   CSN: [id number] Arrival date & time: 10/27/22  2149     History {Add pertinent medical, surgical, social history, OB history to HPI:1} Chief Complaint  Patient presents with   Emesis   Fatigue   Diarrhea    Rachael Burke is a 67 y.o. female.  The history is provided by the patient and medical records. No language interpreter was used.  Emesis Associated symptoms: diarrhea   Diarrhea Associated symptoms: vomiting      67 year old Hispanic speaking female with significant history of diabetes, hypertension, hyperlipidemia, anemia brought here via EMS for complaints of nausea vomit diarrhea.  History obtained through daughter who serves as language interpreter per patient request.  Patient had a mechanical fall nearly 2 weeks ago related to her buttock.  She denies any head or loss of consciousness at that time but she has had some lower back pain.  She endorsed worsening back pain yesterday and was seen and evaluated in the ED for her symptoms.  A CT scan of abdomen pelvis as well as low back was performed and overall reassuring and patient was discharged home with opiate pain medication as well as MiraLAX.  She took MiraLAX this morning but throughout the day she has had persistent nausea vomiting diarrhea with some chills and bodyaches prompting this ER visit.  When she arrived, nurse noted that patient was hypoxic and supplemental oxygen was placed.  Patient is not on any supplemental oxygen.  She denies any significant chest pain shortness of breath or productive cough.  She does not make urine.  She is dialysis patient and has scheduled appointment Wednesday Friday.  Her last dialysis was yesterday.  Home Medications Prior to Admission medications   Medication Sig Start Date End Date Taking? Authorizing Provider  acetaminophen (TYLENOL) 325 MG tablet Take 2 tablets (650 mg total) by mouth every 6  (six) hours as needed for mild pain or moderate pain. 09/05/21   Renne Crigler, PA-C  amLODipine (NORVASC) 10 MG tablet Take 1 tablet by mouth daily.    [provider]  calcium acetate (PHOSLO) 667 MG capsule Take 667 mg by mouth 3 (three) times daily. 04/02/21   [provider]  carvedilol (COREG) 12.5 MG tablet Take 1 tablet (12.5 mg total) by mouth 2 (two) times daily. 04/29/22 07/30/22  Custovic, Rozell Searing, DO  gabapentin (NEURONTIN) 100 MG capsule Take 300 mg by mouth at bedtime. 09/03/21   [provider]  HYDROcodone-acetaminophen (NORCO/VICODIN) 5-325 MG tablet Take 1 tablet by mouth every 6 (six) hours as needed for severe pain. 10/07/22   Gerhard Munch, MD  isosorbide-hydrALAZINE (BIDIL) 20-37.5 MG tablet Take 1 tablet by mouth 3 (three) times daily. 01/04/22   Lonia Blood, MD  meclizine (ANTIVERT) 12.5 MG tablet Take 1 tablet (12.5 mg total) by mouth 2 (two) times daily as needed for dizziness. 06/26/22   Antony Madura, MD  nitroGLYCERIN (NITROSTAT) 0.4 MG SL tablet Place 1 tablet (0.4 mg total) under the tongue every 5 (five) minutes as needed for chest pain. 01/09/22 07/30/22  Custovic, Rozell Searing, DO  olopatadine (PATANOL) 0.1 % ophthalmic solution Place 1 drop into both eyes 2 (two) times daily as needed for allergies. Patient not taking: Reported on 07/14/2022    [provider]  omeprazole (PRILOSEC OTC) 20 MG tablet Take 20 mg by mouth daily as needed (indigestion).    [provider]  ondansetron (ZOFRAN-ODT) 4 MG disintegrating  tablet Take 1 tablet (4 mg total) by mouth every 8 (eight) hours as needed for nausea or vomiting. 03/02/22   Alvira Monday, MD  ondansetron (ZOFRAN-ODT) 4 MG disintegrating tablet Take 1 tablet (4 mg total) by mouth every 8 (eight) hours as needed for nausea or vomiting. 10/07/22   Gerhard Munch, MD  oxyCODONE (ROXICODONE) 5 MG immediate release tablet Take 1 tablet (5 mg total) by mouth every 6 (six) hours as needed  for up to 10 doses for breakthrough pain. 10/26/22   Curatolo, Adam, DO  polyethylene glycol (MIRALAX / GLYCOLAX) 17 g packet Take 17 g by mouth daily.    [provider]      Allergies    Ceftriaxone    Review of Systems   Review of Systems  Gastrointestinal:  Positive for diarrhea and vomiting.  All other systems reviewed and are negative.   Physical Exam Updated Vital Signs BP (!) 183/70   Pulse 77   Temp 98.2 F (36.8 C) (Oral)   Resp 16   Ht 5' (1.524 m)   Wt 66 kg   SpO2 98%   BMI 28.42 kg/m  Physical Exam Vitals and nursing note reviewed.  Constitutional:      General: She is not in acute distress.    Appearance: She is well-developed.  HENT:     Head: Atraumatic.  Eyes:     Conjunctiva/sclera: Conjunctivae normal.  Cardiovascular:     Rate and Rhythm: Normal rate and regular rhythm.     Pulses: Normal pulses.     Heart sounds: Normal heart sounds.  Pulmonary:     Effort: Pulmonary effort is normal.     Breath sounds: No wheezing, rhonchi or rales.  Abdominal:     Palpations: Abdomen is soft.     Tenderness: There is no abdominal tenderness.  Musculoskeletal:        General: Tenderness (Mild lumbar paraspinal tenderness without significant midline spine tenderness.  Equal strength to bilateral lower extremities.) present.     Cervical back: Neck supple.  Skin:    Findings: No rash.     Comments: AV fistula left upper arm with palpable thrill and bruit no evidence of infection.  Neurological:     Mental Status: She is alert.  Psychiatric:        Mood and Affect: Mood normal.     ED Results / Procedures / Treatments   Labs (all labs ordered are listed, but only abnormal results are displayed) Labs Reviewed  CBC - Abnormal; Notable for the following components:      Result Value   RBC 3.58 (*)    Hemoglobin 10.7 (*)    HCT 33.7 (*)    All other components within normal limits  LIPASE, BLOOD  COMPREHENSIVE METABOLIC PANEL    EKG EKG  Interpretation  Date/Time:  Tuesday Oct 27 2022 21:53:20 EDT Ventricular Rate:  77 PR Interval:  193 QRS Duration: 97 QT Interval:  399 QTC Calculation: 452 R Axis:   8 Text Interpretation: Sinus rhythm Confirmed by Alona Bene [phone]) on 10/27/2022 10:08:06 PM  Radiology CT L-SPINE NO CHARGE  Result Date: 10/26/2022 CLINICAL DATA:  Pain, recent fall. Tailbone pain and left flank pain. EXAM: CT LUMBAR SPINE WITHOUT CONTRAST TECHNIQUE: Multidetector CT imaging of the lumbar spine was performed without intravenous contrast administration. Multiplanar CT image reconstructions were also generated. RADIATION DOSE REDUCTION: This exam was performed according to the departmental dose-optimization program which includes automated exposure control, adjustment of the  mA and/or kV according to patient size and/or use of iterative reconstruction technique. COMPARISON:  10/07/2022 FINDINGS: Segmentation: 5 lumbar type vertebrae. Alignment: Normal. Vertebrae: No acute fracture or focal pathologic process. Paraspinal and other soft tissues: Negative. Disc levels: Mild broad-based disc bulge at L3-4 and L4-5. This in combination with mild ligamentum flavum hypertrophy causes mild central spinal stenosis at these levels. This is unchanged since prior study. No focal disc herniation. IMPRESSION: No acute bony abnormality. Mild multifactorial central spinal stenosis at L3-4 and L4-5 as described above. Electronically Signed   By: Charlett Nose M.D.   On: 10/26/2022 21:32   CT Renal Stone Study  Result Date: 10/26/2022 CLINICAL DATA:  Abdominal/flank pain, stone suspected. Fall 10 days ago, hit tailbone. Low back pain. EXAM: CT ABDOMEN AND PELVIS WITHOUT CONTRAST TECHNIQUE: Multidetector CT imaging of the abdomen and pelvis was performed following the standard protocol without IV contrast. RADIATION DOSE REDUCTION: This exam was performed according to the departmental dose-optimization program which includes automated  exposure control, adjustment of the mA and/or kV according to patient size and/or use of iterative reconstruction technique. COMPARISON:  10/07/2022 FINDINGS: Lower chest: No acute abnormality Hepatobiliary: No focal liver abnormality is seen. Status post cholecystectomy. No biliary dilatation. Pancreas: No focal abnormality or ductal dilatation. Spleen: No focal abnormality.  Normal size. Adrenals/Urinary Tract: No renal or ureteral stones. No hydronephrosis. Adrenal glands unremarkable. Urinary bladder decompressed, grossly unremarkable. Stomach/Bowel: Normal appendix. Stomach, large and small bowel grossly unremarkable. Vascular/Lymphatic: Aortic atherosclerosis. No evidence of aneurysm or adenopathy. Reproductive: Prior hysterectomy.  No adnexal masses. Other: No free fluid or free air. Musculoskeletal: No acute bony abnormality. No abnormality noted in the sacrum/coccyx. IMPRESSION: No renal or ureteral stones.  No hydronephrosis. No acute findings. Aortic atherosclerosis. Electronically Signed   By: Charlett Nose M.D.   On: 10/26/2022 21:30    Procedures Procedures  {Document cardiac monitor, telemetry assessment procedure when appropriate:1}  Medications Ordered in ED Medications - No data to display  ED Course/ Medical Decision Making/ A&P   {   Click here for ABCD2, HEART and other calculatorsREFRESH Note before signing :1}                          Medical Decision Making Amount and/or Complexity of Data Reviewed Labs: ordered. Radiology: ordered.  Risk Prescription drug management.   BP (!) 183/70   Pulse 77   Temp 98.2 F (36.8 C) (Oral)   Resp 16   Ht 5' (1.524 m)   Wt 66 kg   SpO2 98%   BMI 28.42 kg/m   62:54 PM  67 year old Hispanic speaking female with significant history of diabetes, hypertension, hyperlipidemia, anemia brought here via EMS for complaints of nausea vomit diarrhea.  History obtained through daughter who serves as language interpreter per patient  request.  Patient had a mechanical fall nearly 2 weeks ago related to her buttock.  She denies any head or loss of consciousness at that time but she has had some lower back pain.  She endorsed worsening back pain yesterday and was seen and evaluated in the ED for her symptoms.  A CT scan of abdomen pelvis as well as low back was performed and overall reassuring and patient was discharged home with opiate pain medication as well as MiraLAX.  She took MiraLAX this morning but throughout the day she has had persistent nausea vomiting diarrhea with some chills and bodyaches prompting this ER visit.  When  she arrived, nurse noted that patient was hypoxic and supplemental oxygen was placed.  Patient is not on any supplemental oxygen.  She denies any significant chest pain shortness of breath or productive cough.  She does not make urine.  She is dialysis patient and has scheduled appointment Wednesday Friday.  Her last dialysis was yesterday.  On exam, patient is laying in bed appears to be in no acute discomfort.  She is wearing supplemental oxygen.  Heart with normal rate and rhythm, lungs are clear to auscultation abdomen is soft and nontender has some mild lumbar paraspinal tenderness on palpation.  She is moving all 4 extremities.  She has a functional left AV fistula without signs of infection.  Vital signs notable for elevated blood pressure of 183/70.  Patient is afebrile and no hypoxia.  Supplemental oxygen was discontinued and patient has been monitored for the past 6 hours without any hypoxia.  VBG did not shows acidosis.  Suspect hypoxia that was documented earlier as an error.  Will continue to monitor closely.  Chest x-ray shows some vascular congestion and atelectasis without focal infiltrate.  -Labs ordered, independently viewed and interpreted by me.  Labs remarkable for vbg without acidosis, K+ 6.2, lokelma given.  Evidence of renal impairment, at baseline.  Normal WBC -The patient was maintained  on a cardiac monitor.  I personally viewed and interpreted the cardiac monitored which showed an underlying rhythm of: NSR -Imaging independently viewed and interpreted by me and I agree with radiologist's interpretation.  Result remarkable for CXR showing R base atelectasis, no focal infiltrate -This patient presents to the ED for concern of n/v/d, this involves an extensive number of treatment options, and is a complaint that carries with it a high risk of complications and morbidity.  The differential diagnosis includes viral gastroenteritis, colitis, diverticulitis, medication induced diarrhea, pancreatitis, billiary disease. -Co morbidities that complicate the patient evaluation includes ESRD, DM, HTN,  -Treatment includes zofran, lokelma -Reevaluation of the patient after these medicines showed that the patient improved -PCP office notes or outside notes reviewed -Discussion with attending Dr. Pilar Plate.  -Escalation to admission/observation considered: patients feels much better, is comfortable with discharge, and will follow up with PCP -Prescription medication considered, patient comfortable with *** -Social Determinant of Health considered which includes ***   {Document critical care time when appropriate:1} {Document review of labs and clinical decision tools ie heart score, Chads2Vasc2 etc:1}  {Document your independent review of radiology images, and any outside records:1} {Document your discussion with family members, caretakers, and with consultants:1} {Document social determinants of health affecting pt's care:1} {Document your decision making why or why not admission, treatments were needed:1} Final Clinical Impression(s) / ED Diagnoses Final diagnoses:  None    Rx / DC Orders ED Discharge Orders     None

## 2022-10-27 NOTE — ED Triage Notes (Signed)
Patient brought in by [facility], reports n/v/d all day after taking miralax. Was seen here yesterday for abd pain and constipation. Per EMS, family states patient has been lethargic/weak for the past two hours. Was prescribed roxicodone yesterday, unsure when patient took last dose.

## 2022-10-28 DIAGNOSIS — R197 Diarrhea, unspecified: Secondary | ICD-10-CM | POA: Diagnosis not present

## 2022-10-28 DIAGNOSIS — Z992 Dependence on renal dialysis: Secondary | ICD-10-CM | POA: Diagnosis not present

## 2022-10-28 DIAGNOSIS — D631 Anemia in chronic kidney disease: Secondary | ICD-10-CM | POA: Diagnosis not present

## 2022-10-28 DIAGNOSIS — N2581 Secondary hyperparathyroidism of renal origin: Secondary | ICD-10-CM | POA: Diagnosis not present

## 2022-10-28 DIAGNOSIS — N186 End stage renal disease: Secondary | ICD-10-CM | POA: Diagnosis not present

## 2022-10-28 DIAGNOSIS — E1122 Type 2 diabetes mellitus with diabetic chronic kidney disease: Secondary | ICD-10-CM | POA: Diagnosis not present

## 2022-10-28 DIAGNOSIS — R52 Pain, unspecified: Secondary | ICD-10-CM | POA: Diagnosis not present

## 2022-10-28 DIAGNOSIS — D509 Iron deficiency anemia, unspecified: Secondary | ICD-10-CM | POA: Diagnosis not present

## 2022-10-28 MED ORDER — ONDANSETRON HCL 4 MG PO TABS: 4.0000 mg | ORAL_TABLET | Freq: Three times a day (TID) | ORAL | 0 refills | Status: AC | PRN

## 2022-10-28 MED ORDER — GABAPENTIN 300 MG PO CAPS
300.0000 mg | ORAL_CAPSULE | Freq: Once | ORAL | Status: AC
  Administered 2022-10-28: 300 mg via ORAL
  Filled 2022-10-28: qty 1

## 2022-10-28 NOTE — Discharge Instructions (Addendum)
Your symptoms likely due to a stomach bug causing nausea vomiting and diarrhea.  You may take Zofran as needed for nausea.  Your potassium level is high today, please continue with your dialysis session as scheduled as this will help control your potassium.  Return to ER if you have any concern.

## 2022-10-28 NOTE — ED Notes (Signed)
Patient given discharge instructions and follow up care. Patient verbalized understanding. IV removed with catheter intact. Patient taken out of ED via wheelchair with daughter. 

## 2022-10-29 ENCOUNTER — Ambulatory Visit: Payer: 59 | Admitting: Internal Medicine

## 2022-10-29 ENCOUNTER — Encounter: Payer: Self-pay | Admitting: Internal Medicine

## 2022-10-29 VITALS — BP 187/80 | HR 76 | Ht 60.0 in | Wt 148.0 lb

## 2022-10-29 DIAGNOSIS — Z992 Dependence on renal dialysis: Secondary | ICD-10-CM | POA: Diagnosis not present

## 2022-10-29 DIAGNOSIS — I10 Essential (primary) hypertension: Secondary | ICD-10-CM

## 2022-10-29 DIAGNOSIS — N186 End stage renal disease: Secondary | ICD-10-CM | POA: Diagnosis not present

## 2022-10-29 DIAGNOSIS — E782 Mixed hyperlipidemia: Secondary | ICD-10-CM

## 2022-10-29 NOTE — Progress Notes (Signed)
Primary Physician/Referring:  Donell Beers, FNP  Patient ID: Rachael Burke, female    DOB: [birth year], 67 y.o.   MRN: [id number]  Chief Complaint  Patient presents with   Hypertension   Follow-up    HPI:    Rachael Burke  is a 67 y.o. female with past medical history significant for hypertension, hyperlipidemia, and end-stage renal disease who is here for follow-up visit. Her blood pressure is very well controlled on this regiment but this morning it is quite high since she has not taken her medications yet. At a separate visit a few days ago, her blood pressure was within range. She likes to take her medications with food otherwise she feels nauseous. Daughter states her BP has been running very well when she takes her meds. Patient feels well overall. At this time she denies chest pain, shortness of breath, palpitations, diaphoresis, syncope, PND, edema, orthopnea.  Past Medical History:  Diagnosis Date   Anemia    Anxiety    Arthritis    Diabetes mellitus without complication (HCC)    type 2   Heart murmur    echo 07/02/20: Mild MR/TR, mild-moderate AV sclerosis, bicuspid or functional bicuspid AV, no evidence of AS. Murmr felt due to AV.   History of blood transfusion    Hyperlipidemia    Hypertension    Pneumonia    PONV (postoperative nausea and vomiting)    Renal disorder    M-W-F   Past Surgical History:  Procedure Laterality Date   AV FISTULA PLACEMENT Left 05/20/2020   Procedure: INSERTION OF LEFT ARM ARTERIOVENOUS (AV) FISTULA;  Surgeon: Cephus Shelling, MD;  Location: [facility] OR;  Service: Vascular;  Laterality: Left;   BASCILIC VEIN TRANSPOSITION Left 08/05/2020   Procedure: SECOND STAGE BASILIC VEIN TRANSPOSITION;  Surgeon: Cephus Shelling, MD;  Location: [facility] OR;  Service: Vascular;  Laterality: Left;   BIOPSY  09/22/2021   Procedure: BIOPSY;  Surgeon: Meryl Dare, MD;  Location: [facility] ENDOSCOPY;  Service: Gastroenterology;;    BLADDER REPAIR     nov 2023   CESAREAN SECTION     CHOLECYSTECTOMY     ESOPHAGOGASTRODUODENOSCOPY (EGD) WITH PROPOFOL N/A 09/22/2021   Procedure: ESOPHAGOGASTRODUODENOSCOPY (EGD) WITH PROPOFOL;  Surgeon: Meryl Dare, MD;  Location: [facility] ENDOSCOPY;  Service: Gastroenterology;  Laterality: N/A;   INSERTION OF DIALYSIS CATHETER N/A 05/20/2020   Procedure: INSERTION OF DIALYSIS CATHETER;  Surgeon: Cephus Shelling, MD;  Location: [facility] OR;  Service: Vascular;  Laterality: N/A;   VAGINAL HYSTERECTOMY     nov 2023   VIDEO BRONCHOSCOPY Bilateral 09/26/2021   Procedure: VIDEO BRONCHOSCOPY WITHOUT FLUORO;  Surgeon: Lorin Glass, MD;  Location: [facility] ENDOSCOPY;  Service: Pulmonary;  Laterality: Bilateral;   Family History  Problem Relation Age of Onset   Diabetes Mother    Hypertension Father    Diabetes Sister    Diabetes Brother    Diabetes Brother     Social History   Tobacco Use   Smoking status: Never   Smokeless tobacco: Never  Substance Use Topics   Alcohol use: Never   Marital Status: Married  ROS  Review of Systems  Constitutional: Negative.  Cardiovascular: Negative.  Negative for chest pain, dyspnea on exertion, leg swelling, orthopnea and palpitations.  Respiratory: Negative.    Gastrointestinal: Negative.   Neurological: Negative.    Objective  Blood pressure (!) 187/80, pulse 76, height 5' (1.524 m), weight 148 lb (67.1 kg), SpO2 100 %.  Body mass index is 28.9 kg/m.     10/29/2022    9:48 AM 10/29/2022    9:40 AM 10/28/2022   12:00 AM  Vitals with BMI  Height  5\' 0"    Weight  148 lbs   BMI  28.9   Systolic 187 190 161  Diastolic 80 84 71  Pulse 76 73 74     Physical Exam Vitals reviewed.  Constitutional:      Appearance: Normal appearance.  HENT:     Head: Normocephalic and atraumatic.  Neck:     Vascular: No carotid bruit.  Cardiovascular:     Rate and Rhythm: Normal rate and regular rhythm.     Pulses: Normal pulses.     Heart sounds: Murmur  heard.     No friction rub. No gallop.  Pulmonary:     Effort: Pulmonary effort is normal.     Breath sounds: Normal breath sounds.  Abdominal:     General: Abdomen is flat. Bowel sounds are normal.     Palpations: Abdomen is soft.  Musculoskeletal:     Right lower leg: No edema.     Left lower leg: No edema.  Skin:    General: Skin is warm and dry.  Neurological:     Mental Status: She is alert.    Medications and allergies   Allergies  Allergen Reactions   Ceftriaxone      Medication list after today's encounter   Current Outpatient Medications:    acetaminophen (TYLENOL) 325 MG tablet, Take 2 tablets (650 mg total) by mouth every 6 (six) hours as needed for mild pain or moderate pain., Disp: 30 tablet, Rfl: 0   amLODipine (NORVASC) 10 MG tablet, Take 1 tablet by mouth daily., Disp: , Rfl:    calcium acetate (PHOSLO) 667 MG capsule, Take 667 mg by mouth 3 (three) times daily., Disp: , Rfl:    carvedilol (COREG) 12.5 MG tablet, Take 1 tablet (12.5 mg total) by mouth 2 (two) times daily., Disp: 180 tablet, Rfl: 3   gabapentin (NEURONTIN) 100 MG capsule, Take 300 mg by mouth at bedtime., Disp: , Rfl:    HYDROcodone-acetaminophen (NORCO/VICODIN) 5-325 MG tablet, Take 1 tablet by mouth every 6 (six) hours as needed for severe pain., Disp: 10 tablet, Rfl: 0   isosorbide-hydrALAZINE (BIDIL) 20-37.5 MG tablet, Take 1 tablet by mouth 3 (three) times daily., Disp: 90 tablet, Rfl: 2   meclizine (ANTIVERT) 12.5 MG tablet, Take 1 tablet (12.5 mg total) by mouth 2 (two) times daily as needed for dizziness., Disp: 60 tablet, Rfl: 1   nitroGLYCERIN (NITROSTAT) 0.4 MG SL tablet, Place 1 tablet (0.4 mg total) under the tongue every 5 (five) minutes as needed for chest pain., Disp: 90 tablet, Rfl: 3   omeprazole (PRILOSEC OTC) 20 MG tablet, Take 20 mg by mouth daily as needed (indigestion)., Disp: , Rfl:    ondansetron (ZOFRAN) 4 MG tablet, Take 1 tablet (4 mg total) by mouth every 8 (eight)  hours as needed for nausea or vomiting., Disp: 12 tablet, Rfl: 0   oxyCODONE (ROXICODONE) 5 MG immediate release tablet, Take 1 tablet (5 mg total) by mouth every 6 (six) hours as needed for up to 10 doses for breakthrough pain., Disp: 10 tablet, Rfl: 0   polyethylene glycol (MIRALAX / GLYCOLAX) 17 g packet, Take 17 g by mouth daily., Disp: , Rfl:   Laboratory examination:   Lab Results  Component Value Date   NA 134 (L) 10/27/2022  K 6.2 (H) 10/27/2022   CO2 24 10/27/2022   GLUCOSE 135 (H) 10/27/2022   BUN 42 (H) 10/27/2022   CREATININE 6.71 (H) 10/27/2022   CALCIUM 8.8 (L) 10/27/2022   GFRNONAA 6 (L) 10/27/2022       Latest Ref Rng & Units 10/27/2022   10:03 PM 10/26/2022    8:51 PM 10/07/2022   12:48 PM  CMP  Glucose 70 - 99 mg/dL 409  811  93   BUN 8 - 23 mg/dL 42  28  19   Creatinine 0.44 - 1.00 mg/dL 9.14  7.82  9.56   Sodium 135 - 145 mmol/L 134  138  136   Potassium 3.5 - 5.1 mmol/L 6.2  4.7  3.3   Chloride 98 - 111 mmol/L 96  96  94   CO2 22 - 32 mmol/L 24  29  29    Calcium 8.9 - 10.3 mg/dL 8.8  8.7  8.5   Total Protein 6.5 - 8.1 g/dL 7.8  8.3  7.5   Total Bilirubin 0.3 - 1.2 mg/dL 0.7  0.4  0.5   Alkaline Phos 38 - 126 U/L 111  121  110   AST 15 - 41 U/L 17  17  15    ALT 0 - 44 U/L 10  9  10        Latest Ref Rng & Units 10/27/2022   10:03 PM 10/26/2022    8:51 PM 10/07/2022   12:48 PM  CBC  WBC 4.0 - 10.5 K/uL 7.9  6.9  8.9   Hemoglobin 12.0 - 15.0 g/dL 21.3  08.6  57.8   Hematocrit 36.0 - 46.0 % 33.7  32.9  33.0   Platelets 150 - 400 K/uL 159  151  161     Lipid Panel Recent Labs    07/14/22 0952  CHOL 164  TRIG 115  LDLCALC 90  HDL 53  CHOLHDL 3.1    HEMOGLOBIN A1C Lab Results  Component Value Date   HGBA1C 5.0 07/14/2022   MPG 82.45 09/21/2021   TSH Recent Labs    06/26/22 1515  TSH 2.67    External labs:     Radiology:    Cardiac Studies:   Echocardiogram 02/16/2022: Normal LV systolic function with visual EF 60-65%. Left  ventricle cavity is normal in size. Mild concentric hypertrophy of the left ventricle. Normal global wall motion. Doppler evidence of grade II (pseudonormal) diastolic dysfunction, elevated LAP. Calculated EF 71%. Left atrial cavity is moderately dilated at 44.1 ml/m^2. Trileaflet aortic valve with no regurgitation. Mild aortic valve leaflet calcification. Structurally normal tricuspid valve.  Mild tricuspid regurgitation. No evidence of pulmonary hypertension. RVSP measures 33 mmHg. IVC is dilated with respiratory variation. no prior available for comparison.    Lexiscan (with Mod Bruce protocol) Nuclear stress test 02/10/22 Myocardial perfusion is normal. Significant gut uptake present. Overall LV systolic function is normal without regional wall motion abnormalities. Stress LV EF: visually is 50%. Low risk study. Nondiagnostic ECG stress. The heart rate response was consistent with Regadenoson. The blood pressure response was physiologic. No previous exam available for comparison.    EKG:   01/09/2022 EKG normal sinus rhythm with LVH  Assessment     ICD-10-CM   1. ESRD (end stage renal disease) on dialysis (HCC)  N18.6    Z99.2     2. Mixed hyperlipidemia  E78.2     3. Essential hypertension  I10        No orders of the  defined types were placed in this encounter.   No orders of the defined types were placed in this encounter.   Medications Discontinued During This Encounter  Medication Reason   ondansetron (ZOFRAN-ODT) 4 MG disintegrating tablet Duplicate   ondansetron (ZOFRAN-ODT) 4 MG disintegrating tablet Duplicate   olopatadine (PATANOL) 0.1 % ophthalmic solution Completed Course      Recommendations:   Rachael Burke is a 67 y.o. female with hypertension and end-stage renal disease  ESRD (end stage renal disease) on dialysis [facility]) Following with nephrology regularly   Mixed hyperlipidemia Continue statin PCP following lipids   Essential  hypertension Continue current cardiac medications BP elevated today as she has not taken her meds yet On 10/26/2022 her BP was 139/75 Encourage low-sodium diet, less than 2000 mg daily.   Follow-up in 6 months or sooner if needed.      Clotilde Dieter, DO  10/29/2022, 10:44 AM Office: [phone] Pager: [phone]

## 2022-10-30 DIAGNOSIS — N186 End stage renal disease: Secondary | ICD-10-CM | POA: Diagnosis not present

## 2022-10-30 DIAGNOSIS — E1122 Type 2 diabetes mellitus with diabetic chronic kidney disease: Secondary | ICD-10-CM | POA: Diagnosis not present

## 2022-10-30 DIAGNOSIS — R52 Pain, unspecified: Secondary | ICD-10-CM | POA: Diagnosis not present

## 2022-10-30 DIAGNOSIS — D631 Anemia in chronic kidney disease: Secondary | ICD-10-CM | POA: Diagnosis not present

## 2022-10-30 DIAGNOSIS — Z992 Dependence on renal dialysis: Secondary | ICD-10-CM | POA: Diagnosis not present

## 2022-10-30 DIAGNOSIS — E1129 Type 2 diabetes mellitus with other diabetic kidney complication: Secondary | ICD-10-CM | POA: Diagnosis not present

## 2022-10-30 DIAGNOSIS — N2581 Secondary hyperparathyroidism of renal origin: Secondary | ICD-10-CM | POA: Diagnosis not present

## 2022-10-30 DIAGNOSIS — D509 Iron deficiency anemia, unspecified: Secondary | ICD-10-CM | POA: Diagnosis not present

## 2022-11-02 DIAGNOSIS — D631 Anemia in chronic kidney disease: Secondary | ICD-10-CM | POA: Diagnosis not present

## 2022-11-02 DIAGNOSIS — N186 End stage renal disease: Secondary | ICD-10-CM | POA: Diagnosis not present

## 2022-11-02 DIAGNOSIS — Z992 Dependence on renal dialysis: Secondary | ICD-10-CM | POA: Diagnosis not present

## 2022-11-02 DIAGNOSIS — N2581 Secondary hyperparathyroidism of renal origin: Secondary | ICD-10-CM | POA: Diagnosis not present

## 2022-11-02 DIAGNOSIS — D509 Iron deficiency anemia, unspecified: Secondary | ICD-10-CM | POA: Diagnosis not present

## 2022-11-04 DIAGNOSIS — D631 Anemia in chronic kidney disease: Secondary | ICD-10-CM | POA: Diagnosis not present

## 2022-11-04 DIAGNOSIS — N2581 Secondary hyperparathyroidism of renal origin: Secondary | ICD-10-CM | POA: Diagnosis not present

## 2022-11-04 DIAGNOSIS — N186 End stage renal disease: Secondary | ICD-10-CM | POA: Diagnosis not present

## 2022-11-04 DIAGNOSIS — Z992 Dependence on renal dialysis: Secondary | ICD-10-CM | POA: Diagnosis not present

## 2022-11-04 DIAGNOSIS — D509 Iron deficiency anemia, unspecified: Secondary | ICD-10-CM | POA: Diagnosis not present

## 2022-11-06 DIAGNOSIS — D509 Iron deficiency anemia, unspecified: Secondary | ICD-10-CM | POA: Diagnosis not present

## 2022-11-06 DIAGNOSIS — Z992 Dependence on renal dialysis: Secondary | ICD-10-CM | POA: Diagnosis not present

## 2022-11-06 DIAGNOSIS — N2581 Secondary hyperparathyroidism of renal origin: Secondary | ICD-10-CM | POA: Diagnosis not present

## 2022-11-06 DIAGNOSIS — D631 Anemia in chronic kidney disease: Secondary | ICD-10-CM | POA: Diagnosis not present

## 2022-11-06 DIAGNOSIS — N186 End stage renal disease: Secondary | ICD-10-CM | POA: Diagnosis not present

## 2022-11-06 DIAGNOSIS — E113591 Type 2 diabetes mellitus with proliferative diabetic retinopathy without macular edema, right eye: Secondary | ICD-10-CM | POA: Diagnosis not present

## 2022-11-09 DIAGNOSIS — N2581 Secondary hyperparathyroidism of renal origin: Secondary | ICD-10-CM | POA: Diagnosis not present

## 2022-11-09 DIAGNOSIS — N186 End stage renal disease: Secondary | ICD-10-CM | POA: Diagnosis not present

## 2022-11-09 DIAGNOSIS — D631 Anemia in chronic kidney disease: Secondary | ICD-10-CM | POA: Diagnosis not present

## 2022-11-09 DIAGNOSIS — Z992 Dependence on renal dialysis: Secondary | ICD-10-CM | POA: Diagnosis not present

## 2022-11-09 DIAGNOSIS — D509 Iron deficiency anemia, unspecified: Secondary | ICD-10-CM | POA: Diagnosis not present

## 2022-11-10 ENCOUNTER — Ambulatory Visit: Payer: Self-pay | Admitting: Nurse Practitioner

## 2022-11-10 DIAGNOSIS — E1142 Type 2 diabetes mellitus with diabetic polyneuropathy: Secondary | ICD-10-CM | POA: Diagnosis not present

## 2022-11-10 DIAGNOSIS — R0602 Shortness of breath: Secondary | ICD-10-CM | POA: Diagnosis not present

## 2022-11-10 DIAGNOSIS — E785 Hyperlipidemia, unspecified: Secondary | ICD-10-CM | POA: Diagnosis not present

## 2022-11-10 DIAGNOSIS — K660 Peritoneal adhesions (postprocedural) (postinfection): Secondary | ICD-10-CM | POA: Diagnosis not present

## 2022-11-10 DIAGNOSIS — R944 Abnormal results of kidney function studies: Secondary | ICD-10-CM | POA: Diagnosis not present

## 2022-11-10 DIAGNOSIS — D631 Anemia in chronic kidney disease: Secondary | ICD-10-CM | POA: Diagnosis not present

## 2022-11-10 DIAGNOSIS — Z9049 Acquired absence of other specified parts of digestive tract: Secondary | ICD-10-CM | POA: Diagnosis not present

## 2022-11-10 DIAGNOSIS — Z940 Kidney transplant status: Secondary | ICD-10-CM

## 2022-11-10 DIAGNOSIS — D84821 Immunodeficiency due to drugs: Secondary | ICD-10-CM | POA: Diagnosis not present

## 2022-11-10 DIAGNOSIS — E875 Hyperkalemia: Secondary | ICD-10-CM | POA: Diagnosis not present

## 2022-11-10 DIAGNOSIS — N186 End stage renal disease: Secondary | ICD-10-CM | POA: Diagnosis not present

## 2022-11-10 DIAGNOSIS — Z881 Allergy status to other antibiotic agents status: Secondary | ICD-10-CM | POA: Diagnosis not present

## 2022-11-10 DIAGNOSIS — T8619 Other complication of kidney transplant: Secondary | ICD-10-CM | POA: Diagnosis not present

## 2022-11-10 DIAGNOSIS — E871 Hypo-osmolality and hyponatremia: Secondary | ICD-10-CM | POA: Diagnosis not present

## 2022-11-10 DIAGNOSIS — D62 Acute posthemorrhagic anemia: Secondary | ICD-10-CM | POA: Diagnosis not present

## 2022-11-10 DIAGNOSIS — A498 Other bacterial infections of unspecified site: Secondary | ICD-10-CM | POA: Diagnosis not present

## 2022-11-10 DIAGNOSIS — E1122 Type 2 diabetes mellitus with diabetic chronic kidney disease: Secondary | ICD-10-CM | POA: Diagnosis not present

## 2022-11-10 DIAGNOSIS — E119 Type 2 diabetes mellitus without complications: Secondary | ICD-10-CM | POA: Diagnosis not present

## 2022-11-10 DIAGNOSIS — I10 Essential (primary) hypertension: Secondary | ICD-10-CM | POA: Diagnosis not present

## 2022-11-10 DIAGNOSIS — K5900 Constipation, unspecified: Secondary | ICD-10-CM | POA: Diagnosis not present

## 2022-11-10 DIAGNOSIS — Z01810 Encounter for preprocedural cardiovascular examination: Secondary | ICD-10-CM | POA: Diagnosis not present

## 2022-11-10 DIAGNOSIS — R1084 Generalized abdominal pain: Secondary | ICD-10-CM | POA: Diagnosis not present

## 2022-11-10 DIAGNOSIS — Z4822 Encounter for aftercare following kidney transplant: Secondary | ICD-10-CM | POA: Diagnosis not present

## 2022-11-10 DIAGNOSIS — Z7984 Long term (current) use of oral hypoglycemic drugs: Secondary | ICD-10-CM | POA: Diagnosis not present

## 2022-11-10 DIAGNOSIS — Z992 Dependence on renal dialysis: Secondary | ICD-10-CM | POA: Diagnosis not present

## 2022-11-10 DIAGNOSIS — R9431 Abnormal electrocardiogram [ECG] [EKG]: Secondary | ICD-10-CM | POA: Diagnosis not present

## 2022-11-10 DIAGNOSIS — Z79899 Other long term (current) drug therapy: Secondary | ICD-10-CM | POA: Diagnosis not present

## 2022-11-10 DIAGNOSIS — D696 Thrombocytopenia, unspecified: Secondary | ICD-10-CM | POA: Diagnosis not present

## 2022-11-10 HISTORY — DX: Kidney transplant status: Z94.0

## 2022-11-11 DIAGNOSIS — T8619 Other complication of kidney transplant: Secondary | ICD-10-CM | POA: Diagnosis not present

## 2022-11-11 DIAGNOSIS — N186 End stage renal disease: Secondary | ICD-10-CM | POA: Diagnosis not present

## 2022-11-11 DIAGNOSIS — R1084 Generalized abdominal pain: Secondary | ICD-10-CM | POA: Diagnosis not present

## 2022-11-11 DIAGNOSIS — Z940 Kidney transplant status: Secondary | ICD-10-CM | POA: Diagnosis not present

## 2022-11-11 DIAGNOSIS — Z4822 Encounter for aftercare following kidney transplant: Secondary | ICD-10-CM | POA: Diagnosis not present

## 2022-11-12 DIAGNOSIS — Z940 Kidney transplant status: Secondary | ICD-10-CM | POA: Diagnosis not present

## 2022-11-12 DIAGNOSIS — N186 End stage renal disease: Secondary | ICD-10-CM | POA: Diagnosis not present

## 2022-11-12 DIAGNOSIS — Z01810 Encounter for preprocedural cardiovascular examination: Secondary | ICD-10-CM | POA: Diagnosis not present

## 2022-11-12 DIAGNOSIS — R944 Abnormal results of kidney function studies: Secondary | ICD-10-CM | POA: Diagnosis not present

## 2022-11-13 DIAGNOSIS — N186 End stage renal disease: Secondary | ICD-10-CM | POA: Diagnosis not present

## 2022-11-14 DIAGNOSIS — N186 End stage renal disease: Secondary | ICD-10-CM | POA: Diagnosis not present

## 2022-11-15 DIAGNOSIS — N186 End stage renal disease: Secondary | ICD-10-CM | POA: Diagnosis not present

## 2022-11-15 DIAGNOSIS — Z940 Kidney transplant status: Secondary | ICD-10-CM | POA: Diagnosis not present

## 2022-11-16 DIAGNOSIS — A498 Other bacterial infections of unspecified site: Secondary | ICD-10-CM | POA: Diagnosis not present

## 2022-11-16 DIAGNOSIS — Z4822 Encounter for aftercare following kidney transplant: Secondary | ICD-10-CM | POA: Diagnosis not present

## 2022-11-16 DIAGNOSIS — E119 Type 2 diabetes mellitus without complications: Secondary | ICD-10-CM | POA: Diagnosis not present

## 2022-11-16 DIAGNOSIS — Z940 Kidney transplant status: Secondary | ICD-10-CM | POA: Diagnosis not present

## 2022-11-16 DIAGNOSIS — R0602 Shortness of breath: Secondary | ICD-10-CM | POA: Diagnosis not present

## 2022-11-16 DIAGNOSIS — D62 Acute posthemorrhagic anemia: Secondary | ICD-10-CM | POA: Diagnosis not present

## 2022-11-17 DIAGNOSIS — Z4822 Encounter for aftercare following kidney transplant: Secondary | ICD-10-CM | POA: Diagnosis not present

## 2022-11-17 DIAGNOSIS — E119 Type 2 diabetes mellitus without complications: Secondary | ICD-10-CM | POA: Diagnosis not present

## 2022-11-17 DIAGNOSIS — Z940 Kidney transplant status: Secondary | ICD-10-CM | POA: Diagnosis not present

## 2022-11-18 DIAGNOSIS — N186 End stage renal disease: Secondary | ICD-10-CM | POA: Diagnosis not present

## 2022-11-18 DIAGNOSIS — Z940 Kidney transplant status: Secondary | ICD-10-CM | POA: Diagnosis not present

## 2022-11-18 DIAGNOSIS — Z4822 Encounter for aftercare following kidney transplant: Secondary | ICD-10-CM | POA: Diagnosis not present

## 2022-11-19 DIAGNOSIS — N186 End stage renal disease: Secondary | ICD-10-CM | POA: Diagnosis not present

## 2022-11-19 DIAGNOSIS — Z940 Kidney transplant status: Secondary | ICD-10-CM | POA: Diagnosis not present

## 2022-11-20 ENCOUNTER — Other Ambulatory Visit: Payer: 59

## 2022-11-20 DIAGNOSIS — Z7409 Other reduced mobility: Secondary | ICD-10-CM | POA: Diagnosis not present

## 2022-11-20 DIAGNOSIS — R1084 Generalized abdominal pain: Secondary | ICD-10-CM | POA: Diagnosis not present

## 2022-11-20 DIAGNOSIS — Z940 Kidney transplant status: Secondary | ICD-10-CM | POA: Diagnosis not present

## 2022-11-20 DIAGNOSIS — Z741 Need for assistance with personal care: Secondary | ICD-10-CM | POA: Diagnosis not present

## 2022-11-20 DIAGNOSIS — T8619 Other complication of kidney transplant: Secondary | ICD-10-CM | POA: Diagnosis not present

## 2022-11-20 DIAGNOSIS — Z8616 Personal history of COVID-19: Secondary | ICD-10-CM | POA: Diagnosis not present

## 2022-11-20 DIAGNOSIS — Z7982 Long term (current) use of aspirin: Secondary | ICD-10-CM | POA: Diagnosis not present

## 2022-11-20 DIAGNOSIS — R5381 Other malaise: Secondary | ICD-10-CM | POA: Diagnosis not present

## 2022-11-20 DIAGNOSIS — E878 Other disorders of electrolyte and fluid balance, not elsewhere classified: Secondary | ICD-10-CM | POA: Diagnosis not present

## 2022-11-20 DIAGNOSIS — Z9049 Acquired absence of other specified parts of digestive tract: Secondary | ICD-10-CM | POA: Diagnosis not present

## 2022-11-20 DIAGNOSIS — Z794 Long term (current) use of insulin: Secondary | ICD-10-CM | POA: Diagnosis not present

## 2022-11-20 DIAGNOSIS — M8580 Other specified disorders of bone density and structure, unspecified site: Secondary | ICD-10-CM | POA: Diagnosis not present

## 2022-11-20 DIAGNOSIS — Z4822 Encounter for aftercare following kidney transplant: Secondary | ICD-10-CM | POA: Diagnosis not present

## 2022-11-20 DIAGNOSIS — N186 End stage renal disease: Secondary | ICD-10-CM | POA: Diagnosis not present

## 2022-11-20 DIAGNOSIS — Z992 Dependence on renal dialysis: Secondary | ICD-10-CM | POA: Diagnosis not present

## 2022-11-20 DIAGNOSIS — R112 Nausea with vomiting, unspecified: Secondary | ICD-10-CM | POA: Diagnosis not present

## 2022-11-20 DIAGNOSIS — T380X5A Adverse effect of glucocorticoids and synthetic analogues, initial encounter: Secondary | ICD-10-CM | POA: Diagnosis not present

## 2022-11-20 DIAGNOSIS — Z79621 Long term (current) use of calcineurin inhibitor: Secondary | ICD-10-CM | POA: Diagnosis not present

## 2022-11-20 DIAGNOSIS — E871 Hypo-osmolality and hyponatremia: Secondary | ICD-10-CM | POA: Diagnosis not present

## 2022-11-20 DIAGNOSIS — D62 Acute posthemorrhagic anemia: Secondary | ICD-10-CM | POA: Diagnosis not present

## 2022-11-20 DIAGNOSIS — Z8619 Personal history of other infectious and parasitic diseases: Secondary | ICD-10-CM | POA: Diagnosis not present

## 2022-11-20 DIAGNOSIS — E1122 Type 2 diabetes mellitus with diabetic chronic kidney disease: Secondary | ICD-10-CM | POA: Diagnosis not present

## 2022-11-20 DIAGNOSIS — E119 Type 2 diabetes mellitus without complications: Secondary | ICD-10-CM | POA: Diagnosis not present

## 2022-11-20 DIAGNOSIS — D849 Immunodeficiency, unspecified: Secondary | ICD-10-CM | POA: Diagnosis not present

## 2022-11-20 DIAGNOSIS — D72828 Other elevated white blood cell count: Secondary | ICD-10-CM | POA: Diagnosis not present

## 2022-11-20 DIAGNOSIS — Z5181 Encounter for therapeutic drug level monitoring: Secondary | ICD-10-CM | POA: Diagnosis not present

## 2022-11-20 DIAGNOSIS — R2689 Other abnormalities of gait and mobility: Secondary | ICD-10-CM | POA: Diagnosis not present

## 2022-11-20 DIAGNOSIS — Z789 Other specified health status: Secondary | ICD-10-CM | POA: Diagnosis not present

## 2022-11-21 DIAGNOSIS — Z940 Kidney transplant status: Secondary | ICD-10-CM | POA: Diagnosis not present

## 2022-11-21 IMAGING — DX DG CHEST 1V PORT
1 series · 1 of 1 positions shown · non-contrast
Comparison: 05/28/2020

CLINICAL DATA: Shortness of breath.  Cough.

EXAM:
PORTABLE CHEST 1 VIEW

[chest ap]
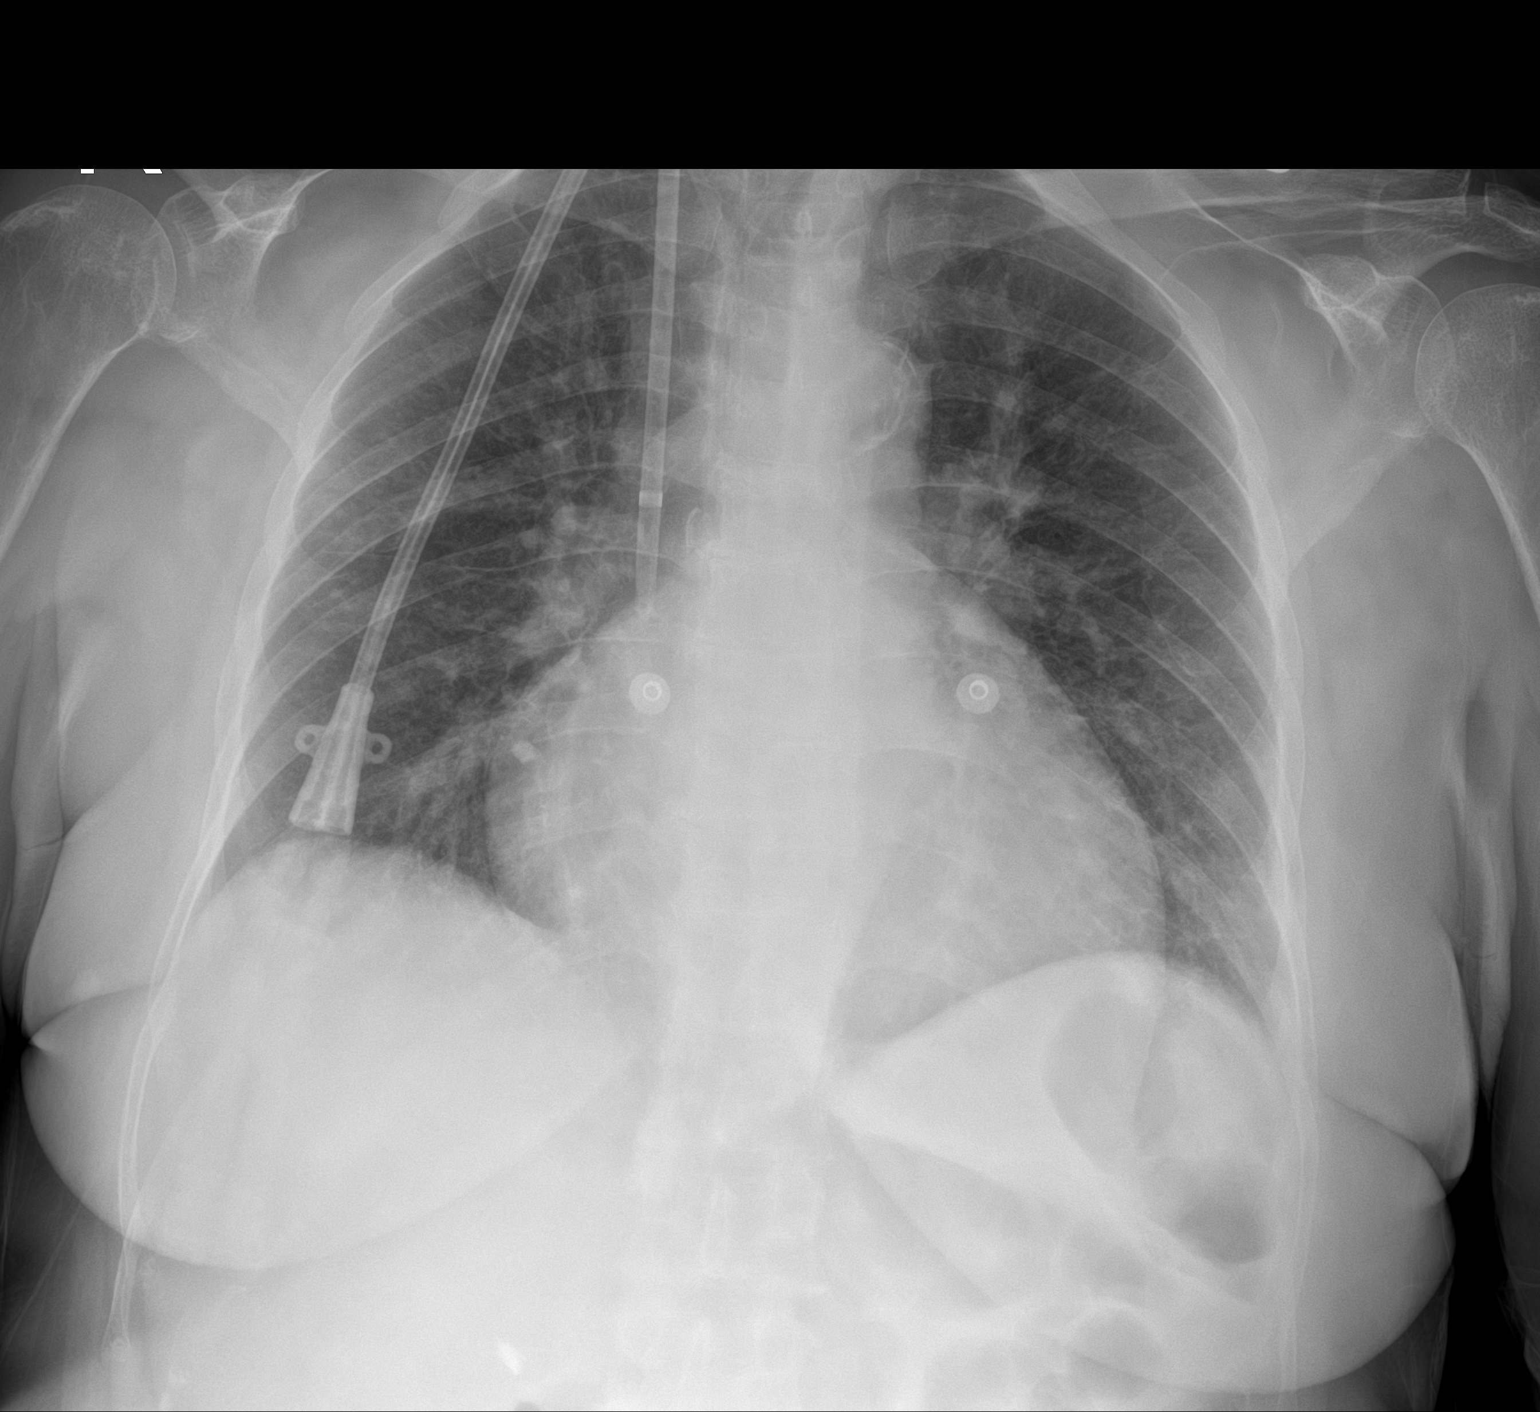

[1 of 1 positions shown; findings below may reference images not displayed]

FINDINGS: Right chest wall dialysis catheter is noted with tip at the
cavoatrial junction. There is aortic atherosclerosis. Stable cardiac
enlargement. There is heterogeneous interstitial opacities within
the periphery of both lungs, nonspecific. No lobar consolidation or
pleural effusion identified.
IMPRESSION: 1. Bilateral heterogeneous interstitial opacities which may reflect
interstitial edema versus multifocal atypical viral infection.
2. Cardiac enlargement and aortic atherosclerosis.

## 2022-11-22 DIAGNOSIS — Z940 Kidney transplant status: Secondary | ICD-10-CM | POA: Diagnosis not present

## 2022-11-23 DIAGNOSIS — Z789 Other specified health status: Secondary | ICD-10-CM | POA: Diagnosis not present

## 2022-11-23 DIAGNOSIS — Z7409 Other reduced mobility: Secondary | ICD-10-CM | POA: Diagnosis not present

## 2022-11-24 DIAGNOSIS — Z789 Other specified health status: Secondary | ICD-10-CM | POA: Diagnosis not present

## 2022-11-24 DIAGNOSIS — Z7409 Other reduced mobility: Secondary | ICD-10-CM | POA: Diagnosis not present

## 2022-11-25 DIAGNOSIS — R112 Nausea with vomiting, unspecified: Secondary | ICD-10-CM | POA: Diagnosis not present

## 2022-11-26 ENCOUNTER — Ambulatory Visit: Payer: 59 | Admitting: "Endocrinology

## 2022-11-26 DIAGNOSIS — Z940 Kidney transplant status: Secondary | ICD-10-CM | POA: Diagnosis not present

## 2022-11-26 DIAGNOSIS — Z7409 Other reduced mobility: Secondary | ICD-10-CM | POA: Diagnosis not present

## 2022-11-26 DIAGNOSIS — Z789 Other specified health status: Secondary | ICD-10-CM | POA: Diagnosis not present

## 2022-11-27 DIAGNOSIS — T8619 Other complication of kidney transplant: Secondary | ICD-10-CM | POA: Diagnosis not present

## 2022-11-27 DIAGNOSIS — Z992 Dependence on renal dialysis: Secondary | ICD-10-CM | POA: Diagnosis not present

## 2022-11-27 DIAGNOSIS — R5381 Other malaise: Secondary | ICD-10-CM | POA: Diagnosis not present

## 2022-11-27 DIAGNOSIS — Z940 Kidney transplant status: Secondary | ICD-10-CM | POA: Diagnosis not present

## 2022-11-27 DIAGNOSIS — D631 Anemia in chronic kidney disease: Secondary | ICD-10-CM | POA: Diagnosis not present

## 2022-11-27 DIAGNOSIS — R300 Dysuria: Secondary | ICD-10-CM | POA: Diagnosis not present

## 2022-11-27 DIAGNOSIS — Z4822 Encounter for aftercare following kidney transplant: Secondary | ICD-10-CM | POA: Diagnosis not present

## 2022-11-27 DIAGNOSIS — Z79621 Long term (current) use of calcineurin inhibitor: Secondary | ICD-10-CM | POA: Diagnosis not present

## 2022-11-27 DIAGNOSIS — Z5181 Encounter for therapeutic drug level monitoring: Secondary | ICD-10-CM | POA: Diagnosis not present

## 2022-11-27 DIAGNOSIS — D72829 Elevated white blood cell count, unspecified: Secondary | ICD-10-CM | POA: Diagnosis not present

## 2022-11-27 DIAGNOSIS — N186 End stage renal disease: Secondary | ICD-10-CM | POA: Diagnosis not present

## 2022-11-30 DIAGNOSIS — D631 Anemia in chronic kidney disease: Secondary | ICD-10-CM | POA: Diagnosis not present

## 2022-11-30 DIAGNOSIS — I129 Hypertensive chronic kidney disease with stage 1 through stage 4 chronic kidney disease, or unspecified chronic kidney disease: Secondary | ICD-10-CM | POA: Diagnosis not present

## 2022-11-30 DIAGNOSIS — N189 Chronic kidney disease, unspecified: Secondary | ICD-10-CM | POA: Diagnosis not present

## 2022-11-30 DIAGNOSIS — Z4822 Encounter for aftercare following kidney transplant: Secondary | ICD-10-CM | POA: Diagnosis not present

## 2022-11-30 DIAGNOSIS — E1121 Type 2 diabetes mellitus with diabetic nephropathy: Secondary | ICD-10-CM | POA: Diagnosis not present

## 2022-11-30 DIAGNOSIS — Z940 Kidney transplant status: Secondary | ICD-10-CM | POA: Diagnosis not present

## 2022-11-30 DIAGNOSIS — E875 Hyperkalemia: Secondary | ICD-10-CM | POA: Diagnosis not present

## 2022-11-30 DIAGNOSIS — D849 Immunodeficiency, unspecified: Secondary | ICD-10-CM | POA: Diagnosis not present

## 2022-11-30 DIAGNOSIS — Z5181 Encounter for therapeutic drug level monitoring: Secondary | ICD-10-CM | POA: Diagnosis not present

## 2022-11-30 DIAGNOSIS — I10 Essential (primary) hypertension: Secondary | ICD-10-CM | POA: Diagnosis not present

## 2022-11-30 DIAGNOSIS — Z79621 Long term (current) use of calcineurin inhibitor: Secondary | ICD-10-CM | POA: Diagnosis not present

## 2022-12-02 DIAGNOSIS — I10 Essential (primary) hypertension: Secondary | ICD-10-CM | POA: Diagnosis not present

## 2022-12-02 DIAGNOSIS — D84821 Immunodeficiency due to drugs: Secondary | ICD-10-CM | POA: Diagnosis not present

## 2022-12-02 DIAGNOSIS — Z79621 Long term (current) use of calcineurin inhibitor: Secondary | ICD-10-CM | POA: Diagnosis not present

## 2022-12-02 DIAGNOSIS — D649 Anemia, unspecified: Secondary | ICD-10-CM | POA: Diagnosis not present

## 2022-12-02 DIAGNOSIS — Z7409 Other reduced mobility: Secondary | ICD-10-CM | POA: Diagnosis not present

## 2022-12-02 DIAGNOSIS — E875 Hyperkalemia: Secondary | ICD-10-CM | POA: Diagnosis not present

## 2022-12-02 DIAGNOSIS — E119 Type 2 diabetes mellitus without complications: Secondary | ICD-10-CM | POA: Diagnosis not present

## 2022-12-02 DIAGNOSIS — Z4822 Encounter for aftercare following kidney transplant: Secondary | ICD-10-CM | POA: Diagnosis not present

## 2022-12-02 DIAGNOSIS — Z7984 Long term (current) use of oral hypoglycemic drugs: Secondary | ICD-10-CM | POA: Diagnosis not present

## 2022-12-07 DIAGNOSIS — Z79621 Long term (current) use of calcineurin inhibitor: Secondary | ICD-10-CM | POA: Diagnosis not present

## 2022-12-07 DIAGNOSIS — E119 Type 2 diabetes mellitus without complications: Secondary | ICD-10-CM | POA: Diagnosis not present

## 2022-12-07 DIAGNOSIS — I10 Essential (primary) hypertension: Secondary | ICD-10-CM | POA: Diagnosis not present

## 2022-12-07 DIAGNOSIS — D849 Immunodeficiency, unspecified: Secondary | ICD-10-CM | POA: Diagnosis not present

## 2022-12-07 DIAGNOSIS — B839 Helminthiasis, unspecified: Secondary | ICD-10-CM | POA: Diagnosis not present

## 2022-12-07 DIAGNOSIS — D84821 Immunodeficiency due to drugs: Secondary | ICD-10-CM | POA: Diagnosis not present

## 2022-12-07 DIAGNOSIS — E1121 Type 2 diabetes mellitus with diabetic nephropathy: Secondary | ICD-10-CM | POA: Diagnosis not present

## 2022-12-07 DIAGNOSIS — Z4822 Encounter for aftercare following kidney transplant: Secondary | ICD-10-CM | POA: Diagnosis not present

## 2022-12-07 DIAGNOSIS — Z5181 Encounter for therapeutic drug level monitoring: Secondary | ICD-10-CM | POA: Diagnosis not present

## 2022-12-07 DIAGNOSIS — E875 Hyperkalemia: Secondary | ICD-10-CM | POA: Diagnosis not present

## 2022-12-07 DIAGNOSIS — N390 Urinary tract infection, site not specified: Secondary | ICD-10-CM | POA: Diagnosis not present

## 2022-12-07 DIAGNOSIS — Z7984 Long term (current) use of oral hypoglycemic drugs: Secondary | ICD-10-CM | POA: Diagnosis not present

## 2022-12-09 DIAGNOSIS — D649 Anemia, unspecified: Secondary | ICD-10-CM | POA: Diagnosis not present

## 2022-12-09 DIAGNOSIS — B839 Helminthiasis, unspecified: Secondary | ICD-10-CM | POA: Diagnosis not present

## 2022-12-09 DIAGNOSIS — Z5181 Encounter for therapeutic drug level monitoring: Secondary | ICD-10-CM | POA: Diagnosis not present

## 2022-12-09 DIAGNOSIS — Z940 Kidney transplant status: Secondary | ICD-10-CM | POA: Diagnosis not present

## 2022-12-09 DIAGNOSIS — I10 Essential (primary) hypertension: Secondary | ICD-10-CM | POA: Diagnosis not present

## 2022-12-09 DIAGNOSIS — Z7984 Long term (current) use of oral hypoglycemic drugs: Secondary | ICD-10-CM | POA: Diagnosis not present

## 2022-12-09 DIAGNOSIS — E1121 Type 2 diabetes mellitus with diabetic nephropathy: Secondary | ICD-10-CM | POA: Diagnosis not present

## 2022-12-09 DIAGNOSIS — D84821 Immunodeficiency due to drugs: Secondary | ICD-10-CM | POA: Diagnosis not present

## 2022-12-09 DIAGNOSIS — Z4822 Encounter for aftercare following kidney transplant: Secondary | ICD-10-CM | POA: Diagnosis not present

## 2022-12-09 DIAGNOSIS — E1165 Type 2 diabetes mellitus with hyperglycemia: Secondary | ICD-10-CM | POA: Diagnosis not present

## 2022-12-09 DIAGNOSIS — Z79621 Long term (current) use of calcineurin inhibitor: Secondary | ICD-10-CM | POA: Diagnosis not present

## 2022-12-09 DIAGNOSIS — D849 Immunodeficiency, unspecified: Secondary | ICD-10-CM | POA: Diagnosis not present

## 2022-12-11 DIAGNOSIS — E1121 Type 2 diabetes mellitus with diabetic nephropathy: Secondary | ICD-10-CM | POA: Diagnosis not present

## 2022-12-11 DIAGNOSIS — Z79899 Other long term (current) drug therapy: Secondary | ICD-10-CM | POA: Diagnosis not present

## 2022-12-11 DIAGNOSIS — N390 Urinary tract infection, site not specified: Secondary | ICD-10-CM | POA: Diagnosis not present

## 2022-12-11 DIAGNOSIS — Z940 Kidney transplant status: Secondary | ICD-10-CM | POA: Diagnosis not present

## 2022-12-11 DIAGNOSIS — I10 Essential (primary) hypertension: Secondary | ICD-10-CM | POA: Diagnosis not present

## 2022-12-11 DIAGNOSIS — B839 Helminthiasis, unspecified: Secondary | ICD-10-CM | POA: Diagnosis not present

## 2022-12-11 DIAGNOSIS — D849 Immunodeficiency, unspecified: Secondary | ICD-10-CM | POA: Diagnosis not present

## 2022-12-17 DIAGNOSIS — T8619 Other complication of kidney transplant: Secondary | ICD-10-CM | POA: Diagnosis not present

## 2022-12-18 DIAGNOSIS — B839 Helminthiasis, unspecified: Secondary | ICD-10-CM | POA: Diagnosis not present

## 2022-12-18 DIAGNOSIS — Z5181 Encounter for therapeutic drug level monitoring: Secondary | ICD-10-CM | POA: Diagnosis not present

## 2022-12-18 DIAGNOSIS — E1142 Type 2 diabetes mellitus with diabetic polyneuropathy: Secondary | ICD-10-CM | POA: Diagnosis not present

## 2022-12-18 DIAGNOSIS — E1121 Type 2 diabetes mellitus with diabetic nephropathy: Secondary | ICD-10-CM | POA: Diagnosis not present

## 2022-12-18 DIAGNOSIS — Z940 Kidney transplant status: Secondary | ICD-10-CM | POA: Diagnosis not present

## 2022-12-18 DIAGNOSIS — D849 Immunodeficiency, unspecified: Secondary | ICD-10-CM | POA: Diagnosis not present

## 2022-12-18 DIAGNOSIS — Z4822 Encounter for aftercare following kidney transplant: Secondary | ICD-10-CM | POA: Diagnosis not present

## 2022-12-18 DIAGNOSIS — Z79621 Long term (current) use of calcineurin inhibitor: Secondary | ICD-10-CM | POA: Diagnosis not present

## 2022-12-22 NOTE — Therapy (Signed)
OUTPATIENT OCCUPATIONAL THERAPY NEURO EVALUATION  Patient Name: Rachael Burke MRN: [id number] DOB:[birth year], 67 y.o., female Today's Date: 12/23/2022  PCP: unknown REFERRING PROVIDER: Addison Naegeli, FNP  END OF SESSION:  OT End of Session - 12/23/22 1525     Visit Number 1    Number of Visits 8    Date for OT Re-Evaluation 01/30/23    Authorization Type [employer] MCR/MCD - Covered 100% per LL    OT Start Time 1230    OT Stop Time 1315    OT Time Calculation (min) 45 min    Activity Tolerance Patient tolerated treatment well    Behavior During Therapy WFL for tasks assessed/performed             Past Medical History:  Diagnosis Date   Anemia    Anxiety    Arthritis    Diabetes mellitus without complication (HCC)    type 2   Heart murmur    echo 07/02/20: Mild MR/TR, mild-moderate AV sclerosis, bicuspid or functional bicuspid AV, no evidence of AS. Murmr felt due to AV.   History of blood transfusion    Hyperlipidemia    Hypertension    Pneumonia    PONV (postoperative nausea and vomiting)    Renal disorder    M-W-F   Past Surgical History:  Procedure Laterality Date   AV FISTULA PLACEMENT Left 05/20/2020   Procedure: INSERTION OF LEFT ARM ARTERIOVENOUS (AV) FISTULA;  Surgeon: Cephus Shelling, MD;  Location: [facility] OR;  Service: Vascular;  Laterality: Left;   BASCILIC VEIN TRANSPOSITION Left 08/05/2020   Procedure: SECOND STAGE BASILIC VEIN TRANSPOSITION;  Surgeon: Cephus Shelling, MD;  Location: [facility] OR;  Service: Vascular;  Laterality: Left;   BIOPSY  09/22/2021   Procedure: BIOPSY;  Surgeon: Meryl Dare, MD;  Location: [facility] ENDOSCOPY;  Service: Gastroenterology;;   BLADDER REPAIR     nov 2023   CESAREAN SECTION     CHOLECYSTECTOMY     ESOPHAGOGASTRODUODENOSCOPY (EGD) WITH PROPOFOL N/A 09/22/2021   Procedure: ESOPHAGOGASTRODUODENOSCOPY (EGD) WITH PROPOFOL;  Surgeon: Meryl Dare, MD;  Location: [facility] ENDOSCOPY;  Service: Gastroenterology;   Laterality: N/A;   INSERTION OF DIALYSIS CATHETER N/A 05/20/2020   Procedure: INSERTION OF DIALYSIS CATHETER;  Surgeon: Cephus Shelling, MD;  Location: [facility] OR;  Service: Vascular;  Laterality: N/A;   VAGINAL HYSTERECTOMY     nov 2023   VIDEO BRONCHOSCOPY Bilateral 09/26/2021   Procedure: VIDEO BRONCHOSCOPY WITHOUT FLUORO;  Surgeon: Lorin Glass, MD;  Location: [facility] ENDOSCOPY;  Service: Pulmonary;  Laterality: Bilateral;   Patient Active Problem List   Diagnosis Date Noted   Osteoporosis without current pathological fracture 09/25/2022   Acute pulmonary edema (HCC) 07/14/2022   Hyperkalemia 06/22/2022   Enterocolitis 06/22/2022   Dizziness 06/05/2022   Gait instability 06/05/2022   Full incontinence of feces 04/06/2022   Neuropathy 04/06/2022   Chronic kidney disease (CKD), stage IV (severe) (HCC) 01/22/2022   Essential hypertension 01/09/2022   Chest pain    Nonintractable headache    Hypertensive urgency 01/02/2022   DOE (dyspnea on exertion) 11/07/2021   Pulmonary nodules 10/13/2021   Right ear pain 10/13/2021   Hematemesis with nausea    Nausea and vomiting    Hemoptysis 09/21/2021   ESRD (end stage renal disease) on dialysis (HCC) 07/23/2020   Generalized anxiety disorder 07/23/2020   Acute renal failure superimposed on chronic kidney disease (HCC) 12/29/2019   Anemia associated with chronic renal failure 12/29/2019  Symptomatic anemia 04/08/2019   Mixed hyperlipidemia 04/08/2019   AKI (acute kidney injury) (HCC) 04/07/2019   Hypertension associated with diabetes (HCC) 04/06/2019   Type 2 diabetes mellitus without obesity (HCC) 01/04/2018    ONSET DATE: 12/04/2022 (referral date)   REFERRING DIAG: Z94.0 (ICD-10-CM) - Kidney transplant status Z78.9 (ICD-10-CM) - Decreased activities of daily living (ADL)  THERAPY DIAG:  Muscle weakness (generalized)  Other lack of coordination  Unsteadiness on feet  Rationale for Evaluation and Treatment:  Rehabilitation  SUBJECTIVE:   SUBJECTIVE STATEMENT: No, except a little on my back Pt accompanied by: interpreter: Alma and daughter  PERTINENT HISTORY: kidney transplant 11/10/22, 67 y.o. Hispanic female with ESRD secondary to T2DM who presented for deceased donor kidney transplant. On hemodialysis at the time of transplant via L AVF  PRECAUTIONS: Other: fistula Lt arm , no heavy lifting  WEIGHT BEARING RESTRICTIONS: No  PAIN:  Are you having pain?  Yes, mild lower abdomen to back  FALLS: Has patient fallen in last 6 months? Yes. Number of falls 2  LIVING ENVIRONMENT: Lives with: lives with their family Lives in: House/apartment, 1 story home, 3 steps to enter Has following equipment at home: Environmental consultant - 2 wheeled, shower chair, and bed side commode  PLOF: Independent  PATIENT GOALS: increase endurance  OBJECTIVE:   HAND DOMINANCE: Right  ADLs:  Eating: independent Grooming: independent but gets tired  UB Dressing: mod I  LB Dressing: mod I  Toileting: independent Bathing: mod I - seated Tub Shower transfers: mod I  Equipment: Shower seat with back  IADLs: Shopping: daughter has always shopped for groceries Light housekeeping: pt doing Pharmacologist (family washing dishes and vacuuming) Meal Prep: mod I - more rest breaks needed Community mobility: pt does not drive Medication management: daughter puts in pillbox Handwriting:  denies change  MOBILITY STATUS: Independent   ACTIVITY TOLERANCE: Activity tolerance: Pt reports standing activity for maybe 30 minutes before needing rest breaks   UPPER EXTREMITY ROM:  BUE AROM WNL's   UPPER EXTREMITY MMT:   BUE MMT grossly 4/5  HAND FUNCTION: Grip strength: Right: 43.8 lbs; Left: 42.5 lbs  COORDINATION: 9 Hole Peg test: Right: 25.22 sec; Left: 27.90 sec  SENSATION: Reports intact  EDEMA: none in UE's   COGNITION: Overall cognitive status:  appears WFL's  VISION: Subjective report: reports ok, does not  wear glasses, cataract sx both eyes    OBSERVATIONS: Pt with decreased overall endurance and decreased ability to perform IADLS   TODAY'S TREATMENT:                                                                                                                               Discussed safety w/ picking up items off floor and/or sweeping and alternative ways to sweep to prevent falls  PATIENT EDUCATION: Education details: OT POC, see above Person educated: Patient Education method: Explanation and VIA interpreter Education comprehension: verbalized understanding  HOME EXERCISE PROGRAM: N/A  GOALS: Goals reviewed with patient? Yes   LONG TERM GOALS: Target date: 01/30/23  Independent with BUE strengthening HEP  Baseline:  Goal status: INITIAL  2.  Pt to return to washing dishes and sweeping/vacuuming for 15 minutes w/o rest and no changes in vitals Baseline:  Goal status: INITIAL   ASSESSMENT:  CLINICAL IMPRESSION: Patient is a 67 y.o. female who was seen today for occupational therapy evaluation for s/p kidney transplant and decreased endurance and ability to perform IADLS. Pt would benefit from short duration of O.T. to increase overall strength, endurance, and participation in IADLS w/ fewer rest breaks required.   PERFORMANCE DEFICITS: in functional skills including IADLs, coordination, strength, mobility, body mechanics, endurance, cardiopulmonary status limiting function, and UE functional use.   IMPAIRMENTS: are limiting patient from IADLs and rest and sleep.   CO-MORBIDITIES: may have co-morbidities  that affects occupational performance. Patient will benefit from skilled OT to address above impairments and improve overall function.  MODIFICATION OR ASSISTANCE TO COMPLETE EVALUATION: No modification of tasks or assist necessary to complete an evaluation.  OT OCCUPATIONAL PROFILE AND HISTORY: Problem focused assessment: Including review of records relating to  presenting problem.  CLINICAL DECISION MAKING: LOW - limited treatment options, no task modification necessary  REHAB POTENTIAL: Good  EVALUATION COMPLEXITY: Low    PLAN:  OT FREQUENCY: 1-2x/week  OT DURATION: 4 weeks  PLANNED INTERVENTIONS: self care/ADL training, therapeutic exercise, therapeutic activity, functional mobility training, patient/family education, and energy conservation  RECOMMENDED OTHER SERVICES: none at this time  CONSULTED AND AGREED WITH PLAN OF CARE: Patient and family member/caregiver  PLAN FOR NEXT SESSION: theraband HEP for UE's   Sheran Lawless, OT 12/23/2022, 3:34 PM

## 2022-12-23 ENCOUNTER — Ambulatory Visit: Payer: 59 | Admitting: Physical Therapy

## 2022-12-23 ENCOUNTER — Ambulatory Visit: Payer: 59 | Admitting: Occupational Therapy

## 2022-12-23 ENCOUNTER — Encounter: Payer: Self-pay | Admitting: Physical Therapy

## 2022-12-23 ENCOUNTER — Other Ambulatory Visit: Payer: Self-pay

## 2022-12-23 VITALS — BP 174/75 | HR 71

## 2022-12-23 DIAGNOSIS — R278 Other lack of coordination: Secondary | ICD-10-CM | POA: Insufficient documentation

## 2022-12-23 DIAGNOSIS — R2681 Unsteadiness on feet: Secondary | ICD-10-CM

## 2022-12-23 DIAGNOSIS — R2689 Other abnormalities of gait and mobility: Secondary | ICD-10-CM | POA: Insufficient documentation

## 2022-12-23 DIAGNOSIS — M6281 Muscle weakness (generalized): Secondary | ICD-10-CM | POA: Diagnosis not present

## 2022-12-23 DIAGNOSIS — Z9181 History of falling: Secondary | ICD-10-CM | POA: Diagnosis not present

## 2022-12-23 NOTE — Therapy (Signed)
OUTPATIENT PHYSICAL THERAPY NEURO EVALUATION   Patient Name: Rachael Burke MRN: [id number] DOB:May 18, 1956, 67 y.o., female Today's Date: 12/23/2022   PCP: Donell Beers, FNP REFERRING PROVIDER: Addison Naegeli, FNP  END OF SESSION:  12/23/22 1326  PT Visits / Re-Eval  Visit Number 1  Number of Visits 7 (6+eval)  Date for PT Re-Evaluation 02/19/23 (pushed out due to scheduling delay)  Authorization  Authorization Type [employer]  Progress Note Due on Visit 10  PT Time Calculation  PT Start Time 1315 (handoff from OT)  PT Stop Time 1402  PT Time Calculation (min) 47 min  PT - End of Session  Equipment Utilized During Treatment Gait belt  Behavior During Therapy WFL for tasks assessed/performed   Past Medical History:  Diagnosis Date   Anemia    Anxiety    Arthritis    Diabetes mellitus without complication (HCC)    type 2   Heart murmur    echo 07/02/20: Mild MR/TR, mild-moderate AV sclerosis, bicuspid or functional bicuspid AV, no evidence of AS. Murmr felt due to AV.   History of blood transfusion    Hyperlipidemia    Hypertension    Pneumonia    PONV (postoperative nausea and vomiting)    Renal disorder    M-W-F   Past Surgical History:  Procedure Laterality Date   AV FISTULA PLACEMENT Left 05/20/2020   Procedure: INSERTION OF LEFT ARM ARTERIOVENOUS (AV) FISTULA;  Surgeon: Cephus Shelling, MD;  Location: [facility] OR;  Service: Vascular;  Laterality: Left;   BASCILIC VEIN TRANSPOSITION Left 08/05/2020   Procedure: SECOND STAGE BASILIC VEIN TRANSPOSITION;  Surgeon: Cephus Shelling, MD;  Location: [facility] OR;  Service: Vascular;  Laterality: Left;   BIOPSY  09/22/2021   Procedure: BIOPSY;  Surgeon: Meryl Dare, MD;  Location: [facility] ENDOSCOPY;  Service: Gastroenterology;;   BLADDER REPAIR     nov 2023   CESAREAN SECTION     CHOLECYSTECTOMY     ESOPHAGOGASTRODUODENOSCOPY (EGD) WITH PROPOFOL N/A 09/22/2021   Procedure:  ESOPHAGOGASTRODUODENOSCOPY (EGD) WITH PROPOFOL;  Surgeon: Meryl Dare, MD;  Location: [facility] ENDOSCOPY;  Service: Gastroenterology;  Laterality: N/A;   INSERTION OF DIALYSIS CATHETER N/A 05/20/2020   Procedure: INSERTION OF DIALYSIS CATHETER;  Surgeon: Cephus Shelling, MD;  Location: [facility] OR;  Service: Vascular;  Laterality: N/A;   VAGINAL HYSTERECTOMY     nov 2023   VIDEO BRONCHOSCOPY Bilateral 09/26/2021   Procedure: VIDEO BRONCHOSCOPY WITHOUT FLUORO;  Surgeon: Lorin Glass, MD;  Location: [facility] ENDOSCOPY;  Service: Pulmonary;  Laterality: Bilateral;   Patient Active Problem List   Diagnosis Date Noted   Osteoporosis without current pathological fracture 09/25/2022   Acute pulmonary edema (HCC) 07/14/2022   Hyperkalemia 06/22/2022   Enterocolitis 06/22/2022   Dizziness 06/05/2022   Gait instability 06/05/2022   Full incontinence of feces 04/06/2022   Neuropathy 04/06/2022   Chronic kidney disease (CKD), stage IV (severe) (HCC) 01/22/2022   Essential hypertension 01/09/2022   Chest pain    Nonintractable headache    Hypertensive urgency 01/02/2022   DOE (dyspnea on exertion) 11/07/2021   Pulmonary nodules 10/13/2021   Right ear pain 10/13/2021   Hematemesis with nausea    Nausea and vomiting    Hemoptysis 09/21/2021   ESRD (end stage renal disease) on dialysis (HCC) 07/23/2020   Generalized anxiety disorder 07/23/2020   Acute renal failure superimposed on chronic kidney disease (HCC) 12/29/2019   Anemia associated with chronic renal failure 12/29/2019  Symptomatic anemia 04/08/2019   Mixed hyperlipidemia 04/08/2019   AKI (acute kidney injury) (HCC) 04/07/2019   Hypertension associated with diabetes (HCC) 04/06/2019   Type 2 diabetes mellitus without obesity (HCC) 01/04/2018    ONSET DATE: 11/10/2022 (transplant date)  REFERRING DIAG: Z94.0 (ICD-10-CM) - Kidney transplant status Z74.09 (ICD-10-CM) - Reduced mobility  THERAPY DIAG:  Unsteadiness on feet  Muscle  weakness (generalized)  Other abnormalities of gait and mobility  History of falling  Rationale for Evaluation and Treatment: Rehabilitation  SUBJECTIVE:                                                                                                                                                                                             SUBJECTIVE STATEMENT: "My feet have been getting tired and my arms."  She has been having higher blood sugar lately (150 in mornings and around 300s at night), but blood pressure has been good at home per report. Pt accompanied by: interpreter: Rachael Burke  PERTINENT HISTORY: DM2, HTN, GAD, s/p kidney transplant  PAIN:  Are you having pain? Yes: NPRS scale: 3/10 Pain location: low back radiating towards midline pelvis Pain description: pricks Aggravating factors: doing something heavier Relieving factors: laying down  PRECAUTIONS: Other: LUE fistula, recent transplant so no heavy lifting  RED FLAGS: None   WEIGHT BEARING RESTRICTIONS: No  FALLS: Has patient fallen in last 6 months? Yes. Number of falls 2 - both prior to transplant  LIVING ENVIRONMENT: Lives with: lives with their family-2 daughters, 81 year old and baby Lives in: House/apartment Stairs: Yes: External: 3 steps; bilateral but cannot reach both Has following equipment at home: Walker - 2 wheeled, shower chair, bed side commode, and Grab bars  PLOF: Independent - only limited by increased pain with activity intermittently  PATIENT GOALS: "endurance on my hands and my feet"  OBJECTIVE:   DIAGNOSTIC FINDINGS: No recent relevant imaging.  COGNITION: Overall cognitive status: Within functional limits for tasks assessed   SENSATION: Light touch: WFL, she reports mild numbness in right hip region diffusely that started after the transplant  COORDINATION: LE RAMS:  patient cannot alternate movement but can beat BLE simultaneously BLE heel-to-shin:  WFL  EDEMA:  None noted in  BLE, pt denies issues with swelling since transplant.  MUSCLE TONE: None noted in BLE.  POSTURE: No Significant postural limitations  LOWER EXTREMITY ROM:     Active  Right Eval Left Eval  Hip flexion All WNL  Hip extension   Hip abduction   Hip adduction   Hip internal rotation   Hip external rotation   Knee flexion   Knee  extension   Ankle dorsiflexion   Ankle plantarflexion   Ankle inversion    Ankle eversion     (Blank rows = not tested)  LOWER EXTREMITY MMT:    MMT Right Eval Left Eval  Hip flexion 4/5 4/5  Hip extension    Hip abduction    Hip adduction    Hip internal rotation    Hip external rotation    Knee flexion    Knee extension 5/5 5/5  Ankle dorsiflexion 5/5 4/5  Ankle plantarflexion    Ankle inversion    Ankle eversion    (Blank rows = not tested)  BED MOBILITY:  Sit to supine Complete Independence Supine to sit Complete Independence Rolling to Right Complete Independence Rolling to Left Complete Independence  TRANSFERS: Assistive device utilized: None  Sit to stand: Complete Independence Stand to sit: Complete Independence Chair to chair: Complete Independence Floor: Min A and Mod A - per report she could help with standing post-fall prior to transplant  GAIT: Gait pattern: WFL Distance walked: various clinic distances Assistive device utilized: None Level of assistance: Complete Independence  FUNCTIONAL TESTS:  5 times sit to stand: To be assessed. 2 minute walk test: To be assessed. Functional gait assessment: To be assessed.  PATIENT SURVEYS:  ABC scale 53.75%  TODAY'S TREATMENT:                                                                                                                              DATE: N/A eval only.   PATIENT EDUCATION: Education details: PT POC and goals to be set.  Encouraged checking blood sugar prior to therapy in future with parameters for safe exercise.  ABC score interpretation and future  assessments to be made.   Person educated: Patient Education method: Explanation Education comprehension: verbalized understanding  HOME EXERCISE PROGRAM: To be initiated.  GOALS: Goals reviewed with patient? Yes  SHORT TERM GOALS: Target date: 01/15/2023  Pt will be provided an introductory strengthening HEP in order to maintain functional progress and improve mobility. Baseline:  To be initiated. Goal status: INITIAL  2.  Patient to be provided a walking program to be completed at least 3 days per week for progressive strengthening and endurance building. Baseline: To be established. Goal status: INITIAL  LONG TERM GOALS: Target date: 02/12/2023  Patient to be compliant with walking program and advanced HEP at least 4 days per week in order to maintain progress made in physical therapy w/o reported increase in low back pain. Baseline: To be established, low back pain 3/10 on eval. Goal status: INITIAL  2.  5xSTS to be assessed w/ LTG set as appropriate. Baseline: To be assessed. Goal status: INITIAL  3.  to be assessed w/ LTG set as appropriate. Baseline: To be assessed. Goal status: INITIAL  4.  FGA to be assessed w/ LTG set as appropriate. Baseline: To be assessed. Goal status: INITIAL  5.  Patient will improve  ABC scale score to >/=63.75% in order to demonstrate improved confidence in her balance. Baseline: 53.75% Goal status: INITIAL  ASSESSMENT:  CLINICAL IMPRESSION: Patient is a 67 y.o. female who was seen today for physical therapy evaluation and treatment for general debility s/p kidney transplant on 11/10/2022.  Pt has a significant PMH of DM2, HTN, and GAD.  Identified impairments include fluctuating blood glucose, mild low back pain, reported R hip numbness, decreased rapid coordination, and mild LE weakness.  Evaluation via the following assessment tools: ABC scale indicates fall risk.  5xSTS and FGA to be assessed next session to address lower extremity  strength and dynamic fall risk.  to be assessed for measure of patient endurance.  She would benefit from skilled PT to address impairments as noted and progress towards long term goals.  OBJECTIVE IMPAIRMENTS: decreased coordination, decreased endurance, decreased strength, impaired sensation, and pain.   ACTIVITY LIMITATIONS: carrying, lifting, and bending  PARTICIPATION LIMITATIONS: community activity  PERSONAL FACTORS: Age, Fitness, Past/current experiences, and 1-2 comorbidities: DM2 and HTN  are also affecting patient's functional outcome.   REHAB POTENTIAL: Excellent  CLINICAL DECISION MAKING: Stable/uncomplicated  EVALUATION COMPLEXITY: Low  PLAN:  PT FREQUENCY: 1x/week  PT DURATION: 6 weeks  PLANNED INTERVENTIONS: Therapeutic exercises, Therapeutic activity, Neuromuscular re-education, Balance training, Gait training, Patient/Family education, Self Care, Stair training, Vestibular training, Manual therapy, and Re-evaluation  PLAN FOR NEXT SESSION: Assess 5xSTS, , and FGA-set LTGs!  Check R BP and inquire about most recent blood sugar.  Initiate strengthening HEP and walking program for endurance.  Sadie Haber, PT, DPT 12/23/2022, 2:09 PM

## 2022-12-25 DIAGNOSIS — E1121 Type 2 diabetes mellitus with diabetic nephropathy: Secondary | ICD-10-CM | POA: Diagnosis not present

## 2022-12-25 DIAGNOSIS — Z79899 Other long term (current) drug therapy: Secondary | ICD-10-CM | POA: Diagnosis not present

## 2022-12-25 DIAGNOSIS — I10 Essential (primary) hypertension: Secondary | ICD-10-CM | POA: Diagnosis not present

## 2022-12-25 DIAGNOSIS — D849 Immunodeficiency, unspecified: Secondary | ICD-10-CM | POA: Diagnosis not present

## 2022-12-25 DIAGNOSIS — Z940 Kidney transplant status: Secondary | ICD-10-CM | POA: Diagnosis not present

## 2023-01-01 DIAGNOSIS — Z79899 Other long term (current) drug therapy: Secondary | ICD-10-CM | POA: Diagnosis not present

## 2023-01-01 DIAGNOSIS — Z5181 Encounter for therapeutic drug level monitoring: Secondary | ICD-10-CM | POA: Diagnosis not present

## 2023-01-01 DIAGNOSIS — Z4822 Encounter for aftercare following kidney transplant: Secondary | ICD-10-CM | POA: Diagnosis not present

## 2023-01-01 DIAGNOSIS — E1121 Type 2 diabetes mellitus with diabetic nephropathy: Secondary | ICD-10-CM | POA: Diagnosis not present

## 2023-01-01 DIAGNOSIS — D849 Immunodeficiency, unspecified: Secondary | ICD-10-CM | POA: Diagnosis not present

## 2023-01-01 DIAGNOSIS — Z79621 Long term (current) use of calcineurin inhibitor: Secondary | ICD-10-CM | POA: Diagnosis not present

## 2023-01-01 DIAGNOSIS — Z940 Kidney transplant status: Secondary | ICD-10-CM | POA: Diagnosis not present

## 2023-01-03 ENCOUNTER — Emergency Department (HOSPITAL_COMMUNITY)
Admission: EM | Admit: 2023-01-03 | Discharge: 2023-01-04 | Disposition: A | Discharge status: Other Acute Inpatient Hospital | Payer: 59 | Attending: Emergency Medicine | Admitting: Emergency Medicine

## 2023-01-03 DIAGNOSIS — I10 Essential (primary) hypertension: Secondary | ICD-10-CM | POA: Diagnosis not present

## 2023-01-03 DIAGNOSIS — I120 Hypertensive chronic kidney disease with stage 5 chronic kidney disease or end stage renal disease: Secondary | ICD-10-CM | POA: Diagnosis not present

## 2023-01-03 DIAGNOSIS — N12 Tubulo-interstitial nephritis, not specified as acute or chronic: Secondary | ICD-10-CM

## 2023-01-03 DIAGNOSIS — Z9049 Acquired absence of other specified parts of digestive tract: Secondary | ICD-10-CM | POA: Diagnosis not present

## 2023-01-03 DIAGNOSIS — E875 Hyperkalemia: Secondary | ICD-10-CM | POA: Diagnosis not present

## 2023-01-03 DIAGNOSIS — T8131XA Disruption of external operation (surgical) wound, not elsewhere classified, initial encounter: Secondary | ICD-10-CM | POA: Insufficient documentation

## 2023-01-03 DIAGNOSIS — Z940 Kidney transplant status: Secondary | ICD-10-CM | POA: Insufficient documentation

## 2023-01-03 DIAGNOSIS — N179 Acute kidney failure, unspecified: Secondary | ICD-10-CM | POA: Diagnosis not present

## 2023-01-03 DIAGNOSIS — N1330 Unspecified hydronephrosis: Secondary | ICD-10-CM | POA: Diagnosis not present

## 2023-01-03 DIAGNOSIS — E1122 Type 2 diabetes mellitus with diabetic chronic kidney disease: Secondary | ICD-10-CM | POA: Insufficient documentation

## 2023-01-03 DIAGNOSIS — R300 Dysuria: Secondary | ICD-10-CM | POA: Diagnosis present

## 2023-01-03 DIAGNOSIS — Z79899 Other long term (current) drug therapy: Secondary | ICD-10-CM | POA: Diagnosis not present

## 2023-01-03 DIAGNOSIS — N186 End stage renal disease: Secondary | ICD-10-CM | POA: Insufficient documentation

## 2023-01-04 ENCOUNTER — Other Ambulatory Visit: Payer: Self-pay

## 2023-01-04 ENCOUNTER — Emergency Department (HOSPITAL_COMMUNITY): Payer: 59

## 2023-01-04 ENCOUNTER — Encounter: Payer: 59 | Admitting: Occupational Therapy

## 2023-01-04 ENCOUNTER — Encounter (HOSPITAL_COMMUNITY): Payer: Self-pay

## 2023-01-04 DIAGNOSIS — Z940 Kidney transplant status: Secondary | ICD-10-CM | POA: Diagnosis not present

## 2023-01-04 DIAGNOSIS — Z87440 Personal history of urinary (tract) infections: Secondary | ICD-10-CM | POA: Diagnosis not present

## 2023-01-04 DIAGNOSIS — E872 Acidosis, unspecified: Secondary | ICD-10-CM | POA: Diagnosis not present

## 2023-01-04 DIAGNOSIS — N12 Tubulo-interstitial nephritis, not specified as acute or chronic: Secondary | ICD-10-CM | POA: Diagnosis not present

## 2023-01-04 DIAGNOSIS — N179 Acute kidney failure, unspecified: Secondary | ICD-10-CM | POA: Diagnosis not present

## 2023-01-04 DIAGNOSIS — D631 Anemia in chronic kidney disease: Secondary | ICD-10-CM | POA: Diagnosis not present

## 2023-01-04 DIAGNOSIS — Z4822 Encounter for aftercare following kidney transplant: Secondary | ICD-10-CM | POA: Diagnosis not present

## 2023-01-04 DIAGNOSIS — N189 Chronic kidney disease, unspecified: Secondary | ICD-10-CM | POA: Diagnosis not present

## 2023-01-04 DIAGNOSIS — Z79899 Other long term (current) drug therapy: Secondary | ICD-10-CM | POA: Diagnosis not present

## 2023-01-04 DIAGNOSIS — D84821 Immunodeficiency due to drugs: Secondary | ICD-10-CM | POA: Diagnosis not present

## 2023-01-04 DIAGNOSIS — R300 Dysuria: Secondary | ICD-10-CM | POA: Diagnosis present

## 2023-01-04 DIAGNOSIS — E875 Hyperkalemia: Secondary | ICD-10-CM | POA: Diagnosis not present

## 2023-01-04 DIAGNOSIS — N10 Acute tubulo-interstitial nephritis: Secondary | ICD-10-CM | POA: Diagnosis not present

## 2023-01-04 DIAGNOSIS — Z7984 Long term (current) use of oral hypoglycemic drugs: Secondary | ICD-10-CM | POA: Diagnosis not present

## 2023-01-04 DIAGNOSIS — Z9049 Acquired absence of other specified parts of digestive tract: Secondary | ICD-10-CM | POA: Diagnosis not present

## 2023-01-04 DIAGNOSIS — Z7960 Long term (current) use of unspecified immunomodulators and immunosuppressants: Secondary | ICD-10-CM | POA: Diagnosis not present

## 2023-01-04 DIAGNOSIS — T8131XA Disruption of external operation (surgical) wound, not elsewhere classified, initial encounter: Secondary | ICD-10-CM | POA: Diagnosis not present

## 2023-01-04 DIAGNOSIS — N1330 Unspecified hydronephrosis: Secondary | ICD-10-CM | POA: Diagnosis not present

## 2023-01-04 DIAGNOSIS — E1122 Type 2 diabetes mellitus with diabetic chronic kidney disease: Secondary | ICD-10-CM | POA: Diagnosis not present

## 2023-01-04 DIAGNOSIS — N186 End stage renal disease: Secondary | ICD-10-CM | POA: Diagnosis not present

## 2023-01-04 DIAGNOSIS — T8613 Kidney transplant infection: Secondary | ICD-10-CM | POA: Diagnosis not present

## 2023-01-04 DIAGNOSIS — I120 Hypertensive chronic kidney disease with stage 5 chronic kidney disease or end stage renal disease: Secondary | ICD-10-CM | POA: Diagnosis not present

## 2023-01-04 DIAGNOSIS — D849 Immunodeficiency, unspecified: Secondary | ICD-10-CM | POA: Diagnosis not present

## 2023-01-04 LAB — URINALYSIS, ROUTINE W REFLEX MICROSCOPIC
Bilirubin Urine: NEGATIVE
Glucose, UA: 150 mg/dL — AB
Hgb urine dipstick: NEGATIVE
Ketones, ur: NEGATIVE mg/dL
Nitrite: NEGATIVE
Protein, ur: NEGATIVE mg/dL
Specific Gravity, Urine: 1.004 — ABNORMAL LOW (ref 1.005–1.030)
pH: 6 (ref 5.0–8.0)

## 2023-01-04 LAB — COMPREHENSIVE METABOLIC PANEL
ALT: 13 U/L (ref 0–44)
AST: 14 U/L — ABNORMAL LOW (ref 15–41)
Albumin: 4 g/dL (ref 3.5–5.0)
Alkaline Phosphatase: 90 U/L (ref 38–126)
Anion gap: 8 (ref 5–15)
BUN: 31 mg/dL — ABNORMAL HIGH (ref 8–23)
CO2: 18 mmol/L — ABNORMAL LOW (ref 22–32)
Calcium: 8.8 mg/dL — ABNORMAL LOW (ref 8.9–10.3)
Chloride: 104 mmol/L (ref 98–111)
Creatinine, Ser: 1.45 mg/dL — ABNORMAL HIGH (ref 0.44–1.00)
GFR, Estimated: 40 mL/min — ABNORMAL LOW (ref >60)
Glucose, Bld: 269 mg/dL — ABNORMAL HIGH (ref 70–99)
Potassium: 5.5 mmol/L — ABNORMAL HIGH (ref 3.5–5.1)
Sodium: 130 mmol/L — ABNORMAL LOW (ref 135–145)
Total Bilirubin: 0.3 mg/dL (ref 0.3–1.2)
Total Protein: 6.9 g/dL (ref 6.5–8.1)

## 2023-01-04 LAB — CBC WITH DIFFERENTIAL/PLATELET
Abs Immature Granulocytes: 0.05 10*3/uL (ref 0.00–0.07)
Basophils Absolute: 0 10*3/uL (ref 0.0–0.1)
Basophils Relative: 0 %
Eosinophils Absolute: 0 10*3/uL (ref 0.0–0.5)
Eosinophils Relative: 0 %
HCT: 26.4 % — ABNORMAL LOW (ref 36.0–46.0)
Hemoglobin: 8.6 g/dL — ABNORMAL LOW (ref 12.0–15.0)
Immature Granulocytes: 1 %
Lymphocytes Relative: 5 %
Lymphs Abs: 0.5 10*3/uL — ABNORMAL LOW (ref 0.7–4.0)
MCH: 30.9 pg (ref 26.0–34.0)
MCHC: 32.6 g/dL (ref 30.0–36.0)
MCV: 95 fL (ref 80.0–100.0)
Monocytes Absolute: 0.4 10*3/uL (ref 0.1–1.0)
Monocytes Relative: 4 %
Neutro Abs: 9.6 10*3/uL — ABNORMAL HIGH (ref 1.7–7.7)
Neutrophils Relative %: 90 %
Platelets: 188 10*3/uL (ref 150–400)
RBC: 2.78 MIL/uL — ABNORMAL LOW (ref 3.87–5.11)
RDW: 16.7 % — ABNORMAL HIGH (ref 11.5–15.5)
WBC: 10.5 10*3/uL (ref 4.0–10.5)
nRBC: 0 % (ref 0.0–0.2)

## 2023-01-04 LAB — I-STAT CG4 LACTIC ACID, ED
Lactic Acid, Venous: 1 mmol/L (ref 0.5–1.9)
Lactic Acid, Venous: 1.1 mmol/L (ref 0.5–1.9)

## 2023-01-04 LAB — PROTIME-INR
INR: 1 (ref 0.8–1.2)
Prothrombin Time: 13.4 seconds (ref 11.4–15.2)

## 2023-01-04 MED ORDER — OXYCODONE-ACETAMINOPHEN 5-325 MG PO TABS
1.0000 | ORAL_TABLET | Freq: Once | ORAL | Status: AC
  Administered 2023-01-04: 1 via ORAL
  Filled 2023-01-04: qty 1

## 2023-01-04 MED ORDER — SODIUM ZIRCONIUM CYCLOSILICATE 10 G PO PACK
10.0000 g | PACK | Freq: Once | ORAL | Status: AC
  Administered 2023-01-04: 10 g via ORAL
  Filled 2023-01-04: qty 1

## 2023-01-04 MED ORDER — CIPROFLOXACIN IN D5W 400 MG/200ML IV SOLN
400.0000 mg | Freq: Once | INTRAVENOUS | Status: AC
  Administered 2023-01-04: 400 mg via INTRAVENOUS
  Filled 2023-01-04: qty 200

## 2023-01-04 MED ORDER — SODIUM CHLORIDE 0.9 % IV BOLUS
1000.0000 mL | Freq: Once | INTRAVENOUS | Status: AC
  Administered 2023-01-04: 1000 mL via INTRAVENOUS

## 2023-01-04 MED ORDER — ONDANSETRON 4 MG PO TBDP
4.0000 mg | ORAL_TABLET | Freq: Once | ORAL | Status: AC
  Administered 2023-01-04: 4 mg via ORAL
  Filled 2023-01-04: qty 1

## 2023-01-04 NOTE — ED Notes (Signed)
Pt ambulated to bathroom with assistance.

## 2023-01-04 NOTE — ED Notes (Signed)
Called lab to add urine culture.  

## 2023-01-04 NOTE — ED Triage Notes (Signed)
Pt. Arrives POV c/o dysuria. Pt. Is have burning when she urinates, and when she goes, there isn't much. She recently had a kidney transplant. She endorses abdominal pain and flank pain.

## 2023-01-04 NOTE — ED Provider Notes (Signed)
[place] EMERGENCY DEPARTMENT AT [facility] Provider Note   CSN: [id number] Arrival date & time: 01/03/23  2345     History  Chief Complaint  Patient presents with   Dysuria    Marinus Maw de Roda Shutters is a 67 y.o. female.   Dysuria Associated symptoms: abdominal pain and nausea   Patient presents for abdominal pain and dysuria.  Medical history includes T2DM, HTN, anemia, HLD, ESRD s/p renal transplant, neuropathy.  Transplant was in June at [facility].  She had postoperative bleeding.  She underwent ex lap on 6/12.  She is on Prograf, Myfortic, Bactrim, prednisone, and Valcyte.  She was seen at transplant clinic 3 days ago.  Starting yesterday, she had lower abdominal pain and burning with urination.  She has had nausea.  She denies any fevers or chills.  When she does attempt to urinate, she will urinate a small amount.     Home Medications Prior to Admission medications   Medication Sig Start Date End Date Taking? Authorizing Provider  acetaminophen (TYLENOL) 325 MG tablet Take 2 tablets (650 mg total) by mouth every 6 (six) hours as needed for mild pain or moderate pain. 09/05/21   Renne Crigler, PA-C  amLODipine (NORVASC) 10 MG tablet Take 1 tablet by mouth daily.    [provider]  calcium acetate (PHOSLO) 667 MG capsule Take 667 mg by mouth 3 (three) times daily. 04/02/21   [provider]  carvedilol (COREG) 12.5 MG tablet Take 1 tablet (12.5 mg total) by mouth 2 (two) times daily. 04/29/22 10/29/22  Custovic, Rozell Searing, DO  gabapentin (NEURONTIN) 100 MG capsule Take 300 mg by mouth at bedtime. 09/03/21   [provider]  HYDROcodone-acetaminophen (NORCO/VICODIN) 5-325 MG tablet Take 1 tablet by mouth every 6 (six) hours as needed for severe pain. 10/07/22   Gerhard Munch, MD  isosorbide-hydrALAZINE (BIDIL) 20-37.5 MG tablet Take 1 tablet by mouth 3 (three) times daily. 01/04/22   Lonia Blood, MD  meclizine (ANTIVERT) 12.5 MG tablet  Take 1 tablet (12.5 mg total) by mouth 2 (two) times daily as needed for dizziness. 06/26/22   Antony Madura, MD  nitroGLYCERIN (NITROSTAT) 0.4 MG SL tablet Place 1 tablet (0.4 mg total) under the tongue every 5 (five) minutes as needed for chest pain. 01/09/22 10/29/22  Custovic, Rozell Searing, DO  omeprazole (PRILOSEC OTC) 20 MG tablet Take 20 mg by mouth daily as needed (indigestion).    [provider]  ondansetron (ZOFRAN) 4 MG tablet Take 1 tablet (4 mg total) by mouth every 8 (eight) hours as needed for nausea or vomiting. 10/28/22   Fayrene Helper, PA-C  oxyCODONE (ROXICODONE) 5 MG immediate release tablet Take 1 tablet (5 mg total) by mouth every 6 (six) hours as needed for up to 10 doses for breakthrough pain. 10/26/22   Curatolo, Adam, DO  polyethylene glycol (MIRALAX / GLYCOLAX) 17 g packet Take 17 g by mouth daily.    [provider]      Allergies    Ceftriaxone    Review of Systems   Review of Systems  Gastrointestinal:  Positive for abdominal pain and nausea.  Genitourinary:  Positive for decreased urine volume, difficulty urinating, dysuria and frequency.  All other systems reviewed and are negative.   Physical Exam Updated Vital Signs BP (!) 126/52   Pulse 72   Temp 98.4 F (36.9 C) (Oral)   Resp 14   SpO2 99%  Physical Exam Vitals and nursing note reviewed.  Constitutional:      General: She is not in acute distress.    Appearance: Normal appearance. She is well-developed. She is not ill-appearing, toxic-appearing or diaphoretic.  HENT:     Head: Normocephalic and atraumatic.     Right Ear: External ear normal.     Left Ear: External ear normal.     Nose: Nose normal.  Eyes:     Extraocular Movements: Extraocular movements intact.     Conjunctiva/sclera: Conjunctivae normal.  Cardiovascular:     Rate and Rhythm: Normal rate and regular rhythm.  Pulmonary:     Effort: Pulmonary effort is normal. No respiratory distress.  Abdominal:     General: There  is distension.     Palpations: Abdomen is soft.     Tenderness: There is abdominal tenderness. There is no guarding or rebound.     Comments: Surgical incision in place in right lower quadrant.  Small amount of dehiscence is present.  Musculoskeletal:        General: No swelling. Normal range of motion.     Cervical back: Normal range of motion and neck supple.  Skin:    General: Skin is warm and dry.     Coloration: Skin is not jaundiced or pale.  Neurological:     General: No focal deficit present.     Mental Status: She is alert and oriented to person, place, and time.  Psychiatric:        Mood and Affect: Mood normal.        Behavior: Behavior normal.     ED Results / Procedures / Treatments   Labs (all labs ordered are listed, but only abnormal results are displayed) Labs Reviewed  URINALYSIS, ROUTINE W REFLEX MICROSCOPIC - Abnormal; Notable for the following components:      Result Value   Color, Urine COLORLESS (*)    Specific Gravity, Urine 1.004 (*)    Glucose, UA 150 (*)    Leukocytes,Ua MODERATE (*)    Bacteria, UA FEW (*)    All other components within normal limits  COMPREHENSIVE METABOLIC PANEL - Abnormal; Notable for the following components:   Sodium 130 (*)    Potassium 5.5 (*)    CO2 18 (*)    Glucose, Bld 269 (*)    BUN 31 (*)    Creatinine, Ser 1.45 (*)    Calcium 8.8 (*)    AST 14 (*)    GFR, Estimated 40 (*)    All other components within normal limits  CBC WITH DIFFERENTIAL/PLATELET - Abnormal; Notable for the following components:   RBC 2.78 (*)    Hemoglobin 8.6 (*)    HCT 26.4 (*)    RDW 16.7 (*)    Neutro Abs 9.6 (*)    Lymphs Abs 0.5 (*)    All other components within normal limits  CULTURE, BLOOD (ROUTINE X 2)  CULTURE, BLOOD (ROUTINE X 2)  URINE CULTURE  PROTIME-INR  CBC WITH DIFFERENTIAL/PLATELET  MAGNESIUM  I-STAT CG4 LACTIC ACID, ED  I-STAT CG4 LACTIC ACID, ED    EKG EKG Interpretation Date/Time:  Monday January 04 2023  00:59:57 EDT Ventricular Rate:  71 PR Interval:  195 QRS Duration:  85 QT Interval:  399 QTC Calculation: 434 R Axis:   27  Text Interpretation: Sinus rhythm Confirmed by Gloris Manchester ([id number]) on 01/04/2023 1:44:08 AM  Radiology CT ABDOMEN PELVIS WO CONTRAST  Result Date: 01/04/2023 CLINICAL DATA:  Dysuria. EXAM: CT ABDOMEN AND PELVIS WITHOUT CONTRAST TECHNIQUE: Multidetector  CT imaging of the abdomen and pelvis was performed following the standard protocol without IV contrast. RADIATION DOSE REDUCTION: This exam was performed according to the departmental dose-optimization program which includes automated exposure control, adjustment of the mA and/or kV according to patient size and/or use of iterative reconstruction technique. COMPARISON:  Oct 26, 2022 FINDINGS: Lower chest: No acute abnormality. Hepatobiliary: No focal liver abnormality is seen. Status post cholecystectomy. No biliary dilatation. Pancreas: Unremarkable. No pancreatic ductal dilatation or surrounding inflammatory changes. Spleen: Normal in size without focal abnormality. Adrenals/Urinary Tract: Adrenal glands are unremarkable. The native kidneys are small in size, without renal calculi, focal lesion, or hydronephrosis. A renal transplant is seen within the pelvis on the right. This represents a new finding when compared to the prior study and contains a 3.5 cm x 2.1 cm x 3.4 cm exophytic simple cyst. Mild right-sided hydronephrosis is seen. Mild to moderate severity perinephric and peripelvic inflammatory fat stranding is also noted. Bladder is unremarkable. Stomach/Bowel: Stomach is within normal limits. The appendix is not clearly identified. Stool is seen throughout the large bowel. No evidence of bowel wall thickening, distention, or inflammatory changes. Vascular/Lymphatic: Aortic atherosclerosis. No enlarged abdominal or pelvic lymph nodes. Reproductive: Status post hysterectomy. No adnexal masses. Other: A surgical scar seen along the  anterior aspect of the lower abdominal wall on the right. No abdominopelvic ascites. Musculoskeletal: No acute or significant osseous findings. IMPRESSION: 1. Interval renal transplant within the pelvis on the right with additional findings that may represent acute pyelonephritis and/or partial obstruction of the transplanted kidney. Nephrology consult and correlation with urinalysis is recommended. 2. Evidence of prior cholecystectomy and prior hysterectomy. 3. Aortic atherosclerosis. Aortic Atherosclerosis (ICD10-I70.0). Electronically Signed   By: Aram Candela M.D.   On: 01/04/2023 02:15    Procedures Procedures    Medications Ordered in ED Medications  oxyCODONE-acetaminophen (PERCOCET/ROXICET) 5-325 MG per tablet 1 tablet (1 tablet Oral Given 01/04/23 0122)  ondansetron (ZOFRAN-ODT) disintegrating tablet 4 mg (4 mg Oral Given 01/04/23 0122)  sodium chloride 0.9 % bolus 1,000 mL (0 mLs Intravenous Stopped 01/04/23 0343)  sodium zirconium cyclosilicate (LOKELMA) packet 10 g (10 g Oral Given 01/04/23 0207)  ciprofloxacin (CIPRO) IVPB 400 mg (0 mg Intravenous Stopped 01/04/23 0343)    ED Course/ Medical Decision Making/ A&P                                 Medical Decision Making Amount and/or Complexity of Data Reviewed Labs: ordered. Radiology: ordered. ECG/medicine tests: ordered.  Risk Prescription drug management.   This patient presents to the ED for concern of abdominal pain and dysuria, this involves an extensive number of treatment options, and is a complaint that carries with it a high risk of complications and morbidity.  The differential diagnosis includes cystitis, pyelonephritis, postoperative infection, constipation, colitis   Co morbidities that complicate the patient evaluation  T2DM, HTN, anemia, HLD, ESRD s/p renal transplant, neuropathy   Additional history obtained:  Additional history obtained from patient's daughter External records from outside source  obtained and reviewed including EMR   Lab Tests:  I Ordered, and personally interpreted labs.  The pertinent results include: AKI is present.  Mild hyperkalemia is present.  Non-anion gap metabolic acidosis appears to be baseline.  Urinalysis shows bacteria and pyuria consistent with infection.  No leukocytosis is present, however, patient is immunosuppressed.   Imaging Studies ordered:  I ordered imaging studies including  CT of abdomen and pelvis I independently visualized and interpreted imaging which showed mild hydronephrosis with moderate perinephric and peripelvic inflammatory fat stranding. I agree with the radiologist interpretation   Cardiac Monitoring: / EKG:  The patient was maintained on a cardiac monitor.  I personally viewed and interpreted the cardiac monitored which showed an underlying rhythm of: Sinus rhythm   Consultations Obtained:  I requested consultation with the [facility] transplant surgeon, Dr. Jefm Miles,  and discussed lab and imaging findings as well as pertinent plan - they recommend: Admission to [facility] transplant service   Problem List / ED Course / Critical interventions / Medication management  Patient is a pleasant 67 year old female presenting for dysuria and lower abdominal pain since yesterday.  She is a recipient of a recent renal transplant 2 months ago.  She was doing well 3 days ago and actually had a follow-up appointment with her transplant service at that time.  Starting yesterday, she began to experience dysuria and lower abdominal pain.  On arrival in the ED, patient's vital signs are reassuring, however, she does appear uncomfortable.  She points to her suprapubic area as the area of greatest discomfort.  This is the area on the lower aspect of her recent surgery.  Surgical wound does show some mild dehiscence.  Daughter reports that this is new.  Tenderness is present to this area.  Patient provided urine sample which did show pyuria  and bacteriuria.  She does have an allergy to ceftriaxone.  Ciprofloxacin was ordered.  Further lab work shows a 30% increase in her creatinine from baseline.  IV fluids were ordered for hydration.  On CT imaging, there is concern of pyelonephritis and/for partial obstruction of transplanted kidney.  I discussed this with [facility] transplant surgeon, Dr. Jefm Miles, who does accept the patient for transfer for [facility] to [facility].  Patient had improved pain and nausea while in the ED.  She remained hemodynamically stable. I ordered medication including Percocet for analgesia; Zofran for nausea; IV fluids for hydration; Lokelma for hyperkalemia; ciprofloxacin for UTI Reevaluation of the patient after these medicines showed that the patient improved I have reviewed the patients home medicines and have made adjustments as needed   Social Determinants of Health:  Has access to outpatient care        Final Clinical Impression(s) / ED Diagnoses Final diagnoses:  Pyelonephritis  Hyperkalemia  AKI (acute kidney injury) (HCC)  Transplanted kidney    Rx / DC Orders ED Discharge Orders     None         Gloris Manchester, MD 01/04/23 [phone]

## 2023-01-04 NOTE — ED Notes (Signed)
[facility] and [facility] were bothy contacted.  [facility] transport was set up.

## 2023-01-05 DIAGNOSIS — Z7960 Long term (current) use of unspecified immunomodulators and immunosuppressants: Secondary | ICD-10-CM | POA: Diagnosis not present

## 2023-01-05 DIAGNOSIS — D84821 Immunodeficiency due to drugs: Secondary | ICD-10-CM | POA: Diagnosis not present

## 2023-01-05 DIAGNOSIS — Z87440 Personal history of urinary (tract) infections: Secondary | ICD-10-CM | POA: Diagnosis not present

## 2023-01-05 DIAGNOSIS — Z940 Kidney transplant status: Secondary | ICD-10-CM | POA: Diagnosis not present

## 2023-01-05 DIAGNOSIS — N189 Chronic kidney disease, unspecified: Secondary | ICD-10-CM | POA: Diagnosis not present

## 2023-01-05 DIAGNOSIS — D849 Immunodeficiency, unspecified: Secondary | ICD-10-CM | POA: Diagnosis not present

## 2023-01-05 DIAGNOSIS — E1122 Type 2 diabetes mellitus with diabetic chronic kidney disease: Secondary | ICD-10-CM | POA: Diagnosis not present

## 2023-01-05 DIAGNOSIS — N12 Tubulo-interstitial nephritis, not specified as acute or chronic: Secondary | ICD-10-CM | POA: Diagnosis not present

## 2023-01-05 DIAGNOSIS — E872 Acidosis, unspecified: Secondary | ICD-10-CM | POA: Diagnosis not present

## 2023-01-05 DIAGNOSIS — N10 Acute tubulo-interstitial nephritis: Secondary | ICD-10-CM | POA: Diagnosis not present

## 2023-01-05 DIAGNOSIS — Z7984 Long term (current) use of oral hypoglycemic drugs: Secondary | ICD-10-CM | POA: Diagnosis not present

## 2023-01-05 DIAGNOSIS — T8613 Kidney transplant infection: Secondary | ICD-10-CM | POA: Diagnosis not present

## 2023-01-05 DIAGNOSIS — N390 Urinary tract infection, site not specified: Secondary | ICD-10-CM | POA: Diagnosis not present

## 2023-01-05 DIAGNOSIS — D631 Anemia in chronic kidney disease: Secondary | ICD-10-CM | POA: Diagnosis not present

## 2023-01-06 DIAGNOSIS — N12 Tubulo-interstitial nephritis, not specified as acute or chronic: Secondary | ICD-10-CM | POA: Diagnosis not present

## 2023-01-06 DIAGNOSIS — D849 Immunodeficiency, unspecified: Secondary | ICD-10-CM | POA: Diagnosis not present

## 2023-01-06 DIAGNOSIS — Z7960 Long term (current) use of unspecified immunomodulators and immunosuppressants: Secondary | ICD-10-CM | POA: Diagnosis not present

## 2023-01-06 DIAGNOSIS — Z87440 Personal history of urinary (tract) infections: Secondary | ICD-10-CM | POA: Diagnosis not present

## 2023-01-06 DIAGNOSIS — Z940 Kidney transplant status: Secondary | ICD-10-CM | POA: Diagnosis not present

## 2023-01-06 DIAGNOSIS — N189 Chronic kidney disease, unspecified: Secondary | ICD-10-CM | POA: Diagnosis not present

## 2023-01-06 DIAGNOSIS — D84821 Immunodeficiency due to drugs: Secondary | ICD-10-CM | POA: Diagnosis not present

## 2023-01-06 DIAGNOSIS — D631 Anemia in chronic kidney disease: Secondary | ICD-10-CM | POA: Diagnosis not present

## 2023-01-06 DIAGNOSIS — E872 Acidosis, unspecified: Secondary | ICD-10-CM | POA: Diagnosis not present

## 2023-01-06 DIAGNOSIS — Z7984 Long term (current) use of oral hypoglycemic drugs: Secondary | ICD-10-CM | POA: Diagnosis not present

## 2023-01-06 DIAGNOSIS — N390 Urinary tract infection, site not specified: Secondary | ICD-10-CM | POA: Diagnosis not present

## 2023-01-06 DIAGNOSIS — E1122 Type 2 diabetes mellitus with diabetic chronic kidney disease: Secondary | ICD-10-CM | POA: Diagnosis not present

## 2023-01-06 DIAGNOSIS — N1339 Other hydronephrosis: Secondary | ICD-10-CM | POA: Diagnosis not present

## 2023-01-06 DIAGNOSIS — T8613 Kidney transplant infection: Secondary | ICD-10-CM | POA: Diagnosis not present

## 2023-01-06 DIAGNOSIS — N10 Acute tubulo-interstitial nephritis: Secondary | ICD-10-CM | POA: Diagnosis not present

## 2023-01-08 ENCOUNTER — Telehealth (HOSPITAL_BASED_OUTPATIENT_CLINIC_OR_DEPARTMENT_OTHER): Payer: Self-pay | Admitting: *Deleted

## 2023-01-08 NOTE — Telephone Encounter (Signed)
Post ED Visit - Positive Culture Follow-up  Culture report reviewed by antimicrobial stewardship pharmacist: [facility] [x]  Nicole Kindred, Pharm.D. []  Celedonio Miyamoto, Pharm.D., BCPS AQ-ID []  Garvin Fila, Pharm.D., BCPS []  Georgina Pillion, [address].D., BCPS []  [place], [place].D., BCPS, AAHIVP []  Estella Husk, Pharm.D., BCPS, AAHIVP []  Lysle Pearl, PharmD, BCPS []  Phillips Climes, PharmD, BCPS []  Agapito Games, PharmD, BCPS []  Verlan Friends, PharmD []  Mervyn Gay, PharmD, BCPS []  Vinnie Level, PharmD  [facility] []  Len Childs, PharmD []  Greer Pickerel, PharmD []  Adalberto Cole, PharmD []  Perlie Gold, Rph []  Lonell Face) Jean Rosenthal, PharmD []  Earl Many, PharmD []  Junita Push, PharmD []  Dorna Leitz, PharmD []  Terrilee Files, PharmD []  Lynann Beaver, PharmD []  Keturah Barre, PharmD []  Loralee Pacas, PharmD []  Bernadene Person, PharmD   Positive urine culture Transferred to [facility] and received Cipro and Macrobid and no further patient follow-up is required at this time.  Virl Axe [facility] 01/08/2023, 8:55 AM

## 2023-01-11 ENCOUNTER — Encounter: Payer: 59 | Admitting: Occupational Therapy

## 2023-01-11 DIAGNOSIS — R112 Nausea with vomiting, unspecified: Secondary | ICD-10-CM | POA: Diagnosis not present

## 2023-01-11 DIAGNOSIS — I10 Essential (primary) hypertension: Secondary | ICD-10-CM | POA: Diagnosis not present

## 2023-01-11 DIAGNOSIS — Z7984 Long term (current) use of oral hypoglycemic drugs: Secondary | ICD-10-CM | POA: Diagnosis not present

## 2023-01-11 DIAGNOSIS — K5900 Constipation, unspecified: Secondary | ICD-10-CM | POA: Diagnosis not present

## 2023-01-11 DIAGNOSIS — N186 End stage renal disease: Secondary | ICD-10-CM | POA: Diagnosis not present

## 2023-01-11 DIAGNOSIS — N179 Acute kidney failure, unspecified: Secondary | ICD-10-CM | POA: Diagnosis not present

## 2023-01-11 DIAGNOSIS — D849 Immunodeficiency, unspecified: Secondary | ICD-10-CM | POA: Diagnosis not present

## 2023-01-11 DIAGNOSIS — D631 Anemia in chronic kidney disease: Secondary | ICD-10-CM | POA: Diagnosis not present

## 2023-01-11 DIAGNOSIS — I129 Hypertensive chronic kidney disease with stage 1 through stage 4 chronic kidney disease, or unspecified chronic kidney disease: Secondary | ICD-10-CM | POA: Diagnosis not present

## 2023-01-11 DIAGNOSIS — D649 Anemia, unspecified: Secondary | ICD-10-CM | POA: Diagnosis not present

## 2023-01-11 DIAGNOSIS — R1031 Right lower quadrant pain: Secondary | ICD-10-CM | POA: Diagnosis not present

## 2023-01-11 DIAGNOSIS — I120 Hypertensive chronic kidney disease with stage 5 chronic kidney disease or end stage renal disease: Secondary | ICD-10-CM | POA: Diagnosis not present

## 2023-01-11 DIAGNOSIS — Z940 Kidney transplant status: Secondary | ICD-10-CM | POA: Diagnosis not present

## 2023-01-11 DIAGNOSIS — E119 Type 2 diabetes mellitus without complications: Secondary | ICD-10-CM | POA: Diagnosis not present

## 2023-01-11 DIAGNOSIS — E871 Hypo-osmolality and hyponatremia: Secondary | ICD-10-CM | POA: Diagnosis not present

## 2023-01-11 DIAGNOSIS — Z79899 Other long term (current) drug therapy: Secondary | ICD-10-CM | POA: Diagnosis not present

## 2023-01-11 DIAGNOSIS — Z7960 Long term (current) use of unspecified immunomodulators and immunosuppressants: Secondary | ICD-10-CM | POA: Diagnosis not present

## 2023-01-11 DIAGNOSIS — K5904 Chronic idiopathic constipation: Secondary | ICD-10-CM | POA: Diagnosis not present

## 2023-01-11 DIAGNOSIS — K5901 Slow transit constipation: Secondary | ICD-10-CM | POA: Diagnosis not present

## 2023-01-11 DIAGNOSIS — N390 Urinary tract infection, site not specified: Secondary | ICD-10-CM | POA: Diagnosis not present

## 2023-01-11 DIAGNOSIS — R1084 Generalized abdominal pain: Secondary | ICD-10-CM | POA: Diagnosis not present

## 2023-01-11 DIAGNOSIS — T8619 Other complication of kidney transplant: Secondary | ICD-10-CM | POA: Diagnosis not present

## 2023-01-11 DIAGNOSIS — N189 Chronic kidney disease, unspecified: Secondary | ICD-10-CM | POA: Diagnosis not present

## 2023-01-11 DIAGNOSIS — E1122 Type 2 diabetes mellitus with diabetic chronic kidney disease: Secondary | ICD-10-CM | POA: Diagnosis not present

## 2023-01-11 DIAGNOSIS — E1121 Type 2 diabetes mellitus with diabetic nephropathy: Secondary | ICD-10-CM | POA: Diagnosis not present

## 2023-01-11 DIAGNOSIS — B952 Enterococcus as the cause of diseases classified elsewhere: Secondary | ICD-10-CM | POA: Diagnosis not present

## 2023-01-11 DIAGNOSIS — D84821 Immunodeficiency due to drugs: Secondary | ICD-10-CM | POA: Diagnosis not present

## 2023-01-12 ENCOUNTER — Other Ambulatory Visit: Payer: Self-pay

## 2023-01-12 ENCOUNTER — Emergency Department (HOSPITAL_COMMUNITY): Payer: 59

## 2023-01-12 ENCOUNTER — Encounter (HOSPITAL_COMMUNITY): Payer: Self-pay | Admitting: Emergency Medicine

## 2023-01-12 ENCOUNTER — Emergency Department (HOSPITAL_COMMUNITY)
Admission: EM | Admit: 2023-01-12 | Discharge: 2023-01-12 | Disposition: A | Discharge status: Home / Self Care | Payer: 59 | Attending: Emergency Medicine | Admitting: Emergency Medicine

## 2023-01-12 DIAGNOSIS — Z79899 Other long term (current) drug therapy: Secondary | ICD-10-CM | POA: Insufficient documentation

## 2023-01-12 DIAGNOSIS — K5901 Slow transit constipation: Secondary | ICD-10-CM | POA: Diagnosis not present

## 2023-01-12 DIAGNOSIS — R112 Nausea with vomiting, unspecified: Secondary | ICD-10-CM | POA: Diagnosis not present

## 2023-01-12 DIAGNOSIS — E1122 Type 2 diabetes mellitus with diabetic chronic kidney disease: Secondary | ICD-10-CM | POA: Insufficient documentation

## 2023-01-12 DIAGNOSIS — R1031 Right lower quadrant pain: Secondary | ICD-10-CM | POA: Diagnosis not present

## 2023-01-12 DIAGNOSIS — D631 Anemia in chronic kidney disease: Secondary | ICD-10-CM | POA: Insufficient documentation

## 2023-01-12 DIAGNOSIS — Z940 Kidney transplant status: Secondary | ICD-10-CM

## 2023-01-12 DIAGNOSIS — N189 Chronic kidney disease, unspecified: Secondary | ICD-10-CM

## 2023-01-12 DIAGNOSIS — N186 End stage renal disease: Secondary | ICD-10-CM | POA: Diagnosis not present

## 2023-01-12 DIAGNOSIS — R109 Unspecified abdominal pain: Secondary | ICD-10-CM

## 2023-01-12 DIAGNOSIS — K5900 Constipation, unspecified: Secondary | ICD-10-CM

## 2023-01-12 DIAGNOSIS — I129 Hypertensive chronic kidney disease with stage 1 through stage 4 chronic kidney disease, or unspecified chronic kidney disease: Secondary | ICD-10-CM | POA: Diagnosis not present

## 2023-01-12 DIAGNOSIS — I10 Essential (primary) hypertension: Secondary | ICD-10-CM | POA: Diagnosis not present

## 2023-01-12 DIAGNOSIS — D649 Anemia, unspecified: Secondary | ICD-10-CM | POA: Diagnosis not present

## 2023-01-12 DIAGNOSIS — E871 Hypo-osmolality and hyponatremia: Secondary | ICD-10-CM | POA: Diagnosis not present

## 2023-01-12 DIAGNOSIS — I120 Hypertensive chronic kidney disease with stage 5 chronic kidney disease or end stage renal disease: Secondary | ICD-10-CM | POA: Insufficient documentation

## 2023-01-12 DIAGNOSIS — N179 Acute kidney failure, unspecified: Secondary | ICD-10-CM | POA: Diagnosis not present

## 2023-01-12 LAB — COMPREHENSIVE METABOLIC PANEL
ALT: 11 U/L (ref 0–44)
AST: 13 U/L — ABNORMAL LOW (ref 15–41)
Albumin: 4.2 g/dL (ref 3.5–5.0)
Alkaline Phosphatase: 77 U/L (ref 38–126)
Anion gap: 10 (ref 5–15)
BUN: 26 mg/dL — ABNORMAL HIGH (ref 8–23)
CO2: 20 mmol/L — ABNORMAL LOW (ref 22–32)
Calcium: 8.7 mg/dL — ABNORMAL LOW (ref 8.9–10.3)
Chloride: 98 mmol/L (ref 98–111)
Creatinine, Ser: 1.5 mg/dL — ABNORMAL HIGH (ref 0.44–1.00)
GFR, Estimated: 38 mL/min — ABNORMAL LOW (ref >60)
Glucose, Bld: 201 mg/dL — ABNORMAL HIGH (ref 70–99)
Potassium: 5.4 mmol/L — ABNORMAL HIGH (ref 3.5–5.1)
Sodium: 128 mmol/L — ABNORMAL LOW (ref 135–145)
Total Bilirubin: 0.5 mg/dL (ref 0.3–1.2)
Total Protein: 7.1 g/dL (ref 6.5–8.1)

## 2023-01-12 LAB — URINALYSIS, ROUTINE W REFLEX MICROSCOPIC
Bilirubin Urine: NEGATIVE
Glucose, UA: 50 mg/dL — AB
Hgb urine dipstick: NEGATIVE
Ketones, ur: NEGATIVE mg/dL
Leukocytes,Ua: NEGATIVE
Nitrite: NEGATIVE
Protein, ur: NEGATIVE mg/dL
Specific Gravity, Urine: 1.005 (ref 1.005–1.030)
pH: 7 (ref 5.0–8.0)

## 2023-01-12 LAB — CBC
HCT: 27.1 % — ABNORMAL LOW (ref 36.0–46.0)
Hemoglobin: 9 g/dL — ABNORMAL LOW (ref 12.0–15.0)
MCH: 31.5 pg (ref 26.0–34.0)
MCHC: 33.2 g/dL (ref 30.0–36.0)
MCV: 94.8 fL (ref 80.0–100.0)
Platelets: 225 10*3/uL (ref 150–400)
RBC: 2.86 MIL/uL — ABNORMAL LOW (ref 3.87–5.11)
RDW: 15.9 % — ABNORMAL HIGH (ref 11.5–15.5)
WBC: 9.3 10*3/uL (ref 4.0–10.5)
nRBC: 0 % (ref 0.0–0.2)

## 2023-01-12 LAB — LIPASE, BLOOD: Lipase: 31 U/L (ref 11–51)

## 2023-01-12 MED ORDER — MORPHINE SULFATE (PF) 4 MG/ML IV SOLN
4.0000 mg | Freq: Once | INTRAVENOUS | Status: AC
  Administered 2023-01-12: 4 mg via INTRAMUSCULAR
  Filled 2023-01-12: qty 1

## 2023-01-12 NOTE — Discharge Instructions (Signed)
Tome MiraLax para el estreimiento. Comience con Neomia Dear dosis al da y aumente el nmero de dosis hasta obtener [place].

## 2023-01-12 NOTE — ED Triage Notes (Signed)
Pt reports constipation since Friday. Pt took magnesium citrate, 2 enemas and coffee with no relief. Back pain as well.

## 2023-01-12 NOTE — ED Provider Notes (Signed)
[place] EMERGENCY DEPARTMENT AT [facility] Provider Note   CSN: [id number] Arrival date & time: 01/12/23  0030     History  Chief Complaint  Patient presents with   Constipation    Rachael Burke is a 67 y.o. female.  The history is provided by the patient. A language interpreter was used.  Constipation She has history of hypertension, diabetes, hyperlipidemia, end-stage renal disease status post renal transplant on June 11 comes in because of right lower quadrant pain radiating to the right flank.  This pain has been present for a long time, but workup worse over the last 3 days.  She also relates chronic constipation.  She has not had a bowel movement in the last 4 days and is passed only small amount of flatus.  There has been associated nausea and vomiting.  She also relates relatively modest urine output.  She denies fever or chills.  She has taken acetaminophen for pain without relief.  She has drank coconut milk with her coffee which had been giving her slight bowel movements, but nothing for the last 3 days.   Home Medications Prior to Admission medications   Medication Sig Start Date End Date Taking? Authorizing Provider  acetaminophen (TYLENOL) 325 MG tablet Take 2 tablets (650 mg total) by mouth every 6 (six) hours as needed for mild pain or moderate pain. 09/05/21   Renne Crigler, PA-C  amLODipine (NORVASC) 10 MG tablet Take 1 tablet by mouth daily.    [provider]  calcium acetate (PHOSLO) 667 MG capsule Take 667 mg by mouth 3 (three) times daily. 04/02/21   [provider]  carvedilol (COREG) 12.5 MG tablet Take 1 tablet (12.5 mg total) by mouth 2 (two) times daily. 04/29/22 10/29/22  Custovic, Rozell Searing, DO  gabapentin (NEURONTIN) 100 MG capsule Take 300 mg by mouth at bedtime. 09/03/21   [provider]  HYDROcodone-acetaminophen (NORCO/VICODIN) 5-325 MG tablet Take 1 tablet by mouth every 6 (six) hours as needed for severe  pain. 10/07/22   Gerhard Munch, MD  isosorbide-hydrALAZINE (BIDIL) 20-37.5 MG tablet Take 1 tablet by mouth 3 (three) times daily. 01/04/22   Lonia Blood, MD  meclizine (ANTIVERT) 12.5 MG tablet Take 1 tablet (12.5 mg total) by mouth 2 (two) times daily as needed for dizziness. 06/26/22   Antony Madura, MD  nitroGLYCERIN (NITROSTAT) 0.4 MG SL tablet Place 1 tablet (0.4 mg total) under the tongue every 5 (five) minutes as needed for chest pain. 01/09/22 10/29/22  Custovic, Rozell Searing, DO  omeprazole (PRILOSEC OTC) 20 MG tablet Take 20 mg by mouth daily as needed (indigestion).    [provider]  ondansetron (ZOFRAN) 4 MG tablet Take 1 tablet (4 mg total) by mouth every 8 (eight) hours as needed for nausea or vomiting. 10/28/22   Fayrene Helper, PA-C  oxyCODONE (ROXICODONE) 5 MG immediate release tablet Take 1 tablet (5 mg total) by mouth every 6 (six) hours as needed for up to 10 doses for breakthrough pain. 10/26/22   Curatolo, Adam, DO  polyethylene glycol (MIRALAX / GLYCOLAX) 17 g packet Take 17 g by mouth daily.    [provider]      Allergies    Ceftriaxone    Review of Systems   Review of Systems  Gastrointestinal:  Positive for constipation.  All other systems reviewed and are negative.   Physical Exam Updated Vital Signs BP 126/60   Pulse 64   Temp 98.2 F (36.8 C) (  Oral)   Resp 18   SpO2 99%  Physical Exam Vitals and nursing note reviewed.   67 year old [place], resting comfortably and in no acute distress. Vital signs are normal. Oxygen saturation is 99%, which is normal. Head is normocephalic and atraumatic. PERRLA, EOMI. Oropharynx is clear. Neck is nontender and supple without adenopathy or JVD. Back is nontender and there is no CVA tenderness. Lungs are clear without rales, wheezes, or rhonchi. Chest is nontender. Heart has regular rate and rhythm without murmur. Abdomen is soft, flat.  Transplanted kidney is palpable in the right lower quadrant with  minimal tenderness.  No other abdominal tenderness is elicited. Extremities have no cyanosis or edema, full range of motion is present.  AV fistula present in the left upper arm with thrill present. Skin is warm and dry without rash. Neurologic: Mental status is normal, cranial nerves are intact, moves all extremities equally.  ED Results / Procedures / Treatments   Labs (all labs ordered are listed, but only abnormal results are displayed) Labs Reviewed  COMPREHENSIVE METABOLIC PANEL - Abnormal; Notable for the following components:      Result Value   Sodium 128 (*)    Potassium 5.4 (*)    CO2 20 (*)    Glucose, Bld 201 (*)    BUN 26 (*)    Creatinine, Ser 1.50 (*)    Calcium 8.7 (*)    AST 13 (*)    GFR, Estimated 38 (*)    All other components within normal limits  CBC - Abnormal; Notable for the following components:   RBC 2.86 (*)    Hemoglobin 9.0 (*)    HCT 27.1 (*)    RDW 15.9 (*)    All other components within normal limits  URINALYSIS, ROUTINE W REFLEX MICROSCOPIC - Abnormal; Notable for the following components:   Glucose, UA 50 (*)    All other components within normal limits  LIPASE, BLOOD   Radiology CT ABDOMEN PELVIS WO CONTRAST  Result Date: 01/12/2023 CLINICAL DATA:  Bowel obstruction suspected. History of renal transplant EXAM: CT ABDOMEN AND PELVIS WITHOUT CONTRAST TECHNIQUE: Multidetector CT imaging of the abdomen and pelvis was performed following the standard protocol without IV contrast. RADIATION DOSE REDUCTION: This exam was performed according to the departmental dose-optimization program which includes automated exposure control, adjustment of the mA and/or kV according to patient size and/or use of iterative reconstruction technique. COMPARISON:  01/04/2023 FINDINGS: Lower chest:  No contributory findings. Hepatobiliary: No focal liver abnormality.Cholecystectomy. No biliary dilatation. Pancreas: Unremarkable. Spleen: Unremarkable. Adrenals/Urinary  Tract: Negative adrenals. No hydronephrosis or stone. Symmetric native renal atrophy. Stable appearance of renal transplant in the right lower quadrant with nearly 3 cm cystic density anteriorly and stable perirenal fat haziness. No hydronephrosis or acute hemorrhage. Unremarkable bladder. Stomach/Bowel: No obstruction. Moderate stool in the colon. No bowel inflammation noted. Vascular/Lymphatic: No acute vascular abnormality. Extensive atheromatous calcification. No mass or adenopathy. Reproductive:Hysterectomy with signs of pelvic floor laxity. Other: No ascites or pneumoperitoneum. Subcutaneous edema along the right abdominal incision. Musculoskeletal: No acute abnormalities. IMPRESSION: 1. Negative for bowel obstruction or visible inflammation. 2. Stable appearance of recent transplant kidney in the right lower quadrant. Electronically Signed   By: Tiburcio Pea M.D.   On: 01/12/2023 05:53    Procedures Procedures    Medications Ordered in ED Medications  morphine (PF) 4 MG/ML injection 4 mg (4 mg Intramuscular Given 01/12/23 0321)    ED Course/ Medical Decision Making/ A&P  Medical Decision Making Amount and/or Complexity of Data Reviewed Labs: ordered. Radiology: ordered.  Risk Prescription drug management.   Abdominal pain and constipation in a patient with recent renal transplant.  I have reviewed her past records and I do note hospitalization 11/10/2022-11/20/2022 for renal transplant.  Abdominal x-ray yesterday showed large stool burden in the right colon.  On 01/06/2023 ultrasound of kidney transplant did show mild hydronephrosis.  Differential diagnosis for patient's pain includes, but is not limited to, renal colic, diverticulitis, pyelonephritis, bowel obstruction.  These are conditions with a high risk of morbidity and complications.  I have reviewed her laboratory tests available at this point, and my interpretation is stable anemia and urinalysis  significant only for mild glucosuria.  I have ordered a noncontrast CT scan of abdomen and pelvis to evaluate for possible diverticulitis and possible bowel obstruction and possible hydronephrosis.  CT scan shows stable appearance of recent kidney transplant, no acute process.  I have independently viewed the images, and agree with radiologist interpretation.  Incidental finding is large stool burden throughout the colon.  I have reviewed her chemistry results, and my interpretation is hyponatremia which is not felt to be clinically significant, stable renal insufficiency with creatinine actually improved somewhat compared with comprehensive metabolic panel at [facility] drawn yesterday.  I feel her abdominal pain is from her constipation.  I have instructed her to use polyethylene glycol as needed for control of constipation.  I am referring her back to her primary care provider and her renal transplant team.  Final Clinical Impression(s) / ED Diagnoses Final diagnoses:  Right sided abdominal pain  Anemia associated with chronic renal failure  History of renal transplant  Hyponatremia  Constipation, unspecified constipation type    Rx / DC Orders ED Discharge Orders     None         Dione Booze, MD 01/12/23 [id number]

## 2023-01-12 NOTE — ED Notes (Signed)
Attempted IV on pt, unsuccessful.

## 2023-01-13 DIAGNOSIS — N179 Acute kidney failure, unspecified: Secondary | ICD-10-CM | POA: Diagnosis not present

## 2023-01-14 ENCOUNTER — Ambulatory Visit: Payer: 59 | Admitting: Occupational Therapy

## 2023-01-14 ENCOUNTER — Ambulatory Visit: Payer: 59

## 2023-01-14 ENCOUNTER — Telehealth: Payer: Self-pay

## 2023-01-14 NOTE — Telephone Encounter (Signed)
Patient Name: Rachael Burke MRN: [id number] DOB:1955-09-08, 67 y.o., female Today's Date: 01/14/2023  Spoke with patient's daughter. Patient was still not feeling well after recent ED visit and that is why they had called and left VM to cancel today's PT and OT appt. Pt's daughter was reminded of patient's next PT/OT appt date/time.   Ileana Ladd, PT 01/14/2023, 2:34 PM

## 2023-01-18 DIAGNOSIS — Z794 Long term (current) use of insulin: Secondary | ICD-10-CM | POA: Diagnosis not present

## 2023-01-18 DIAGNOSIS — D120 Benign neoplasm of cecum: Secondary | ICD-10-CM | POA: Diagnosis not present

## 2023-01-18 DIAGNOSIS — Z4822 Encounter for aftercare following kidney transplant: Secondary | ICD-10-CM | POA: Diagnosis not present

## 2023-01-18 DIAGNOSIS — N179 Acute kidney failure, unspecified: Secondary | ICD-10-CM | POA: Diagnosis not present

## 2023-01-18 DIAGNOSIS — D84821 Immunodeficiency due to drugs: Secondary | ICD-10-CM | POA: Diagnosis not present

## 2023-01-18 DIAGNOSIS — K5900 Constipation, unspecified: Secondary | ICD-10-CM | POA: Diagnosis not present

## 2023-01-18 DIAGNOSIS — Z7984 Long term (current) use of oral hypoglycemic drugs: Secondary | ICD-10-CM | POA: Diagnosis not present

## 2023-01-18 DIAGNOSIS — K2970 Gastritis, unspecified, without bleeding: Secondary | ICD-10-CM | POA: Diagnosis not present

## 2023-01-18 DIAGNOSIS — D849 Immunodeficiency, unspecified: Secondary | ICD-10-CM | POA: Diagnosis not present

## 2023-01-18 DIAGNOSIS — R1319 Other dysphagia: Secondary | ICD-10-CM | POA: Diagnosis not present

## 2023-01-18 DIAGNOSIS — Z992 Dependence on renal dialysis: Secondary | ICD-10-CM | POA: Diagnosis not present

## 2023-01-18 DIAGNOSIS — D125 Benign neoplasm of sigmoid colon: Secondary | ICD-10-CM | POA: Diagnosis not present

## 2023-01-18 DIAGNOSIS — Z79621 Long term (current) use of calcineurin inhibitor: Secondary | ICD-10-CM | POA: Diagnosis not present

## 2023-01-18 DIAGNOSIS — D123 Benign neoplasm of transverse colon: Secondary | ICD-10-CM | POA: Diagnosis not present

## 2023-01-18 DIAGNOSIS — K5901 Slow transit constipation: Secondary | ICD-10-CM | POA: Diagnosis not present

## 2023-01-18 DIAGNOSIS — Z79899 Other long term (current) drug therapy: Secondary | ICD-10-CM | POA: Diagnosis not present

## 2023-01-18 DIAGNOSIS — T8619 Other complication of kidney transplant: Secondary | ICD-10-CM | POA: Diagnosis not present

## 2023-01-18 DIAGNOSIS — K2950 Unspecified chronic gastritis without bleeding: Secondary | ICD-10-CM | POA: Diagnosis not present

## 2023-01-18 DIAGNOSIS — Z5181 Encounter for therapeutic drug level monitoring: Secondary | ICD-10-CM | POA: Diagnosis not present

## 2023-01-18 DIAGNOSIS — R1084 Generalized abdominal pain: Secondary | ICD-10-CM | POA: Diagnosis not present

## 2023-01-18 DIAGNOSIS — Z940 Kidney transplant status: Secondary | ICD-10-CM | POA: Diagnosis not present

## 2023-01-18 DIAGNOSIS — K5909 Other constipation: Secondary | ICD-10-CM | POA: Diagnosis not present

## 2023-01-18 DIAGNOSIS — Z9049 Acquired absence of other specified parts of digestive tract: Secondary | ICD-10-CM | POA: Diagnosis not present

## 2023-01-18 DIAGNOSIS — R1310 Dysphagia, unspecified: Secondary | ICD-10-CM | POA: Diagnosis not present

## 2023-01-18 DIAGNOSIS — K3189 Other diseases of stomach and duodenum: Secondary | ICD-10-CM | POA: Diagnosis not present

## 2023-01-18 DIAGNOSIS — E1122 Type 2 diabetes mellitus with diabetic chronic kidney disease: Secondary | ICD-10-CM | POA: Diagnosis not present

## 2023-01-18 DIAGNOSIS — D62 Acute posthemorrhagic anemia: Secondary | ICD-10-CM | POA: Diagnosis not present

## 2023-01-18 DIAGNOSIS — R0902 Hypoxemia: Secondary | ICD-10-CM | POA: Diagnosis not present

## 2023-01-18 DIAGNOSIS — E871 Hypo-osmolality and hyponatremia: Secondary | ICD-10-CM | POA: Diagnosis not present

## 2023-01-18 DIAGNOSIS — E875 Hyperkalemia: Secondary | ICD-10-CM | POA: Diagnosis not present

## 2023-01-18 DIAGNOSIS — K635 Polyp of colon: Secondary | ICD-10-CM | POA: Diagnosis not present

## 2023-01-18 DIAGNOSIS — Z7960 Long term (current) use of unspecified immunomodulators and immunosuppressants: Secondary | ICD-10-CM | POA: Diagnosis not present

## 2023-01-18 DIAGNOSIS — R1010 Upper abdominal pain, unspecified: Secondary | ICD-10-CM | POA: Diagnosis not present

## 2023-01-18 DIAGNOSIS — E1165 Type 2 diabetes mellitus with hyperglycemia: Secondary | ICD-10-CM | POA: Diagnosis not present

## 2023-01-18 DIAGNOSIS — E1121 Type 2 diabetes mellitus with diabetic nephropathy: Secondary | ICD-10-CM | POA: Diagnosis not present

## 2023-01-18 DIAGNOSIS — K6389 Other specified diseases of intestine: Secondary | ICD-10-CM | POA: Diagnosis not present

## 2023-01-20 ENCOUNTER — Ambulatory Visit: Payer: 59 | Admitting: Physical Therapy

## 2023-01-20 ENCOUNTER — Ambulatory Visit: Payer: 59 | Admitting: Occupational Therapy

## 2023-01-20 DIAGNOSIS — K5901 Slow transit constipation: Secondary | ICD-10-CM | POA: Diagnosis not present

## 2023-01-20 DIAGNOSIS — Z940 Kidney transplant status: Secondary | ICD-10-CM | POA: Diagnosis not present

## 2023-01-20 DIAGNOSIS — D84821 Immunodeficiency due to drugs: Secondary | ICD-10-CM | POA: Diagnosis not present

## 2023-01-20 DIAGNOSIS — N179 Acute kidney failure, unspecified: Secondary | ICD-10-CM | POA: Diagnosis not present

## 2023-01-20 DIAGNOSIS — K5900 Constipation, unspecified: Secondary | ICD-10-CM | POA: Diagnosis not present

## 2023-01-20 DIAGNOSIS — R1010 Upper abdominal pain, unspecified: Secondary | ICD-10-CM | POA: Diagnosis not present

## 2023-01-20 DIAGNOSIS — R1319 Other dysphagia: Secondary | ICD-10-CM | POA: Diagnosis not present

## 2023-01-20 DIAGNOSIS — Z79899 Other long term (current) drug therapy: Secondary | ICD-10-CM | POA: Diagnosis not present

## 2023-01-20 DIAGNOSIS — E875 Hyperkalemia: Secondary | ICD-10-CM | POA: Diagnosis not present

## 2023-01-21 DIAGNOSIS — E875 Hyperkalemia: Secondary | ICD-10-CM | POA: Diagnosis not present

## 2023-01-21 DIAGNOSIS — N179 Acute kidney failure, unspecified: Secondary | ICD-10-CM | POA: Diagnosis not present

## 2023-01-21 DIAGNOSIS — D84821 Immunodeficiency due to drugs: Secondary | ICD-10-CM | POA: Diagnosis not present

## 2023-01-21 DIAGNOSIS — Z940 Kidney transplant status: Secondary | ICD-10-CM | POA: Diagnosis not present

## 2023-01-21 DIAGNOSIS — R1319 Other dysphagia: Secondary | ICD-10-CM | POA: Diagnosis not present

## 2023-01-21 DIAGNOSIS — R1084 Generalized abdominal pain: Secondary | ICD-10-CM | POA: Diagnosis not present

## 2023-01-21 DIAGNOSIS — K5900 Constipation, unspecified: Secondary | ICD-10-CM | POA: Diagnosis not present

## 2023-01-21 DIAGNOSIS — Z79899 Other long term (current) drug therapy: Secondary | ICD-10-CM | POA: Diagnosis not present

## 2023-01-22 DIAGNOSIS — D125 Benign neoplasm of sigmoid colon: Secondary | ICD-10-CM | POA: Diagnosis not present

## 2023-01-22 DIAGNOSIS — K5901 Slow transit constipation: Secondary | ICD-10-CM | POA: Diagnosis not present

## 2023-01-22 DIAGNOSIS — R1310 Dysphagia, unspecified: Secondary | ICD-10-CM | POA: Diagnosis not present

## 2023-01-22 DIAGNOSIS — R1319 Other dysphagia: Secondary | ICD-10-CM | POA: Diagnosis not present

## 2023-01-22 DIAGNOSIS — K6389 Other specified diseases of intestine: Secondary | ICD-10-CM | POA: Diagnosis not present

## 2023-01-22 DIAGNOSIS — K2970 Gastritis, unspecified, without bleeding: Secondary | ICD-10-CM | POA: Diagnosis not present

## 2023-01-22 DIAGNOSIS — K3189 Other diseases of stomach and duodenum: Secondary | ICD-10-CM | POA: Diagnosis not present

## 2023-01-22 DIAGNOSIS — K2950 Unspecified chronic gastritis without bleeding: Secondary | ICD-10-CM | POA: Diagnosis not present

## 2023-01-22 DIAGNOSIS — K635 Polyp of colon: Secondary | ICD-10-CM | POA: Diagnosis not present

## 2023-01-22 DIAGNOSIS — Z940 Kidney transplant status: Secondary | ICD-10-CM | POA: Diagnosis not present

## 2023-01-22 DIAGNOSIS — D123 Benign neoplasm of transverse colon: Secondary | ICD-10-CM | POA: Diagnosis not present

## 2023-01-22 DIAGNOSIS — Z79899 Other long term (current) drug therapy: Secondary | ICD-10-CM | POA: Diagnosis not present

## 2023-01-22 DIAGNOSIS — D120 Benign neoplasm of cecum: Secondary | ICD-10-CM | POA: Diagnosis not present

## 2023-01-22 DIAGNOSIS — D84821 Immunodeficiency due to drugs: Secondary | ICD-10-CM | POA: Diagnosis not present

## 2023-01-23 DIAGNOSIS — E875 Hyperkalemia: Secondary | ICD-10-CM | POA: Diagnosis not present

## 2023-01-26 DIAGNOSIS — Z4822 Encounter for aftercare following kidney transplant: Secondary | ICD-10-CM | POA: Diagnosis not present

## 2023-01-26 DIAGNOSIS — Z5181 Encounter for therapeutic drug level monitoring: Secondary | ICD-10-CM | POA: Diagnosis not present

## 2023-01-26 DIAGNOSIS — E872 Acidosis, unspecified: Secondary | ICD-10-CM | POA: Diagnosis not present

## 2023-01-26 DIAGNOSIS — K5909 Other constipation: Secondary | ICD-10-CM | POA: Diagnosis not present

## 2023-01-26 DIAGNOSIS — Z79621 Long term (current) use of calcineurin inhibitor: Secondary | ICD-10-CM | POA: Diagnosis not present

## 2023-01-26 DIAGNOSIS — K2950 Unspecified chronic gastritis without bleeding: Secondary | ICD-10-CM | POA: Diagnosis not present

## 2023-01-26 DIAGNOSIS — Z940 Kidney transplant status: Secondary | ICD-10-CM | POA: Diagnosis not present

## 2023-01-26 DIAGNOSIS — D849 Immunodeficiency, unspecified: Secondary | ICD-10-CM | POA: Diagnosis not present

## 2023-01-26 DIAGNOSIS — E1121 Type 2 diabetes mellitus with diabetic nephropathy: Secondary | ICD-10-CM | POA: Diagnosis not present

## 2023-01-27 ENCOUNTER — Encounter: Payer: 59 | Admitting: Occupational Therapy

## 2023-01-27 ENCOUNTER — Ambulatory Visit: Payer: 59 | Admitting: Physical Therapy

## 2023-01-28 DIAGNOSIS — K5909 Other constipation: Secondary | ICD-10-CM | POA: Diagnosis not present

## 2023-01-28 DIAGNOSIS — R1084 Generalized abdominal pain: Secondary | ICD-10-CM | POA: Diagnosis not present

## 2023-01-28 DIAGNOSIS — Z86010 Personal history of colonic polyps: Secondary | ICD-10-CM | POA: Diagnosis not present

## 2023-02-03 ENCOUNTER — Encounter: Payer: 59 | Admitting: Occupational Therapy

## 2023-02-03 ENCOUNTER — Ambulatory Visit: Payer: 59 | Admitting: Physical Therapy

## 2023-02-04 ENCOUNTER — Ambulatory Visit: Payer: 59 | Admitting: Gastroenterology

## 2023-02-05 DIAGNOSIS — Z4822 Encounter for aftercare following kidney transplant: Secondary | ICD-10-CM | POA: Diagnosis not present

## 2023-02-05 DIAGNOSIS — Z7984 Long term (current) use of oral hypoglycemic drugs: Secondary | ICD-10-CM | POA: Diagnosis not present

## 2023-02-05 DIAGNOSIS — E875 Hyperkalemia: Secondary | ICD-10-CM | POA: Diagnosis not present

## 2023-02-05 DIAGNOSIS — D84821 Immunodeficiency due to drugs: Secondary | ICD-10-CM | POA: Diagnosis not present

## 2023-02-05 DIAGNOSIS — E872 Acidosis, unspecified: Secondary | ICD-10-CM | POA: Diagnosis not present

## 2023-02-05 DIAGNOSIS — E1121 Type 2 diabetes mellitus with diabetic nephropathy: Secondary | ICD-10-CM | POA: Diagnosis not present

## 2023-02-05 DIAGNOSIS — Z5181 Encounter for therapeutic drug level monitoring: Secondary | ICD-10-CM | POA: Diagnosis not present

## 2023-02-05 DIAGNOSIS — Z79899 Other long term (current) drug therapy: Secondary | ICD-10-CM | POA: Diagnosis not present

## 2023-02-05 DIAGNOSIS — K5909 Other constipation: Secondary | ICD-10-CM | POA: Diagnosis not present

## 2023-02-05 DIAGNOSIS — D849 Immunodeficiency, unspecified: Secondary | ICD-10-CM | POA: Diagnosis not present

## 2023-02-05 DIAGNOSIS — Z79621 Long term (current) use of calcineurin inhibitor: Secondary | ICD-10-CM | POA: Diagnosis not present

## 2023-02-05 DIAGNOSIS — Z940 Kidney transplant status: Secondary | ICD-10-CM | POA: Diagnosis not present

## 2023-02-10 ENCOUNTER — Ambulatory Visit: Payer: 59 | Admitting: Physical Therapy

## 2023-02-10 DIAGNOSIS — H02882 Meibomian gland dysfunction right lower eyelid: Secondary | ICD-10-CM | POA: Diagnosis not present

## 2023-02-10 DIAGNOSIS — H16103 Unspecified superficial keratitis, bilateral: Secondary | ICD-10-CM | POA: Diagnosis not present

## 2023-02-12 DIAGNOSIS — Z940 Kidney transplant status: Secondary | ICD-10-CM | POA: Diagnosis not present

## 2023-02-12 DIAGNOSIS — E1121 Type 2 diabetes mellitus with diabetic nephropathy: Secondary | ICD-10-CM | POA: Diagnosis not present

## 2023-02-12 DIAGNOSIS — I10 Essential (primary) hypertension: Secondary | ICD-10-CM | POA: Diagnosis not present

## 2023-02-12 DIAGNOSIS — D849 Immunodeficiency, unspecified: Secondary | ICD-10-CM | POA: Diagnosis not present

## 2023-02-12 DIAGNOSIS — Z5181 Encounter for therapeutic drug level monitoring: Secondary | ICD-10-CM | POA: Diagnosis not present

## 2023-02-12 DIAGNOSIS — Z4822 Encounter for aftercare following kidney transplant: Secondary | ICD-10-CM | POA: Diagnosis not present

## 2023-02-12 DIAGNOSIS — E875 Hyperkalemia: Secondary | ICD-10-CM | POA: Diagnosis not present

## 2023-02-12 DIAGNOSIS — Z79621 Long term (current) use of calcineurin inhibitor: Secondary | ICD-10-CM | POA: Diagnosis not present

## 2023-02-14 DIAGNOSIS — N390 Urinary tract infection, site not specified: Secondary | ICD-10-CM | POA: Diagnosis not present

## 2023-02-14 DIAGNOSIS — R300 Dysuria: Secondary | ICD-10-CM | POA: Diagnosis not present

## 2023-02-17 ENCOUNTER — Ambulatory Visit: Payer: 59 | Admitting: Physical Therapy

## 2023-02-17 DIAGNOSIS — H5203 Hypermetropia, bilateral: Secondary | ICD-10-CM | POA: Diagnosis not present

## 2023-02-26 DIAGNOSIS — Z940 Kidney transplant status: Secondary | ICD-10-CM | POA: Diagnosis not present

## 2023-02-26 DIAGNOSIS — E875 Hyperkalemia: Secondary | ICD-10-CM | POA: Diagnosis not present

## 2023-02-26 DIAGNOSIS — E872 Acidosis, unspecified: Secondary | ICD-10-CM | POA: Diagnosis not present

## 2023-02-26 DIAGNOSIS — D849 Immunodeficiency, unspecified: Secondary | ICD-10-CM | POA: Diagnosis not present

## 2023-02-26 DIAGNOSIS — Z4822 Encounter for aftercare following kidney transplant: Secondary | ICD-10-CM | POA: Diagnosis not present

## 2023-02-26 DIAGNOSIS — D649 Anemia, unspecified: Secondary | ICD-10-CM | POA: Diagnosis not present

## 2023-02-26 DIAGNOSIS — Z5181 Encounter for therapeutic drug level monitoring: Secondary | ICD-10-CM | POA: Diagnosis not present

## 2023-02-26 DIAGNOSIS — E119 Type 2 diabetes mellitus without complications: Secondary | ICD-10-CM | POA: Diagnosis not present

## 2023-02-26 DIAGNOSIS — I10 Essential (primary) hypertension: Secondary | ICD-10-CM | POA: Diagnosis not present

## 2023-02-26 DIAGNOSIS — Z79899 Other long term (current) drug therapy: Secondary | ICD-10-CM | POA: Diagnosis not present

## 2023-02-26 DIAGNOSIS — K5909 Other constipation: Secondary | ICD-10-CM | POA: Diagnosis not present

## 2023-02-26 DIAGNOSIS — R1310 Dysphagia, unspecified: Secondary | ICD-10-CM | POA: Diagnosis not present

## 2023-02-26 DIAGNOSIS — E1121 Type 2 diabetes mellitus with diabetic nephropathy: Secondary | ICD-10-CM | POA: Diagnosis not present

## 2023-02-26 DIAGNOSIS — D84821 Immunodeficiency due to drugs: Secondary | ICD-10-CM | POA: Diagnosis not present

## 2023-02-26 DIAGNOSIS — R5381 Other malaise: Secondary | ICD-10-CM | POA: Diagnosis not present

## 2023-02-26 DIAGNOSIS — Z79621 Long term (current) use of calcineurin inhibitor: Secondary | ICD-10-CM | POA: Diagnosis not present

## 2023-02-26 DIAGNOSIS — K5900 Constipation, unspecified: Secondary | ICD-10-CM | POA: Diagnosis not present

## 2023-02-26 DIAGNOSIS — E785 Hyperlipidemia, unspecified: Secondary | ICD-10-CM | POA: Diagnosis not present

## 2023-03-05 ENCOUNTER — Ambulatory Visit: Payer: Self-pay | Admitting: Nurse Practitioner

## 2023-03-08 ENCOUNTER — Ambulatory Visit (INDEPENDENT_AMBULATORY_CARE_PROVIDER_SITE_OTHER): Payer: 59 | Admitting: Nurse Practitioner

## 2023-03-08 ENCOUNTER — Encounter: Payer: Self-pay | Admitting: Nurse Practitioner

## 2023-03-08 VITALS — BP 115/44 | HR 69 | Wt 141.6 lb

## 2023-03-08 DIAGNOSIS — Z940 Kidney transplant status: Secondary | ICD-10-CM

## 2023-03-08 DIAGNOSIS — M549 Dorsalgia, unspecified: Secondary | ICD-10-CM

## 2023-03-08 DIAGNOSIS — I152 Hypertension secondary to endocrine disorders: Secondary | ICD-10-CM | POA: Diagnosis not present

## 2023-03-08 DIAGNOSIS — G8929 Other chronic pain: Secondary | ICD-10-CM | POA: Diagnosis not present

## 2023-03-08 DIAGNOSIS — E782 Mixed hyperlipidemia: Secondary | ICD-10-CM | POA: Diagnosis not present

## 2023-03-08 DIAGNOSIS — E1159 Type 2 diabetes mellitus with other circulatory complications: Secondary | ICD-10-CM | POA: Diagnosis not present

## 2023-03-08 DIAGNOSIS — Z23 Encounter for immunization: Secondary | ICD-10-CM | POA: Diagnosis not present

## 2023-03-08 NOTE — Assessment & Plan Note (Signed)
Patient educated on CDC recommendation for the vaccine. Verbal consent was obtained from the patient, vaccine administered by nurse, no sign of adverse reactions noted at this time. Patient education on arm soreness and use of tylenol  for this patient  was discussed. Patient educated on the signs and symptoms of adverse effect and advise to contact the office if they occur. Vaccine information sheet given to patient.  

## 2023-03-08 NOTE — Assessment & Plan Note (Signed)
BP Readings from Last 3 Encounters:  03/08/23 (!) 115/44  01/12/23 118/62  01/04/23 (!) 146/66  Blood pressure well-controlled on carvedilol 25 mg twice daily, hydralazine 25 mg twice daily, amlodipine 10 mg daily Continue current medications DASH diet advised   Recent A1c was 7.3 She has restarted Tradjenta 5 mg daily Continue current medication Avoid sugar sweets soda Follow-up in 3 months

## 2023-03-08 NOTE — Progress Notes (Signed)
Established Patient Office Visit  Subjective:  Patient ID: Rachael Burke, female    DOB: 1956/05/24  Age: 67 y.o. MRN: [id number]  CC:  Chief Complaint  Patient presents with   Neck Pain   Back Pain    HPI Rachael Burke is a 67 y.o. female  has a past medical history of Anemia, Anxiety, Arthritis, Diabetes mellitus without complication (HCC), Heart murmur, History of blood transfusion, Hyperlipidemia, Hypertension, Kidney transplant status (11/10/2022), Mixed hyperlipidemia (04/08/2019), Pneumonia, PONV (postoperative nausea and vomiting), Renal disorder, and Type 2 diabetes mellitus without obesity (HCC) (01/04/2018).  Patient presents for follow-up for her chronic medical conditions  Please see assessment and plan section for full HPI Patient is accompanied by her daughter who assisted with language translation Patient denies any adverse reactions to current medications Has been following up regularly with the transplant    Lab Results  Component Value Date   CHOL 164 07/14/2022   HDL 53 07/14/2022   LDLCALC 90 07/14/2022   TRIG 115 07/14/2022   CHOLHDL 3.1 07/14/2022       Past Medical History:  Diagnosis Date   Anemia    Anxiety    Arthritis    Diabetes mellitus without complication (HCC)    type 2   Heart murmur    echo 07/02/20: Mild MR/TR, mild-moderate AV sclerosis, bicuspid or functional bicuspid AV, no evidence of AS. Murmr felt due to AV.   History of blood transfusion    Hyperlipidemia    Hypertension    Kidney transplant status 11/10/2022   Mixed hyperlipidemia 04/08/2019   Pneumonia    PONV (postoperative nausea and vomiting)    Renal disorder    M-W-F   Type 2 diabetes mellitus without obesity (HCC) 01/04/2018    Past Surgical History:  Procedure Laterality Date   AV FISTULA PLACEMENT Left 05/20/2020   Procedure: INSERTION OF LEFT ARM ARTERIOVENOUS (AV) FISTULA;  Surgeon: Cephus Shelling, MD;  Location: [facility] OR;   Service: Vascular;  Laterality: Left;   BASCILIC VEIN TRANSPOSITION Left 08/05/2020   Procedure: SECOND STAGE BASILIC VEIN TRANSPOSITION;  Surgeon: Cephus Shelling, MD;  Location: [facility] OR;  Service: Vascular;  Laterality: Left;   BIOPSY  09/22/2021   Procedure: BIOPSY;  Surgeon: Meryl Dare, MD;  Location: [facility] ENDOSCOPY;  Service: Gastroenterology;;   BLADDER REPAIR     nov 2023   CESAREAN SECTION     CHOLECYSTECTOMY     ESOPHAGOGASTRODUODENOSCOPY (EGD) WITH PROPOFOL N/A 09/22/2021   Procedure: ESOPHAGOGASTRODUODENOSCOPY (EGD) WITH PROPOFOL;  Surgeon: Meryl Dare, MD;  Location: [facility] ENDOSCOPY;  Service: Gastroenterology;  Laterality: N/A;   INSERTION OF DIALYSIS CATHETER N/A 05/20/2020   Procedure: INSERTION OF DIALYSIS CATHETER;  Surgeon: Cephus Shelling, MD;  Location: [facility] OR;  Service: Vascular;  Laterality: N/A;   VAGINAL HYSTERECTOMY     nov 2023   VIDEO BRONCHOSCOPY Bilateral 09/26/2021   Procedure: VIDEO BRONCHOSCOPY WITHOUT FLUORO;  Surgeon: Lorin Glass, MD;  Location: [facility] ENDOSCOPY;  Service: Pulmonary;  Laterality: Bilateral;    Family History  Problem Relation Age of Onset   Diabetes Mother    Hypertension Father    Diabetes Sister    Diabetes Brother    Diabetes Brother     Social History   Socioeconomic History   Marital status: Married    Spouse name: Not on file   Number of children: Not on file   Years of education: Not on file   Highest  education level: Not on file  Occupational History   Not on file  Tobacco Use   Smoking status: Never   Smokeless tobacco: Never  Vaping Use   Vaping status: Never Used  Substance and Sexual Activity   Alcohol use: Never   Drug use: Never   Sexual activity: Not on file  Other Topics Concern   Not on file  Social History Narrative   Right handed   Caffeine 1/2 cup daily   One story home   Lives with daughter   retired   Chief Executive Officer Determinants of Corporate investment banker Strain: Not on file   Food Insecurity: Low Risk  (01/20/2023)   Received from [facility]   Hunger Vital Sign    Worried About Running Out of Food in the Last Year: Never true    Ran Out of Food in the Last Year: Never true  Transportation Needs: Not on file (01/20/2023)  Physical Activity: Not on file  Stress: No Stress Concern Present (04/10/2022)   Received from [facility], [facility]   [employer] [facility] - Occupational Stress Questionnaire    Feeling of Stress : Not at all  Social Connections: Unknown (10/16/2021)   Received from [facility], [facility]   Social Network    Social Network: Not on file  Intimate Partner Violence: Not At Risk (06/22/2022)   Humiliation, Afraid, Rape, and Kick questionnaire    Fear of Current or Ex-Partner: No    Emotionally Abused: No    Physically Abused: No    Sexually Abused: No    Outpatient Medications Prior to Visit  Medication Sig Dispense Refill   acetaminophen (TYLENOL) 325 MG tablet Take 2 tablets (650 mg total) by mouth every 6 (six) hours as needed for mild pain or moderate pain. 30 tablet 0   amLODipine (NORVASC) 10 MG tablet Take 1 tablet by mouth daily.     carvedilol (COREG) 25 MG tablet Take 25 mg by mouth 2 (two) times daily.     fluconazole (DIFLUCAN) 100 MG tablet Take 2 tablets by mouth daily.     gabapentin (NEURONTIN) 100 MG capsule Take 300 mg by mouth at bedtime.     hydrALAZINE (APRESOLINE) 25 MG tablet Take 25 mg by mouth 2 (two) times daily.     meclizine (ANTIVERT) 12.5 MG tablet Take 1 tablet (12.5 mg total) by mouth 2 (two) times daily as needed for dizziness. 60 tablet 1   mycophenolate (MYFORTIC) 180 MG EC tablet Take 180 mg by mouth 2 (two) times daily.     omeprazole (PRILOSEC OTC) 20 MG tablet Take 20 mg by mouth daily as needed (indigestion).     ondansetron (ZOFRAN) 4 MG tablet Take 1 tablet (4 mg total) by mouth every 8 (eight) hours as needed for nausea or vomiting. 12 tablet 0   pantoprazole  (PROTONIX) 40 MG tablet Take 40 mg by mouth daily.     polyethylene glycol (MIRALAX / GLYCOLAX) 17 g packet Take 17 g by mouth daily.     predniSONE (DELTASONE) 5 MG tablet Take 5 mg by mouth daily.     tacrolimus (PROGRAF) 1 MG capsule Take 2 mg by mouth 2 (two) times daily.     TRADJENTA 5 MG TABS tablet Take 5 mg by mouth every morning.     carvedilol (COREG) 12.5 MG tablet Take 1 tablet (12.5 mg total) by mouth 2 (two) times daily. 180 tablet 3   isosorbide-hydrALAZINE (BIDIL) 20-37.5 MG tablet  Take 1 tablet by mouth 3 (three) times daily. 90 tablet 2   calcium acetate (PHOSLO) 667 MG capsule Take 667 mg by mouth 3 (three) times daily. (Patient not taking: Reported on 03/08/2023)     HYDROcodone-acetaminophen (NORCO/VICODIN) 5-325 MG tablet Take 1 tablet by mouth every 6 (six) hours as needed for severe pain. (Patient not taking: Reported on 03/08/2023) 10 tablet 0   nitroGLYCERIN (NITROSTAT) 0.4 MG SL tablet Place 1 tablet (0.4 mg total) under the tongue every 5 (five) minutes as needed for chest pain. (Patient not taking: Reported on 03/08/2023) 90 tablet 3   oxyCODONE (ROXICODONE) 5 MG immediate release tablet Take 1 tablet (5 mg total) by mouth every 6 (six) hours as needed for up to 10 doses for breakthrough pain. (Patient not taking: Reported on 03/08/2023) 10 tablet 0   No facility-administered medications prior to visit.    Allergies  Allergen Reactions   Ceftriaxone     ROS Review of Systems  Constitutional:  Negative for activity change, appetite change, chills, fatigue and fever.  HENT:  Negative for congestion, dental problem, ear discharge, ear pain, hearing loss, rhinorrhea, sinus pressure, sinus pain, sneezing and sore throat.   Eyes:  Negative for pain, discharge, redness and itching.  Respiratory:  Negative for cough, chest tightness, shortness of breath and wheezing.   Cardiovascular:  Negative for chest pain, palpitations and leg swelling.  Gastrointestinal:  Negative  for abdominal distention, abdominal pain, anal bleeding, blood in stool, constipation, diarrhea, nausea, rectal pain and vomiting.  Endocrine: Negative for cold intolerance, heat intolerance, polydipsia, polyphagia and polyuria.  Genitourinary:  Negative for difficulty urinating, dysuria, flank pain, frequency, hematuria, menstrual problem, pelvic pain and vaginal bleeding.  Musculoskeletal:  Positive for back pain. Negative for arthralgias, gait problem, joint swelling and myalgias.  Skin:  Negative for color change, pallor, rash and wound.  Allergic/Immunologic: Negative for environmental allergies, food allergies and immunocompromised state.  Neurological:  Negative for dizziness, tremors, facial asymmetry, weakness and headaches.  Hematological:  Negative for adenopathy. Does not bruise/bleed easily.  Psychiatric/Behavioral:  Negative for agitation, behavioral problems, confusion, decreased concentration, hallucinations, self-injury and suicidal ideas.       Objective:    Physical Exam Vitals and nursing note reviewed.  Constitutional:      General: She is not in acute distress.    Appearance: She is not ill-appearing, toxic-appearing or diaphoretic.  HENT:     Mouth/Throat:     Mouth: Mucous membranes are moist.     Pharynx: Oropharynx is clear. No oropharyngeal exudate or posterior oropharyngeal erythema.  Eyes:     General: No scleral icterus.       Right eye: No discharge.        Left eye: No discharge.     Extraocular Movements: Extraocular movements intact.     Conjunctiva/sclera: Conjunctivae normal.  Cardiovascular:     Rate and Rhythm: Normal rate and regular rhythm.     Pulses: Normal pulses.     Heart sounds: Normal heart sounds. No murmur heard.    No friction rub. No gallop.  Pulmonary:     Effort: Pulmonary effort is normal. No respiratory distress.     Breath sounds: Normal breath sounds. No stridor. No wheezing, rhonchi or rales.  Chest:     Chest wall: No  tenderness.  Abdominal:     General: There is no distension.     Palpations: Abdomen is soft.     Tenderness: There is no abdominal tenderness.  There is no right CVA tenderness, left CVA tenderness or guarding.  Musculoskeletal:        General: No swelling, tenderness, deformity or signs of injury.     Right lower leg: No edema.     Left lower leg: No edema.  Skin:    General: Skin is warm and dry.     Capillary Refill: Capillary refill takes less than 2 seconds.     Coloration: Skin is not jaundiced or pale.     Findings: No bruising, erythema or lesion.  Neurological:     Mental Status: She is alert and oriented to person, place, and time.     Motor: No weakness.     Coordination: Coordination normal.     Gait: Gait normal.  Psychiatric:        Mood and Affect: Mood normal.        Behavior: Behavior normal.        Thought Content: Thought content normal.        Judgment: Judgment normal.     BP (!) 115/44   Pulse 69   Wt 141 lb 9.6 oz (64.2 kg)   SpO2 100%   BMI 27.65 kg/m  Wt Readings from Last 3 Encounters:  03/08/23 141 lb 9.6 oz (64.2 kg)  10/29/22 148 lb (67.1 kg)  10/27/22 145 lb 8.1 oz (66 kg)    Lab Results  Component Value Date   TSH 2.67 06/26/2022   Lab Results  Component Value Date   WBC 9.3 01/12/2023   HGB 9.0 (L) 01/12/2023   HCT 27.1 (L) 01/12/2023   MCV 94.8 01/12/2023   PLT 225 01/12/2023   Lab Results  Component Value Date   NA 128 (L) 01/12/2023   K 5.4 (H) 01/12/2023   CO2 20 (L) 01/12/2023   GLUCOSE 201 (H) 01/12/2023   BUN 26 (H) 01/12/2023   CREATININE 1.50 (H) 01/12/2023   BILITOT 0.5 01/12/2023   ALKPHOS 77 01/12/2023   AST 13 (L) 01/12/2023   ALT 11 01/12/2023   PROT 7.1 01/12/2023   ALBUMIN 4.2 01/12/2023   CALCIUM 8.7 (L) 01/12/2023   ANIONGAP 10 01/12/2023   Lab Results  Component Value Date   CHOL 164 07/14/2022   Lab Results  Component Value Date   HDL 53 07/14/2022   Lab Results  Component Value Date    LDLCALC 90 07/14/2022   Lab Results  Component Value Date   TRIG 115 07/14/2022   Lab Results  Component Value Date   CHOLHDL 3.1 07/14/2022   Lab Results  Component Value Date   HGBA1C 5.0 07/14/2022      Assessment & Plan:   Problem List Items Addressed This Visit       Cardiovascular and Mediastinum   Hypertension associated with diabetes (HCC)    BP Readings from Last 3 Encounters:  03/08/23 (!) 115/44  01/12/23 118/62  01/04/23 (!) 146/66  Blood pressure well-controlled on carvedilol 25 mg twice daily, hydralazine 25 mg twice daily, amlodipine 10 mg daily Continue current medications DASH diet advised   Recent A1c was 7.3 She has restarted Tradjenta 5 mg daily Continue current medication Avoid sugar sweets soda Follow-up in 3 months      Relevant Medications   hydrALAZINE (APRESOLINE) 25 MG tablet   TRADJENTA 5 MG TABS tablet   carvedilol (COREG) 25 MG tablet     Other   Mixed hyperlipidemia    Lab Results  Component Value Date   CHOL 164  07/14/2022   HDL 53 07/14/2022   LDLCALC 90 07/14/2022   TRIG 115 07/14/2022   CHOLHDL 3.1 07/14/2022  Currently not on a statin Has restarted medication for type 2 diabetes Checking l direct LDL, LDL goal is less than 70      Relevant Medications   hydrALAZINE (APRESOLINE) 25 MG tablet   carvedilol (COREG) 25 MG tablet   Other Relevant Orders   Lipid panel   Kidney transplant status    Status post kidney transplant Stated that she has been doing well since her kidney transplant Continue Prograf 2 mg twice daily, mycophenolate 100 mg twice daily Encouraged to maintain close follow-up with the transplant team      Relevant Medications   mycophenolate (MYFORTIC) 180 MG EC tablet   predniSONE (DELTASONE) 5 MG tablet   tacrolimus (PROGRAF) 1 MG capsule   Upper back pain, chronic    Currently has aching pain 6/10 Has been taking gabapentin 100 mg daily as needed Patient encouraged to take gabapentin 100 mg  daily.  May increase to 100 mg twice daily if no improvement Application of heating pad encouraged      Relevant Medications   predniSONE (DELTASONE) 5 MG tablet   Need for influenza vaccination - Primary    Patient educated on CDC recommendation for the vaccine. Verbal consent was obtained from the patient, vaccine administered by nurse, no sign of adverse reactions noted at this time. Patient education on arm soreness and use of tylenol for this patient  was discussed. Patient educated on the signs and symptoms of adverse effect and advise to contact the office if they occur. Vaccine information sheet given to patient.        Relevant Orders   Flu Vaccine QUAD High Dose(Fluad) (Completed)    No orders of the defined types were placed in this encounter.   Follow-up: Return in about 3 months (around 06/08/2023) for HYPERLIPIDEMIA, DM, FASTING LABS THIS WEEK.    Donell Beers, FNP

## 2023-03-08 NOTE — Assessment & Plan Note (Signed)
Recent A1c was 7.3 She has restarted Tradjenta 5 mg daily Continue current medication Avoid sugar sweets soda Follow-up in 3 months

## 2023-03-08 NOTE — Patient Instructions (Signed)
Please take gabapentin 100 mg daily, can increase to 100 mg 2 times daily if needed.  Continue Tylenol okay to take 650 mg every 6 hours as needed for pain.  Also encouraged to use of heating pad.  It is important that you exercise regularly at least 30 minutes 5 times a week as tolerated  Think about what you will eat, plan ahead. Choose " clean, green, fresh or frozen" over canned, processed or packaged foods which are more sugary, salty and fatty. 70 to 75% of food eaten should be vegetables and fruit. Three meals at set times with snacks allowed between meals, but they must be fruit or vegetables. Aim to eat over a 12 hour period , example 7 am to 7 pm, and STOP after  your last meal of the day. Drink water,generally about 64 ounces per day, no other drink is as healthy. Fruit juice is best enjoyed in a healthy way, by EATING the fruit.  Thanks for choosing [facility] we consider it a privelige to serve you.

## 2023-03-08 NOTE — Assessment & Plan Note (Signed)
Status post kidney transplant Stated that she has been doing well since her kidney transplant Continue Prograf 2 mg twice daily, mycophenolate 100 mg twice daily Encouraged to maintain close follow-up with the transplant team

## 2023-03-08 NOTE — Assessment & Plan Note (Signed)
Lab Results  Component Value Date   CHOL 164 07/14/2022   HDL 53 07/14/2022   LDLCALC 90 07/14/2022   TRIG 115 07/14/2022   CHOLHDL 3.1 07/14/2022  Currently not on a statin Has restarted medication for type 2 diabetes Checking l direct LDL, LDL goal is less than 70

## 2023-03-08 NOTE — Assessment & Plan Note (Signed)
Currently has aching pain 6/10 Has been taking gabapentin 100 mg daily as needed Patient encouraged to take gabapentin 100 mg daily.  May increase to 100 mg twice daily if no improvement Application of heating pad encouraged

## 2023-03-12 ENCOUNTER — Other Ambulatory Visit: Payer: Self-pay

## 2023-03-12 DIAGNOSIS — D84821 Immunodeficiency due to drugs: Secondary | ICD-10-CM | POA: Diagnosis not present

## 2023-03-12 DIAGNOSIS — E1121 Type 2 diabetes mellitus with diabetic nephropathy: Secondary | ICD-10-CM | POA: Diagnosis not present

## 2023-03-12 DIAGNOSIS — E119 Type 2 diabetes mellitus without complications: Secondary | ICD-10-CM | POA: Diagnosis not present

## 2023-03-12 DIAGNOSIS — Z5181 Encounter for therapeutic drug level monitoring: Secondary | ICD-10-CM | POA: Diagnosis not present

## 2023-03-12 DIAGNOSIS — Z4822 Encounter for aftercare following kidney transplant: Secondary | ICD-10-CM | POA: Diagnosis not present

## 2023-03-12 DIAGNOSIS — R1310 Dysphagia, unspecified: Secondary | ICD-10-CM | POA: Diagnosis not present

## 2023-03-12 DIAGNOSIS — E875 Hyperkalemia: Secondary | ICD-10-CM | POA: Diagnosis not present

## 2023-03-12 DIAGNOSIS — E872 Acidosis, unspecified: Secondary | ICD-10-CM | POA: Diagnosis not present

## 2023-03-12 DIAGNOSIS — M545 Low back pain, unspecified: Secondary | ICD-10-CM | POA: Diagnosis not present

## 2023-03-12 DIAGNOSIS — M549 Dorsalgia, unspecified: Secondary | ICD-10-CM | POA: Diagnosis not present

## 2023-03-12 DIAGNOSIS — I10 Essential (primary) hypertension: Secondary | ICD-10-CM | POA: Diagnosis not present

## 2023-03-12 DIAGNOSIS — Z940 Kidney transplant status: Secondary | ICD-10-CM | POA: Diagnosis not present

## 2023-03-12 DIAGNOSIS — E785 Hyperlipidemia, unspecified: Secondary | ICD-10-CM | POA: Diagnosis not present

## 2023-03-12 DIAGNOSIS — D649 Anemia, unspecified: Secondary | ICD-10-CM | POA: Diagnosis not present

## 2023-03-12 DIAGNOSIS — K5909 Other constipation: Secondary | ICD-10-CM | POA: Diagnosis not present

## 2023-03-12 DIAGNOSIS — G8929 Other chronic pain: Secondary | ICD-10-CM | POA: Diagnosis not present

## 2023-03-15 ENCOUNTER — Other Ambulatory Visit: Payer: 59

## 2023-03-15 DIAGNOSIS — E782 Mixed hyperlipidemia: Secondary | ICD-10-CM

## 2023-03-16 ENCOUNTER — Other Ambulatory Visit: Payer: Self-pay | Admitting: Nurse Practitioner

## 2023-03-16 DIAGNOSIS — E785 Hyperlipidemia, unspecified: Secondary | ICD-10-CM

## 2023-03-16 LAB — LIPID PANEL
Chol/HDL Ratio: 4.4 {ratio} (ref 0.0–4.4)
Cholesterol, Total: 231 mg/dL — ABNORMAL HIGH (ref 100–199)
HDL: 53 mg/dL (ref >39)
LDL Chol Calc (NIH): 127 mg/dL — ABNORMAL HIGH (ref 0–99)
Triglycerides: 290 mg/dL — ABNORMAL HIGH (ref 0–149)
VLDL Cholesterol Cal: 51 mg/dL — ABNORMAL HIGH (ref 5–40)

## 2023-03-16 MED ORDER — ATORVASTATIN CALCIUM 20 MG PO TABS
20.0000 mg | ORAL_TABLET | Freq: Every day | ORAL | 1 refills | Status: DC
Start: 2023-03-16 — End: 2023-06-06

## 2023-03-26 DIAGNOSIS — Z4822 Encounter for aftercare following kidney transplant: Secondary | ICD-10-CM | POA: Diagnosis not present

## 2023-03-26 DIAGNOSIS — D84821 Immunodeficiency due to drugs: Secondary | ICD-10-CM | POA: Diagnosis not present

## 2023-03-26 DIAGNOSIS — E119 Type 2 diabetes mellitus without complications: Secondary | ICD-10-CM | POA: Diagnosis not present

## 2023-03-26 DIAGNOSIS — E875 Hyperkalemia: Secondary | ICD-10-CM | POA: Diagnosis not present

## 2023-03-26 DIAGNOSIS — M545 Low back pain, unspecified: Secondary | ICD-10-CM | POA: Diagnosis not present

## 2023-03-26 DIAGNOSIS — M25551 Pain in right hip: Secondary | ICD-10-CM | POA: Diagnosis not present

## 2023-03-26 DIAGNOSIS — Z940 Kidney transplant status: Secondary | ICD-10-CM | POA: Diagnosis not present

## 2023-03-26 DIAGNOSIS — R1310 Dysphagia, unspecified: Secondary | ICD-10-CM | POA: Diagnosis not present

## 2023-03-26 DIAGNOSIS — Z5181 Encounter for therapeutic drug level monitoring: Secondary | ICD-10-CM | POA: Diagnosis not present

## 2023-03-26 DIAGNOSIS — K5900 Constipation, unspecified: Secondary | ICD-10-CM | POA: Diagnosis not present

## 2023-03-26 DIAGNOSIS — D649 Anemia, unspecified: Secondary | ICD-10-CM | POA: Diagnosis not present

## 2023-03-26 DIAGNOSIS — I10 Essential (primary) hypertension: Secondary | ICD-10-CM | POA: Diagnosis not present

## 2023-03-26 DIAGNOSIS — E785 Hyperlipidemia, unspecified: Secondary | ICD-10-CM | POA: Diagnosis not present

## 2023-03-26 DIAGNOSIS — Z79621 Long term (current) use of calcineurin inhibitor: Secondary | ICD-10-CM | POA: Diagnosis not present

## 2023-03-30 DIAGNOSIS — M545 Low back pain, unspecified: Secondary | ICD-10-CM | POA: Diagnosis not present

## 2023-03-30 DIAGNOSIS — G8929 Other chronic pain: Secondary | ICD-10-CM | POA: Diagnosis not present

## 2023-03-31 DIAGNOSIS — H3562 Retinal hemorrhage, left eye: Secondary | ICD-10-CM | POA: Diagnosis not present

## 2023-04-09 DIAGNOSIS — E113591 Type 2 diabetes mellitus with proliferative diabetic retinopathy without macular edema, right eye: Secondary | ICD-10-CM | POA: Diagnosis not present

## 2023-04-22 ENCOUNTER — Ambulatory Visit: Payer: 59 | Attending: Cardiology | Admitting: Cardiology

## 2023-04-23 ENCOUNTER — Encounter: Payer: Self-pay | Admitting: Cardiology

## 2023-04-23 DIAGNOSIS — Z5181 Encounter for therapeutic drug level monitoring: Secondary | ICD-10-CM | POA: Diagnosis not present

## 2023-04-23 DIAGNOSIS — Z940 Kidney transplant status: Secondary | ICD-10-CM | POA: Diagnosis not present

## 2023-04-23 DIAGNOSIS — Z79899 Other long term (current) drug therapy: Secondary | ICD-10-CM | POA: Diagnosis not present

## 2023-04-23 DIAGNOSIS — Z949 Transplanted organ and tissue status, unspecified: Secondary | ICD-10-CM | POA: Diagnosis not present

## 2023-04-23 DIAGNOSIS — K219 Gastro-esophageal reflux disease without esophagitis: Secondary | ICD-10-CM | POA: Diagnosis not present

## 2023-04-23 DIAGNOSIS — Z79621 Long term (current) use of calcineurin inhibitor: Secondary | ICD-10-CM | POA: Diagnosis not present

## 2023-04-23 DIAGNOSIS — Z4822 Encounter for aftercare following kidney transplant: Secondary | ICD-10-CM | POA: Diagnosis not present

## 2023-04-24 ENCOUNTER — Other Ambulatory Visit: Payer: Self-pay | Admitting: Internal Medicine

## 2023-05-05 DIAGNOSIS — Z5181 Encounter for therapeutic drug level monitoring: Secondary | ICD-10-CM | POA: Diagnosis not present

## 2023-05-05 DIAGNOSIS — Z949 Transplanted organ and tissue status, unspecified: Secondary | ICD-10-CM | POA: Diagnosis not present

## 2023-05-05 DIAGNOSIS — R42 Dizziness and giddiness: Secondary | ICD-10-CM | POA: Diagnosis not present

## 2023-05-05 DIAGNOSIS — Z79899 Other long term (current) drug therapy: Secondary | ICD-10-CM | POA: Diagnosis not present

## 2023-05-05 DIAGNOSIS — Z940 Kidney transplant status: Secondary | ICD-10-CM | POA: Diagnosis not present

## 2023-05-05 DIAGNOSIS — Z79621 Long term (current) use of calcineurin inhibitor: Secondary | ICD-10-CM | POA: Diagnosis not present

## 2023-05-05 DIAGNOSIS — Z4822 Encounter for aftercare following kidney transplant: Secondary | ICD-10-CM | POA: Diagnosis not present

## 2023-05-12 ENCOUNTER — Other Ambulatory Visit: Payer: Self-pay

## 2023-06-03 ENCOUNTER — Other Ambulatory Visit: Payer: Self-pay | Admitting: Nurse Practitioner

## 2023-06-03 DIAGNOSIS — E785 Hyperlipidemia, unspecified: Secondary | ICD-10-CM

## 2023-06-07 DIAGNOSIS — K5909 Other constipation: Secondary | ICD-10-CM | POA: Diagnosis not present

## 2023-06-07 DIAGNOSIS — E1121 Type 2 diabetes mellitus with diabetic nephropathy: Secondary | ICD-10-CM | POA: Diagnosis not present

## 2023-06-07 DIAGNOSIS — B389 Coccidioidomycosis, unspecified: Secondary | ICD-10-CM | POA: Diagnosis not present

## 2023-06-07 DIAGNOSIS — N390 Urinary tract infection, site not specified: Secondary | ICD-10-CM | POA: Diagnosis not present

## 2023-06-07 DIAGNOSIS — Z13220 Encounter for screening for lipoid disorders: Secondary | ICD-10-CM | POA: Diagnosis not present

## 2023-06-07 DIAGNOSIS — Z940 Kidney transplant status: Secondary | ICD-10-CM | POA: Diagnosis not present

## 2023-06-07 DIAGNOSIS — D849 Immunodeficiency, unspecified: Secondary | ICD-10-CM | POA: Diagnosis not present

## 2023-06-08 ENCOUNTER — Ambulatory Visit: Payer: Self-pay | Admitting: Nurse Practitioner

## 2023-06-08 DIAGNOSIS — R519 Headache, unspecified: Secondary | ICD-10-CM | POA: Diagnosis not present

## 2023-06-08 DIAGNOSIS — R42 Dizziness and giddiness: Secondary | ICD-10-CM | POA: Diagnosis not present

## 2023-06-08 DIAGNOSIS — M542 Cervicalgia: Secondary | ICD-10-CM | POA: Diagnosis not present

## 2023-06-09 DIAGNOSIS — N186 End stage renal disease: Secondary | ICD-10-CM | POA: Diagnosis not present

## 2023-06-10 ENCOUNTER — Ambulatory Visit: Payer: Self-pay | Admitting: Nurse Practitioner

## 2023-06-11 DIAGNOSIS — H90A22 Sensorineural hearing loss, unilateral, left ear, with restricted hearing on the contralateral side: Secondary | ICD-10-CM | POA: Diagnosis not present

## 2023-06-11 DIAGNOSIS — H93292 Other abnormal auditory perceptions, left ear: Secondary | ICD-10-CM | POA: Diagnosis not present

## 2023-07-16 DIAGNOSIS — Z940 Kidney transplant status: Secondary | ICD-10-CM | POA: Diagnosis not present

## 2023-07-16 DIAGNOSIS — D849 Immunodeficiency, unspecified: Secondary | ICD-10-CM | POA: Diagnosis not present

## 2023-07-16 DIAGNOSIS — D229 Melanocytic nevi, unspecified: Secondary | ICD-10-CM | POA: Diagnosis not present

## 2023-07-16 DIAGNOSIS — L658 Other specified nonscarring hair loss: Secondary | ICD-10-CM | POA: Diagnosis not present

## 2023-07-19 DIAGNOSIS — D84821 Immunodeficiency due to drugs: Secondary | ICD-10-CM | POA: Diagnosis not present

## 2023-07-19 DIAGNOSIS — Z4822 Encounter for aftercare following kidney transplant: Secondary | ICD-10-CM | POA: Diagnosis not present

## 2023-07-19 DIAGNOSIS — Z79621 Long term (current) use of calcineurin inhibitor: Secondary | ICD-10-CM | POA: Diagnosis not present

## 2023-07-19 DIAGNOSIS — Z5181 Encounter for therapeutic drug level monitoring: Secondary | ICD-10-CM | POA: Diagnosis not present

## 2023-07-19 DIAGNOSIS — D849 Immunodeficiency, unspecified: Secondary | ICD-10-CM | POA: Diagnosis not present

## 2023-07-19 DIAGNOSIS — Z940 Kidney transplant status: Secondary | ICD-10-CM | POA: Diagnosis not present

## 2023-07-19 DIAGNOSIS — Z79899 Other long term (current) drug therapy: Secondary | ICD-10-CM | POA: Diagnosis not present

## 2023-07-19 DIAGNOSIS — Z949 Transplanted organ and tissue status, unspecified: Secondary | ICD-10-CM | POA: Diagnosis not present

## 2023-07-28 DIAGNOSIS — Z992 Dependence on renal dialysis: Secondary | ICD-10-CM | POA: Diagnosis not present

## 2023-07-28 DIAGNOSIS — G43909 Migraine, unspecified, not intractable, without status migrainosus: Secondary | ICD-10-CM | POA: Diagnosis not present

## 2023-07-28 DIAGNOSIS — R42 Dizziness and giddiness: Secondary | ICD-10-CM | POA: Diagnosis not present

## 2023-07-28 DIAGNOSIS — Z940 Kidney transplant status: Secondary | ICD-10-CM | POA: Diagnosis not present

## 2023-08-05 ENCOUNTER — Encounter: Payer: Self-pay | Admitting: Podiatry

## 2023-08-05 ENCOUNTER — Ambulatory Visit (INDEPENDENT_AMBULATORY_CARE_PROVIDER_SITE_OTHER): Admitting: Podiatry

## 2023-08-05 DIAGNOSIS — L600 Ingrowing nail: Secondary | ICD-10-CM | POA: Diagnosis not present

## 2023-08-05 DIAGNOSIS — B351 Tinea unguium: Secondary | ICD-10-CM

## 2023-08-05 MED ORDER — DOXYCYCLINE HYCLATE 100 MG PO TABS: 100.0000 mg | ORAL_TABLET | Freq: Two times a day (BID) | ORAL | 0 refills | Status: DC

## 2023-08-05 NOTE — Patient Instructions (Signed)
Instrucciones de remojo  El dia despues del procedimiento:  Coloque 1/4 taza de sal de epsom en un litro de agua tibia del grifo. Sumerja su pie o pies con el vendaje externo intacto para el remojo inicial; esto permitira que el vendaje se humdezca y humedezca  para despegarlo facilmente. Una ves que retire su vendaje, continue remojando la solucion durante 20 minutos. Este remojo deber hacerse dos veces al dia. Luego, retire su pie o pies de la solucion, seque el area afectada y cubra. Puede usar una curita lo suficientemente grande como para cubrir el area o usar una gasa y cinta adhesive. Aplique otros medicamentos en el area segun las indicaciones del medico, como la polisporina neosporina.    SI SU PIEL SE IRRITA AL USAR ESTAS INSTRUCCIONES, ES ACEPTABLE CAMBIARSE AL VINAGRE BLANCO Y AL AGUA. O puede usar agua y jabon antibacterial para mantener limpio el dedo del pie.   Monitoree cualquier signo/sintoma de infeccion. Llame a la oficina de inmediato si occure o vaya directament a la sal de emergencias. Llame con cualquier pregunta/inquitud.  Instrucciones de cuidado a largo plazco: cirugia post clavo; Le han tratado la una encarnada y la raiz con un quimico. Este quimico causa una quemadura que drenara y supurara como una ampolla. Esto puede drenar durante 6-8 semana o mas. Es importante mantener esta area limpia, cubierta y seguir las intrucciones de remojo distribuidas al momento de la cirugia. Esta area finalmente se secara y formara una costra. Una ves que se forma la costra, ya no necesita remojar o aplicar un aposito. Si en algun momento experimenta un aumento en el dolor, enrojecimiento, hinchazon o drenaje, debe comunicarse con la oficina lo antes posible.   

## 2023-08-05 NOTE — Progress Notes (Signed)
 Subjective:  Patient ID: Rachael Burke, female    DOB: [birth year],  MRN: [id number]  Rachael Burke presents to clinic today for:  Chief Complaint  Patient presents with   Ingrown Toenail    Patient states that she is having pain in left foot hallux and right foot hallux, this has been going on since Friday and she has had some drainage come out her bilateral hallux's. Patient is not taking no medication for pain.  Patient presenting for above complaint.  She has been dealing with chronic recurrent ingrown toenails.  This is affecting both great toes, both borders.  States that she noticed some drainage concerning for pus to the left first toe starting 2/28.  Patient is diabetic.  Last A1c 5.0.  History of kidney transplant.  PCP is Paseda, Baird Kay, FNP.  Allergies  Allergen Reactions   Ceftriaxone     Review of Systems: Negative except as noted in the HPI.  Objective:  There were no vitals filed for this visit.  Rachael Burke is a pleasant 68 y.o. female in NAD. AAO x 3.  Vascular Examination: Capillary refill time is 3-5 seconds to toes bilateral. Palpable pedal pulses b/l LE. Digital hair present b/l. No pedal edema b/l. Skin temperature gradient WNL b/l. No varicosities b/l. No cyanosis or clubbing noted b/l.   Dermatological Examination: There is incurvation of the bilateral hallux bilateral nail border.  There is pain on palpation of the affected nail border.  Some localized inflammation present.  No erythema or active drainage today.  Some dystrophic changes to the nails noted.  Neurological Examination: Protective sensation intact with Semmes-Weinstein 10 gram monofilament b/l LE. Vibratory sensation intact b/l LE.      No data to display           Assessment/Plan: 1. Ingrown toenail of both feet   2. Onychomycosis     Meds ordered this encounter  Medications   doxycycline (VIBRA-TABS) 100 MG tablet    Sig: Take 1  tablet (100 mg total) by mouth 2 (two) times daily for 7 days.    Dispense:  14 tablet    Refill:  0    Discussed patient's condition today.  After obtaining patient consent, the bilateral hallux was anesthetized with a 50:50 mixture of 1% lidocaine plain and 0.5% bupivacaine plain for a total of 3cc's administered for each toe.  Upon confirmation of anesthesia, a freer elevator was utilized to free the bilateral nail border from the left and right hallux nail bed.  The nail borders were then avulsed proximal to the eponychium and removed in toto.  The area was inspected for any remaining spicules.  A chemical matrixectomy was performed with Phenol and neutralized with isopropyl alcohol solution.  Antibiotic ointment and a DSD were applied, followed by a Coban dressing.  Patient tolerated the anesthetic and procedure well and will f/u in 2-3 weeks for recheck.  Patient given post-procedure instructions for daily 15-minute Epsom salt soaks, antibiotic ointment and daily use of Bandaids until toe starts to dry / form eschar.   No signs of acute bacterial infection noted today however due to history of kidney transplant, prescribing patient 1 week of oral doxycycline as a precaution.  She would like to discuss treatment for possible onychomycosis at follow-up.  Return in about 2 weeks (around 08/19/2023) for Nail Check.   Rachael Burke L. Marchia Bond, AACFAS [facility]  [address] [place], [place] [address]                Office [phone]  Fax [phone]

## 2023-08-06 DIAGNOSIS — D849 Immunodeficiency, unspecified: Secondary | ICD-10-CM | POA: Diagnosis not present

## 2023-08-06 DIAGNOSIS — Z5181 Encounter for therapeutic drug level monitoring: Secondary | ICD-10-CM | POA: Diagnosis not present

## 2023-08-06 DIAGNOSIS — I10 Essential (primary) hypertension: Secondary | ICD-10-CM | POA: Diagnosis not present

## 2023-08-06 DIAGNOSIS — Z79621 Long term (current) use of calcineurin inhibitor: Secondary | ICD-10-CM | POA: Diagnosis not present

## 2023-08-06 DIAGNOSIS — B389 Coccidioidomycosis, unspecified: Secondary | ICD-10-CM | POA: Diagnosis not present

## 2023-08-06 DIAGNOSIS — E1121 Type 2 diabetes mellitus with diabetic nephropathy: Secondary | ICD-10-CM | POA: Diagnosis not present

## 2023-08-06 DIAGNOSIS — Z940 Kidney transplant status: Secondary | ICD-10-CM | POA: Diagnosis not present

## 2023-08-06 DIAGNOSIS — I152 Hypertension secondary to endocrine disorders: Secondary | ICD-10-CM | POA: Diagnosis not present

## 2023-08-06 DIAGNOSIS — Z4822 Encounter for aftercare following kidney transplant: Secondary | ICD-10-CM | POA: Diagnosis not present

## 2023-08-09 ENCOUNTER — Telehealth: Payer: Self-pay | Admitting: Podiatry

## 2023-08-09 ENCOUNTER — Other Ambulatory Visit: Payer: Self-pay | Admitting: Podiatry

## 2023-08-09 ENCOUNTER — Encounter: Payer: Self-pay | Admitting: Podiatry

## 2023-08-09 DIAGNOSIS — L600 Ingrowing nail: Secondary | ICD-10-CM

## 2023-08-09 MED ORDER — DOXYCYCLINE HYCLATE 100 MG PO TABS: 100.0000 mg | ORAL_TABLET | Freq: Two times a day (BID) | ORAL | 0 refills | Status: AC

## 2023-08-09 NOTE — Telephone Encounter (Signed)
 Daughter Rachael Burke) and Patient is currently in [place] and forgot to take antibiotics medication on trip. Daughter is requesting to have prescription for antibiotics filled at pharmacy in [place]. [facility]; Address: [address] [place], [place] [address]/Pharmacy telephone number; [phone]

## 2023-08-09 NOTE — Progress Notes (Signed)
Medication sent into patient's pharmacy  

## 2023-08-26 DIAGNOSIS — Z9181 History of falling: Secondary | ICD-10-CM | POA: Diagnosis not present

## 2023-08-26 DIAGNOSIS — R42 Dizziness and giddiness: Secondary | ICD-10-CM | POA: Diagnosis not present

## 2023-08-26 DIAGNOSIS — H832X2 Labyrinthine dysfunction, left ear: Secondary | ICD-10-CM | POA: Diagnosis not present

## 2023-08-27 DIAGNOSIS — R300 Dysuria: Secondary | ICD-10-CM | POA: Diagnosis not present

## 2023-08-27 DIAGNOSIS — N390 Urinary tract infection, site not specified: Secondary | ICD-10-CM | POA: Diagnosis not present

## 2023-08-27 DIAGNOSIS — R051 Acute cough: Secondary | ICD-10-CM | POA: Diagnosis not present

## 2023-08-30 ENCOUNTER — Ambulatory Visit: Admitting: Physical Therapy

## 2023-09-02 ENCOUNTER — Encounter: Payer: Self-pay | Admitting: Podiatry

## 2023-09-02 ENCOUNTER — Ambulatory Visit (INDEPENDENT_AMBULATORY_CARE_PROVIDER_SITE_OTHER): Admitting: Podiatry

## 2023-09-02 DIAGNOSIS — L03039 Cellulitis of unspecified toe: Secondary | ICD-10-CM | POA: Diagnosis not present

## 2023-09-02 DIAGNOSIS — L600 Ingrowing nail: Secondary | ICD-10-CM | POA: Diagnosis not present

## 2023-09-02 DIAGNOSIS — L02619 Cutaneous abscess of unspecified foot: Secondary | ICD-10-CM

## 2023-09-02 MED ORDER — DOXYCYCLINE HYCLATE 100 MG PO TABS: 100.0000 mg | ORAL_TABLET | Freq: Two times a day (BID) | ORAL | 0 refills | Status: AC

## 2023-09-02 NOTE — Progress Notes (Signed)
       Subjective:  Patient ID: Rachael Burke, female    DOB: [birth year],  MRN: [id number]  Chief Complaint  Patient presents with   Nail Problem    " I have some pain if my sandel hits the right big toe, my left toe does not hurt that much,     Rachael Burke presents to clinic today for f/u of PNA to the bilateral hallux both borders.  Reports soaking the toes remotely.  Has not noticed any drainage.  Does complain of some residual redness to the base of the first toes.  She does still have some pain to the borders of the right first toenail though the left side is doing well.  PCP is Paseda, Baird Kay, FNP.  Allergies  Allergen Reactions   Ceftriaxone     Objective:  There were no vitals filed for this visit.  Vascular Examination: Capillary refill time is 3-5 seconds to toes bilateral. Palpable pedal pulses b/l LE. Digital hair present b/l. No pedal edema b/l. Skin temperature gradient WNL b/l. No varicosities b/l. No cyanosis or clubbing noted b/l.   Dermatological Examination: Upon inspection of the PNA site, there is residual erythema to the base the nail plate with mild edema bilaterally.  There is some tenderness on palpation the right hallux bilateral borders.  Left hallux no significant tenderness.  No active drainage today.  Bilateral sites continuing to heal without signs of drainage, stable scab has not fully formed yet  Assessment/Plan: 1. Ingrown toenail of both feet     Meds ordered this encounter  Medications   doxycycline (VIBRA-TABS) 100 MG tablet    Sig: Take 1 tablet (100 mg total) by mouth 2 (two) times daily for 10 days.    Dispense:  20 tablet    Refill:  0   Clinical findings and treatment plan reviewed with patient.  Starting today course of oral doxycycline due to some residual redness.  Continue to bandage the toes.  Avoid antibiotic ointment for now as this can cause the areas to be slow to heal.  Follow-up in 2 weeks if  there is any residual redness, swelling or if there is continued pain.  Follow-up x-rays at this time.  Return in about 2 weeks (around 09/16/2023), or if symptoms worsen or fail to improve, for Nail Check.   Sheanna Dail L. Marchia Bond, AACFAS [facility]     [address] [place], [place] [address]                Office [phone]  Fax [phone]

## 2023-09-07 DIAGNOSIS — D849 Immunodeficiency, unspecified: Secondary | ICD-10-CM | POA: Diagnosis not present

## 2023-09-07 DIAGNOSIS — D649 Anemia, unspecified: Secondary | ICD-10-CM | POA: Diagnosis not present

## 2023-09-07 DIAGNOSIS — E1121 Type 2 diabetes mellitus with diabetic nephropathy: Secondary | ICD-10-CM | POA: Diagnosis not present

## 2023-09-07 DIAGNOSIS — I152 Hypertension secondary to endocrine disorders: Secondary | ICD-10-CM | POA: Diagnosis not present

## 2023-09-07 DIAGNOSIS — R197 Diarrhea, unspecified: Secondary | ICD-10-CM | POA: Diagnosis not present

## 2023-09-07 DIAGNOSIS — Z5181 Encounter for therapeutic drug level monitoring: Secondary | ICD-10-CM | POA: Diagnosis not present

## 2023-09-07 DIAGNOSIS — Z4822 Encounter for aftercare following kidney transplant: Secondary | ICD-10-CM | POA: Diagnosis not present

## 2023-09-07 DIAGNOSIS — Z940 Kidney transplant status: Secondary | ICD-10-CM | POA: Diagnosis not present

## 2023-09-07 DIAGNOSIS — R1310 Dysphagia, unspecified: Secondary | ICD-10-CM | POA: Diagnosis not present

## 2023-09-07 DIAGNOSIS — Z79621 Long term (current) use of calcineurin inhibitor: Secondary | ICD-10-CM | POA: Diagnosis not present

## 2023-09-07 DIAGNOSIS — E8729 Other acidosis: Secondary | ICD-10-CM | POA: Diagnosis not present

## 2023-09-07 DIAGNOSIS — I10 Essential (primary) hypertension: Secondary | ICD-10-CM | POA: Diagnosis not present

## 2023-09-16 ENCOUNTER — Encounter: Payer: Self-pay | Admitting: Podiatry

## 2023-09-16 ENCOUNTER — Ambulatory Visit (INDEPENDENT_AMBULATORY_CARE_PROVIDER_SITE_OTHER): Admitting: Podiatry

## 2023-09-16 DIAGNOSIS — B351 Tinea unguium: Secondary | ICD-10-CM | POA: Diagnosis not present

## 2023-09-16 DIAGNOSIS — E1169 Type 2 diabetes mellitus with other specified complication: Secondary | ICD-10-CM | POA: Diagnosis not present

## 2023-09-16 DIAGNOSIS — E119 Type 2 diabetes mellitus without complications: Secondary | ICD-10-CM | POA: Diagnosis not present

## 2023-09-16 DIAGNOSIS — L603 Nail dystrophy: Secondary | ICD-10-CM | POA: Diagnosis not present

## 2023-09-16 DIAGNOSIS — L84 Corns and callosities: Secondary | ICD-10-CM | POA: Diagnosis not present

## 2023-09-16 NOTE — Progress Notes (Signed)
 Chief Complaint  Patient presents with   Nail Problem    "Much better"    HPI: 68 y.o. female presents today following up for PNA recheck bilateral hallux bilateral borders.  Procedure was done on 08/05/2023.  These have been slow to heal due to diabetes, history of kidney transplant.  She does also complain of some calluses to the bilateral fifth toes today and is wanting to discuss treatment options for fungal toenails.  Past Medical History:  Diagnosis Date   Anemia    Anxiety    Arthritis    Diabetes mellitus without complication (HCC)    type 2   Heart murmur    echo 07/02/20: Mild MR/TR, mild-moderate AV sclerosis, bicuspid or functional bicuspid AV, no evidence of AS. Murmr felt due to AV.   History of blood transfusion    Hyperlipidemia    Hypertension    Kidney transplant status 11/10/2022   Mixed hyperlipidemia 04/08/2019   Pneumonia    PONV (postoperative nausea and vomiting)    Renal disorder    M-W-F   Type 2 diabetes mellitus without obesity (HCC) 01/04/2018    Past Surgical History:  Procedure Laterality Date   AV FISTULA PLACEMENT Left 05/20/2020   Procedure: INSERTION OF LEFT ARM ARTERIOVENOUS (AV) FISTULA;  Surgeon: Young Hensen, MD;  Location: [facility] OR;  Service: Vascular;  Laterality: Left;   BASCILIC VEIN TRANSPOSITION Left 08/05/2020   Procedure: SECOND STAGE BASILIC VEIN TRANSPOSITION;  Surgeon: Young Hensen, MD;  [facility] OR;  Service: Vascular;  Laterality: Left;   BIOPSY  09/22/2021   Procedure: BIOPSY;  Surgeon: Asencion Blacksmith, MD;  Location: [facility] ENDOSCOPY;  Service: Gastroenterology;;   BLADDER REPAIR     nov 2023   CESAREAN SECTION     CHOLECYSTECTOMY     ESOPHAGOGASTRODUODENOSCOPY (EGD) WITH PROPOFOL  N/A 09/22/2021   Procedure: ESOPHAGOGASTRODUODENOSCOPY (EGD) WITH PROPOFOL ;  Surgeon: Asencion Blacksmith, MD;  Location: [facility] ENDOSCOPY;  Service: Gastroenterology;  Laterality: N/A;   INSERTION OF DIALYSIS CATHETER N/A  05/20/2020   Procedure: INSERTION OF DIALYSIS CATHETER;  Surgeon: Young Hensen, MD;  Location: [facility] OR;  Service: Vascular;  Laterality: N/A;   VAGINAL HYSTERECTOMY     nov 2023   VIDEO BRONCHOSCOPY Bilateral 09/26/2021   Procedure: VIDEO BRONCHOSCOPY WITHOUT FLUORO;  Surgeon: Josiah Nigh, MD;  Location: [facility] ENDOSCOPY;  Service: Pulmonary;  Laterality: Bilateral;    Allergies  Allergen Reactions   Ceftriaxone      ROS    Physical Exam: There were no vitals filed for this visit.  General: The patient is alert and oriented x3 in no acute distress.  Dermatology: PNA sites to bilateral hallux bilateral borders appear well-healed at this time. No signs of infection.  There is some yellow discoloration with mild subungual debris present to the bilateral hallux nails.  Preulcerative callus present dorsal lateral fifth toe PIPJ bilaterally x 2. Pedal skin atrophic  Vascular: Palpable pedal pulses bilaterally. Capillary refill within normal limits.  Pedal hair growth absent. No appreciable edema.  No erythema or calor.  Neurological: Light touch sensation grossly intact bilateral feet. Protective sensation decreased.  Musculoskeletal Exam: Adductovarus fifth toes present.  Muscle strength 5/5   Assessment/Plan of Care: 1. Onychomycosis of multiple toenails with type 2 diabetes mellitus (HCC)   2. Pre-ulcerative calluses   3. Type 2 diabetes mellitus without obesity (HCC)      No orders of the defined types were placed in this encounter.  None  Discussed clinical findings with patient today.  Patient qualifies for high risk foot care due to diabetes, immunosuppressive therapy secondary to kidney transplant  All symptomatic hyperkeratoses x 2 to bilateral dorsal lateral fifth toe PIPJ's were safely debrided with a sterile #15 blade to patient's level of comfort without incident. We discussed preventative and palliative care of these lesions including supportive and  accommodative shoegear, padding, prefabricated and custom molded accommodative orthoses, use of a pumice stone and lotions/creams daily.  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  Nail specimen obtained from bilateral hallux nail to be sent to pathology.  Will review the results of this in about 3 to 4 weeks.   Nahdia Doucet L. Lunda Salines, AACFAS [facility]     [address] [place], [place] [address]                Office [phone]  Fax [phone]

## 2023-09-28 ENCOUNTER — Other Ambulatory Visit: Payer: Self-pay | Admitting: Podiatry

## 2023-09-29 DIAGNOSIS — Z9181 History of falling: Secondary | ICD-10-CM | POA: Diagnosis not present

## 2023-09-29 DIAGNOSIS — H832X2 Labyrinthine dysfunction, left ear: Secondary | ICD-10-CM | POA: Diagnosis not present

## 2023-09-29 DIAGNOSIS — R42 Dizziness and giddiness: Secondary | ICD-10-CM | POA: Diagnosis not present

## 2023-10-05 DIAGNOSIS — E1121 Type 2 diabetes mellitus with diabetic nephropathy: Secondary | ICD-10-CM | POA: Diagnosis not present

## 2023-10-05 DIAGNOSIS — Z4822 Encounter for aftercare following kidney transplant: Secondary | ICD-10-CM | POA: Diagnosis not present

## 2023-10-05 DIAGNOSIS — Z940 Kidney transplant status: Secondary | ICD-10-CM | POA: Diagnosis not present

## 2023-10-05 DIAGNOSIS — K5909 Other constipation: Secondary | ICD-10-CM | POA: Diagnosis not present

## 2023-10-05 DIAGNOSIS — N390 Urinary tract infection, site not specified: Secondary | ICD-10-CM | POA: Diagnosis not present

## 2023-10-05 DIAGNOSIS — Z79621 Long term (current) use of calcineurin inhibitor: Secondary | ICD-10-CM | POA: Diagnosis not present

## 2023-10-05 DIAGNOSIS — Z5181 Encounter for therapeutic drug level monitoring: Secondary | ICD-10-CM | POA: Diagnosis not present

## 2023-10-05 DIAGNOSIS — D849 Immunodeficiency, unspecified: Secondary | ICD-10-CM | POA: Diagnosis not present

## 2023-10-08 DIAGNOSIS — Z9181 History of falling: Secondary | ICD-10-CM | POA: Diagnosis not present

## 2023-10-08 DIAGNOSIS — H832X2 Labyrinthine dysfunction, left ear: Secondary | ICD-10-CM | POA: Diagnosis not present

## 2023-10-08 DIAGNOSIS — R42 Dizziness and giddiness: Secondary | ICD-10-CM | POA: Diagnosis not present

## 2023-10-12 ENCOUNTER — Other Ambulatory Visit: Payer: Self-pay | Admitting: Nurse Practitioner

## 2023-10-12 DIAGNOSIS — R42 Dizziness and giddiness: Secondary | ICD-10-CM | POA: Diagnosis not present

## 2023-10-12 DIAGNOSIS — Z9181 History of falling: Secondary | ICD-10-CM | POA: Diagnosis not present

## 2023-10-12 DIAGNOSIS — Z1231 Encounter for screening mammogram for malignant neoplasm of breast: Secondary | ICD-10-CM

## 2023-10-12 DIAGNOSIS — H832X2 Labyrinthine dysfunction, left ear: Secondary | ICD-10-CM | POA: Diagnosis not present

## 2023-10-14 ENCOUNTER — Ambulatory Visit (INDEPENDENT_AMBULATORY_CARE_PROVIDER_SITE_OTHER): Admitting: Podiatry

## 2023-10-14 DIAGNOSIS — B351 Tinea unguium: Secondary | ICD-10-CM

## 2023-10-14 DIAGNOSIS — E119 Type 2 diabetes mellitus without complications: Secondary | ICD-10-CM | POA: Diagnosis not present

## 2023-10-14 DIAGNOSIS — E1169 Type 2 diabetes mellitus with other specified complication: Secondary | ICD-10-CM

## 2023-10-14 NOTE — Progress Notes (Signed)
 Chief Complaint  Patient presents with   Pathologhy results.     Here today for pathology results and nail care, however, her nails are very short already and she states that she cut them herself a couple weeks ago. She stated that the 2nd toe on the left foot has an ingrown on the medial nail border. Last A1c was 7.2 in Jan and no anti coag.     HPI: 68 y.o. female presents today to review nail pathology.  Does have diabetes, history of kidney transplant and is on tacrolimus  for this.She also states that left 2nd toenail medial aspect sometimes gets painful, not currently bothering her today.  Past Medical History:  Diagnosis Date   Anemia    Anxiety    Arthritis    Diabetes mellitus without complication (HCC)    type 2   Heart murmur    echo 07/02/20: Mild MR/TR, mild-moderate AV sclerosis, bicuspid or functional bicuspid AV, no evidence of AS. Murmr felt due to AV.   History of blood transfusion    Hyperlipidemia    Hypertension    Kidney transplant status 11/10/2022   Mixed hyperlipidemia 04/08/2019   Pneumonia    PONV (postoperative nausea and vomiting)    Renal disorder    M-W-F   Type 2 diabetes mellitus without obesity (HCC) 01/04/2018    Past Surgical History:  Procedure Laterality Date   AV FISTULA PLACEMENT Left 05/20/2020   Procedure: INSERTION OF LEFT ARM ARTERIOVENOUS (AV) FISTULA;  Surgeon: Young Hensen, MD;  Location: [facility] OR;  Service: Vascular;  Laterality: Left;   BASCILIC VEIN TRANSPOSITION Left 08/05/2020   Procedure: SECOND STAGE BASILIC VEIN TRANSPOSITION;  Surgeon: Young Hensen, MD;  Location: [facility] OR;  Service: Vascular;  Laterality: Left;   BIOPSY  09/22/2021   Procedure: BIOPSY;  Surgeon: Asencion Blacksmith, MD;  Location: [facility] ENDOSCOPY;  Service: Gastroenterology;;   BLADDER REPAIR     nov 2023   CESAREAN SECTION     CHOLECYSTECTOMY     ESOPHAGOGASTRODUODENOSCOPY (EGD) WITH PROPOFOL  N/A 09/22/2021   Procedure:  ESOPHAGOGASTRODUODENOSCOPY (EGD) WITH PROPOFOL ;  Surgeon: Asencion Blacksmith, MD;  Location: [facility] ENDOSCOPY;  Service: Gastroenterology;  Laterality: N/A;   INSERTION OF DIALYSIS CATHETER N/A 05/20/2020   Procedure: INSERTION OF DIALYSIS CATHETER;  Surgeon: Young Hensen, MD;  Location: [facility] OR;  Service: Vascular;  Laterality: N/A;   VAGINAL HYSTERECTOMY     nov 2023   VIDEO BRONCHOSCOPY Bilateral 09/26/2021   Procedure: VIDEO BRONCHOSCOPY WITHOUT FLUORO;  Surgeon: Josiah Nigh, MD;  Location: [facility] ENDOSCOPY;  Service: Pulmonary;  Laterality: Bilateral;    Allergies  Allergen Reactions   Ceftriaxone      ROS    Physical Exam: There were no vitals filed for this visit.  General: The patient is alert and oriented x3 in no acute distress.  Dermatology: Pedal skin is well-hydrated, somewhat atrophic.  There is some yellow discoloration with mild subungual debris present to the bilateral hallux nails.  Vascular: Palpable pedal pulses bilaterally. Capillary refill within normal limits.  Pedal hair growth absent. No appreciable edema.  No erythema or calor.  Neurological: Light touch sensation grossly intact bilateral feet. Protective sensation decreased.  Musculoskeletal Exam: Adductovarus fifth toes present.  Muscle strength 5/5   Assessment/Plan of Care: 1. Onychomycosis of multiple toenails with type 2 diabetes mellitus (HCC)   2. Type 2 diabetes mellitus without obesity (HCC)      No orders of the defined  types were placed in this encounter.  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 - Some concern for long-term antifungal medication due to tacrolimus  use. She will ask her managing provider about this. - Obtain CBC and hepatic function panel in the interim - Tentative plan for patient follow-up in 2 months.  Dimarco Minkin L. Lunda Salines, AACFAS [facility]     [address] [place], [place] [address]                Office [phone]  Fax [phone]

## 2023-10-16 ENCOUNTER — Encounter: Payer: Self-pay | Admitting: Podiatry

## 2023-11-02 DIAGNOSIS — E1169 Type 2 diabetes mellitus with other specified complication: Secondary | ICD-10-CM | POA: Diagnosis not present

## 2023-11-02 DIAGNOSIS — Z940 Kidney transplant status: Secondary | ICD-10-CM | POA: Diagnosis not present

## 2023-11-02 DIAGNOSIS — J069 Acute upper respiratory infection, unspecified: Secondary | ICD-10-CM | POA: Diagnosis not present

## 2023-11-02 DIAGNOSIS — Z4822 Encounter for aftercare following kidney transplant: Secondary | ICD-10-CM | POA: Diagnosis not present

## 2023-11-02 DIAGNOSIS — Z13220 Encounter for screening for lipoid disorders: Secondary | ICD-10-CM | POA: Diagnosis not present

## 2023-11-02 DIAGNOSIS — D849 Immunodeficiency, unspecified: Secondary | ICD-10-CM | POA: Diagnosis not present

## 2023-11-02 DIAGNOSIS — Z5181 Encounter for therapeutic drug level monitoring: Secondary | ICD-10-CM | POA: Diagnosis not present

## 2023-11-02 DIAGNOSIS — Z79621 Long term (current) use of calcineurin inhibitor: Secondary | ICD-10-CM | POA: Diagnosis not present

## 2023-11-02 DIAGNOSIS — K5909 Other constipation: Secondary | ICD-10-CM | POA: Diagnosis not present

## 2023-11-02 DIAGNOSIS — N390 Urinary tract infection, site not specified: Secondary | ICD-10-CM | POA: Diagnosis not present

## 2023-11-04 DIAGNOSIS — Z9181 History of falling: Secondary | ICD-10-CM | POA: Diagnosis not present

## 2023-11-04 DIAGNOSIS — R42 Dizziness and giddiness: Secondary | ICD-10-CM | POA: Diagnosis not present

## 2023-11-04 DIAGNOSIS — H832X2 Labyrinthine dysfunction, left ear: Secondary | ICD-10-CM | POA: Diagnosis not present

## 2023-11-09 DIAGNOSIS — N390 Urinary tract infection, site not specified: Secondary | ICD-10-CM | POA: Diagnosis not present

## 2023-11-09 DIAGNOSIS — Z940 Kidney transplant status: Secondary | ICD-10-CM | POA: Diagnosis not present

## 2023-11-09 DIAGNOSIS — D849 Immunodeficiency, unspecified: Secondary | ICD-10-CM | POA: Diagnosis not present

## 2023-11-09 DIAGNOSIS — R059 Cough, unspecified: Secondary | ICD-10-CM | POA: Diagnosis not present

## 2023-11-09 DIAGNOSIS — B389 Coccidioidomycosis, unspecified: Secondary | ICD-10-CM | POA: Diagnosis not present

## 2023-11-10 DIAGNOSIS — E113591 Type 2 diabetes mellitus with proliferative diabetic retinopathy without macular edema, right eye: Secondary | ICD-10-CM | POA: Diagnosis not present

## 2023-11-16 DIAGNOSIS — Z1231 Encounter for screening mammogram for malignant neoplasm of breast: Secondary | ICD-10-CM | POA: Diagnosis not present

## 2023-11-16 DIAGNOSIS — R92323 Mammographic fibroglandular density, bilateral breasts: Secondary | ICD-10-CM | POA: Diagnosis not present

## 2023-11-30 DIAGNOSIS — Z5181 Encounter for therapeutic drug level monitoring: Secondary | ICD-10-CM | POA: Diagnosis not present

## 2023-11-30 DIAGNOSIS — E1129 Type 2 diabetes mellitus with other diabetic kidney complication: Secondary | ICD-10-CM | POA: Diagnosis not present

## 2023-11-30 DIAGNOSIS — D849 Immunodeficiency, unspecified: Secondary | ICD-10-CM | POA: Diagnosis not present

## 2023-11-30 DIAGNOSIS — M25562 Pain in left knee: Secondary | ICD-10-CM | POA: Diagnosis not present

## 2023-11-30 DIAGNOSIS — K5909 Other constipation: Secondary | ICD-10-CM | POA: Diagnosis not present

## 2023-11-30 DIAGNOSIS — I10 Essential (primary) hypertension: Secondary | ICD-10-CM | POA: Diagnosis not present

## 2023-11-30 DIAGNOSIS — E119 Type 2 diabetes mellitus without complications: Secondary | ICD-10-CM | POA: Diagnosis not present

## 2023-11-30 DIAGNOSIS — Z940 Kidney transplant status: Secondary | ICD-10-CM | POA: Diagnosis not present

## 2023-11-30 DIAGNOSIS — Z4822 Encounter for aftercare following kidney transplant: Secondary | ICD-10-CM | POA: Diagnosis not present

## 2023-11-30 DIAGNOSIS — Z79621 Long term (current) use of calcineurin inhibitor: Secondary | ICD-10-CM | POA: Diagnosis not present

## 2023-11-30 DIAGNOSIS — E785 Hyperlipidemia, unspecified: Secondary | ICD-10-CM | POA: Diagnosis not present

## 2023-12-16 ENCOUNTER — Ambulatory Visit: Admitting: Podiatry

## 2023-12-30 ENCOUNTER — Ambulatory Visit: Admitting: Podiatry

## 2024-01-20 ENCOUNTER — Ambulatory Visit (INDEPENDENT_AMBULATORY_CARE_PROVIDER_SITE_OTHER): Admitting: Podiatry

## 2024-01-20 ENCOUNTER — Encounter: Payer: Self-pay | Admitting: Podiatry

## 2024-01-20 DIAGNOSIS — E1140 Type 2 diabetes mellitus with diabetic neuropathy, unspecified: Secondary | ICD-10-CM | POA: Diagnosis not present

## 2024-01-20 DIAGNOSIS — M79675 Pain in left toe(s): Secondary | ICD-10-CM | POA: Diagnosis not present

## 2024-01-20 DIAGNOSIS — B351 Tinea unguium: Secondary | ICD-10-CM

## 2024-01-20 DIAGNOSIS — M79674 Pain in right toe(s): Secondary | ICD-10-CM

## 2024-01-20 DIAGNOSIS — L84 Corns and callosities: Secondary | ICD-10-CM | POA: Diagnosis not present

## 2024-01-20 DIAGNOSIS — Z940 Kidney transplant status: Secondary | ICD-10-CM | POA: Diagnosis not present

## 2024-01-20 DIAGNOSIS — Z79899 Other long term (current) drug therapy: Secondary | ICD-10-CM | POA: Diagnosis not present

## 2024-01-20 NOTE — Progress Notes (Signed)
  Subjective:  Patient ID: Rachael Burke, female    DOB: [birth year],  MRN: [id number]  Chief Complaint  Patient presents with   Nail Problem    Patient is here for routine [facility] and small ingrown on left medial border    68 y.o. female presents with the above complaint. History confirmed with patient. Patient presenting with pain related to dystrophic thickened elongated nails. Patient is unable to trim own nails related to nail dystrophy and mobility issues. Patient does have a history of T2DM. Patient does/does not have callus present located at the fifth toe dorsal lateral PIPJ's causing pain.  Also reports some persistent tenderness at left first toe medial border at site of prior PNA.  Objective:  Physical Exam: warm, good capillary refill, pedal skin atrophic, decreased pedal hair growth nail exam onychomycosis of the toenails, onycholysis, dystrophic nails, and some tenderness on palpation of left first toe base of the nail plate proximal medial aspect without signs of infection or inflammation. DP pulses palpable, PT pulses palpable, and protective sensation decreased Left Foot:  Pain with palpation of nails due to elongation and dystrophic growth.  Hyperkeratotic corn present dorsal lateral PIPJ fifth toe.  Some mild tenderness on palpation of the left third toe medial border with mild incurvation. Right Foot: Pain with palpation of nails due to elongation and dystrophic growth. Hyperkeratotic corn present dorsal lateral PIPJ fifth toe.  Assessment:   1. Pre-ulcerative calluses   2. Controlled type 2 diabetes mellitus with neuropathy (HCC)   3. Pain due to onychomycosis of toenails of both feet      Plan:  Patient was evaluated and treated and all questions answered.  #Hyperkeratotic lesions/pre ulcerative calluses present dorsal lateral fifth toe PIPJ All symptomatic hyperkeratoses x 2 separate lesions were safely debrided with a sterile #312 blade to patient's level  of comfort without incident. We discussed preventative and palliative care of these lesions including supportive and accommodative shoegear, padding, prefabricated and custom molded accommodative orthoses, use of a pumice stone and lotions/creams daily. - High risk footcare due to diabetes with neuropathy, trophic changes, and immunosuppressive state  #Onychomycosis with pain  -Nails palliatively debrided as below. -Educated on self-care - Monitor left first toe for signs of worsening, did discuss home supportive care  Procedure: Nail Debridement Rationale: Pain Type of Debridement: manual, sharp debridement. Instrumentation: Nail nipper, rotary burr. Number of Nails: 10  Patient educated on diabetes. Discussed proper diabetic foot care and discussed risks and complications of disease. Educated patient in depth on reasons to return to the office immediately should he/she discover anything concerning or new on the feet. All questions answered. Discussed proper shoes as well.    Return in about 3 months (around 04/21/2024) for Diabetic Foot Care.         Ethan Saddler, DPM [facility] / [facility]

## 2024-01-25 DIAGNOSIS — E113591 Type 2 diabetes mellitus with proliferative diabetic retinopathy without macular edema, right eye: Secondary | ICD-10-CM | POA: Diagnosis not present

## 2024-02-15 DIAGNOSIS — Z48298 Encounter for aftercare following other organ transplant: Secondary | ICD-10-CM | POA: Diagnosis not present

## 2024-02-15 DIAGNOSIS — Z79899 Other long term (current) drug therapy: Secondary | ICD-10-CM | POA: Diagnosis not present

## 2024-02-15 DIAGNOSIS — D849 Immunodeficiency, unspecified: Secondary | ICD-10-CM | POA: Diagnosis not present

## 2024-02-15 DIAGNOSIS — Z940 Kidney transplant status: Secondary | ICD-10-CM | POA: Diagnosis not present

## 2024-02-15 DIAGNOSIS — B389 Coccidioidomycosis, unspecified: Secondary | ICD-10-CM | POA: Diagnosis not present

## 2024-02-15 DIAGNOSIS — N390 Urinary tract infection, site not specified: Secondary | ICD-10-CM | POA: Diagnosis not present

## 2024-02-15 DIAGNOSIS — Z23 Encounter for immunization: Secondary | ICD-10-CM | POA: Diagnosis not present

## 2024-02-18 IMAGING — DX DG CHEST 2V
2 series · 2 of 2 positions shown · non-contrast
Comparison: 09/21/2021

CLINICAL DATA: Hemoptysis

EXAM:
CHEST - 2 VIEW

[chest pa]
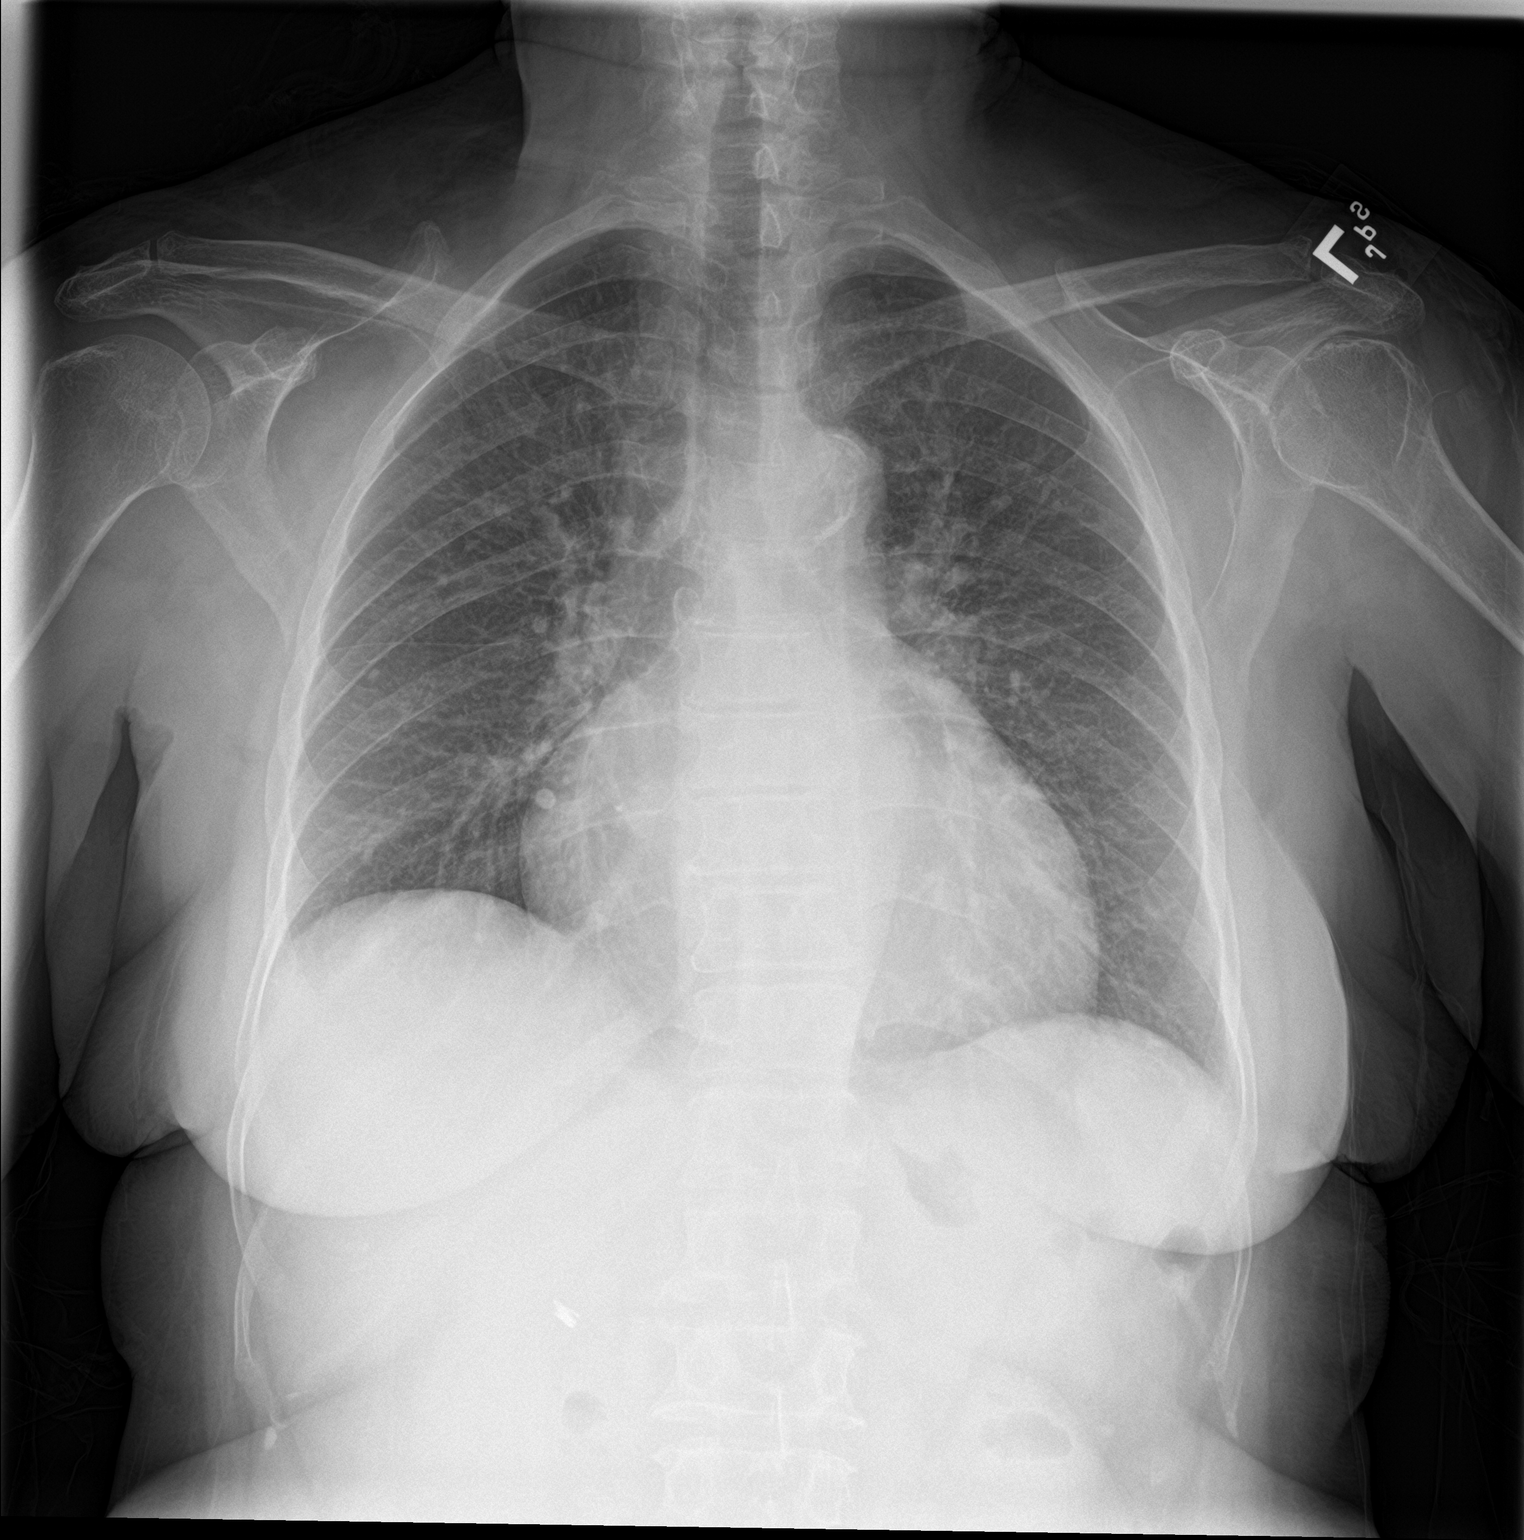

[chest lat]
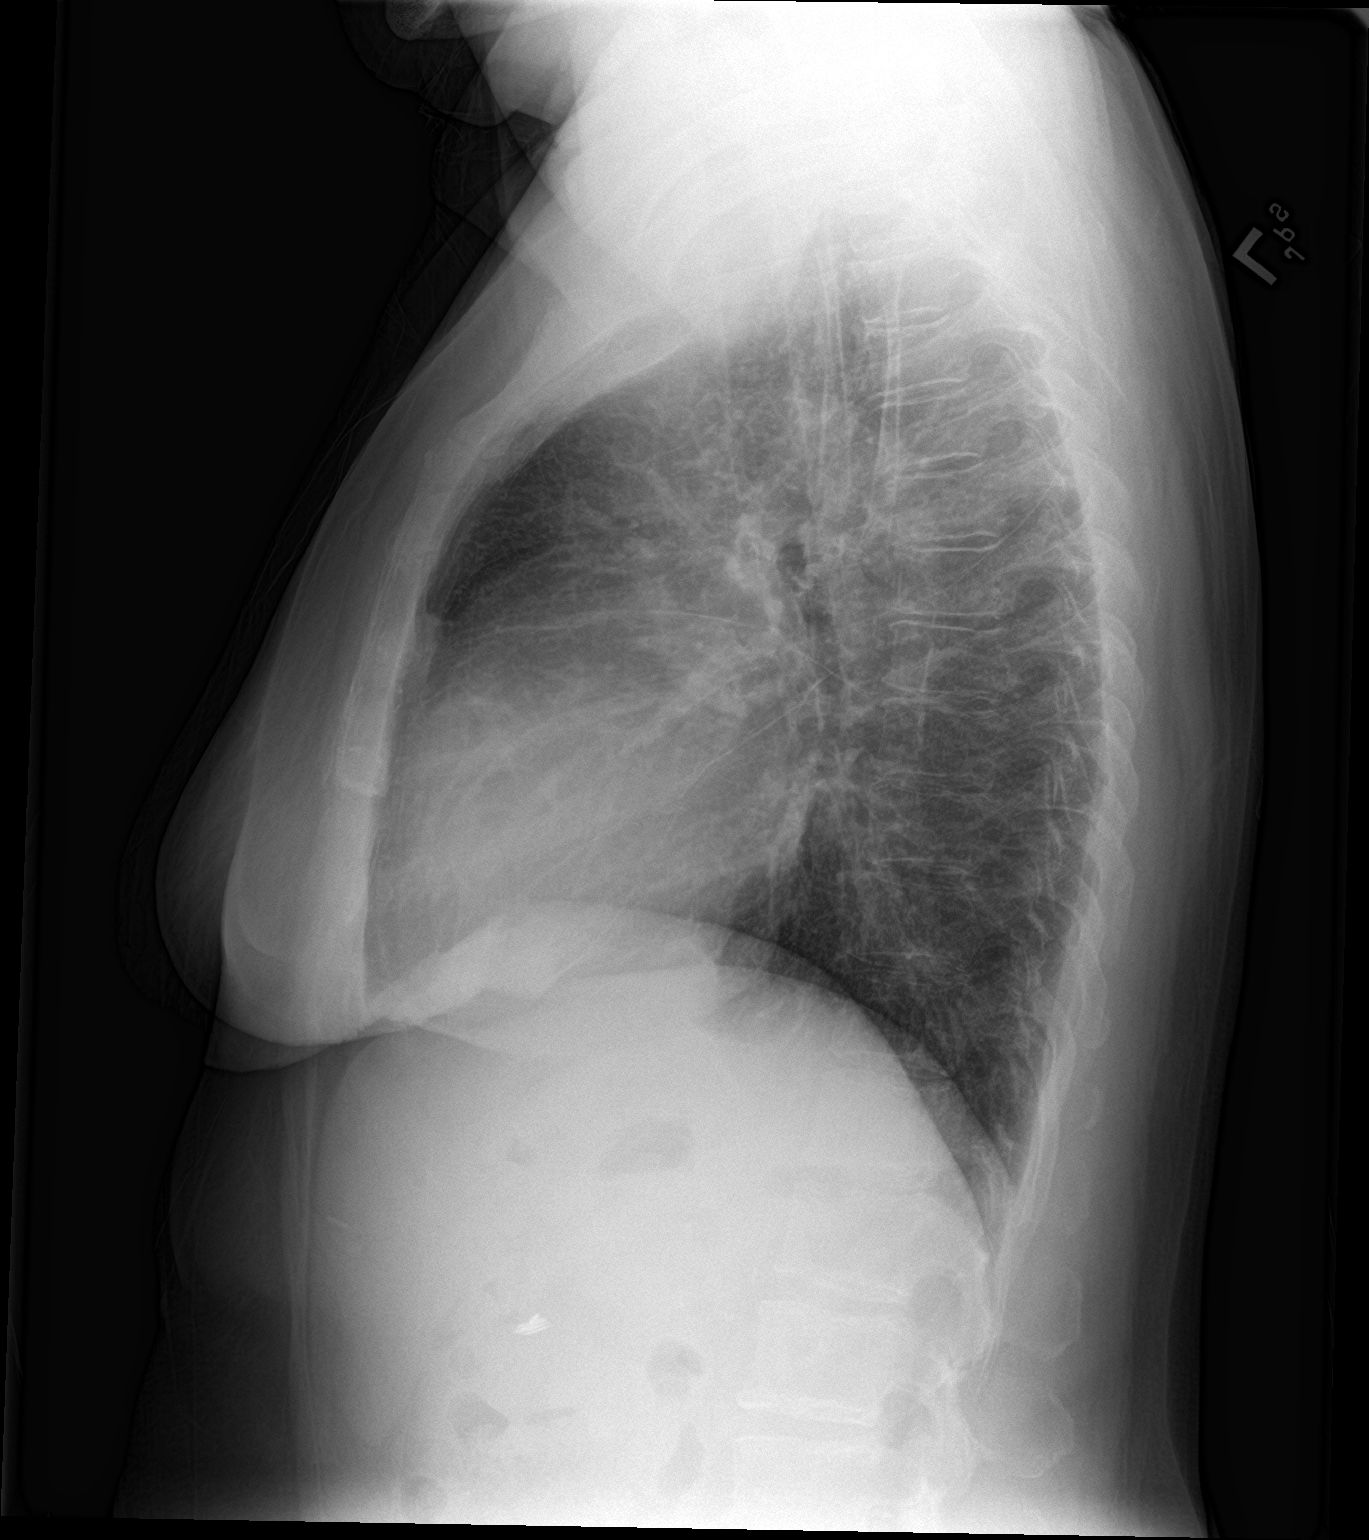

[2 of 2 positions shown; findings below may reference images not displayed]

FINDINGS: Similar cardiomegaly with central vascular congestion. No current
significant edema pattern or CHF. No focal pneumonia, collapse or
consolidation. Negative for effusion or pneumothorax. Trachea
midline. Aorta atherosclerotic. Remote cholecystectomy and mild
degenerative changes of the spine noted.
IMPRESSION: Cardiomegaly with vascular congestion

Aortic Atherosclerosis (25YOC-VMP.P).

## 2024-02-25 DIAGNOSIS — Z940 Kidney transplant status: Secondary | ICD-10-CM | POA: Diagnosis not present

## 2024-03-07 ENCOUNTER — Other Ambulatory Visit: Payer: Self-pay | Admitting: Nurse Practitioner

## 2024-03-07 DIAGNOSIS — E785 Hyperlipidemia, unspecified: Secondary | ICD-10-CM

## 2024-04-20 ENCOUNTER — Ambulatory Visit (INDEPENDENT_AMBULATORY_CARE_PROVIDER_SITE_OTHER): Admitting: Podiatry

## 2024-04-20 ENCOUNTER — Encounter: Payer: Self-pay | Admitting: Podiatry

## 2024-04-20 DIAGNOSIS — M79674 Pain in right toe(s): Secondary | ICD-10-CM | POA: Diagnosis not present

## 2024-04-20 DIAGNOSIS — E1140 Type 2 diabetes mellitus with diabetic neuropathy, unspecified: Secondary | ICD-10-CM

## 2024-04-20 DIAGNOSIS — M79675 Pain in left toe(s): Secondary | ICD-10-CM | POA: Diagnosis not present

## 2024-04-20 DIAGNOSIS — L84 Corns and callosities: Secondary | ICD-10-CM

## 2024-04-20 DIAGNOSIS — B351 Tinea unguium: Secondary | ICD-10-CM | POA: Diagnosis not present

## 2024-04-20 NOTE — Progress Notes (Signed)
  Subjective:  Patient ID: Rachael Burke, female    DOB: 26-Mar-1956,  MRN: [id number]  Chief Complaint  Patient presents with   [facility]    [facility]. No calluses   A1c 5.0. no anti coag.    68 y.o. female presents with the above complaint. History confirmed with patient. Patient presenting with pain related to dystrophic thickened elongated nails. Patient is unable to trim own nails related to nail dystrophy and mobility issues. Patient does have a history of T2DM. Patient does have callus present located at the fifth toe dorsal lateral PIPJ's causing pain.  Also reports some persistent tenderness at left first toe medial border at site of prior PNA, no evidence of regrowth seen.  Objective:  Physical Exam: warm, good capillary refill, pedal skin atrophic, decreased pedal hair growth nail exam onychomycosis of the toenails, onycholysis, dystrophic nails greater than 3 mm thickening, and some tenderness on palpation of left first toe base of the nail plate proximal medial aspect without signs of infection or inflammation.  No evidence of nail regrowth at the previous P&A sites. DP pulses palpable, PT pulses palpable, and protective sensation decreased Left Foot:  Pain with palpation of nails due to elongation and dystrophic growth.  Hyperkeratotic corn present dorsal lateral PIPJ fifth toe.  Some mild tenderness on palpation of the left second toe medial border with mild incurvation. Right Foot: Pain with palpation of nails due to elongation and dystrophic growth. Hyperkeratotic corn present dorsal lateral PIPJ fifth toe.  Assessment:   1. Controlled type 2 diabetes mellitus with neuropathy (HCC)   2. Pain due to onychomycosis of toenails of both feet   3. Pre-ulcerative calluses      Plan:  Patient was evaluated and treated and all questions answered.  #Hyperkeratotic lesions/pre ulcerative calluses present dorsal lateral fifth toe PIPJ All symptomatic hyperkeratoses x2 were safely  debrided with a sterile #15 blade to patient's level of comfort without incident. We discussed preventative and palliative care of these lesions including supportive and accommodative shoegear, padding, prefabricated and custom molded accommodative orthoses, use of a pumice stone and lotions/creams daily. - High risk footcare due to diabetes with neuropathy, trophic changes, and immunosuppressive state  #Onychomycosis with pain  -Nails palliatively debrided as below. -Educated on self-care  Procedure: Nail Debridement Rationale: Pain Type of Debridement: manual, sharp debridement. Instrumentation: Nail nipper, rotary burr. Number of Nails: 10   Patient educated on diabetes. Discussed proper diabetic foot care and discussed risks and complications of disease. Educated patient in depth on reasons to return to the office immediately should he/she discover anything concerning or new on the feet. All questions answered. Discussed proper shoes as well.     Return in about 3 months (around 07/21/2024) for Diabetic Foot Care.         Ethan Saddler, DPM [facility] / [facility]

## 2024-06-21 LAB — OPHTHALMOLOGY REPORT-SCANNED

## 2024-06-21 NOTE — Progress Notes (Signed)
 Rachael Burke is a 69 y.o.  with  reports that she has never smoked. She has never been exposed to tobacco smoke. She has never used smokeless tobacco. 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   Nausea and vomiting 11/25/2022   Constipation 01/19/2023   Chronic gastritis without bleeding 01/26/2023   Essential hypertension 04/06/2023   Nonintractable headache 06/08/2023   Cervicalgia 06/08/2023   Vestibular hypofunction, 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   At risk for falls 08/27/2023   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/27/2023   Recurrent UTI (urinary tract infection) 10/05/2023   Resolved Ambulatory Problems    Diagnosis Date Noted   ESRD (end stage renal disease) (HCC) 05/01/2021   Pre-transplant evaluation for CKD (chronic kidney disease) 05/01/2021   Anemia due to chronic kidney disease, on chronic dialysis (HCC) 05/01/2021   AKI (acute kidney injury) 01/12/2023   Chronic idiopathic constipation 01/13/2023   No Additional Past Medical History   who presents today at Urgent Care for  Chief Complaint  Patient presents with   Wheezing   Cough   Sore Throat   Generalized Body Aches    Pt states symptoms started on Saturday, pt states that she has taken tylenol , and robitussin for treatment but nothing today.        History of Present Illness The patient is a 69 year old female who presents with cough, wheezing, sore throat, and body aches. She  states it all started on Sunday. She has been taking Tylenol  and Robitussin but nothing today.  The patient reports that her symptoms began on Sunday. She has been managing her symptoms with Tylenol  and Robitussin but has not taken any medication today. She has a known allergy to CEFTRIAXONE  but tolerates amoxicillin well. She is a kidney transplant recipient and is inquiring about the safety of steroid use in her case.      Patient has no co-morbidities  or medications that might increase the risk for developing a severe infection     reports that she has never smoked. She has never been exposed to tobacco smoke. She has never used smokeless tobacco.  Review of Systems  Review of systems is otherwise negative except as noted in the HPI and Assessment/MDM   Physical Exam  BP (!) 138/57   Pulse 61   Temp 97.7 F (36.5 C) (Tympanic)   Resp 16   Ht 1.575 m (5' 2)   Wt 72.6 kg (160 lb)   SpO2 100%   BMI 29.26 kg/m   Constitutional:      General: Patient is not in acute distress.    Appearance: Normal appearance.  Neurological:     General: No focal deficit present.     Mental Status: alert and oriented to person, place, and time.  HENT:     Eyes:     Conjunctiva/sclera: Conjunctivae normal. Pharynx normal    There is no associated anterior cervical lymphadenopathy    Pupils: Pupils are equal, round, and reactive to light.     TM's normal  with no visible effusion  Cardiovascular:     Heart sounds: Normal heart sounds. No murmur heard.    No Lower extremity edema noted Pulmonary:     Effort: Pulmonary effort is normal. No respiratory distress.     Breath sounds: No wheezing, rhonchi or rales.  Abdominal:     General: Bowel sounds are normal. There is no distension.     Tenderness: There is no abdominal tenderness.  Musculoskeletal:        General: Normal range of motion.     Neck:  No rigidity.  Skin:    General: Skin is warm and dry.  Psychiatric:        Mood  and Affect: Mood normal.   No orders to display            DIAGNOSIS/PLAN     1. Acute non-recurrent frontal sinusitis  amoxicillin (AMOXIL) 500 mg capsule   methylPREDNISolone (MEDROL DOSEPAK) 4 mg 6 day dose pack    2. Body aches  POC Influenza A&B NAT (IDNOW)   POC SARS-COV-2 SYMPTOMATIC (IDNOW)    3. Sore throat  POC Influenza A&B NAT (IDNOW)   POC SARS-COV-2 SYMPTOMATIC (IDNOW)    4. Acute cough  POC Influenza A&B NAT (IDNOW)   POC SARS-COV-2 SYMPTOMATIC (IDNOW)      Assessment & Plan Initial Assessment: 69 year old female with cough, wheezing, sore throat, and body aches since Sunday. Negative flu and COVID tests.  Differential Diagnosis: - Influenza: Negative test results. - COVID-19: Negative test results. - Sinus infection: Drainage causing cough and wheezing. Prescribed amoxicillin and steroid.  ED Course: - Flu and COVID tests performed, both negative. - Prescription for amoxicillin sent to pharmacy. - Steroid prescribed.  Final Assessment: Negative flu and COVID tests. Likely sinus infection causing symptoms. Prescribed amoxicillin and steroid.  Clinical Impression: - Sinus infection  Disposition: - Discharge: Patient discharged with prescriptions.  Follow-Up: Follow up with primary care physician if symptoms persist.  Patient Education: Discussed the use of amoxicillin and steroid for symptom relief.  Portions of this note were created using the aid of voice recognition Dragon/DAX dictation software.   We discussed risks and side effects of medications, and also discussed red flags which would warrant immediate follow-up.   Urgent Care Disposition:  Home Care

## 2024-06-30 NOTE — Progress Notes (Signed)
 "   Medicare AWV  Rachael Burke is a 69 y.o. female who presents for her subsequent annual wellness visit for Medicare.  Clinical documentation was reviewed and is accessible via encounter-level attachments.    Any physical exam components or additional concerns beyond the scope of the Annual Wellness Visit may be documented in a separate note within this encounter.  Medicare Required Components     Reviewed and updated this visit by provider: Tobacco  Allergies  Meds  Problems  Med Hx  Surg Hx  Fam Hx       Medicare required assessment: Future risk of substance use disorder / overdose risk of 5% is typical (1.3-20%).    Patient Care Team: Christopher Bohr, NP as PCP - General (Family Medicine) Fonda Burke Rear, DO as Consulting Physician (Gynecologic Oncology) Onetha JINNY Larger, MD as Consulting Physician (Gastroenterology)  Vitals   Vitals:   06/28/24 1052  BP: 138/74  Pulse: 72  Temp: 97 F (36.1 C)  TempSrc: Temporal  Resp: 17  Height: 5' 2 (1.575 m)  Weight: 156 lb (70.8 kg)  SpO2: 98%  BMI (Calculated): 28.5    Disposition   1. Medicare annual wellness visit, subsequent (Primary)    Follow up in about 1 year (around 06/28/2025)   Health maintenance issues were discussed with the patient.  A written plan was provided to the patient in the form of patient instructions in the After Visit Summary document.   -------------------------------------------------------------------------------------  Patient ID:  Rachael Burke Rachael Burke Rachael Burke is a 69 y.o. (DOB 09-01-1955) female.   Subjective      Patient presents with   Diabetes      History of Present Illness Ms. Rachael Burke Arriola patient presents for evaluation of elevated blood sugar, respiratory symptoms, gastrointestinal issues, and retinopathy. She is accompanied by her daughter.  She has been experiencing elevated blood sugar levels, which have been ongoing since her recent trip to [place]  with her father. During this trip, she was exposed to a significant amount of air, leading to a subsequent illness. She was evaluated at an urgent care facility last week, where she tested negative for influenza and COVID-19, but was informed it might be a viral infection. She was prescribed amoxicillin and a steroid to aid in breathing and alleviate wheezing, which have proven beneficial. Currently, she is dealing with a persistent cough but reports no phlegm production.  She has a long-standing history of gastrointestinal issues, which were previously managed by a gastroenterologist. However, following a kidney transplant, she experienced severe constipation, necessitating hospitalization for bowel cleansing and an emergency colonoscopy. Post-procedure, she was informed that her condition had already been addressed. Currently, she is not experiencing constipation but is dealing with frequent diarrhea. She occasionally lacks the sensation to defecate and uses medication to stimulate bowel movements. Her last consultation with a gastroenterologist was in 01/2023 or 03/2023.  She had an eye examination at [facility] on 06/21/2024, where she underwent glaucoma screening. The results were forwarded to her previous primary care provider. She did not receive any eye drops during this visit.  PAST SURGICAL HISTORY: Kidney transplant      Reviewed and updated this visit by provider: Tobacco  Allergies  Meds  Problems  Med Hx  Surg Hx  Fam Hx        Review of Systems  Negative except as noted in History of Present Illness.  Objective   BP 138/74   Pulse 72  Temp 97 F (36.1 C) (Temporal)   Resp 17   Ht 5' 2 (1.575 m)   Wt 156 lb (70.8 kg)   SpO2 98%   BMI 28.53 kg/m  Physical Exam    Physical Exam Constitutional:      Appearance: Normal appearance.  Eyes:     Extraocular Movements: Extraocular movements intact.     Pupils: Pupils are equal, round, and reactive to  light.  Cardiovascular:     Rate and Rhythm: Normal rate and regular rhythm.     Pulses: Normal pulses.     Heart sounds: Normal heart sounds.  Pulmonary:     Effort: Pulmonary effort is normal.     Breath sounds: Normal breath sounds.  Neurological:     Mental Status: She is oriented to person, place, and time.  Psychiatric:        Mood and Affect: Mood normal.        Behavior: Behavior normal.      Assessment/Plan   Assessment & Plan    Assessment & Plan Respiratory tract congestion with cough - She continues to experience a cough without phlegm. - Her respiratory symptoms have improved with amoxicillin and a steroid, which helped her breathing and wheezing. Type 2 diabetes mellitus with stage 3b chronic kidney disease, with long-term current use of insulin  (*)  Orders:   POCT A1C - Her blood sugar levels have been elevated recently. - An A1c test will be conducted today to assess her current glycemic control. Generalized abdominal discomfort  Orders:   Ambulatory referral to Gastroenterology; Future - She has a history of gastrointestinal problems, including constipation and diarrhea. - She is currently experiencing diarrhea and uses medication to manage her bowel movements. - A referral to a gastroenterologist will be made for further evaluation and management. Hx of constipation  Orders:   Ambulatory referral to Gastroenterology; Future - A referral to a gastroenterologist will be made for further evaluation and management    Risks, benefits, and alternatives of the medications and treatment plan prescribed today were discussed, and patient expressed understanding. Follow up in about 3 months (around 09/26/2024) for DM.   Christopher Bohr, NP 06/30/2024, 9:39 AM    Orders Placed This Encounter  Procedures   Ambulatory referral to Gastroenterology   POCT A1C   Patient's Medications  New Prescriptions   No medications on file  Previous Medications    ACETAMINOPHEN  (TYLENOL ) 325 MG TABLET    Take two tablets (650 mg dose) by mouth every 6 (Burke) hours as needed.   ACETAMINOPHEN  (TYLENOL ) 500 MG TABLET    Take one tablet (500 mg dose) by mouth every 6 (Burke) hours as needed for Pain.   AMLODIPINE  BESYLATE (NORVASC ) 10 MG TABLET    Take one tablet (10 mg dose) by mouth after lunch.   ATORVASTATIN  (LIPITOR) 20 MG TABLET    Take one tablet (20 mg dose) by mouth daily.   BIOTIN 1000 MCG CHEW    Chew 1 tablet by mouth daily.   CALCIUM  ACETATE (PHOSLO) 667 MG CAPSULE    Take one capsule (667 mg dose) by mouth 3 (three) times a day.   CARVEDILOL  (COREG ) 25 MG TABLET    Take one tablet (25 mg dose) by mouth 2 (two) times daily.   CYCLOBENZAPRINE (FLEXERIL) 5 MG TABLET    Take one tablet (5 mg dose) by mouth 2 (two) times a day as needed.   DULAGLUTIDE (TRULICITY) 0.75 MG/0.5 ML SOAJ INJECTION  Inject 0.5 mLs (0.75 mg dose) into the skin once a week at 0900.   ESCITALOPRAM  OXALATE (LEXAPRO ) 10 MG TABLET    Take one tablet (10 mg dose) by mouth daily as needed.   FUROSEMIDE  (LASIX ) 80 MG TABLET    Take one tablet (80 mg dose) by mouth daily as needed.   GABAPENTIN  (NEURONTIN ) 100 MG CAPSULE    Take two capsules (200 mg dose) by mouth at bedtime.   GABAPENTIN  (NEURONTIN ) 300 MG CAPSULE    Take one capsule (300 mg dose) by mouth at bedtime.   HYDRALAZINE  HCL (APRESOLINE ) 25 MG TABLET    Take one tablet (25 mg dose) by mouth 3 (three) times a day.   ISOSORBIDE -HYDRALAZINE  (BIDIL ) 20-37.5 MG PER TABLET    Take two tablets by mouth 3 (three) times a day.   LACTOBACILLUS (PROBIOTIC ACIDOPHILUS) CAPS    Take 1 capsule by mouth every evening.   LIDOCAINE -PRILOCAINE  (EMLA ) CREAM    Apply one Application topically as needed.   LINAGLIPTIN  (TRADJENTA ) 5 MG TABLET    Take one tablet (5 mg dose) by mouth every morning.   MAGNESIUM  CITRATE POWD    1 each by Other route daily.   MECLIZINE  HCL (ANTIVERT ) 12.5 MG TABLET    Take one tablet (12.5 mg dose) by mouth 2 (two)  times a day as needed.   METAMUCIL FIBER 51.7 % PACK    Take 1 packet by mouth 2 (two) times daily.   MYCOPHENOLIC ACID (MYFORTIC) 180 MG EC TABLET    Take one tablet (180 mg dose) by mouth 2 (two) times daily.   OMEPRAZOLE (PRILOSEC OTC) 20 MG TABLET    Take one tablet (20 mg dose) by mouth daily as needed.   ONDANSETRON  (ZOFRAN ) 4 MG TABLET    Take one tablet (4 mg dose) by mouth every 8 (eight) hours as needed for Nausea.   PRENAT MV-MIN W/FE-FOLATE-DHA (PRENATAL COMPLETE PO)    Take 1 tablet by mouth every evening.   SODIUM BICARBONATE  650 MG TABLET    Take one tablet (650 mg dose) by mouth 2 (two) times daily.   SODIUM ZIRCONIUM CYCLOSILICATE  (LOKELMA ) 10 G PACK PACKET    Take ten g by mouth daily.   TACROLIMUS  (PROGRAF ) 1 MG CAPSULE    Take one capsule (1 mg dose) by mouth 2 (two) times daily.   UNABLE TO FIND    NOOTROPIL (PIRACETAM) 800MG  (FROM [place])--- 1 TABLET (800MG ) AT BEDTIME AS NEEDED FOR VERTIGO   ZINC SULFATE (ZINCATE) 220 (50 ZN) MG CAPSULE    Take one capsule (220 mg dose) by mouth daily.  Modified Medications   No medications on file  Discontinued Medications   No medications on file     Computer technology was used to create visit note. Consent from the patient/caregiver was obtained prior to its use.   "

## 2024-07-03 ENCOUNTER — Emergency Department (HOSPITAL_COMMUNITY)

## 2024-07-03 ENCOUNTER — Emergency Department (HOSPITAL_COMMUNITY)
Admission: EM | Admit: 2024-07-03 | Discharge: 2024-07-04 | Disposition: A | Source: Home / Self Care | Attending: Emergency Medicine | Admitting: Emergency Medicine

## 2024-07-03 DIAGNOSIS — N179 Acute kidney failure, unspecified: Secondary | ICD-10-CM

## 2024-07-03 DIAGNOSIS — A419 Sepsis, unspecified organism: Secondary | ICD-10-CM

## 2024-07-03 DIAGNOSIS — Z7682 Awaiting organ transplant status: Secondary | ICD-10-CM

## 2024-07-03 LAB — URINE DRUG SCREEN
Amphetamines: NEGATIVE
Barbiturates: NEGATIVE
Benzodiazepines: NEGATIVE
Cocaine: NEGATIVE
Fentanyl: NEGATIVE
Methadone Scn, Ur: NEGATIVE
Opiates: NEGATIVE
Tetrahydrocannabinol: NEGATIVE

## 2024-07-03 LAB — MAGNESIUM: Magnesium: 2 mg/dL (ref 1.7–2.4)

## 2024-07-03 LAB — COMPREHENSIVE METABOLIC PANEL WITH GFR
ALT: 18 U/L (ref 0–44)
AST: 17 U/L (ref 15–41)
Albumin: 3.4 g/dL — ABNORMAL LOW (ref 3.5–5.0)
Alkaline Phosphatase: 104 U/L (ref 38–126)
Anion gap: 11 (ref 5–15)
BUN: 45 mg/dL — ABNORMAL HIGH (ref 8–23)
CO2: 25 mmol/L (ref 22–32)
Calcium: 8.7 mg/dL — ABNORMAL LOW (ref 8.9–10.3)
Chloride: 94 mmol/L — ABNORMAL LOW (ref 98–111)
Creatinine, Ser: 2.63 mg/dL — ABNORMAL HIGH (ref 0.44–1.00)
GFR, Estimated: 19 mL/min — ABNORMAL LOW
Glucose, Bld: 402 mg/dL — ABNORMAL HIGH (ref 70–99)
Potassium: 5.3 mmol/L — ABNORMAL HIGH (ref 3.5–5.1)
Sodium: 130 mmol/L — ABNORMAL LOW (ref 135–145)
Total Bilirubin: 0.4 mg/dL (ref 0.0–1.2)
Total Protein: 6.8 g/dL (ref 6.5–8.1)

## 2024-07-03 LAB — CBC
HCT: 37.1 % (ref 36.0–46.0)
Hemoglobin: 11.7 g/dL — ABNORMAL LOW (ref 12.0–15.0)
MCH: 29.6 pg (ref 26.0–34.0)
MCHC: 31.5 g/dL (ref 30.0–36.0)
MCV: 93.9 fL (ref 80.0–100.0)
Platelets: 124 10*3/uL — ABNORMAL LOW (ref 150–400)
RBC: 3.95 MIL/uL (ref 3.87–5.11)
RDW: 13.4 % (ref 11.5–15.5)
WBC: 31.1 10*3/uL — ABNORMAL HIGH (ref 4.0–10.5)
nRBC: 0 % (ref 0.0–0.2)

## 2024-07-03 LAB — URINALYSIS, ROUTINE W REFLEX MICROSCOPIC
Bilirubin Urine: NEGATIVE
Glucose, UA: NEGATIVE mg/dL
Ketones, ur: NEGATIVE mg/dL
Nitrite: NEGATIVE
Protein, ur: 100 mg/dL — AB
RBC / HPF: >50 RBC/hpf (ref 0–5)
Specific Gravity, Urine: 1.016 (ref 1.005–1.030)
WBC, UA: >50 WBC/hpf (ref 0–5)
pH: 5 (ref 5.0–8.0)

## 2024-07-03 LAB — TROPONIN T, HIGH SENSITIVITY
Troponin T High Sensitivity: 32 ng/L — ABNORMAL HIGH (ref 0–19)
Troponin T High Sensitivity: 26 ng/L — ABNORMAL HIGH (ref 0–19)

## 2024-07-03 LAB — ETHANOL: Alcohol, Ethyl (B): <15 mg/dL

## 2024-07-03 LAB — I-STAT CG4 LACTIC ACID, ED
Lactic Acid, Venous: 1.3 mmol/L (ref 0.5–1.9)
Lactic Acid, Venous: 1.1 mmol/L (ref 0.5–1.9)

## 2024-07-03 MED ORDER — MORPHINE SULFATE (PF) 2 MG/ML IV SOLN
2.0000 mg | Freq: Once | INTRAVENOUS | Status: AC
  Administered 2024-07-03: 2 mg via INTRAVENOUS
  Filled 2024-07-03: qty 1

## 2024-07-03 MED ORDER — INSULIN ASPART 100 UNIT/ML IJ SOLN
6.0000 [IU] | Freq: Once | INTRAMUSCULAR | Status: AC
  Administered 2024-07-03: 6 [IU] via SUBCUTANEOUS
  Filled 2024-07-03: qty 6

## 2024-07-03 MED ORDER — LACTATED RINGERS IV SOLN: INTRAVENOUS | Status: DC

## 2024-07-03 MED ORDER — LACTATED RINGERS IV BOLUS (SEPSIS)
1000.0000 mL | Freq: Once | INTRAVENOUS | Status: AC
  Administered 2024-07-03: 1000 mL via INTRAVENOUS

## 2024-07-03 MED ORDER — SODIUM CHLORIDE 0.9 % IV BOLUS
500.0000 mL | Freq: Once | INTRAVENOUS | Status: AC
  Administered 2024-07-03: 500 mL via INTRAVENOUS

## 2024-07-03 MED ORDER — GADOBUTROL 1 MMOL/ML IV SOLN
6.0000 mL | Freq: Once | INTRAVENOUS | Status: AC | PRN
  Administered 2024-07-03: 6 mL via INTRAVENOUS

## 2024-07-03 MED ORDER — VANCOMYCIN HCL IN DEXTROSE 1-5 GM/200ML-% IV SOLN: 1000.0000 mg | Freq: Once | INTRAVENOUS | Status: DC

## 2024-07-03 MED ORDER — SODIUM CHLORIDE 0.9 % IV SOLN
2.0000 g | Freq: Once | INTRAVENOUS | Status: AC
  Administered 2024-07-03: 2 g via INTRAVENOUS
  Filled 2024-07-03: qty 10

## 2024-07-03 MED ORDER — LINEZOLID 600 MG/300ML IV SOLN
600.0000 mg | Freq: Once | INTRAVENOUS | Status: AC
  Administered 2024-07-03: 600 mg via INTRAVENOUS
  Filled 2024-07-03: qty 300

## 2024-07-03 MED ORDER — INSULIN ASPART 100 UNIT/ML IJ SOLN: 4.0000 [IU] | Freq: Once | INTRAMUSCULAR | Status: DC

## 2024-07-03 MED ORDER — METRONIDAZOLE 500 MG/100ML IV SOLN
500.0000 mg | Freq: Once | INTRAVENOUS | Status: AC
  Administered 2024-07-03: 500 mg via INTRAVENOUS
  Filled 2024-07-03: qty 100

## 2024-07-03 NOTE — ED Triage Notes (Signed)
 Patient has history of vertigo and has been having issues for past week, and meds not effective. Patient is now weak, vomited 2 days ago, and is not eating or drinking. Patient is sleeping. Patient also complains of back pain for past week, rates pain 8/10.

## 2024-07-03 NOTE — Progress Notes (Signed)
 Elink following for sepsis protocol.

## 2024-07-03 NOTE — ED Notes (Signed)
 Called CCMD at [phone] to initiate cardiac monitoring as ordered.

## 2024-07-03 NOTE — ED Provider Notes (Signed)
 " [place] EMERGENCY DEPARTMENT AT [facility] Provider Note   CSN: [id number] Arrival date & time: 07/03/24  1540     Patient presents with: No chief complaint on file.   Rachael Burke is a 69 y.o. female.  {Add pertinent medical, surgical, social history, OB history to HPI:6462} HPI  69 year old female with a history of end-stage renal disease status post renal transplant in July 2024, diabetes, hypertension, constipation, dizziness presents with 1 week of her chronic vertigo symptoms, vomited 2 days ago, not eating or drinking well and feeling generalized weakness.    Prior to Admission medications  Medication Sig Start Date End Date Taking? Authorizing Provider  acetaminophen  (TYLENOL ) 325 MG tablet Take 2 tablets (650 mg total) by mouth every 6 (six) hours as needed for mild pain or moderate pain. 09/05/21   Geiple, Joshua, PA-C  amLODipine  (NORVASC ) 10 MG tablet Take 1 tablet by mouth daily.    [provider]  atorvastatin  (LIPITOR) 20 MG tablet TAKE 1 TABLET BY MOUTH EVERY DAY 06/06/23   Paseda, Folashade R, FNP  calcium  acetate (PHOSLO) 667 MG capsule Take 667 mg by mouth 3 (three) times daily. 04/02/21   [provider]  carvedilol  (COREG ) 25 MG tablet Take 25 mg by mouth 2 (two) times daily.    [provider]  gabapentin  (NEURONTIN ) 100 MG capsule Take 300 mg by mouth at bedtime. 09/03/21   [provider]  hydrALAZINE  (APRESOLINE ) 25 MG tablet Take 25 mg by mouth 2 (two) times daily. 12/18/22   [provider]  HYDROcodone -acetaminophen  (NORCO/VICODIN) 5-325 MG tablet Take 1 tablet by mouth every 6 (six) hours as needed for severe pain. 10/07/22   Garrick Charleston, MD  meclizine  (ANTIVERT ) 12.5 MG tablet Take 1 tablet (12.5 mg total) by mouth 2 (two) times daily as needed for dizziness. 06/26/22   Leigh Venetia CROME, MD  nitroGLYCERIN  (NITROSTAT ) 0.4 MG SL tablet Place 1 tablet (0.4 mg total) under the tongue every 5  (five) minutes as needed for chest pain. 01/09/22 04/20/24  Custovic, Sabina, DO  omeprazole (PRILOSEC OTC) 20 MG tablet Take 20 mg by mouth daily as needed (indigestion).    [provider]  ondansetron  (ZOFRAN ) 4 MG tablet Take 1 tablet (4 mg total) by mouth every 8 (eight) hours as needed for nausea or vomiting. 10/28/22   Nivia Colon, PA-C  polyethylene glycol (MIRALAX  / GLYCOLAX ) 17 g packet Take 17 g by mouth daily.    [provider]  predniSONE (DELTASONE) 5 MG tablet Take 5 mg by mouth daily. 11/25/22   [provider]  tacrolimus  (PROGRAF ) 1 MG capsule Take 2 mg by mouth 2 (two) times daily. 02/05/23   [provider]  TRADJENTA  5 MG TABS tablet Take 5 mg by mouth every morning.    [provider]    Allergies: Ceftriaxone     Review of Systems Positive vertigo Updated Vital Signs BP (!) 103/51 (BP Location: Left Arm)   Pulse 72   Temp 98.9 F (37.2 C) (Oral)   Resp 16   SpO2 99%   Physical Exam  (all labs ordered are listed, but only abnormal results are displayed) Labs Reviewed  COMPREHENSIVE METABOLIC PANEL WITH GFR  CBC  URINALYSIS, ROUTINE W REFLEX MICROSCOPIC    EKG: None  Radiology: No results found.  {Document cardiac monitor, telemetry assessment procedure when appropriate:32947} Procedures   Medications Ordered in the ED - No data to display    {Click here  for ABCD2, HEART and other calculators REFRESH Note before signing:1}                              Medical Decision Making Amount and/or Complexity of Data Reviewed Labs: ordered. Radiology: ordered.  Risk Prescription drug management.   Accepted to [facility] by Dr Theodoro  {Document critical care time when appropriate  Document review of labs and clinical decision tools ie [id number], etc  Document your independent review of radiology images and any outside records  Document your discussion with family members, caretakers and with consultants   Document social determinants of health affecting pt's care  Document your decision making why or why not admission, treatments were needed:32947:::1}   Final diagnoses:  None    ED Discharge Orders     None        "

## 2024-07-03 NOTE — ED Triage Notes (Signed)
 Patient is post kidney transplant one year ago. Daughter also states she had a hard time using the bathroom this am, no eating, sleeping, and weak.

## 2024-07-04 LAB — BLOOD CULTURE ID PANEL (REFLEXED) - BCID2

## 2024-07-04 NOTE — ED Notes (Signed)
 07/05/24 1140 Writer spoke with daughter Claudette Button who confirmed pt is still being treated at [facility]. Daughter is aware pt has + blood cultures.

## 2024-07-06 LAB — CULTURE, BLOOD (SINGLE): Special Requests: ADEQUATE

## 2024-07-20 ENCOUNTER — Ambulatory Visit: Admitting: Podiatry
